# Patient Record
Sex: Female | Born: 1945 | Race: White | Hispanic: No | Smoking: Former smoker
Health system: Southern US, Community
[De-identification: ages and names within clinical notes are randomized; demographics above are authoritative.]

## PROBLEM LIST (undated history)

## (undated) DIAGNOSIS — I480 Paroxysmal atrial fibrillation: Secondary | ICD-10-CM

## (undated) DIAGNOSIS — I484 Atypical atrial flutter: Secondary | ICD-10-CM

## (undated) DIAGNOSIS — Z9889 Other specified postprocedural states: Secondary | ICD-10-CM

## (undated) DIAGNOSIS — I471 Supraventricular tachycardia, unspecified: Secondary | ICD-10-CM

## (undated) DIAGNOSIS — R928 Other abnormal and inconclusive findings on diagnostic imaging of breast: Secondary | ICD-10-CM

## (undated) DIAGNOSIS — F419 Anxiety disorder, unspecified: Secondary | ICD-10-CM

## (undated) DIAGNOSIS — I059 Rheumatic mitral valve disease, unspecified: Secondary | ICD-10-CM

## (undated) DIAGNOSIS — I639 Cerebral infarction, unspecified: Secondary | ICD-10-CM

## (undated) DIAGNOSIS — N6009 Solitary cyst of unspecified breast: Secondary | ICD-10-CM

## (undated) HISTORY — DX: Other specified postprocedural states: Z98.890

## (undated) HISTORY — DX: Paroxysmal atrial fibrillation: I48.0

## (undated) HISTORY — DX: Other abnormal and inconclusive findings on diagnostic imaging of breast: R92.8

## (undated) HISTORY — DX: Supraventricular tachycardia, unspecified: I47.10

## (undated) HISTORY — DX: Atypical atrial flutter: I48.4

## (undated) HISTORY — PX: BREAST CYST ASPIRATION: SHX578

## (undated) HISTORY — DX: Supraventricular tachycardia: I47.1

---

## 2002-07-27 HISTORY — PX: ABDOMINAL HYSTERECTOMY: SHX81

## 2004-08-27 ENCOUNTER — Ambulatory Visit: Payer: Self-pay | Admitting: Unknown Physician Specialty

## 2005-07-27 HISTORY — PX: EYE SURGERY: SHX253

## 2009-09-09 ENCOUNTER — Ambulatory Visit: Payer: Self-pay | Admitting: Ophthalmology

## 2011-07-01 ENCOUNTER — Observation Stay: Payer: Self-pay | Admitting: Internal Medicine

## 2011-08-25 ENCOUNTER — Ambulatory Visit: Payer: Self-pay | Admitting: Family Medicine

## 2012-05-28 ENCOUNTER — Emergency Department: Payer: Self-pay | Admitting: Emergency Medicine

## 2012-05-28 LAB — BASIC METABOLIC PANEL WITH GFR
Anion Gap: 8
BUN: 10 mg/dL
Calcium, Total: 9.3 mg/dL
Chloride: 103 mmol/L
Co2: 27 mmol/L
Creatinine: 0.75 mg/dL
EGFR (African American): >60
EGFR (Non-African Amer.): >60
Glucose: 131 mg/dL — ABNORMAL HIGH
Osmolality: 277
Potassium: 3.4 mmol/L — ABNORMAL LOW
Sodium: 138 mmol/L

## 2012-05-28 LAB — CK TOTAL AND CKMB (NOT AT ARMC)
CK, Total: 59 U/L
CK-MB: <0.5 ng/mL — ABNORMAL LOW

## 2012-05-28 LAB — CBC WITH DIFFERENTIAL/PLATELET
Eosinophil %: 0.1 %
HCT: 47.2 % — ABNORMAL HIGH (ref 35.0–47.0)
Lymphocyte #: 1.2 10*3/uL (ref 1.0–3.6)
MCH: 29.1 pg (ref 26.0–34.0)
MCHC: 34 g/dL (ref 32.0–36.0)
MCV: 86 fL (ref 80–100)
Monocyte #: 1.3 x10 3/mm — ABNORMAL HIGH (ref 0.2–0.9)
Monocyte %: 9.9 %
Neutrophil #: 10.4 10*3/uL — ABNORMAL HIGH (ref 1.4–6.5)
Platelet: 188 10*3/uL (ref 150–440)
RDW: 13.8 % (ref 11.5–14.5)
WBC: 12.9 10*3/uL — ABNORMAL HIGH (ref 3.6–11.0)

## 2012-05-28 LAB — TROPONIN I: Troponin-I: <0.02 ng/mL

## 2012-05-30 ENCOUNTER — Emergency Department: Payer: Self-pay | Admitting: Emergency Medicine

## 2012-05-30 LAB — CBC
HCT: 43.7 % (ref 35.0–47.0)
MCHC: 34.2 g/dL (ref 32.0–36.0)
MCV: 85 fL (ref 80–100)
RBC: 5.12 10*6/uL (ref 3.80–5.20)
WBC: 6.7 10*3/uL (ref 3.6–11.0)

## 2012-05-30 LAB — BASIC METABOLIC PANEL
Anion Gap: 8 (ref 7–16)
BUN: 11 mg/dL (ref 7–18)
Calcium, Total: 8.9 mg/dL (ref 8.5–10.1)
EGFR (African American): >60
EGFR (Non-African Amer.): >60
Glucose: 99 mg/dL (ref 65–99)
Osmolality: 284 (ref 275–301)
Sodium: 143 mmol/L (ref 136–145)

## 2012-05-30 LAB — CK TOTAL AND CKMB (NOT AT ARMC): CK, Total: 42 U/L (ref 21–215)

## 2012-07-27 DIAGNOSIS — R928 Other abnormal and inconclusive findings on diagnostic imaging of breast: Secondary | ICD-10-CM

## 2012-07-27 HISTORY — DX: Other abnormal and inconclusive findings on diagnostic imaging of breast: R92.8

## 2012-09-05 ENCOUNTER — Ambulatory Visit: Payer: Self-pay | Admitting: Internal Medicine

## 2012-09-06 ENCOUNTER — Ambulatory Visit: Payer: Self-pay | Admitting: Internal Medicine

## 2012-10-08 ENCOUNTER — Encounter: Payer: Self-pay | Admitting: General Surgery

## 2012-11-23 ENCOUNTER — Encounter: Payer: Self-pay | Admitting: *Deleted

## 2012-12-12 ENCOUNTER — Ambulatory Visit: Payer: BC Managed Care – PPO | Admitting: General Surgery

## 2013-01-03 ENCOUNTER — Ambulatory Visit (INDEPENDENT_AMBULATORY_CARE_PROVIDER_SITE_OTHER): Payer: BC Managed Care – PPO | Admitting: General Surgery

## 2013-01-03 ENCOUNTER — Ambulatory Visit: Payer: BC Managed Care – PPO | Admitting: General Surgery

## 2013-01-03 ENCOUNTER — Ambulatory Visit: Payer: Self-pay

## 2013-01-03 VITALS — BP 126/72 | HR 80 | Resp 12 | Ht 69.0 in | Wt 179.0 lb

## 2013-01-03 DIAGNOSIS — N63 Unspecified lump in unspecified breast: Secondary | ICD-10-CM

## 2013-01-03 DIAGNOSIS — N6009 Solitary cyst of unspecified breast: Secondary | ICD-10-CM

## 2013-01-03 DIAGNOSIS — N6001 Solitary cyst of right breast: Secondary | ICD-10-CM

## 2013-01-03 NOTE — Progress Notes (Signed)
Patient ID: Colleen Williamson, female   DOB: [birth year], 67 y.o.   MRN: [id number]  Chief Complaint  Patient presents with  . Other    breast check    HPI Colleen Williamson is a 67 y.o. female here today for her follow up breast check. Patient had an aspiration in the right breast three months ago. No new breast problem at this time. HPI  Past Medical History  Diagnosis Date  . Abnormal mammogram, unspecified 2014    Past Surgical History  Procedure Laterality Date  . Abdominal hysterectomy  2004  . Eye surgery  2007    No family history on file.  Social History History  Substance Use Topics  . Smoking status: Not on file  . Smokeless tobacco: Never Used  . Alcohol Use: No    Allergies  Allergen Reactions  . Sulfa Antibiotics Nausea Only    Current Outpatient Prescriptions  Medication Sig Dispense Refill  . citalopram (CELEXA) 10 MG tablet Take 10 mg by mouth daily.      . meclizine (ANTIVERT) 25 MG tablet Take 1 mg by mouth daily.      . metoprolol succinate (TOPROL-XL) 25 MG 24 hr tablet Take 25 mg by mouth daily.       No current facility-administered medications for this visit.    Review of Systems Review of Systems  Constitutional: Negative.   Respiratory: Negative.   Cardiovascular: Negative.     Blood pressure 126/72, pulse 80, resp. rate 12, height 5\' 9"  (1.753 m), weight 179 lb (81.194 kg).  Physical Exam Physical Exam  Constitutional: She appears well-developed and well-nourished.  Eyes: Conjunctivae are normal. No scleral icterus.  Neck: Neck supple.  Cardiovascular: Normal rate, regular rhythm and normal heart sounds.   Pulmonary/Chest: Right breast exhibits no inverted nipple, no mass, no nipple discharge, no skin change and no tenderness. Left breast exhibits no inverted nipple, no mass, no nipple discharge, no skin change and no tenderness.  Lymphadenopathy:    She has no cervical adenopathy.    She has no axillary adenopathy.    Data  Reviewed Ultrasound examination of the right breast in the 4:00 and 9:00 position were previous lesions were aspirated showed no evidence of recurrence.  Cytology completed the time of the September 19, 2012 exam showed no evidence of malignancy or atypia.   Assessment    Benign breast exam.     Plan    The patient should resume annual examinations and mammograms with her primary care physician in spring 2015.        Earline Mayotte 01/03/2013, 7:13 PM

## 2013-01-03 NOTE — Patient Instructions (Signed)
Patient to return as needed. 

## 2013-01-04 ENCOUNTER — Encounter: Payer: Self-pay | Admitting: General Surgery

## 2013-08-31 DIAGNOSIS — R928 Other abnormal and inconclusive findings on diagnostic imaging of breast: Secondary | ICD-10-CM

## 2013-09-20 ENCOUNTER — Ambulatory Visit: Payer: Self-pay | Admitting: Internal Medicine

## 2013-11-03 ENCOUNTER — Ambulatory Visit: Payer: Self-pay | Admitting: Internal Medicine

## 2013-11-23 ENCOUNTER — Ambulatory Visit: Payer: Self-pay | Admitting: Unknown Physician Specialty

## 2013-11-23 LAB — CBC WITH DIFFERENTIAL/PLATELET
BASOS ABS: 0.1 10*3/uL (ref 0.0–0.1)
Basophil %: 1.3 %
Eosinophil #: 0.1 10*3/uL (ref 0.0–0.7)
Eosinophil %: 2.1 %
HCT: 47.1 % — AB (ref 35.0–47.0)
HGB: 15.1 g/dL (ref 12.0–16.0)
LYMPHS ABS: 1.8 10*3/uL (ref 1.0–3.6)
LYMPHS PCT: 32.6 %
MCH: 27.7 pg (ref 26.0–34.0)
MCHC: 32 g/dL (ref 32.0–36.0)
MCV: 87 fL (ref 80–100)
MONO ABS: 0.4 x10 3/mm (ref 0.2–0.9)
Monocyte %: 7.4 %
NEUTROS ABS: 3.2 10*3/uL (ref 1.4–6.5)
NEUTROS PCT: 56.6 %
PLATELETS: 239 10*3/uL (ref 150–440)
RBC: 5.44 10*6/uL — ABNORMAL HIGH (ref 3.80–5.20)
RDW: 14.4 % (ref 11.5–14.5)
WBC: 5.6 10*3/uL (ref 3.6–11.0)

## 2013-11-23 LAB — BASIC METABOLIC PANEL
Anion Gap: 5 — ABNORMAL LOW (ref 7–16)
BUN: 11 mg/dL (ref 7–18)
CALCIUM: 9 mg/dL (ref 8.5–10.1)
Chloride: 105 mmol/L (ref 98–107)
Co2: 31 mmol/L (ref 21–32)
Creatinine: 0.57 mg/dL — ABNORMAL LOW (ref 0.60–1.30)
EGFR (Non-African Amer.): >60
GLUCOSE: 94 mg/dL (ref 65–99)
Osmolality: 280 (ref 275–301)
POTASSIUM: 3.8 mmol/L (ref 3.5–5.1)
Sodium: 141 mmol/L (ref 136–145)

## 2013-11-23 LAB — PROTIME-INR
INR: 1
PROTHROMBIN TIME: 13.2 s (ref 11.5–14.7)

## 2013-11-24 LAB — PATHOLOGY REPORT

## 2013-11-29 ENCOUNTER — Ambulatory Visit: Payer: Self-pay | Admitting: Unknown Physician Specialty

## 2013-12-11 ENCOUNTER — Ambulatory Visit: Payer: Self-pay | Admitting: Surgery

## 2013-12-11 LAB — CBC WITH DIFFERENTIAL/PLATELET
Basophil #: 0.1 10*3/uL (ref 0.0–0.1)
Basophil %: 1.4 %
Eosinophil #: 0.2 10*3/uL (ref 0.0–0.7)
Eosinophil %: 3.2 %
HCT: 44.1 % (ref 35.0–47.0)
HGB: 14.4 g/dL (ref 12.0–16.0)
LYMPHS PCT: 32.2 %
Lymphocyte #: 2 10*3/uL (ref 1.0–3.6)
MCH: 28 pg (ref 26.0–34.0)
MCHC: 32.7 g/dL (ref 32.0–36.0)
MCV: 86 fL (ref 80–100)
Monocyte #: 0.5 x10 3/mm (ref 0.2–0.9)
Monocyte %: 8.7 %
Neutrophil #: 3.4 10*3/uL (ref 1.4–6.5)
Neutrophil %: 54.5 %
Platelet: 228 10*3/uL (ref 150–440)
RBC: 5.13 10*6/uL (ref 3.80–5.20)
RDW: 14.1 % (ref 11.5–14.5)
WBC: 6.2 10*3/uL (ref 3.6–11.0)

## 2013-12-14 ENCOUNTER — Inpatient Hospital Stay: Payer: Self-pay | Admitting: Surgery

## 2013-12-15 LAB — PATHOLOGY REPORT

## 2013-12-16 LAB — PLATELET COUNT: Platelet: 189 10*3/uL (ref 150–440)

## 2014-04-12 DIAGNOSIS — I4892 Unspecified atrial flutter: Secondary | ICD-10-CM | POA: Diagnosis present

## 2014-04-20 DIAGNOSIS — M79609 Pain in unspecified limb: Secondary | ICD-10-CM | POA: Insufficient documentation

## 2014-04-25 ENCOUNTER — Ambulatory Visit: Payer: Self-pay | Admitting: Internal Medicine

## 2014-05-14 DIAGNOSIS — I480 Paroxysmal atrial fibrillation: Secondary | ICD-10-CM | POA: Insufficient documentation

## 2014-05-27 HISTORY — PX: MITRAL VALVE REPAIR: SHX2039

## 2014-05-28 ENCOUNTER — Encounter: Payer: Self-pay | Admitting: *Deleted

## 2014-06-13 DIAGNOSIS — I341 Nonrheumatic mitral (valve) prolapse: Secondary | ICD-10-CM | POA: Insufficient documentation

## 2014-07-03 ENCOUNTER — Inpatient Hospital Stay (HOSPITAL_COMMUNITY)
Admission: EM | Admit: 2014-07-03 | Discharge: 2014-07-09 | DRG: 023 | Disposition: A | Discharge status: Home Health | Payer: Medicare HMO | Attending: Neurology | Admitting: Neurology

## 2014-07-03 ENCOUNTER — Emergency Department (HOSPITAL_COMMUNITY): Payer: Medicare HMO

## 2014-07-03 ENCOUNTER — Emergency Department (HOSPITAL_COMMUNITY): Payer: Medicare HMO | Admitting: Anesthesiology

## 2014-07-03 ENCOUNTER — Inpatient Hospital Stay (HOSPITAL_COMMUNITY): Payer: Medicare HMO

## 2014-07-03 ENCOUNTER — Encounter (HOSPITAL_COMMUNITY)
Admission: EM | Disposition: A | Discharge status: Home Health | Payer: Self-pay | Source: Home / Self Care | Attending: Neurology

## 2014-07-03 ENCOUNTER — Encounter (HOSPITAL_COMMUNITY): Payer: Self-pay | Admitting: Emergency Medicine

## 2014-07-03 DIAGNOSIS — I482 Chronic atrial fibrillation: Secondary | ICD-10-CM

## 2014-07-03 DIAGNOSIS — Z7982 Long term (current) use of aspirin: Secondary | ICD-10-CM

## 2014-07-03 DIAGNOSIS — R4701 Aphasia: Secondary | ICD-10-CM | POA: Diagnosis present

## 2014-07-03 DIAGNOSIS — Z978 Presence of other specified devices: Secondary | ICD-10-CM

## 2014-07-03 DIAGNOSIS — I639 Cerebral infarction, unspecified: Secondary | ICD-10-CM | POA: Diagnosis present

## 2014-07-03 DIAGNOSIS — J9690 Respiratory failure, unspecified, unspecified whether with hypoxia or hypercapnia: Secondary | ICD-10-CM

## 2014-07-03 DIAGNOSIS — I059 Rheumatic mitral valve disease, unspecified: Secondary | ICD-10-CM

## 2014-07-03 DIAGNOSIS — J9600 Acute respiratory failure, unspecified whether with hypoxia or hypercapnia: Secondary | ICD-10-CM | POA: Diagnosis not present

## 2014-07-03 DIAGNOSIS — W19XXXA Unspecified fall, initial encounter: Secondary | ICD-10-CM | POA: Diagnosis present

## 2014-07-03 DIAGNOSIS — Z87891 Personal history of nicotine dependence: Secondary | ICD-10-CM

## 2014-07-03 DIAGNOSIS — I480 Paroxysmal atrial fibrillation: Secondary | ICD-10-CM | POA: Diagnosis present

## 2014-07-03 DIAGNOSIS — E162 Hypoglycemia, unspecified: Secondary | ICD-10-CM | POA: Diagnosis not present

## 2014-07-03 DIAGNOSIS — G8191 Hemiplegia, unspecified affecting right dominant side: Secondary | ICD-10-CM | POA: Diagnosis present

## 2014-07-03 DIAGNOSIS — E875 Hyperkalemia: Secondary | ICD-10-CM | POA: Diagnosis present

## 2014-07-03 DIAGNOSIS — I4892 Unspecified atrial flutter: Secondary | ICD-10-CM | POA: Diagnosis present

## 2014-07-03 DIAGNOSIS — F329 Major depressive disorder, single episode, unspecified: Secondary | ICD-10-CM | POA: Diagnosis present

## 2014-07-03 DIAGNOSIS — J9601 Acute respiratory failure with hypoxia: Secondary | ICD-10-CM

## 2014-07-03 DIAGNOSIS — H53461 Homonymous bilateral field defects, right side: Secondary | ICD-10-CM | POA: Diagnosis present

## 2014-07-03 DIAGNOSIS — I4891 Unspecified atrial fibrillation: Secondary | ICD-10-CM

## 2014-07-03 DIAGNOSIS — I63412 Cerebral infarction due to embolism of left middle cerebral artery: Principal | ICD-10-CM | POA: Diagnosis present

## 2014-07-03 DIAGNOSIS — Z0000 Encounter for general adult medical examination without abnormal findings: Secondary | ICD-10-CM

## 2014-07-03 DIAGNOSIS — I10 Essential (primary) hypertension: Secondary | ICD-10-CM | POA: Diagnosis present

## 2014-07-03 DIAGNOSIS — I63032 Cerebral infarction due to thrombosis of left carotid artery: Secondary | ICD-10-CM

## 2014-07-03 DIAGNOSIS — Z952 Presence of prosthetic heart valve: Secondary | ICD-10-CM

## 2014-07-03 DIAGNOSIS — I63312 Cerebral infarction due to thrombosis of left middle cerebral artery: Secondary | ICD-10-CM

## 2014-07-03 HISTORY — DX: Solitary cyst of unspecified breast: N60.09

## 2014-07-03 HISTORY — DX: Cerebral infarction, unspecified: I63.9

## 2014-07-03 HISTORY — PX: RADIOLOGY WITH ANESTHESIA: SHX6223

## 2014-07-03 LAB — COMPREHENSIVE METABOLIC PANEL
ALT: 14 U/L (ref 0–35)
ANION GAP: 14 (ref 5–15)
AST: 20 U/L (ref 0–37)
Albumin: 3.7 g/dL (ref 3.5–5.2)
Alkaline Phosphatase: 120 U/L — ABNORMAL HIGH (ref 39–117)
BUN: 15 mg/dL (ref 6–23)
CALCIUM: 9.3 mg/dL (ref 8.4–10.5)
CO2: 22 mEq/L (ref 19–32)
Chloride: 105 mEq/L (ref 96–112)
Creatinine, Ser: 0.66 mg/dL (ref 0.50–1.10)
GFR calc Af Amer: >90 mL/min (ref >90)
GFR calc non Af Amer: >90 mL/min (ref >90)
Glucose, Bld: 103 mg/dL — ABNORMAL HIGH (ref 70–99)
Potassium: 3.8 mEq/L (ref 3.7–5.3)
Sodium: 141 mEq/L (ref 137–147)
Total Bilirubin: 1.1 mg/dL (ref 0.3–1.2)
Total Protein: 6.6 g/dL (ref 6.0–8.3)

## 2014-07-03 LAB — CBC
HEMATOCRIT: 37.1 % (ref 36.0–46.0)
HEMOGLOBIN: 11.9 g/dL — AB (ref 12.0–15.0)
MCH: 26.7 pg (ref 26.0–34.0)
MCHC: 32.1 g/dL (ref 30.0–36.0)
MCV: 83.4 fL (ref 78.0–100.0)
Platelets: 201 10*3/uL (ref 150–400)
RBC: 4.45 MIL/uL (ref 3.87–5.11)
RDW: 14 % (ref 11.5–15.5)
WBC: 5.9 10*3/uL (ref 4.0–10.5)

## 2014-07-03 LAB — I-STAT TROPONIN, ED: TROPONIN I, POC: 0 ng/mL (ref 0.00–0.08)

## 2014-07-03 LAB — RAPID URINE DRUG SCREEN, HOSP PERFORMED
Amphetamines: NOT DETECTED
Barbiturates: NOT DETECTED
Benzodiazepines: NOT DETECTED
COCAINE: NOT DETECTED
Opiates: NOT DETECTED
TETRAHYDROCANNABINOL: NOT DETECTED

## 2014-07-03 LAB — POCT I-STAT 7, (LYTES, BLD GAS, ICA,H+H)
Acid-base deficit: 5 mmol/L — ABNORMAL HIGH (ref 0.0–2.0)
Bicarbonate: 19.4 mEq/L — ABNORMAL LOW (ref 20.0–24.0)
Calcium, Ion: 1.12 mmol/L — ABNORMAL LOW (ref 1.13–1.30)
HEMATOCRIT: 29 % — AB (ref 36.0–46.0)
HEMOGLOBIN: 9.9 g/dL — AB (ref 12.0–15.0)
O2 Saturation: 98 %
PH ART: 7.378 (ref 7.350–7.450)
PO2 ART: 110 mmHg — AB (ref 80.0–100.0)
Potassium: 2.9 mEq/L — CL (ref 3.7–5.3)
SODIUM: 146 meq/L (ref 137–147)
TCO2: 20 mmol/L (ref 0–100)
pCO2 arterial: 33 mmHg — ABNORMAL LOW (ref 35.0–45.0)

## 2014-07-03 LAB — I-STAT CHEM 8, ED
BUN: 14 mg/dL (ref 6–23)
Calcium, Ion: 1.18 mmol/L (ref 1.13–1.30)
Chloride: 108 mEq/L (ref 96–112)
Creatinine, Ser: 0.7 mg/dL (ref 0.50–1.10)
Glucose, Bld: 104 mg/dL — ABNORMAL HIGH (ref 70–99)
HCT: 39 % (ref 36.0–46.0)
HEMOGLOBIN: 13.3 g/dL (ref 12.0–15.0)
Potassium: 3.7 mEq/L (ref 3.7–5.3)
Sodium: 142 mEq/L (ref 137–147)
TCO2: 21 mmol/L (ref 0–100)

## 2014-07-03 LAB — URINALYSIS, ROUTINE W REFLEX MICROSCOPIC
BILIRUBIN URINE: NEGATIVE
Glucose, UA: NEGATIVE mg/dL
HGB URINE DIPSTICK: NEGATIVE
Ketones, ur: NEGATIVE mg/dL
Leukocytes, UA: NEGATIVE
Nitrite: NEGATIVE
PH: 8 (ref 5.0–8.0)
Protein, ur: 100 mg/dL — AB
Specific Gravity, Urine: 1.039 — ABNORMAL HIGH (ref 1.005–1.030)
UROBILINOGEN UA: 0.2 mg/dL (ref 0.0–1.0)

## 2014-07-03 LAB — BASIC METABOLIC PANEL
Anion gap: 13 (ref 5–15)
BUN: 12 mg/dL (ref 6–23)
CALCIUM: 8.9 mg/dL (ref 8.4–10.5)
CO2: 22 mEq/L (ref 19–32)
CREATININE: 0.58 mg/dL (ref 0.50–1.10)
Chloride: 108 mEq/L (ref 96–112)
GFR calc Af Amer: >90 mL/min (ref >90)
GLUCOSE: 98 mg/dL (ref 70–99)
POTASSIUM: 3.5 meq/L — AB (ref 3.7–5.3)
Sodium: 143 mEq/L (ref 137–147)

## 2014-07-03 LAB — PHOSPHORUS: PHOSPHORUS: 3.2 mg/dL (ref 2.3–4.6)

## 2014-07-03 LAB — DIFFERENTIAL
BASOS ABS: 0.1 10*3/uL (ref 0.0–0.1)
BASOS PCT: 1 % (ref 0–1)
EOS PCT: 7 % — AB (ref 0–5)
Eosinophils Absolute: 0.4 10*3/uL (ref 0.0–0.7)
Lymphocytes Relative: 25 % (ref 12–46)
Lymphs Abs: 1.5 10*3/uL (ref 0.7–4.0)
MONO ABS: 0.4 10*3/uL (ref 0.1–1.0)
MONOS PCT: 7 % (ref 3–12)
Neutro Abs: 3.5 10*3/uL (ref 1.7–7.7)
Neutrophils Relative %: 60 % (ref 43–77)

## 2014-07-03 LAB — PROTIME-INR
INR: 1.07 (ref 0.00–1.49)
Prothrombin Time: 14 seconds (ref 11.6–15.2)

## 2014-07-03 LAB — GLUCOSE, CAPILLARY
Glucose-Capillary: 100 mg/dL — ABNORMAL HIGH (ref 70–99)
Glucose-Capillary: 95 mg/dL (ref 70–99)
Glucose-Capillary: 83 mg/dL (ref 70–99)

## 2014-07-03 LAB — URINE MICROSCOPIC-ADD ON

## 2014-07-03 LAB — CBG MONITORING, ED: GLUCOSE-CAPILLARY: 108 mg/dL — AB (ref 70–99)

## 2014-07-03 LAB — ETHANOL

## 2014-07-03 LAB — APTT: aPTT: 26 seconds (ref 24–37)

## 2014-07-03 LAB — TROPONIN I
Troponin I: <0.3 ng/mL (ref <0.30)
Troponin I: <0.3 ng/mL (ref <0.30)

## 2014-07-03 LAB — MAGNESIUM: Magnesium: 2 mg/dL (ref 1.5–2.5)

## 2014-07-03 LAB — MRSA PCR SCREENING: MRSA by PCR: NEGATIVE

## 2014-07-03 LAB — TRIGLYCERIDES: TRIGLYCERIDES: 148 mg/dL (ref <150)

## 2014-07-03 SURGERY — RADIOLOGY WITH ANESTHESIA
Anesthesia: General

## 2014-07-03 MED ORDER — SODIUM CHLORIDE 0.9 % IV SOLN
10.0000 mg | INTRAVENOUS | Status: DC | PRN
  Administered 2014-07-03: 25 ug/min via INTRAVENOUS

## 2014-07-03 MED ORDER — PROPOFOL 10 MG/ML IV EMUL
0.0000 ug/kg/min | INTRAVENOUS | Status: DC
  Administered 2014-07-03 (×2): 50 ug/kg/min via INTRAVENOUS
  Administered 2014-07-03: 40 ug/kg/min via INTRAVENOUS
  Administered 2014-07-03: 1000 mg via INTRAVENOUS
  Administered 2014-07-04: 10 ug/kg/min via INTRAVENOUS
  Administered 2014-07-04 (×3): 50 ug/kg/min via INTRAVENOUS
  Administered 2014-07-05: 10 ug/kg/min via INTRAVENOUS
  Filled 2014-07-03 (×8): qty 100

## 2014-07-03 MED ORDER — HEPARIN SODIUM (PORCINE) 1000 UNIT/ML IJ SOLN
INTRAMUSCULAR | Status: DC | PRN
  Administered 2014-07-03: 2000 [IU] via INTRAVENOUS

## 2014-07-03 MED ORDER — POTASSIUM CHLORIDE 20 MEQ/15ML (10%) PO SOLN
40.0000 meq | ORAL | Status: AC
  Administered 2014-07-03 (×2): 40 meq via ORAL
  Filled 2014-07-03 (×4): qty 30

## 2014-07-03 MED ORDER — ONDANSETRON HCL 4 MG/2ML IJ SOLN: 4.0000 mg | Freq: Four times a day (QID) | INTRAMUSCULAR | Status: DC | PRN

## 2014-07-03 MED ORDER — EPTIFIBATIDE 2 MG/ML IV SOLN
INTRAVENOUS | Status: AC
  Filled 2014-07-03: qty 10

## 2014-07-03 MED ORDER — IOHEXOL 350 MG/ML SOLN
50.0000 mL | Freq: Once | INTRAVENOUS | Status: AC | PRN
  Administered 2014-07-03: 50 mL via INTRAVENOUS

## 2014-07-03 MED ORDER — SUCCINYLCHOLINE CHLORIDE 20 MG/ML IJ SOLN
INTRAMUSCULAR | Status: DC | PRN
  Administered 2014-07-03: 100 mg via INTRAVENOUS

## 2014-07-03 MED ORDER — POTASSIUM CHLORIDE 20 MEQ PO PACK
40.0000 meq | PACK | ORAL | Status: DC
Start: 2014-07-03 — End: 2014-07-03

## 2014-07-03 MED ORDER — ACETAMINOPHEN 650 MG RE SUPP
650.0000 mg | Freq: Four times a day (QID) | RECTAL | Status: DC | PRN
  Administered 2014-07-04: 650 mg via RECTAL
  Filled 2014-07-03: qty 1

## 2014-07-03 MED ORDER — NITROGLYCERIN 1 MG/10 ML FOR IR/CATH LAB
INTRA_ARTERIAL | Status: AC
  Filled 2014-07-03: qty 10

## 2014-07-03 MED ORDER — LACTATED RINGERS IV SOLN
INTRAVENOUS | Status: DC | PRN
  Administered 2014-07-03: 09:00:00 via INTRAVENOUS

## 2014-07-03 MED ORDER — FENTANYL CITRATE 0.05 MG/ML IJ SOLN
INTRAMUSCULAR | Status: AC
  Filled 2014-07-03: qty 2

## 2014-07-03 MED ORDER — EPTIFIBATIDE 2 MG/ML IV SOLN
INTRAVENOUS | Status: AC
  Administered 2014-07-03 (×3): 2 mg
  Filled 2014-07-03: qty 10

## 2014-07-03 MED ORDER — SODIUM CHLORIDE 0.9 % IV SOLN
INTRAVENOUS | Status: DC
  Administered 2014-07-03 (×2): via INTRAVENOUS

## 2014-07-03 MED ORDER — PROPOFOL 10 MG/ML IV BOLUS
INTRAVENOUS | Status: DC | PRN
  Administered 2014-07-03: 50 mg via INTRAVENOUS
  Administered 2014-07-03: 25 mg via INTRAVENOUS
  Administered 2014-07-03: 125 mg via INTRAVENOUS

## 2014-07-03 MED ORDER — IOHEXOL 300 MG/ML  SOLN
150.0000 mL | Freq: Once | INTRAMUSCULAR | Status: AC | PRN
  Administered 2014-07-03: 150 mL via INTRA_ARTERIAL

## 2014-07-03 MED ORDER — ACETAMINOPHEN 500 MG PO TABS: 1000.0000 mg | ORAL_TABLET | Freq: Four times a day (QID) | ORAL | Status: DC | PRN

## 2014-07-03 MED ORDER — ROCURONIUM BROMIDE 100 MG/10ML IV SOLN
INTRAVENOUS | Status: DC | PRN
  Administered 2014-07-03: 40 mg via INTRAVENOUS

## 2014-07-03 MED ORDER — MIDAZOLAM HCL 2 MG/2ML IJ SOLN
INTRAMUSCULAR | Status: AC
  Filled 2014-07-03: qty 2

## 2014-07-03 MED ORDER — FENTANYL CITRATE 0.05 MG/ML IJ SOLN
INTRAMUSCULAR | Status: DC
Start: 2014-07-03 — End: 2014-07-03
  Filled 2014-07-03: qty 2

## 2014-07-03 MED ORDER — SODIUM CHLORIDE 0.9 % IV SOLN
INTRAVENOUS | Status: DC | PRN
  Administered 2014-07-03: 09:00:00 via INTRAVENOUS

## 2014-07-03 MED ORDER — NICARDIPINE HCL IN NACL 20-0.86 MG/200ML-% IV SOLN
5.0000 mg/h | INTRAVENOUS | Status: DC
Start: 2014-07-03 — End: 2014-07-06
  Administered 2014-07-03: 5 mg/h via INTRAVENOUS
  Filled 2014-07-03: qty 200

## 2014-07-03 MED ORDER — CETYLPYRIDINIUM CHLORIDE 0.05 % MT LIQD
7.0000 mL | Freq: Four times a day (QID) | OROMUCOSAL | Status: DC
  Administered 2014-07-04 – 2014-07-06 (×9): 7 mL via OROMUCOSAL

## 2014-07-03 MED ORDER — STROKE: EARLY STAGES OF RECOVERY BOOK
Freq: Once | Status: AC
  Administered 2014-07-03: 12:00:00
  Filled 2014-07-03: qty 1

## 2014-07-03 MED ORDER — LIDOCAINE HCL (CARDIAC) 20 MG/ML IV SOLN
INTRAVENOUS | Status: DC | PRN
  Administered 2014-07-03: 50 mg via INTRAVENOUS

## 2014-07-03 MED ORDER — CHLORHEXIDINE GLUCONATE 0.12 % MT SOLN
15.0000 mL | Freq: Two times a day (BID) | OROMUCOSAL | Status: DC
  Administered 2014-07-03 – 2014-07-06 (×5): 15 mL via OROMUCOSAL
  Filled 2014-07-03 (×4): qty 15

## 2014-07-03 MED ORDER — FENTANYL CITRATE 0.05 MG/ML IJ SOLN
50.0000 ug | INTRAMUSCULAR | Status: DC | PRN
  Administered 2014-07-04: 50 ug via INTRAVENOUS
  Filled 2014-07-03: qty 2

## 2014-07-03 MED ORDER — ARTIFICIAL TEARS OP OINT
TOPICAL_OINTMENT | OPHTHALMIC | Status: DC | PRN
  Administered 2014-07-03: 1 via OPHTHALMIC

## 2014-07-03 MED ORDER — FENTANYL CITRATE 0.05 MG/ML IJ SOLN
INTRAMUSCULAR | Status: DC | PRN
  Administered 2014-07-03: 100 ug via INTRAVENOUS
  Administered 2014-07-03: 50 ug via INTRAVENOUS
  Administered 2014-07-03: 100 ug via INTRAVENOUS

## 2014-07-03 MED ORDER — PROPOFOL 10 MG/ML IV EMUL
INTRAVENOUS | Status: AC
  Administered 2014-07-03: 1000 mg via INTRAVENOUS
  Filled 2014-07-03: qty 100

## 2014-07-03 NOTE — ED Notes (Signed)
Dr. Nicole Kindred (Neuro) at bedside speaking with family

## 2014-07-03 NOTE — Consult Note (Signed)
Name: Colleen Williamson MRN: [id number] DOB: 10-22-1945    ADMISSION DATE:  07/03/2014 CONSULTATION DATE:  07/03/14  REFERRING MD :  Dr Nicole Kindred   CHIEF COMPLAINT:  Respiratory failure  BRIEF PATIENT DESCRIPTION:  68 yo woman, hx A Fib, MVR on 06/11/14 for MVP.  Admitted with L MCA CVA am 12/8. To IR for angiogram and revascularization. To ICU on MV.   SIGNIFICANT EVENTS  11/16 MVR [facility]) 12/8 L MCA CVA 12/8 s/p cerebral angio + revascularization    STUDIES:  CT head 12/08:  large area L MCA territory ischemia    HISTORY OF PRESENT ILLNESS:   All history obtained from records as pt is intubated and no family is availablr. 68 yo woman, hx A Fib, MVR on 06/11/14 for MVP.  Admitted with L MCA CVA am 12/8. To IR for angiogram and revascularization. To ICU on MV.  PAST MEDICAL HISTORY :   has a past medical history of Abnormal mammogram, unspecified (2014); Breast cyst; and Stroke (07/03/2014).  has past surgical history that includes Abdominal hysterectomy (2004); Eye surgery (2007); and Mitral valve repair (05/2014). Prior to Admission medications   Medication Sig Start Date End Date Taking? Authorizing Provider  citalopram (CELEXA) 10 MG tablet Take 10 mg by mouth daily.    Historical Provider, MD  meclizine (ANTIVERT) 25 MG tablet Take 1 mg by mouth daily. 12/02/12   Historical Provider, MD  metoprolol succinate (TOPROL-XL) 25 MG 24 hr tablet Take 25 mg by mouth daily.    Historical Provider, MD   Allergies  Allergen Reactions  . Sulfa Antibiotics Nausea Only    FAMILY HISTORY:  family history is not on file. SOCIAL HISTORY:  reports that she has quit smoking. She has never used smokeless tobacco. She reports that she does not drink alcohol or use illicit drugs.  REVIEW OF SYSTEMS:   Unable to obtain  SUBJECTIVE:  Unable to obtain  VITAL SIGNS: Temp:  [96.3 F (35.7 C)-99.3 F (37.4 C)] 99.3 F (37.4 C) (12/08 1500) Pulse Rate:  [62-77] 73 (12/08 1500) Resp:  [12-19]  19 (12/08 1500) BP: (128)/(78) 128/78 mmHg (12/08 0845) SpO2:  [100 %] 100 % (12/08 1200) Arterial Line BP: (114-131)/(67-74) 131/74 mmHg (12/08 1500) Weight:  [85 kg (187 lb 6.3 oz)] 85 kg (187 lb 6.3 oz) (12/08 0834)  PHYSICAL EXAMINATION: General:  RASS -3, intubated Neuro:  R hemiplegia, withdraws RUE and RLE, Babinski normal on R, absent on L HEENT: NCAT, PERRL Cardiovascular: Freq extrasystoles, + syst M Lungs: Clear Abdomen:  Soft, NT, +BS Ext: no edema, warm   Recent Labs Lab 07/03/14 0800 07/03/14 0808  NA 141 142  K 3.8 3.7  CL 105 108  CO2 22  --   BUN 15 14  CREATININE 0.66 0.70  GLUCOSE 103* 104*    Recent Labs Lab 07/03/14 0800 07/03/14 0808  HGB 11.9* 13.3  HCT 37.1 39.0  WBC 5.9  --   PLT 201  --    CXR: small R>L effusions  ASSESSMENT / PLAN: 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 Recent MVR 11/16 Paroxysmal A fib - apparently not on anticoagulation (INR normal on admission) Acute respiratory failure due to neurological event, altered MS HTN, controlled on nicardipine gtt H/O Depression  Plans:  Full vent support - settings reviewed and/or adjusted Cont vent bundle Daily SBT if/when meets criteria Cont nicardipine - need to clarify BP goals with Neurology Monitor BMET intermittently Monitor I/Os Correct electrolytes as indicated Cont  Propofol gtt Daily WUA Further eval and mgmt of CVA per Neurology    Merton Border, MD ; [facility] service Mobile [phone].  After 5:30 PM or weekends, call [phone]  Pulmonary and [place] Pager: [phone] 07/03/2014, 4:25 PM

## 2014-07-03 NOTE — Sedation Documentation (Signed)
1st stick in IR [id number] [phone]

## 2014-07-03 NOTE — Progress Notes (Signed)
Revascularization time 1006

## 2014-07-03 NOTE — Progress Notes (Signed)
Stroke education booklet received and given to daughter at bedside. Education provided to family at bedside and questions and concerns addressed.

## 2014-07-03 NOTE — ED Notes (Signed)
Pt taken to CT; Suezanne Jacquet, RN and stroke team at bedside

## 2014-07-03 NOTE — ED Notes (Signed)
Arrives via [facility] from home, family heard pt fall in bathroom, was unresponsive, per EMS was awake but unable to speak and was unable to move right side, CBG 109, VSS, 18g L AC, pt meet at bridge by RR RN, ED RN and taken to CT, labs drawn, EDP eval at bridge

## 2014-07-03 NOTE — Procedures (Signed)
S/P Rt vert arteriogram,and bilateral common carotid arteriograms followed by complete revascularization of occluded Lt MCA M1 segment Using x1 pass with the TREVOPROVUE 53mm x 30 mm retrieval device and 3mg  of of IA Integrelin

## 2014-07-03 NOTE — ED Notes (Signed)
IR RNs here to take pt to IR for procedure

## 2014-07-03 NOTE — Progress Notes (Signed)
CRITICAL VALUE ALERT  Critical value received:  Potassium 2.9  Date of notification:  07/03/2014  Time of notification:  1955  Critical value read back:Yes.    Nurse who received alert:  Corinda Gubler  MD notified (1st page): Corliss Skains RN   Time of first page:  1956

## 2014-07-03 NOTE — Anesthesia Preprocedure Evaluation (Addendum)
Anesthesia Evaluation  Patient identified by MRN, date of birth, ID band Patient confused    Reviewed: Allergy & Precautions, H&P Preop documentation limited or incomplete due to emergent nature of procedure.  Airway Mallampati: III   Neck ROM: Full    Dental  (+) Teeth Intact   Pulmonary  breath sounds clear to auscultation        Cardiovascular Rhythm:Regular     Neuro/Psych    GI/Hepatic   Endo/Other    Renal/GU      Musculoskeletal   Abdominal (+)  Abdomen: soft.    Peds  Hematology   Anesthesia Other Findings   Reproductive/Obstetrics                            Anesthesia Physical Anesthesia Plan  ASA: III and emergent  Anesthesia Plan: General   Post-op Pain Management:    Induction: Intravenous  Airway Management Planned: Oral ETT  Additional Equipment: Arterial line  Intra-op Plan:   Post-operative Plan:   Informed Consent: I have reviewed the patients History and Physical, chart, labs and discussed the procedure including the risks, benefits and alternatives for the proposed anesthesia with the patient or authorized representative who has indicated his/her understanding and acceptance.     Plan Discussed with:   Anesthesia Plan Comments:         Anesthesia Quick Evaluation

## 2014-07-03 NOTE — ED Notes (Signed)
Pt has returned from being out of the department 

## 2014-07-03 NOTE — H&P (Signed)
Admission H&P    Chief Complaint: New onset A. fib and right-sided weakness.  [name] is an 68 y.o. female history of mitral valve disease and mitral valve replacement on 06/16/2014, as well as intermittent atrial fibrillation not on anticoagulation, presenting with new onset aphasia and right hemiplegia. There is no previous history of stroke nor TIA. Patient has been on aspirin daily. Exact time of onset is unclear. Patient was last seen well at 10 PM last night. Her family heard her fall this morning in the bathroom at 7 AM today. She was unable to speak and unable to move her right side. CT scan of her head showed decreased gray-white differentiation along the left insular cortex. CT perfusion study showed a large area of left MCA territory ischemia reduced mean transit time and reduce cerebral flow. Blood volume however was mostly preserved. Patient was taken to interventional radiology for further evaluation. Cerebral angiogram showed left MCA M1 occlusion. NIH stroke score was 25.  LSN: 10 PM on 07/02/2014 tPA Given: No: Open heart surgery with mitral valve replacement on 06/16/2014; beyond time window for TPA, based on time last seen well by family. mRankin:  Past Medical History  Diagnosis Date  . Abnormal mammogram, unspecified 2014    Past Surgical History  Procedure Laterality Date  . Abdominal hysterectomy  2004  . Eye surgery  2007   Family history: Unavailable.  Social History:  does not have a smoking history on file. She has never used smokeless tobacco. She reports that she does not drink alcohol or use illicit drugs.  Allergies:  Allergies  Allergen Reactions  . Sulfa Antibiotics Nausea Only    Medications: Patient's medications prior to admission were reviewed by me.  ROS: History obtained from patient's daughters  General ROS: negative for - chills, fatigue, fever, night sweats, weight gain or weight loss Psychological ROS: negative for -  behavioral disorder, hallucinations, memory difficulties, mood swings or suicidal ideation Ophthalmic ROS: negative for - blurry vision, double vision, eye pain or loss of vision ENT ROS: negative for - epistaxis, nasal discharge, oral lesions, sore throat, tinnitus or vertigo Allergy and Immunology ROS: negative for - hives or itchy/watery eyes Hematological and Lymphatic ROS: negative for - bleeding problems, bruising or swollen lymph nodes Endocrine ROS: negative for - galactorrhea, hair pattern changes, polydipsia/polyuria or temperature intolerance Respiratory ROS: negative for - cough, hemoptysis, shortness of breath or wheezing Cardiovascular ROS: No known postop complications following mitral valve replacement Gastrointestinal ROS: negative for - abdominal pain, diarrhea, hematemesis, nausea/vomiting or stool incontinence Genito-Urinary ROS: negative for - dysuria, hematuria, incontinence or urinary frequency/urgency Musculoskeletal ROS: negative for - joint swelling or muscular weakness Neurological ROS: as noted in HPI Dermatological ROS: negative for rash and skin lesion changes  Physical Examination: Blood pressure 128/78, pulse 74, resp. rate 18, weight 85 kg (187 lb 6.3 oz), SpO2 100 %.  HEENT-  Normocephalic, no lesions, without obvious abnormality.  Normal external eye and conjunctiva.  Normal TM's bilaterally.  Normal auditory canals and external ears. Normal external nose, mucus membranes and septum.  Normal pharynx. Neck supple with no masses, nodes, nodules or enlargement. Cardiovascular - regular rate and rhythm, S1, S2 normal, no murmur, click, rub or gallop Lungs - chest clear, no wheezing, rales, normal symmetric air entry Abdomen - soft, non-tender; bowel sounds normal; no masses,  no organomegaly Extremities - no joint deformities, effusion, or inflammation, no edema and no skin discoloration  Neurologic Examination: Mental Status:  Lethargic, with no speech  output, as well as inability to follow commands. She appear to be globally aphasic. Cranial Nerves: Dense right homonymous hemianopsia. III/IV/VI-Pupils were equal and reacted. Conjugate gaze deviation to the left side with no movement of eyes beyond midline toward the right.    V/VII-neglect of right side; moderate right lower facial weakness. VIII-normal. X-no speech output including no phonation. Motor: Dense right hemiplegia with moderate to severe increase in muscle tone of extremities; normal strength and tone of left extremities. Sensory: Complete neglect of right side. Deep Tendon Reflexes: Asymmetric DTRs with 2+ and brisk responses of left extremities and 3+ responses of right extremities. Plantars: Flexor bilaterally Cerebellar: Unable to adequately assess due to severe aphasia. Carotid auscultation: Normal  Results for orders placed or performed during the hospital encounter of 07/03/14 (from the past 48 hour(s))  Ethanol     Status: None   Collection Time: 07/03/14  8:00 AM  Result Value Ref Range   Alcohol, Ethyl (B) <11 0 - 11 mg/dL    Comment:        LOWEST DETECTABLE LIMIT FOR SERUM ALCOHOL IS 11 mg/dL FOR MEDICAL PURPOSES ONLY   Protime-INR     Status: None   Collection Time: 07/03/14  8:00 AM  Result Value Ref Range   Prothrombin Time 14.0 11.6 - 15.2 seconds   INR 1.07 0.00 - 1.49  APTT     Status: None   Collection Time: 07/03/14  8:00 AM  Result Value Ref Range   aPTT 26 24 - 37 seconds  CBC     Status: Abnormal   Collection Time: 07/03/14  8:00 AM  Result Value Ref Range   WBC 5.9 4.0 - 10.5 K/uL   RBC 4.45 3.87 - 5.11 MIL/uL   Hemoglobin 11.9 (L) 12.0 - 15.0 g/dL   HCT 37.1 36.0 - 46.0 %   MCV 83.4 78.0 - 100.0 fL   MCH 26.7 26.0 - 34.0 pg   MCHC 32.1 30.0 - 36.0 g/dL   RDW 14.0 11.5 - 15.5 %   Platelets 201 150 - 400 K/uL  Differential     Status: Abnormal   Collection Time: 07/03/14  8:00 AM  Result Value Ref Range   Neutrophils Relative % 60  43 - 77 %   Neutro Abs 3.5 1.7 - 7.7 K/uL   Lymphocytes Relative 25 12 - 46 %   Lymphs Abs 1.5 0.7 - 4.0 K/uL   Monocytes Relative 7 3 - 12 %   Monocytes Absolute 0.4 0.1 - 1.0 K/uL   Eosinophils Relative 7 (H) 0 - 5 %   Eosinophils Absolute 0.4 0.0 - 0.7 K/uL   Basophils Relative 1 0 - 1 %   Basophils Absolute 0.1 0.0 - 0.1 K/uL  Comprehensive metabolic panel     Status: Abnormal   Collection Time: 07/03/14  8:00 AM  Result Value Ref Range   Sodium 141 137 - 147 mEq/L   Potassium 3.8 3.7 - 5.3 mEq/L   Chloride 105 96 - 112 mEq/L   CO2 22 19 - 32 mEq/L   Glucose, Bld 103 (H) 70 - 99 mg/dL   BUN 15 6 - 23 mg/dL   Creatinine, Ser 0.66 0.50 - 1.10 mg/dL   Calcium 9.3 8.4 - 10.5 mg/dL   Total Protein 6.6 6.0 - 8.3 g/dL   Albumin 3.7 3.5 - 5.2 g/dL   AST 20 0 - 37 U/L   ALT 14 0 - 35 U/L  Alkaline Phosphatase 120 (H) 39 - 117 U/L   Total Bilirubin 1.1 0.3 - 1.2 mg/dL   GFR calc non Af Amer >90 >90 mL/min   GFR calc Af Amer >90 >90 mL/min    Comment: (NOTE) The eGFR has been calculated using the CKD EPI equation. This calculation has not been validated in all clinical situations. eGFR's persistently <90 mL/min signify possible Chronic Kidney Disease.    Anion gap 14 5 - 15  I-Stat Troponin, ED (not at [facility])     Status: None   Collection Time: 07/03/14  8:05 AM  Result Value Ref Range   Troponin i, poc 0.00 0.00 - 0.08 ng/mL   Comment 3            Comment: Due to the release kinetics of cTnI, a negative result within the first hours of the onset of symptoms does not rule out myocardial infarction with certainty. If myocardial infarction is still suspected, repeat the test at appropriate intervals.   I-Stat Chem 8, ED     Status: Abnormal   Collection Time: 07/03/14  8:08 AM  Result Value Ref Range   Sodium 142 137 - 147 mEq/L   Potassium 3.7 3.7 - 5.3 mEq/L   Chloride 108 96 - 112 mEq/L   BUN 14 6 - 23 mg/dL   Creatinine, Ser 0.70 0.50 - 1.10 mg/dL   Glucose, Bld 104  (H) 70 - 99 mg/dL   Calcium, Ion 1.18 1.13 - 1.30 mmol/L   TCO2 21 0 - 100 mmol/L   Hemoglobin 13.3 12.0 - 15.0 g/dL   HCT 39.0 36.0 - 46.0 %  Urine Drug Screen     Status: None   Collection Time: 07/03/14  9:03 AM  Result Value Ref Range   Opiates NONE DETECTED NONE DETECTED   Cocaine NONE DETECTED NONE DETECTED   Benzodiazepines NONE DETECTED NONE DETECTED   Amphetamines NONE DETECTED NONE DETECTED   Tetrahydrocannabinol NONE DETECTED NONE DETECTED   Barbiturates NONE DETECTED NONE DETECTED    Comment:        DRUG SCREEN FOR MEDICAL PURPOSES ONLY.  IF CONFIRMATION IS NEEDED FOR ANY PURPOSE, NOTIFY LAB WITHIN 5 DAYS.        LOWEST DETECTABLE LIMITS FOR URINE DRUG SCREEN Drug Class       Cutoff (ng/mL) Amphetamine      1000 Barbiturate      200 Benzodiazepine   831 Tricyclics       517 Opiates          300 Cocaine          300 THC              50   Urinalysis, Routine w reflex microscopic     Status: Abnormal   Collection Time: 07/03/14  9:03 AM  Result Value Ref Range   Color, Urine YELLOW YELLOW   APPearance CLOUDY (A) CLEAR   Specific Gravity, Urine 1.039 (H) 1.005 - 1.030   pH 8.0 5.0 - 8.0   Glucose, UA NEGATIVE NEGATIVE mg/dL   Hgb urine dipstick NEGATIVE NEGATIVE   Bilirubin Urine NEGATIVE NEGATIVE   Ketones, ur NEGATIVE NEGATIVE mg/dL   Protein, ur 100 (A) NEGATIVE mg/dL   Urobilinogen, UA 0.2 0.0 - 1.0 mg/dL   Nitrite NEGATIVE NEGATIVE   Leukocytes, UA NEGATIVE NEGATIVE  Urine microscopic-add on     Status: None   Collection Time: 07/03/14  9:03 AM  Result Value Ref Range   WBC, UA 0-2 <  3 WBC/hpf   RBC / HPF 0-2 <3 RBC/hpf   Urine-Other LESS THAN 10 mL OF URINE SUBMITTED     Comment: AMORPHOUS URATES/PHOSPHATES   Ct Head Wo Contrast  07/03/2014   CLINICAL DATA:  Fall today. Acute onset of right-sided hemi paresis.  EXAM: CT HEAD WITHOUT CONTRAST  TECHNIQUE: Contiguous axial images were obtained from the base of the skull through the vertex without  intravenous contrast.  COMPARISON:  None.  FINDINGS: There is some decreased gray-white differentiation along the left insular cortex compatible with an acute nonhemorrhagic infarct. The basal ganglia appear to be intact. Cortical gray-white differentiation is otherwise intact.  No acute hemorrhage or mass lesion is present. The ventricles are of normal size. No significant extra-axial fluid collection is present.  A small fluid level is present in the left sphenoid sinus. The remaining paranasal sinuses are clear. The mastoid air cells are clear. The calvarium is intact. No significant extracranial soft tissue injury is evident.  IMPRESSION: 1. Acute nonhemorrhagic infarct involving the left MCA territory is evidenced by decreased gray-white differentiation along the left insular cortex. 2. No acute hemorrhage.   Electronically Signed   By: Lawrence Santiago M.D.   On: 07/03/2014 09:20   Ct Cerebral Perfusion W/cm  07/03/2014   CLINICAL DATA:  Acute onset of right hemi paresis.  EXAM: CT CEREBRAL PERFUSION WITH CONTRAST  TECHNIQUE: Dynamic enhancement imaging of the brain was performed followed by perforation of parametric perfusion maps.  CONTRAST:  57m OMNIPAQUE IOHEXOL 350 MG/ML SOLN  COMPARISON:  CT head of the same day without contrast  FINDINGS: Source images suggest asymmetric flow within the left MCA branch vessels. This suggests a distal left M1 occlusion. The proximal M1 is visualized.  Mean transit time is elongated throughout at left MCA territory. The left ACA and PCA territories are preserved.  The cerebral blood volume is grossly preserved throughout the left MCA territory. The cerebral blood flow maps min neck the mean transit time with significant decreased cerebral blood flow throughout most of the left MCA territory. The basal ganglia are mostly preserved.  The penumbra map demonstrates a large left MCA territory penumbra with scattered areas of suggested decreased cerebral blood volume along  the medial aspect of the region suggesting completed infarct. This may be artifactual.  IMPRESSION: 1. Large area of left MCA territory ischemia with and decreased mean transit time and decreased cerebral blood flow. Cerebral blood volume is mostly preserved, suggesting this may be reversible.   Electronically Signed   By: CLawrence SantiagoM.D.   On: 07/03/2014 09:41    Assessment: 68y.o. female with recent mitral valve replacement and estrogen of intermittent atrial fibrillation presenting with acute left middle cerebral artery ischemic stroke, most likely embolic in etiology, with acute occlusion of left MCA M1 vessel..  Stroke Risk Factors - atrial fibrillation  Plan: 1. Post interventional radiology admission to intensive care unit 2. MRI of the brain without contrast 3. PT consult, OT consult, Speech consult 4. Echocardiogram 5. HgbA1c, fasting lipid panel 6. Prophylactic therapy-anticoagulation versus continued antiplatelet therapy, to be determined. 7. Risk factor modification  This patient was critically ill and at significant risk of neurological worsening and possibly death, requiring emergency complex management decision making and treatment intervention, including consultation with interventional radiology, anesthesia and family counseling with patient spouse. Total critical care time was 90 minutes.   C.R. SNicole Kindred MD [facility] [phone] 07/03/2014, 10:05 AM

## 2014-07-03 NOTE — Anesthesia Postprocedure Evaluation (Signed)
  Anesthesia Post-op Note  Patient: Colleen Williamson  Procedure(s) Performed: Procedure(s): RADIOLOGY WITH ANESTHESIA (N/A)  Patient Location: PACU  Anesthesia Type:General  Level of Consciousness: awake and alert   Airway and Oxygen Therapy: Patient Spontanous Breathing and Patient connected to nasal cannula oxygen  Post-op Pain: mild  Post-op Assessment: Post-op Vital signs reviewed and Patient's Cardiovascular Status Stable  Post-op Vital Signs: Reviewed and stable  Last Vitals:  Filed Vitals:   07/03/14 1200  BP:   Pulse: 62  Temp: 35.7 C  Resp: 12    Complications: No apparent anesthesia complications

## 2014-07-03 NOTE — ED Notes (Signed)
Pt's family to room, updated by EDP and neuro MD, RN at bedside

## 2014-07-03 NOTE — Progress Notes (Signed)
Echocardiogram 2D Echocardiogram has been performed.  Colleen Williamson 07/03/2014, 4:27 PM

## 2014-07-03 NOTE — ED Provider Notes (Signed)
CSN: [id number]     Arrival date & time 07/03/14  0759 History   First MD Initiated Contact with Patient 07/03/14 0802     Chief Complaint  Patient presents with  . 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 This was performed at [facility]. She got up this morning to go to the bathroom. While in the restroom, she developed the acute onset of weakness to her right arm and leg along with difficulty speaking. EMS was called and she was transported here as a code stroke. She adds little to history due to acuity of medical condition.  The history is provided by the patient.    Past Medical History  Diagnosis Date  . Abnormal mammogram, unspecified 2014   Past Surgical History  Procedure Laterality Date  . Abdominal hysterectomy  2004  . Eye surgery  2007   No family history on file. History  Substance Use Topics  . Smoking status: Not on file  . Smokeless tobacco: Never Used  . Alcohol Use: No   OB History    Gravida Para Term Preterm AB TAB SAB Ectopic Multiple Living   3 3        3       Obstetric Comments   1st Menstrual Cycle:  14 1st Pregnancy: 40     Review of Systems  Unable to perform ROS     Allergies  Sulfa antibiotics  Home Medications   Prior to Admission medications   Medication Sig Start Date End Date Taking? Authorizing Provider  citalopram (CELEXA) 10 MG tablet Take 10 mg by mouth daily.    Historical Provider, MD  meclizine (ANTIVERT) 25 MG tablet Take 1 mg by mouth daily. 12/02/12   Historical Provider, MD  metoprolol succinate (TOPROL-XL) 25 MG 24 hr tablet Take 25 mg by mouth daily.    Historical Provider, MD   Pulse 77  Resp 16 Physical Exam  Constitutional: She appears well-developed and well-nourished. No distress.  HENT:  Head: Normocephalic and atraumatic.  Eyes: EOM  are normal. Pupils are equal, round, and reactive to light.  Neck: Normal range of motion. Neck supple.  Cardiovascular: Normal rate and regular rhythm.  Exam reveals no gallop and no friction rub.   No murmur heard. Pulmonary/Chest: Effort normal and breath sounds normal. No respiratory distress. She has no wheezes.  Abdominal: Soft. Bowel sounds are normal. She exhibits no distension. There is no tenderness.  Musculoskeletal: Normal range of motion.  Neurological: She is alert.  There is diminished strength in the right upper and right lower extremity. There is also a right facial droop noted. She is also noted to have an expressive aphasia. Remainder physical examination is difficult to carry out secondary to acuity of medical condition.  Skin: Skin is warm and dry. She is not diaphoretic.  Nursing note and vitals reviewed.   ED Course  Procedures (including critical care time) Labs Review Labs Reviewed  CBC - Abnormal; Notable for the following:    Hemoglobin 11.9 (*)    All other components within normal limits  DIFFERENTIAL - Abnormal; Notable for the following:    Eosinophils Relative 7 (*)    All other components within normal limits  I-STAT CHEM 8, ED - Abnormal; Notable for the following:    Glucose, Bld 104 (*)    All other components within normal limits  PROTIME-INR  APTT  ETHANOL  COMPREHENSIVE METABOLIC PANEL  URINE RAPID DRUG SCREEN (HOSP PERFORMED)  URINALYSIS, ROUTINE W REFLEX MICROSCOPIC  I-STAT TROPOININ, ED  I-STAT TROPOININ, ED    Imaging Review No results found.   EKG Interpretation   Date/Time:  Tuesday July 03 2014 08:41:45 EST Ventricular Rate:  74 PR Interval:  197 QRS Duration: 90 QT Interval:  438 QTC Calculation: 486 R Axis:   90 Text Interpretation:  Sinus rhythm Ventricular premature complex Consider  left atrial enlargement Anteroseptal infarct, age indeterminate Confirmed  by DELOS  MD, Yvonna Brun ([id number]) on 07/03/2014 10:40:02 AM       MDM   Final diagnoses:  Stroke    Patient arrives here in the form of a code stroke. Her last seen normal was approximately 1 hour prior to arrival here. She was taken immediately to radiology for imaging studies. Her initial head CT was negative, however a perfusion scan was performed at the request of Dr. Nicole Kindred and revealed an acute stroke. As the patient is 2 weeks status post valve replacement, she is not a candidate for systemic TPA. Dr. Nicole Kindred discussed the possibility of intra-arterial TPA with the family who is open to this possibility. She will be taken to interventional radiology for this procedure.  CRITICAL CARE Performed by: Veryl Speak Total critical care time: 30 minutes Critical care time was exclusive of separately billable procedures and treating other patients. Critical care was necessary to treat or prevent imminent or life-threatening deterioration. Critical care was time spent personally by me on the following activities: development of treatment plan with patient and/or surrogate as well as nursing, discussions with consultants, evaluation of patient's response to treatment, examination of patient, obtaining history from patient or surrogate, ordering and performing treatments and interventions, ordering and review of laboratory studies, ordering and review of radiographic studies, pulse oximetry and re-evaluation of patient's condition.     Veryl Speak, MD 07/03/14 [phone]

## 2014-07-03 NOTE — Progress Notes (Signed)
Patient having frequent PVC's and trigeminy. The doc in the box notified. Requested patient have cardiology consult.

## 2014-07-03 NOTE — Progress Notes (Signed)
Chaplain responded to follow up from page from on call night Chaplain to continue support to patient. Family is at bedside. Pt. arrived via [facility] from home, family heard pt fall in bathroom, was unresponsive, per EMS was awake but unable to speak and was unable to move right side.  I escorted family to consultation room for further updates from doctor. Pt. went to IR for procedure and later to 2M03.  Provided hospitality ,empathetic listening,emotional and spiritual support. Will pass off to unit chaplain for continued support.   07/03/14 1200  Clinical Encounter Type  Visited With Patient;Family;Patient and family together;Health care provider  Visit Type Follow-up;Spiritual support;Pre-op;ED  Referral From Chaplain  Spiritual Encounters  Spiritual Needs Prayer;Emotional  Stress Factors  Family Stress Factors Exhausted;Health changes  Jaclynn Major, [place]

## 2014-07-03 NOTE — Progress Notes (Signed)
1st stick in IR 2  at 0910

## 2014-07-03 NOTE — ED Notes (Signed)
Pt undressed, in gown, on monitor, continuous pulse oximetry and blood pressure cuff; EKG performed and vitals being taken; family remains at bedside

## 2014-07-03 NOTE — Transfer of Care (Signed)
Immediate Anesthesia Transfer of Care Note  Patient: Colleen Williamson  Procedure(s) Performed: Procedure(s): RADIOLOGY WITH ANESTHESIA (N/A)  Patient Location: ICU  Anesthesia Type:General  Level of Consciousness: Patient remains intubated per anesthesia plan  Airway & Oxygen Therapy: Patient remains intubated per anesthesia plan and Patient placed on Ventilator (see vital sign flow sheet for setting)  Post-op Assessment: Post -op Vital signs reviewed and stable  Post vital signs: Reviewed and stable  Complications: No apparent anesthesia complications

## 2014-07-03 NOTE — ED Notes (Signed)
Pt transported by IR team, bedside report given at handoff, family updated and to IR with pt  ED and RR RNs at bedside with pt from arrival to transfer to IR, continuous monitoring maintained during ED course, no significant changes in pt's neuro status, VSS

## 2014-07-03 NOTE — Anesthesia Procedure Notes (Signed)
Procedure Name: Intubation Date/Time: 07/03/2014 9:21 AM Performed by: Neldon Newport Pre-anesthesia Checklist: Emergency Drugs available, Patient identified, Suction available, Patient being monitored and Timeout performed Patient Re-evaluated:Patient Re-evaluated prior to inductionOxygen Delivery Method: Circle system utilized Preoxygenation: Pre-oxygenation with 100% oxygen Intubation Type: IV induction, Rapid sequence and Cricoid Pressure applied Laryngoscope Size: Mac and 4 Grade View: Grade III Tube type: Oral Tube size: 7.0 mm Number of attempts: 2 Airway Equipment and Method: Video-laryngoscopy Meredith Staggers [id number]) Placement Confirmation: ETT inserted through vocal cords under direct vision,  positive ETCO2 and breath sounds checked- equal and bilateral Secured at: 21 cm Tube secured with: Tape Dental Injury: Teeth and Oropharynx as per pre-operative assessment  Difficulty Due To: Difficulty was unanticipated

## 2014-07-03 NOTE — Code Documentation (Signed)
68yo female arriving to [facility] at 71.  Patient was LKW at 2200 last night when she went to bed per patient's daughter.  Daughter heard a noise from the bathroom this morning around 0700 and found her mother on the floor.  EMS called and activated Code Stroke.  Patient taken to CT on arrival.  Stroke team at the bedside.  NIHSS 25, see documentation for details and code stroke times.  Patient with right hemiplegia, right facial droop and global aphasia. Patient is outside the window for treatment with tPA.  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  Patient had atrial fibrillation during hospitalization but was not anticoagulated per EDP.  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 CT perfusion study completed showing mismatch per Dr. Nicole Kindred.  IR notified and patient to IR for catheter angiogram. Patient's family updated on plan of care and are agreeable.  Bedside handoff with IR RN Amy.

## 2014-07-03 NOTE — ED Notes (Signed)
Pt is still in CT at this time; family has arrived to room and speaking with Stark Jock, MD

## 2014-07-03 NOTE — Sedation Documentation (Signed)
Revascularization time 1006

## 2014-07-03 NOTE — ED Notes (Signed)
Chaperoned Suezanne Jacquet, RN while he placed a temp foley in pt and collected urine for testing

## 2014-07-04 ENCOUNTER — Encounter (HOSPITAL_COMMUNITY): Payer: Self-pay | Admitting: Interventional Radiology

## 2014-07-04 ENCOUNTER — Inpatient Hospital Stay (HOSPITAL_COMMUNITY): Payer: Medicare HMO

## 2014-07-04 DIAGNOSIS — I480 Paroxysmal atrial fibrillation: Secondary | ICD-10-CM

## 2014-07-04 DIAGNOSIS — I482 Chronic atrial fibrillation: Secondary | ICD-10-CM

## 2014-07-04 DIAGNOSIS — I63312 Cerebral infarction due to thrombosis of left middle cerebral artery: Secondary | ICD-10-CM

## 2014-07-04 LAB — CBC WITH DIFFERENTIAL/PLATELET
Basophils Absolute: 0.1 10*3/uL (ref 0.0–0.1)
Basophils Relative: 1 % (ref 0–1)
EOS PCT: 4 % (ref 0–5)
Eosinophils Absolute: 0.3 10*3/uL (ref 0.0–0.7)
HCT: 34 % — ABNORMAL LOW (ref 36.0–46.0)
Hemoglobin: 10.6 g/dL — ABNORMAL LOW (ref 12.0–15.0)
LYMPHS ABS: 1.1 10*3/uL (ref 0.7–4.0)
Lymphocytes Relative: 17 % (ref 12–46)
MCH: 26.6 pg (ref 26.0–34.0)
MCHC: 31.2 g/dL (ref 30.0–36.0)
MCV: 85.4 fL (ref 78.0–100.0)
Monocytes Absolute: 0.4 10*3/uL (ref 0.1–1.0)
Monocytes Relative: 6 % (ref 3–12)
Neutro Abs: 4.7 10*3/uL (ref 1.7–7.7)
Neutrophils Relative %: 72 % (ref 43–77)
PLATELETS: 196 10*3/uL (ref 150–400)
RBC: 3.98 MIL/uL (ref 3.87–5.11)
RDW: 14.5 % (ref 11.5–15.5)
WBC: 6.6 10*3/uL (ref 4.0–10.5)

## 2014-07-04 LAB — HEMOGLOBIN A1C
Hgb A1c MFr Bld: 5.6 % (ref <5.7)
Mean Plasma Glucose: 114 mg/dL (ref <117)

## 2014-07-04 LAB — GLUCOSE, CAPILLARY
GLUCOSE-CAPILLARY: 80 mg/dL (ref 70–99)
GLUCOSE-CAPILLARY: 69 mg/dL — AB (ref 70–99)
GLUCOSE-CAPILLARY: 134 mg/dL — AB (ref 70–99)
Glucose-Capillary: 76 mg/dL (ref 70–99)
Glucose-Capillary: 66 mg/dL — ABNORMAL LOW (ref 70–99)
Glucose-Capillary: 92 mg/dL (ref 70–99)

## 2014-07-04 LAB — LIPID PANEL
CHOL/HDL RATIO: 2.9 ratio
CHOLESTEROL: 132 mg/dL (ref 0–200)
HDL: 46 mg/dL (ref >39)
LDL CALC: 55 mg/dL (ref 0–99)
Triglycerides: 153 mg/dL — ABNORMAL HIGH (ref <150)
VLDL: 31 mg/dL (ref 0–40)

## 2014-07-04 LAB — BASIC METABOLIC PANEL
ANION GAP: 11 (ref 5–15)
ANION GAP: 14 (ref 5–15)
BUN: 12 mg/dL (ref 6–23)
BUN: 9 mg/dL (ref 6–23)
CALCIUM: 8.9 mg/dL (ref 8.4–10.5)
CHLORIDE: 108 meq/L (ref 96–112)
CO2: 21 meq/L (ref 19–32)
CO2: 21 meq/L (ref 19–32)
Calcium: 8.6 mg/dL (ref 8.4–10.5)
Chloride: 113 mEq/L — ABNORMAL HIGH (ref 96–112)
Creatinine, Ser: 0.62 mg/dL (ref 0.50–1.10)
Creatinine, Ser: 0.5 mg/dL (ref 0.50–1.10)
GFR calc Af Amer: >90 mL/min (ref >90)
GFR calc Af Amer: >90 mL/min (ref >90)
GFR calc non Af Amer: >90 mL/min (ref >90)
GFR calc non Af Amer: >90 mL/min (ref >90)
GLUCOSE: 108 mg/dL — AB (ref 70–99)
Glucose, Bld: 87 mg/dL (ref 70–99)
Potassium: 5 mEq/L (ref 3.7–5.3)
Potassium: 4 mEq/L (ref 3.7–5.3)
SODIUM: 145 meq/L (ref 137–147)
SODIUM: 143 meq/L (ref 137–147)

## 2014-07-04 LAB — TROPONIN I: Troponin I: <0.3 ng/mL (ref <0.30)

## 2014-07-04 MED ORDER — DEXTROSE-NACL 5-0.9 % IV SOLN
INTRAVENOUS | Status: DC
  Administered 2014-07-04 – 2014-07-06 (×3): via INTRAVENOUS

## 2014-07-04 MED ORDER — WHITE PETROLATUM GEL
Status: AC
  Administered 2014-07-04: 0.2
  Filled 2014-07-04: qty 5

## 2014-07-04 MED ORDER — ASPIRIN 300 MG RE SUPP
300.0000 mg | Freq: Every day | RECTAL | Status: DC
  Administered 2014-07-05: 300 mg via RECTAL
  Filled 2014-07-04: qty 1

## 2014-07-04 MED ORDER — HYDRALAZINE HCL 20 MG/ML IJ SOLN: 10.0000 mg | Freq: Four times a day (QID) | INTRAMUSCULAR | Status: DC | PRN

## 2014-07-04 MED ORDER — PANTOPRAZOLE SODIUM 40 MG IV SOLR
40.0000 mg | INTRAVENOUS | Status: DC
Start: 2014-07-04 — End: 2014-07-06
  Administered 2014-07-04 – 2014-07-06 (×3): 40 mg via INTRAVENOUS
  Filled 2014-07-04 (×2): qty 40

## 2014-07-04 MED ORDER — DEXTROSE 50 % IV SOLN
INTRAVENOUS | Status: AC
  Administered 2014-07-04: 50 mL
  Filled 2014-07-04: qty 50

## 2014-07-04 NOTE — Progress Notes (Signed)
4 fr LFA sheath removed at 1129 am.  Manual pressure and v-pad applied for 15 minutes.  Hemostasis obtained at 1145.  Mild pressure dressing applied.  No complications, no hematoma.  Distal pulses intact and site reviewed with RN.

## 2014-07-04 NOTE — Progress Notes (Signed)
Referring Physician(s): Stroke  Subjective:  CVA L MCA clot retrieval 12/8- revascularization On vent No response to me---sedated   Allergies: Sulfa antibiotics  Medications: Prior to Admission medications   Medication Sig Start Date End Date Taking? Authorizing Provider  citalopram (CELEXA) 10 MG tablet Take 10 mg by mouth daily.    Historical Provider, MD  meclizine (ANTIVERT) 25 MG tablet Take 1 mg by mouth daily. 12/02/12   Historical Provider, MD  metoprolol succinate (TOPROL-XL) 25 MG 24 hr tablet Take 25 mg by mouth daily.    Historical Provider, MD    Review of Systems  Vital Signs: BP 116/67 mmHg  Pulse 72  Temp(Src) 98.6 F (37 C) (Core (Comment))  Resp 12  Ht 5\' 10"  (1.778 m)  Wt 84.1 kg (185 lb 6.5 oz)  BMI 26.60 kg/m2  SpO2 100%  Physical Exam  Constitutional: She appears well-developed.  Abdominal:  B groin sites are without hematoma No bleeding Clean and dry B feet: 2+ pulses On vent  no response to me  Nursing note and vitals reviewed.   Imaging: Ct Head Wo Contrast  07/03/2014   CLINICAL DATA:  68 year old female with acute onset of right-sided hemi paresis post angiogram and clot retrieval. Subsequent encounter.  EXAM: CT HEAD WITHOUT CONTRAST  TECHNIQUE: Contiguous axial images were obtained from the base of the skull through the vertex without intravenous contrast.  COMPARISON:  07/03/2014 angiogram and CT.  FINDINGS: Post retrieval of left M1 segment thrombus. At this level, subtle hyperdensity (series 201, image 13) may represent enhancing parenchyma rather than peri vascular hemorrhage however, attention to this on follow up.  Left middle cerebral artery distribution infarct. This is more apparent than on the prior CT. Hyperdensity of the left lenticular nucleus and caudate may reflect enhancement from recent contrast injection (although hemorrhagic transformation of acute infarct cannot be entirely excluded). This can be evaluated on follow  up.  No hydrocephalus.  Small vessel disease type changes.  IMPRESSION: Post retrieval of left M1 segment thrombus. At this level, subtle hyperdensity (series 201, image 13) may represent enhancing parenchyma rather than peri vascular hemorrhage however, attention to this on follow up.  Left middle cerebral artery distribution infarct. This is more apparent than on the prior CT. Hyperdensity of the left lenticular nucleus and caudate may reflect enhancement from recent contrast injection (although hemorrhagic transformation of acute infarct cannot be entirely excluded). This can be evaluated on follow up.   Electronically Signed   By: Chauncey Cruel M.D.   On: 07/03/2014 11:29   Ct Head Wo Contrast  07/03/2014   CLINICAL DATA:  Fall today. Acute onset of right-sided hemi paresis.  EXAM: CT HEAD WITHOUT CONTRAST  TECHNIQUE: Contiguous axial images were obtained from the base of the skull through the vertex without intravenous contrast.  COMPARISON:  None.  FINDINGS: There is some decreased gray-white differentiation along the left insular cortex compatible with an acute nonhemorrhagic infarct. The basal ganglia appear to be intact. Cortical gray-white differentiation is otherwise intact.  No acute hemorrhage or mass lesion is present. The ventricles are of normal size. No significant extra-axial fluid collection is present.  A small fluid level is present in the left sphenoid sinus. The remaining paranasal sinuses are clear. The mastoid air cells are clear. The calvarium is intact. No significant extracranial soft tissue injury is evident.  IMPRESSION: 1. Acute nonhemorrhagic infarct involving the left MCA territory is evidenced by decreased gray-white differentiation along the left insular cortex. 2.  No acute hemorrhage.   Electronically Signed   By: Lawrence Santiago M.D.   On: 07/03/2014 09:20   Ct Cerebral Perfusion W/cm  07/03/2014   CLINICAL DATA:  Acute onset of right hemi paresis.  EXAM: CT CEREBRAL  PERFUSION WITH CONTRAST  TECHNIQUE: Dynamic enhancement imaging of the brain was performed followed by perforation of parametric perfusion maps.  CONTRAST:  55mL OMNIPAQUE IOHEXOL 350 MG/ML SOLN  COMPARISON:  CT head of the same day without contrast  FINDINGS: Source images suggest asymmetric flow within the left MCA branch vessels. This suggests a distal left M1 occlusion. The proximal M1 is visualized.  Mean transit time is elongated throughout at left MCA territory. The left ACA and PCA territories are preserved.  The cerebral blood volume is grossly preserved throughout the left MCA territory. The cerebral blood flow maps min neck the mean transit time with significant decreased cerebral blood flow throughout most of the left MCA territory. The basal ganglia are mostly preserved.  The penumbra map demonstrates a large left MCA territory penumbra with scattered areas of suggested decreased cerebral blood volume along the medial aspect of the region suggesting completed infarct. This may be artifactual.  IMPRESSION: 1. Large area of left MCA territory ischemia with and decreased mean transit time and decreased cerebral blood flow. Cerebral blood volume is mostly preserved, suggesting this may be reversible.   Electronically Signed   By: Lawrence Santiago M.D.   On: 07/03/2014 09:41   Dg Chest Port 1 View  07/04/2014   CLINICAL DATA:  Respiratory failure.  EXAM: PORTABLE CHEST - 1 VIEW  COMPARISON:  07/03/2014.  FINDINGS: Endotracheal tube and NG tube in stable position. Stable cardiomegaly. Prior CABG. Prior cardiac valve surgery. Pulmonary vascularity is normal. Bibasilar atelectasis that is at bibasilar infiltrates are present, right side greater than left. Spot small pleural effusions are present bilaterally, right side greater than left. No pneumothorax.  IMPRESSION: 1. Lines and tubes in stable position. 2. Bibasilar pulmonary infiltrates and bilateral pleural effusions, right greater than left. 3. Stable  cardiomegaly with normal pulmonary vascularity. Prior CABG. Prior cardiac valve replacement.   Electronically Signed   By: Marcello Moores  Register   On: 07/04/2014 07:12   Dg Chest Port 1 View  07/03/2014   CLINICAL DATA:  Stroke, respiratory failure  EXAM: PORTABLE CHEST - 1 VIEW  COMPARISON:  05/28/2012  FINDINGS: Endotracheal tube tip approximately 7 cm above carina. Nasogastric tube extends at least as far as the decompressed stomach, tip not seen. Interval median sternotomy and valve surgery. Heart size upper limits normal for technique. Atheromatous aortic arch. Probable layering pleural effusions right greater than left with adjacent atelectasis/ consolidation in the lung bases. Relatively low lung volumes.  IMPRESSION: 1. Bilateral pleural effusions and bibasilar atelectasis/consolidation. 2.  Support hardware in expected position.   Electronically Signed   By: Arne Cleveland M.D.   On: 07/03/2014 17:20    Labs:  CBC:  Recent Labs  07/03/14 0800 07/03/14 0808 07/03/14 1820 07/04/14 0442  WBC 5.9  --   --  6.6  HGB 11.9* 13.3 9.9* 10.6*  HCT 37.1 39.0 29.0* 34.0*  PLT 201  --   --  196    COAGS:  Recent Labs  07/03/14 0800  INR 1.07  APTT 26    BMP:  Recent Labs  07/03/14 0800 07/03/14 0808 07/03/14 1715 07/03/14 1820 07/04/14 0442  NA 141 142 143 146 145  K 3.8 3.7 3.5* 2.9* 5.0  CL 105  108 108  --  113*  CO2 22  --  22  --  21  GLUCOSE 103* 104* 98  --  87  BUN 15 14 12   --  12  CALCIUM 9.3  --  8.9  --  8.6  CREATININE 0.66 0.70 0.58  --  0.62  GFRNONAA >90  --  >90  --  >90  GFRAA >90  --  >90  --  >90    LIVER FUNCTION TESTS:  Recent Labs  07/03/14 0800  BILITOT 1.1  AST 20  ALT 14  ALKPHOS 120*  PROT 6.6  ALBUMIN 3.7    Assessment and Plan:  CVA; L MCA clot retrieval in IR 12/8 On vent Will follow few days    I spent a total of 15 minutes face to face in clinical consultation/evaluation, greater than 50% of which was  counseling/coordinating care for CVA/ L MCA revascularization  Signed: TURPIN,PAMELA A 07/04/2014, 12:22 PM

## 2014-07-04 NOTE — Progress Notes (Signed)
Per Neuro RN, all NIH components pt is unable to perform were scored as a zero due to intubation/sedation.

## 2014-07-04 NOTE — Progress Notes (Signed)
[facility] Progress Note Patient Name: Colleen Williamson DOB: 1946/06/08 MRN: [id number]   Date of Service  07/04/2014  HPI/Events of Note  Hypoglycemic episodes.  eICU Interventions  Will add dextrose to IV fluid.     Intervention Category Major Interventions: Other:  Kou Gucciardo 07/04/2014, 7:07 PM

## 2014-07-04 NOTE — Progress Notes (Addendum)
STROKE TEAM PROGRESS NOTE   HISTORY Colleen Williamson is an 68 year old woman with history of mitral valve disease and mitral valve repair on 06/12/2014 at [facility], as well as paroxysmal atrial fibrillation not on anticoagulation, who presented on 12/8 with new onset aphasia and right hemiplegia. There is no previous history of stroke nor TIA. She has been on aspirin daily. Exact time of onset is unclear, last seen well at 10 PM on 12/7. Her family heard her fall in the bathroom at 7 AM on the morning of her presentation. She was unable to speak and unable to move her right side. CT scan of her head showed decreased gray-white differentiation along the left insular cortex. CT perfusion study showed a large area of left MCA territory ischemia reduced mean transit time and reduce cerebral flow. Blood volume however was mostly preserved. She was taken to interventional radiology for further evaluation. Cerebral angiogram showed left MCA M1 occlusion. NIH stroke score was 25. She underwent revascularization at 10:06 AM on 12/8 of the occluded artery with intra-arterial Integrelin.   Patient was not administered tPA secondary to open heart surgery with mitral valve replacement on 06/16/14, in addition she was outside the window for tPA. Marland Kitchen She was admitted to the neuro ICU for further evaluation and treatment.   SUBJECTIVE (INTERVAL HISTORY) Her three daughters were at the bedside.  Overall her condition is unchanged. She remains intubated with right side hemiparesis.    OBJECTIVE Temp:  [98.6 F (37 C)-99.8 F (37.7 C)] 99 F (37.2 C) (12/09 1300) Pulse Rate:  [68-83] 71 (12/09 1300) Cardiac Rhythm:  [-] Normal sinus rhythm (12/09 1300) Resp:  [12-19] 14 (12/09 1300) BP: (110-125)/(55-68) 110/55 mmHg (12/09 1300) SpO2:  [99 %-100 %] 100 % (12/09 1150) Arterial Line BP: (111-161)/(62-96) 161/87 mmHg (12/09 1100) FiO2 (%):  [30 %-40 %] 30 % (12/09 1200) Weight:  [185 lb 6.5 oz (84.1 kg)] 185 lb 6.5 oz (84.1  kg) (12/09 0500)   Recent Labs Lab 07/03/14 0800 07/03/14 1224 07/03/14 1528 07/03/14 1919 07/04/14 0826  GLUCAP 108* 100* 95 83 76    Recent Labs Lab 07/03/14 0800 07/03/14 0808 07/03/14 1715 07/03/14 1820 07/04/14 0442  NA 141 142 143 146 145  K 3.8 3.7 3.5* 2.9* 5.0  CL 105 108 108  --  113*  CO2 22  --  22  --  21  GLUCOSE 103* 104* 98  --  87  BUN 15 14 12   --  12  CREATININE 0.66 0.70 0.58  --  0.62  CALCIUM 9.3  --  8.9  --  8.6  MG  --   --  2.0  --   --   PHOS  --   --  3.2  --   --     Recent Labs Lab 07/03/14 0800  AST 20  ALT 14  ALKPHOS 120*  BILITOT 1.1  PROT 6.6  ALBUMIN 3.7    Recent Labs Lab 07/03/14 0800 07/03/14 0808 07/03/14 1820 07/04/14 0442  WBC 5.9  --   --  6.6  NEUTROABS 3.5  --   --  4.7  HGB 11.9* 13.3 9.9* 10.6*  HCT 37.1 39.0 29.0* 34.0*  MCV 83.4  --   --  85.4  PLT 201  --   --  196    Recent Labs Lab 07/03/14 1706 07/03/14 2222 07/04/14 0442  TROPONINI <0.30 <0.30 <0.30    Recent Labs  07/03/14 0800  LABPROT 14.0  INR 1.07    Recent Labs  07/03/14 0903  COLORURINE YELLOW  LABSPEC 1.039*  PHURINE 8.0  GLUCOSEU NEGATIVE  HGBUR NEGATIVE  BILIRUBINUR NEGATIVE  KETONESUR NEGATIVE  PROTEINUR 100*  UROBILINOGEN 0.2  NITRITE NEGATIVE  LEUKOCYTESUR NEGATIVE       Component Value Date/Time   CHOL 132 07/04/2014 0442   TRIG 153* 07/04/2014 0442   HDL 46 07/04/2014 0442   CHOLHDL 2.9 07/04/2014 0442   VLDL 31 07/04/2014 0442   LDLCALC 55 07/04/2014 0442   Lab Results  Component Value Date   HGBA1C 5.6 07/04/2014     Recent Labs Lab 07/03/14 0800  ETH <11    Ct Head Wo Contrast  07/03/2014   CLINICAL DATA:  68 year old female with acute onset of right-sided hemi paresis post angiogram and clot retrieval. Subsequent encounter.  EXAM: CT HEAD WITHOUT CONTRAST  TECHNIQUE: Contiguous axial images were obtained from the base of the skull through the vertex without intravenous contrast.   COMPARISON:  07/03/2014 angiogram and CT.  FINDINGS: Post retrieval of left M1 segment thrombus. At this level, subtle hyperdensity (series 201, image 13) may represent enhancing parenchyma rather than peri vascular hemorrhage however, attention to this on follow up.  Left middle cerebral artery distribution infarct. This is more apparent than on the prior CT. Hyperdensity of the left lenticular nucleus and caudate may reflect enhancement from recent contrast injection (although hemorrhagic transformation of acute infarct cannot be entirely excluded). This can be evaluated on follow up.  No hydrocephalus.  Small vessel disease type changes.  IMPRESSION: Post retrieval of left M1 segment thrombus. At this level, subtle hyperdensity (series 201, image 13) may represent enhancing parenchyma rather than peri vascular hemorrhage however, attention to this on follow up.  Left middle cerebral artery distribution infarct. This is more apparent than on the prior CT. Hyperdensity of the left lenticular nucleus and caudate may reflect enhancement from recent contrast injection (although hemorrhagic transformation of acute infarct cannot be entirely excluded). This can be evaluated on follow up.   Electronically Signed   By: Chauncey Cruel M.D.   On: 07/03/2014 11:29   Ct Head Wo Contrast  07/03/2014   CLINICAL DATA:  Fall today. Acute onset of right-sided hemi paresis.  EXAM: CT HEAD WITHOUT CONTRAST  TECHNIQUE: Contiguous axial images were obtained from the base of the skull through the vertex without intravenous contrast.  COMPARISON:  None.  FINDINGS: There is some decreased gray-white differentiation along the left insular cortex compatible with an acute nonhemorrhagic infarct. The basal ganglia appear to be intact. Cortical gray-white differentiation is otherwise intact.  No acute hemorrhage or mass lesion is present. The ventricles are of normal size. No significant extra-axial fluid collection is present.  A small  fluid level is present in the left sphenoid sinus. The remaining paranasal sinuses are clear. The mastoid air cells are clear. The calvarium is intact. No significant extracranial soft tissue injury is evident.  IMPRESSION: 1. Acute nonhemorrhagic infarct involving the left MCA territory is evidenced by decreased gray-white differentiation along the left insular cortex. 2. No acute hemorrhage.   Electronically Signed   By: Lawrence Santiago M.D.   On: 07/03/2014 09:20   Ct Cerebral Perfusion W/cm  07/03/2014   CLINICAL DATA:  Acute onset of right hemi paresis.  EXAM: CT CEREBRAL PERFUSION WITH CONTRAST  TECHNIQUE: Dynamic enhancement imaging of the brain was performed followed by perforation of parametric perfusion maps.  CONTRAST:  40mL OMNIPAQUE IOHEXOL 350 MG/ML SOLN  COMPARISON:  CT head of the same day without contrast  FINDINGS: Source images suggest asymmetric flow within the left MCA branch vessels. This suggests a distal left M1 occlusion. The proximal M1 is visualized.  Mean transit time is elongated throughout at left MCA territory. The left ACA and PCA territories are preserved.  The cerebral blood volume is grossly preserved throughout the left MCA territory. The cerebral blood flow maps min neck the mean transit time with significant decreased cerebral blood flow throughout most of the left MCA territory. The basal ganglia are mostly preserved.  The penumbra map demonstrates a large left MCA territory penumbra with scattered areas of suggested decreased cerebral blood volume along the medial aspect of the region suggesting completed infarct. This may be artifactual.  IMPRESSION: 1. Large area of left MCA territory ischemia with and decreased mean transit time and decreased cerebral blood flow. Cerebral blood volume is mostly preserved, suggesting this may be reversible.   Electronically Signed   By: Lawrence Santiago M.D.   On: 07/03/2014 09:41   Dg Chest Port 1 View  07/04/2014   CLINICAL DATA:   Respiratory failure.  EXAM: PORTABLE CHEST - 1 VIEW  COMPARISON:  07/03/2014.  FINDINGS: Endotracheal tube and NG tube in stable position. Stable cardiomegaly. Prior CABG. Prior cardiac valve surgery. Pulmonary vascularity is normal. Bibasilar atelectasis that is at bibasilar infiltrates are present, right side greater than left. Spot small pleural effusions are present bilaterally, right side greater than left. No pneumothorax.  IMPRESSION: 1. Lines and tubes in stable position. 2. Bibasilar pulmonary infiltrates and bilateral pleural effusions, right greater than left. 3. Stable cardiomegaly with normal pulmonary vascularity. Prior CABG. Prior cardiac valve replacement.   Electronically Signed   By: Marcello Moores  Register   On: 07/04/2014 07:12   Dg Chest Port 1 View  07/03/2014   CLINICAL DATA:  Stroke, respiratory failure  EXAM: PORTABLE CHEST - 1 VIEW  COMPARISON:  05/28/2012  FINDINGS: Endotracheal tube tip approximately 7 cm above carina. Nasogastric tube extends at least as far as the decompressed stomach, tip not seen. Interval median sternotomy and valve surgery. Heart size upper limits normal for technique. Atheromatous aortic arch. Probable layering pleural effusions right greater than left with adjacent atelectasis/ consolidation in the lung bases. Relatively low lung volumes.  IMPRESSION: 1. Bilateral pleural effusions and bibasilar atelectasis/consolidation. 2.  Support hardware in expected position.   Electronically Signed   By: Arne Cleveland M.D.   On: 07/03/2014 17:20     PHYSICAL EXAM General: Sedated, intubated.frail Caucasian lady not in distress. . Afebrile. Head is nontraumatic. Neck is supple without bruit.  . Cardiac exam no murmur or gallop. Lungs are clear to auscultation. Distal pulses are well felt. Neurologic Examination: Mental Status:  Drowsy but can be aroused Follows some commands, moves her eyes up down slightly. Pupils equal and reactive. Fundi were not visualized. Draws  away from painful stimuli. Cranial Nerves: III/IV/VI-Pupils were equal and reacted. Slight conjugate gaze deviation to the left side but eyes move beyond midline to right.   V/VII-neglect of right side; moderate right lower facial weakness. X, XII- gag reflex present Motor: Right  Dense hemiplegia with decrease in muscle tone; normal tone and strength of left extremities. Sensory: Draws away from painful stimuli Deep Tendon Reflexes: Asymmetric DTRs with 2+ and brisk responses of left extremities and 3+ responses of right extremities. Plantars: Flexor bilaterally Cerebellar: Unable to assess due to being intubated/sedated  ASSESSMENT/PLAN Ms. CAOIMHE DAMRON is a  69 y.o. woman with history of A. 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  She did not receive IV t-PA due to recent surgery and being outside the tPA window but underwent successful revascularization with intra-arterial intergrelin and mechanical embolectomy with Trevo provue device.   Stroke: Non-dominant left infarct embolic/occulsion secondary to MCA M1 occlusion.  Resultant  Right hemiplegia  MRI  Pending  MRA  Pending  Carotid Doppler  Ordered  2D Echo: no source of emboli  CT head wo contrast: Post retrieval of left M1 segment thrombus. Left middle cerebral artery distribution infarct  CT cerebral perfusion: prior to revascularization, large area of Left MCA territory ischemia with decreased mean transit time and decreased cerebral blood blood. Cerebral blood flow is mostly preserved, suggesting this may be reversible.   LDL 55  HgbA1c 5.6%  SCDs for VTE prophylaxis  Diet NPO time specified   aspirin 81 mg orally every day prior to admission, wait 24 hours after revascularization for anticoagulation--she is a good candidate for the novel anticoagulation agents  Ongoing aggressive stroke risk factor management   Disposition:   Pending  Hypertension  Home meds:   Metoprolol 25mg  XL  Stable  Hyperlipidemia, no hx of  Home meds:  None  LDL 55, goal < 70  Diabetes -No hx of diabetes  HgbA1c 5.6% goal < 7.0  Other Stroke Risk Factors  Advanced age  Not a Cigarette smoker  No ETOH use  No Obesity, Body mass index is 26.6 kg/(m^2).   No Hx stroke/TIA  No Family hx stroke   No hx of Coronary artery disease  No mirgraines  No Obstructive sleep apnea, on CPAP at home  Other Active Problems  Recent open heart surgery for MV prolapse repair, not on anticoagulation   Paroxysmal A. Fib not on anticoagulation   Hospital day # 1 I have personally examined this patient, reviewed notes, independently viewed imaging studies, participated in medical decision making and plan of care. I have made any additions or clarifications directly to the above note. Agree with note above. I had a long discussion with the patient's 3 daughters regarding her clinical presentation, neurological exam, personally reviewed images with them, and discuss plan for further evaluation, treatment and answered questions. Plan to repeat brain imaging this afternoon and if no significant mass effect or hemorrhage make consider extubation. This patient is critically ill and at significant risk of neurological worsening, death and care requires constant monitoring of vital signs, hemodynamics,respiratory and cardiac monitoring,review of multiple databases, neurological assessment, discussion with family, other specialists and medical decision making of high complexity.I have made any additions or clarifications directly to the above note.  I spent 50 minutes of neurocritical care time  in the care of  this patient.  Antony Contras, MD Medical Director [place] Pager: [phone] 07/04/2014 7:05 PM    To contact Stroke Continuity provider, please refer to [email or web address] After hours, contact General Neurology

## 2014-07-04 NOTE — Progress Notes (Signed)
Placed pt. Back on full support per NP paul. Plan is to let pt. Rest for the night.

## 2014-07-04 NOTE — Progress Notes (Signed)
*  PRELIMINARY RESULTS* Vascular Ultrasound Carotid Duplex has been completed.  Preliminary findings: Bilateral:  1-39% ICA stenosis.  Vertebral artery flow is antegrade.      Landry Mellow, RDMS, RVT  07/04/2014, 3:42 PM

## 2014-07-04 NOTE — Progress Notes (Signed)
SLP Cancellation Note  Patient Details Name: THEO REITHER MRN: [id number] DOB: 1946/06/06   Cancelled treatment:       Reason Eval/Treat Not Completed: Medical issues which prohibited therapy (intubated. please re-consult when extubated.)  Gabriel Rainwater Evans, CCC-SLP [phone]  Gabriel Rainwater Meryl 07/04/2014, 9:57 AM

## 2014-07-04 NOTE — Progress Notes (Signed)
PT Cancellation Note  Patient Details Name: Colleen Williamson MRN: [id number] DOB: July 11, 1946   Cancelled Treatment:    Reason Eval/Treat Not Completed: Patient at procedure or test/unavailable  Attempt for PT evaluation this afternoon. Patient currently off unit for test/procedure. Will follow up tomorrow.   [name], Colleen Williamson 07/04/2014, 2:49 PM

## 2014-07-04 NOTE — Progress Notes (Signed)
Placed pt. On SBT, per NP paul. Pt. Tolerating well at this time.

## 2014-07-04 NOTE — Progress Notes (Signed)
   Name: Colleen Williamson MRN: [id number] DOB: [birth year]    ADMISSION DATE:  07/03/2014 CONSULTATION DATE:  07/03/14  REFERRING MD :  Dr Nicole Kindred   CHIEF COMPLAINT:  Respiratory failure  BRIEF PATIENT DESCRIPTION:  68 yo woman, hx A Fib, MVR on 06/11/14 for MVP.  Admitted with L MCA CVA am 12/8. To IR for angiogram and revascularization. To ICU on MV.   SIGNIFICANT EVENTS  11/16 MVR [facility]) 12/8 L MCA CVA 12/8 s/p cerebral angio + revascularization    STUDIES:  CT head 12/08:  large area L MCA territory ischemia  MRI 12/9 >>    SUBJECTIVE:  Stable overnight, no WUA performed yet  VITAL SIGNS: Temp:  [96.3 F (35.7 C)-99.8 F (37.7 C)] 98.6 F (37 C) (12/09 0700) Pulse Rate:  [62-83] 71 (12/09 0700) Resp:  [12-19] 14 (12/09 0700) BP: (116-125)/(61-68) 116/67 mmHg (12/09 0330) SpO2:  [99 %-100 %] 99 % (12/09 0400) Arterial Line BP: (111-149)/(62-79) 146/78 mmHg (12/09 0700) FiO2 (%):  [30 %-40 %] 30 % (12/09 0800) Weight:  [84.1 kg (185 lb 6.5 oz)] 84.1 kg (185 lb 6.5 oz) (12/09 0500)  PHYSICAL EXAMINATION: General:  RASS -3, intubated Neuro:  R hemiplegia, withdraws RUE and RLE, Babinski normal on R, absent on L HEENT: NCAT, PERRL Cardiovascular: Freq extrasystoles, + syst M Lungs: Clear Abdomen:  Soft, NT, +BS Ext: no edema, warm   Recent Labs Lab 07/03/14 0800 07/03/14 0808 07/03/14 1715 07/03/14 1820 07/04/14 0442  NA 141 142 143 146 145  K 3.8 3.7 3.5* 2.9* 5.0  CL 105 108 108  --  113*  CO2 22  --  22  --  21  BUN 15 14 12   --  12  CREATININE 0.66 0.70 0.58  --  0.62  GLUCOSE 103* 104* 98  --  87    Recent Labs Lab 07/03/14 0800 07/03/14 0808 07/03/14 1820 07/04/14 0442  HGB 11.9* 13.3 9.9* 10.6*  HCT 37.1 39.0 29.0* 34.0*  WBC 5.9  --   --  6.6  PLT 201  --   --  196   CXR: small R>L effusions  ASSESSMENT / PLAN: 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 Recent MV repair 11/16 Paroxysmal A fib - apparently not on anticoagulation (INR  normal on admission) Acute respiratory failure due to neurological event, altered MS HTN, controlled on nicardipine gtt initially, now off Hyperkalemia H/O Depression  Plans:  Full vent support - settings reviewed and/or adjusted Cont vent bundle Plan WUA and SBT 12/9 depending on MRI brain results BP goal < 170 SBP, hydralazine added. Nicardipine off Monitor BMET intermittently Monitor I/Os Cont Propofol gtt Further eval and mgmt of CVA per Neurology   Independent CC time 45 minutes.   Baltazar Apo, MD, PhD 07/04/2014, 11:25 AM [facility] [phone] or if no answer [phone]

## 2014-07-05 DIAGNOSIS — I6300 Cerebral infarction due to thrombosis of unspecified precerebral artery: Secondary | ICD-10-CM

## 2014-07-05 DIAGNOSIS — I481 Persistent atrial fibrillation: Secondary | ICD-10-CM

## 2014-07-05 LAB — BASIC METABOLIC PANEL
Anion gap: 11 (ref 5–15)
BUN: 9 mg/dL (ref 6–23)
CALCIUM: 8.6 mg/dL (ref 8.4–10.5)
CO2: 22 mEq/L (ref 19–32)
Chloride: 111 mEq/L (ref 96–112)
Creatinine, Ser: 0.45 mg/dL — ABNORMAL LOW (ref 0.50–1.10)
GFR calc non Af Amer: >90 mL/min (ref >90)
GLUCOSE: 99 mg/dL (ref 70–99)
Potassium: 3.5 mEq/L — ABNORMAL LOW (ref 3.7–5.3)
SODIUM: 144 meq/L (ref 137–147)

## 2014-07-05 LAB — GLUCOSE, CAPILLARY
GLUCOSE-CAPILLARY: 102 mg/dL — AB (ref 70–99)
GLUCOSE-CAPILLARY: 96 mg/dL (ref 70–99)
GLUCOSE-CAPILLARY: 97 mg/dL (ref 70–99)
Glucose-Capillary: 114 mg/dL — ABNORMAL HIGH (ref 70–99)
Glucose-Capillary: 87 mg/dL (ref 70–99)
Glucose-Capillary: 96 mg/dL (ref 70–99)
Glucose-Capillary: 97 mg/dL (ref 70–99)

## 2014-07-05 LAB — MAGNESIUM: Magnesium: 1.8 mg/dL (ref 1.5–2.5)

## 2014-07-05 LAB — PROTIME-INR
INR: 1.19 (ref 0.00–1.49)
Prothrombin Time: 15.2 seconds (ref 11.6–15.2)

## 2014-07-05 MED ORDER — POTASSIUM CHLORIDE 20 MEQ/15ML (10%) PO SOLN: 20.0000 meq | ORAL | Status: DC

## 2014-07-05 MED ORDER — POTASSIUM CHLORIDE 20 MEQ/15ML (10%) PO SOLN
20.0000 meq | ORAL | Status: AC
  Administered 2014-07-05 (×2): 20 meq
  Filled 2014-07-05 (×2): qty 15

## 2014-07-05 NOTE — Progress Notes (Signed)
PT Cancellation Note  Patient Details Name: Colleen Williamson MRN: [id number] DOB: [birth year]   Cancelled Treatment:    Reason Eval/Treat Not Completed: Patient not medically ready  Patient placed on bed rest this AM. Orders to "Log roll allowed only after 4 hours post hemostasis. Log roll with affected leg straight."  Please update activity orders when patient is medically ready for PT evaluation. Will continue to follow and attempt evaluation tomorrow as pt is able.   Ellouise Newer 07/05/2014, 12:09 PM Elayne Snare, [place]

## 2014-07-05 NOTE — Progress Notes (Signed)
STROKE TEAM PROGRESS NOTE   HISTORY Colleen Williamson is an 68 year old woman with history of mitral valve disease and mitral valve repair on 06/12/2014 at [facility], as well as paroxysmal atrial fibrillation not on anticoagulation, who presented on 12/8 with new onset aphasia and right hemiplegia. There is no previous history of stroke nor TIA. She has been on aspirin daily. Exact time of onset is unclear, last seen well at 10 PM on 12/7. Her family heard her fall in the bathroom at 7 AM on the morning of her presentation. She was unable to speak and unable to move her right side. CT scan of her head showed decreased gray-white differentiation along the left insular cortex. CT perfusion study showed a large area of left MCA territory ischemia reduced mean transit time and reduce cerebral flow. Blood volume however was mostly preserved. She was taken to interventional radiology for further evaluation. Cerebral angiogram showed left MCA M1 occlusion. NIH stroke score was 25. She underwent revascularization at 10:06 AM on 12/8 of the occluded artery with intra-arterial Integrelin.   Patient was not administered tPA secondary to open heart surgery with mitral valve replacement on 06/16/14, in addition she was outside the window for tPA. Marland Kitchen She was admitted to the neuro ICU for further evaluation and treatment.   SUBJECTIVE (INTERVAL HISTORY) Her daughters were at the bedside.  Overall her condition is much improved. She has done well postextubation and shown neurological improvement. She is able to move her right leg and right arm with mild coordination impairment.   OBJECTIVE Temp:  [99 F (37.2 C)-100.6 F (38.1 C)] 99.6 F (37.6 C) (12/10 1500) Pulse Rate:  [26-98] 75 (12/10 1500) Cardiac Rhythm:  [-] Normal sinus rhythm (12/10 1200) Resp:  [13-22] 13 (12/10 1500) BP: (100-140)/(51-99) 132/72 mmHg (12/10 1500) SpO2:  [98 %-100 %] 99 % (12/10 1500) FiO2 (%):  [30 %] 30 % (12/10 0900) Weight:  [187 lb 13.3  oz (85.2 kg)] 187 lb 13.3 oz (85.2 kg) (12/10 0400)   Recent Labs Lab 07/04/14 2351 07/05/14 0400 07/05/14 0748 07/05/14 1243 07/05/14 1554  GLUCAP 97 96 96 97 87    Recent Labs Lab 07/03/14 0800 07/03/14 0808 07/03/14 1715 07/03/14 1820 07/04/14 0442 07/04/14 2043 07/05/14 0212  NA 141 142 143 146 145 143 144  K 3.8 3.7 3.5* 2.9* 5.0 4.0 3.5*  CL 105 108 108  --  113* 108 111  CO2 22  --  22  --  21 21 22   GLUCOSE 103* 104* 98  --  87 108* 99  BUN 15 14 12   --  12 9 9   CREATININE 0.66 0.70 0.58  --  0.62 0.50 0.45*  CALCIUM 9.3  --  8.9  --  8.6 8.9 8.6  MG  --   --  2.0  --   --   --   --   PHOS  --   --  3.2  --   --   --   --     Recent Labs Lab 07/03/14 0800  AST 20  ALT 14  ALKPHOS 120*  BILITOT 1.1  PROT 6.6  ALBUMIN 3.7    Recent Labs Lab 07/03/14 0800 07/03/14 0808 07/03/14 1820 07/04/14 0442  WBC 5.9  --   --  6.6  NEUTROABS 3.5  --   --  4.7  HGB 11.9* 13.3 9.9* 10.6*  HCT 37.1 39.0 29.0* 34.0*  MCV 83.4  --   --  85.4  PLT 201  --   --  196    Recent Labs Lab 07/03/14 1706 07/03/14 2222 07/04/14 0442  TROPONINI <0.30 <0.30 <0.30    Recent Labs  07/03/14 0800 07/05/14 0212  LABPROT 14.0 15.2  INR 1.07 1.19    Recent Labs  07/03/14 0903  COLORURINE YELLOW  LABSPEC 1.039*  PHURINE 8.0  GLUCOSEU NEGATIVE  HGBUR NEGATIVE  BILIRUBINUR NEGATIVE  KETONESUR NEGATIVE  PROTEINUR 100*  UROBILINOGEN 0.2  NITRITE NEGATIVE  LEUKOCYTESUR NEGATIVE       Component Value Date/Time   CHOL 132 07/04/2014 0442   TRIG 153* 07/04/2014 0442   HDL 46 07/04/2014 0442   CHOLHDL 2.9 07/04/2014 0442   VLDL 31 07/04/2014 0442   LDLCALC 55 07/04/2014 0442   Lab Results  Component Value Date   HGBA1C 5.6 07/04/2014     Recent Labs Lab 07/03/14 0800  ETH <11    Mr Brain Wo Contrast  07/04/2014   CLINICAL DATA:  Left MCA stroke.  Left M1 clot retrieval 07/03/2014  EXAM: MRI HEAD WITHOUT CONTRAST  MRA HEAD WITHOUT CONTRAST   TECHNIQUE: Multiplanar, multiecho pulse sequences of the brain and surrounding structures were obtained without intravenous contrast. Angiographic images of the head were obtained using MRA technique without contrast.  COMPARISON:  CT 07/03/2014  FINDINGS: MRI HEAD FINDINGS  Acute infarct left MCA territory. Infarct involves the body of the caudate but not the head of the caudate. Most of the putamen is involved with a small amount of hemorrhage in the putamen. There is a mild amount of infarct in the insular cortex and a small amount of infarct in the left lateral temporal lobe. No other areas of acute infarct or hemorrhage identified.  Ventricle size is normal.  No shift of the midline structures.  No chronic ischemic changes are identified. Right hemispheres normal. Brainstem and cerebellum are normal.  Negative for mass lesion.  MRA HEAD FINDINGS  Two acquisitions were obtained. The second acquisition is of better quality.  Both vertebral arteries are patent to the basilar. Basilar widely patent. Superior cerebellar and posterior cerebral arteries are patent.  Right internal carotid artery widely patent. Right anterior and middle cerebral arteries are patent. Mild atherosclerotic disease in the right middle cerebral artery M1 segment and branches.  Left internal carotid artery widely patent. Left anterior cerebral artery widely patent. Left M1 segment is patent without significant stenosis. Left middle cerebral artery branches show mild irregularity without significant stenosis or branch occlusion.  Negative for cerebral aneurysm.  IMPRESSION: Acute left MCA infarct involving the body the caudate comminuted putamen, insula, and lateral temporal lobe as above. Small amount of hemorrhage in the left putamen.  MRA reveals successful re- cannulization of left M1 segment which is widely patent. No branch occlusion identified on the left. Mild intracranial atherosclerotic disease is present.   Electronically Signed    By: Franchot Gallo M.D.   On: 07/04/2014 16:01   Dg Chest Port 1 View  07/04/2014   CLINICAL DATA:  Respiratory failure.  EXAM: PORTABLE CHEST - 1 VIEW  COMPARISON:  07/03/2014.  FINDINGS: Endotracheal tube and NG tube in stable position. Stable cardiomegaly. Prior CABG. Prior cardiac valve surgery. Pulmonary vascularity is normal. Bibasilar atelectasis that is at bibasilar infiltrates are present, right side greater than left. Spot small pleural effusions are present bilaterally, right side greater than left. No pneumothorax.  IMPRESSION: 1. Lines and tubes in stable position. 2. Bibasilar pulmonary infiltrates and bilateral pleural effusions,  right greater than left. 3. Stable cardiomegaly with normal pulmonary vascularity. Prior CABG. Prior cardiac valve replacement.   Electronically Signed   By: Marcello Moores  Register   On: 07/04/2014 07:12   Dg Chest Port 1 View  07/03/2014   CLINICAL DATA:  Stroke, respiratory failure  EXAM: PORTABLE CHEST - 1 VIEW  COMPARISON:  05/28/2012  FINDINGS: Endotracheal tube tip approximately 7 cm above carina. Nasogastric tube extends at least as far as the decompressed stomach, tip not seen. Interval median sternotomy and valve surgery. Heart size upper limits normal for technique. Atheromatous aortic arch. Probable layering pleural effusions right greater than left with adjacent atelectasis/ consolidation in the lung bases. Relatively low lung volumes.  IMPRESSION: 1. Bilateral pleural effusions and bibasilar atelectasis/consolidation. 2.  Support hardware in expected position.   Electronically Signed   By: Arne Cleveland M.D.   On: 07/03/2014 17:20   [name] Nam Head/brain Wo Cm  07/04/2014   CLINICAL DATA:  Left MCA stroke.  Left M1 clot retrieval 07/03/2014  EXAM: MRI HEAD WITHOUT CONTRAST  MRA HEAD WITHOUT CONTRAST  TECHNIQUE: Multiplanar, multiecho pulse sequences of the brain and surrounding structures were obtained without intravenous contrast. Angiographic images of the  head were obtained using MRA technique without contrast.  COMPARISON:  CT 07/03/2014  FINDINGS: MRI HEAD FINDINGS  Acute infarct left MCA territory. Infarct involves the body of the caudate but not the head of the caudate. Most of the putamen is involved with a small amount of hemorrhage in the putamen. There is a mild amount of infarct in the insular cortex and a small amount of infarct in the left lateral temporal lobe. No other areas of acute infarct or hemorrhage identified.  Ventricle size is normal.  No shift of the midline structures.  No chronic ischemic changes are identified. Right hemispheres normal. Brainstem and cerebellum are normal.  Negative for mass lesion.  MRA HEAD FINDINGS  Two acquisitions were obtained. The second acquisition is of better quality.  Both vertebral arteries are patent to the basilar. Basilar widely patent. Superior cerebellar and posterior cerebral arteries are patent.  Right internal carotid artery widely patent. Right anterior and middle cerebral arteries are patent. Mild atherosclerotic disease in the right middle cerebral artery M1 segment and branches.  Left internal carotid artery widely patent. Left anterior cerebral artery widely patent. Left M1 segment is patent without significant stenosis. Left middle cerebral artery branches show mild irregularity without significant stenosis or branch occlusion.  Negative for cerebral aneurysm.  IMPRESSION: Acute left MCA infarct involving the body the caudate comminuted putamen, insula, and lateral temporal lobe as above. Small amount of hemorrhage in the left putamen.  MRA reveals successful re- cannulization of left M1 segment which is widely patent. No branch occlusion identified on the left. Mild intracranial atherosclerotic disease is present.   Electronically Signed   By: Franchot Gallo M.D.   On: 07/04/2014 16:01     PHYSICAL EXAM General: Sitting up in bed, in NAD. Hoarseness. Afebrile. Head is nontraumatic. Neck is  supple without bruit. Lungs are clear to auscultation bilaterally.Cardiac exam no murmur or gallop. Lungs are clear to auscultation. Distal pulses are well felt. Neurologic Examination: Mental Status:  Alert, following commands. Mild dysarthria.  Cranial Nerves: III/IV/VI-Pupils were equal and reacted. Slight conjugate gaze deviation to the left side but eyes move beyond midline to right. Intact EOM.  V/VII-mild right lower facial weakness. X, XII- gag reflex present Motor: Mild weakness of right arm, more pronounced in  right hand with normal muscle tone; normal strength and tone of right LE; normal tone and strength of left extremities. Diminished fine finger movements in the right hand. Orbits left or right upper extremity. Mild right grip weakness. Sensory: Normal sensation on light touch in UE and LE bilaterally Plantars: Flexor bilaterally Cerebellar: Normal finger finger-to-nose with left hand, slow with the right.   ASSESSMENT/PLAN Colleen Williamson is a 68 y.o. woman with history of A. 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  She did not receive IV t-PA due to recent surgery and being outside the tPA window but underwent successful revascularization with intra-arterial intergrelin and mechanical embolectomy with Trevo provue device.   Stroke: Non-dominant left infarct embolic/occulsion secondary to MCA M1 occlusion.  Resultant  Right hemiplegia  MRI  Acute left MCA infarct involving the body of the caudate, putamen, insula, and lateral temporal lobe. Small amount of hemorrhage of the left putamen.   MRA  Successful recanalization of the left M1 segment which is widely patent. No branch occlusion on the left. Mild intracranial atherosclerotic disease.   Carotid Doppler  Ordered  2D Echo: no source of emboli  CT head wo contrast: Post retrieval of left M1 segment thrombus. Left middle cerebral  artery distribution infarct  CT cerebral perfusion: prior to revascularization, large area of Left MCA territory ischemia with decreased mean transit time and decreased cerebral blood blood. Cerebral blood flow is mostly preserved, suggesting this may be reversible.   LDL 55  HgbA1c 5.6%  SCDs for VTE prophylaxis  Diet NPO time specified, awaiting SLP eval  aspirin 81 mg orally every day prior to admission, she is now 24 hours after revascularization for anticoagulation--she is a good candidate for the novel anticoagulation agents. Currently receiving ASA but will likely start Eliquis once/if she passes her swallow evaluation.   Ongoing aggressive stroke risk factor management   Disposition:  Transferred out of the ICU to Neuro floor today.   Hypertension  Home meds:   Metoprolol 25mg  XL  Stable  Hyperlipidemia, no hx of  Home meds:  None  LDL 55, goal < 70  Diabetes -No hx of diabetes  HgbA1c 5.6% goal < 7.0  Other Stroke Risk Factors  Advanced age  A. fib  Other Active Problems  Recent open heart surgery for MV prolapse repair, not on anticoagulation   Paroxysmal A. Fib not on anticoagulation   Hospital day # 2 I have personally examined this patient, reviewed notes, independently viewed imaging studies, participated in medical decision making and plan of care. I have made any additions or clarifications directly to the above note. Agree with note above. Transfer to neurology floor bed. Discussed with patient and daughters and answered questions  Antony Contras, MD Medical Director [place] Pager: [phone] 07/05/2014 9:24 PM   To contact Stroke Continuity provider, please refer to [email or web address] After hours, contact General Neurology

## 2014-07-05 NOTE — Plan of Care (Signed)
Problem: Consults Goal: RH STROKE PATIENT EDUCATION See Patient Education module for education specifics  Outcome: Progressing

## 2014-07-05 NOTE — Evaluation (Signed)
Physical Therapy Evaluation Patient Details Name: Colleen Williamson MRN: [id number] DOB: 1946/07/16 Today's Date: 07/05/2014   History of Present Illness  Colleen Williamson is an 68 y.o. female history of mitral valve disease and mitral valve replacement on 06/16/2014, as well as intermittent atrial fibrillation not on anticoagulation, presenting with new onset aphasia and right hemiplegia. MRI: Acute infarct left MCA territory. Infarct involves the body of the caudate but not the head of the caudate. Most of the putamen is involved with a small amount of hemorrhage in the putamen. There is a mild amount of infarct in the insular cortex and a small amount of infarct in the left lateral temporal lobe.  Clinical Impression  Pt admitted with above complications. Pt currently with functional limitations due to the deficits listed below (see PT Problem List). Tolerated therapy session well, able to ambulate up to 8 feet with +2 hand held assistance. Pt will benefit from skilled PT to increase their independence and safety with mobility to allow discharge to the venue listed below.       Follow Up Recommendations CIR    Equipment Recommendations   (TBD)    Recommendations for Other Services Rehab consult     Precautions / Restrictions Precautions Precautions: Fall Restrictions Weight Bearing Restrictions: Yes Other Position/Activity Restrictions: sternal precautions      Mobility  Bed Mobility Overal bed mobility: Needs Assistance Bed Mobility: Supine to Sit     Supine to sit: Min guard;HOB elevated     General bed mobility comments: increased time and cues getting up on right hand side of bed. VC for technique  Transfers Overall transfer level: Needs assistance Equipment used: 2 person hand held assist Transfers: Sit to/from Stand Sit to Stand: Min assist         General transfer comment: Min assist for balance to stand. Cues for technique and to maintain sternal  precautions.  Ambulation/Gait Ambulation/Gait assistance: Min assist;+2 physical assistance Ambulation Distance (Feet): 8 Feet Assistive device: 2 person hand held assist Gait Pattern/deviations: Step-to pattern;Decreased step length - right;Decreased stance time - right;Decreased stride length;Decreased dorsiflexion - right;Narrow base of support   Gait velocity interpretation: Below normal speed for age/gender General Gait Details: Min assist +2 for balance and moderate cues for sequencing. Pt with narrow BOS and dragging RLE during swing limb advancement, unable to clear foot unless cued; then she is able to physically lift foot from ground. Fairly stable with bil UE support but was not pushing heavily through arms.  Stairs            Wheelchair Mobility    Modified Rankin (Stroke Patients Only) Modified Rankin (Stroke Patients Only) Pre-Morbid Rankin Score:  (Unsure of pre-morbid status, notes indicate possible level 5) Modified Rankin: Moderately severe disability     Balance Overall balance assessment: Needs assistance Sitting-balance support: Feet supported;Single extremity supported Sitting balance-Leahy Scale: Fair     Standing balance support: Bilateral upper extremity supported Standing balance-Leahy Scale: Fair                               Pertinent Vitals/Pain Pain Assessment: No/denies pain  HR 74 SpO2 99% room air BP 123/64    Home Living Family/patient expects to be discharged to:: Unsure Living Arrangements: Children Available Help at Discharge: Family;Available PRN/intermittently Type of Home: House Home Access: Stairs to enter Entrance Stairs-Rails: None;Right (see above) Entrance Stairs-Number of Steps: 2 no rail - landing -  4 with rails Home Layout: Two level;Able to live on main level with bedroom/bathroom Home Equipment: None      Prior Function Level of Independence: Independent               Hand Dominance    Dominant Hand: Right    Extremity/Trunk Assessment   Upper Extremity Assessment: Defer to OT evaluation RUE Deficits / Details: grossly 2/5         Lower Extremity Assessment: RLE deficits/detail RLE Deficits / Details: Equal compared to homologous muscles on LLE except for knee extension 3+/5 with MMT. No spasticity, tone, or clonus noted on exam. Reports normal light touch sensation.       Communication   Communication: No difficulties  Cognition Arousal/Alertness: Awake/alert Behavior During Therapy: WFL for tasks assessed/performed Overall Cognitive Status: Impaired/Different from baseline Area of Impairment: Problem solving;Safety/judgement         Safety/Judgement: Decreased awareness of deficits (dragging RLE a bit (1/2 step to each full step of LLE))   Problem Solving: Requires verbal cues General Comments: was having difficulty with left/right sensory discrimination (at times she would say left for right and vis versa, but then would immediately correct herself)    General Comments      Exercises General Exercises - Lower Extremity Ankle Circles/Pumps: AROM;Both;10 reps;Seated Quad Sets: Strengthening;Both;10 reps;Seated Long Arc Quad: Strengthening;Both;10 reps;Seated Hip Flexion/Marching: Strengthening;Both;10 reps;Seated      Assessment/Plan    PT Assessment Patient needs continued PT services  PT Diagnosis Difficulty walking;Abnormality of gait;Hemiplegia dominant side   PT Problem List Decreased strength;Decreased range of motion;Decreased activity tolerance;Decreased balance;Decreased mobility;Decreased coordination;Decreased cognition;Decreased knowledge of use of DME;Decreased knowledge of precautions;Cardiopulmonary status limiting activity  PT Treatment Interventions DME instruction;Gait training;Functional mobility training;Therapeutic activities;Balance training;Therapeutic exercise;Neuromuscular re-education;Cognitive remediation;Patient/family  education;Modalities   PT Goals (Current goals can be found in the Care Plan section) Acute Rehab PT Goals Patient Stated Goal: to rehab then home PT Goal Formulation: With patient Time For Goal Achievement: 07/19/14 Potential to Achieve Goals: Fair    Frequency Min 4X/week   Barriers to discharge Inaccessible home environment no 24 hour care at home    Co-evaluation PT/OT/SLP Co-Evaluation/Treatment: Yes Reason for Co-Treatment: Complexity of the patient's impairments (multi-system involvement);Necessary to address cognition/behavior during functional activity;For patient/therapist safety PT goals addressed during session: Mobility/safety with mobility;Balance;Strengthening/ROM OT goals addressed during session: ADL's and self-care       End of Session   Activity Tolerance: Patient tolerated treatment well Patient left: in chair;with call bell/phone within reach Nurse Communication: Mobility status         Time: 9480-1655 PT Time Calculation (min) (ACUTE ONLY): 37 min   Charges:   PT Evaluation $Initial PT Evaluation Tier I: 1 Procedure PT Treatments $Therapeutic Activity: 8-22 mins   PT G CodesEllouise Newer 07/05/2014, 5:31 PM  Camille Bal [place], [place]

## 2014-07-05 NOTE — Progress Notes (Signed)
07/05/2014-Resp care note- Pt suctioned and extubated at 1000 as per MD order.  RN at bedside for procedure.  Hr83, rr16, bp 132/71, sats100% on 4lpm cannula.  Pt tolerated procedure well with pt able to vocalize post extubation.  s Reyn Faivre rrt, rcp

## 2014-07-05 NOTE — Plan of Care (Signed)
Problem: Consults Goal: Patient's airway will be maintained Outcome: Completed/Met Date Met:  07/05/14 Extubated to [place]. Maintaining airway.

## 2014-07-05 NOTE — Progress Notes (Signed)
Referring Physician(s): Sethi  Subjective:  CVA L MCA revascularization Clot retrieval 07/03/14 Extubated Up in bed Talking to me appropriate  Allergies: Sulfa antibiotics  Medications: Prior to Admission medications   Medication Sig Start Date End Date Taking? Authorizing Provider  aspirin EC 81 MG tablet Take 81 mg by mouth daily.   Yes Historical Provider, MD  citalopram (CELEXA) 10 MG tablet Take 10 mg by mouth daily.   Yes Historical Provider, MD  diltiazem (CARDIZEM) 90 MG tablet Take 90 mg by mouth 4 (four) times daily.   Yes Historical Provider, MD  furosemide (LASIX) 20 MG tablet Take 20 mg by mouth daily.   Yes Historical Provider, MD  meclizine (ANTIVERT) 25 MG tablet Take 1 mg by mouth daily. 12/02/12  Yes Historical Provider, MD  metoprolol succinate (TOPROL-XL) 25 MG 24 hr tablet Take 25 mg by mouth daily.   Yes Historical Provider, MD  oxyCODONE-acetaminophen (PERCOCET/ROXICET) 5-325 MG per tablet Take 0.5-1 tablets by mouth every 4 (four) hours as needed for severe pain.   Yes Historical Provider, MD    Review of Systems  Vital Signs: BP 128/72 mmHg  Pulse 75  Temp(Src) 99.5 F (37.5 C) (Core (Comment))  Resp 14  Ht 5\' 10"  (1.778 m)  Wt 85.2 kg (187 lb 13.3 oz)  BMI 26.95 kg/m2  SpO2 99%  Physical Exam  Constitutional: She is oriented to person, place, and time. She appears well-developed.  Pleasant Up in bed  Musculoskeletal: Normal range of motion.  Pushes/pulls feet = Moves with good strength all 4s Follows all commands Face symmetrical  Neurological: She is alert and oriented to person, place, and time.    Imaging: Ct Head Wo Contrast  07/03/2014   CLINICAL DATA:  68 year old female with acute onset of right-sided hemi paresis post angiogram and clot retrieval. Subsequent encounter.  EXAM: CT HEAD WITHOUT CONTRAST  TECHNIQUE: Contiguous axial images were obtained from the base of the skull through the vertex without intravenous contrast.   COMPARISON:  07/03/2014 angiogram and CT.  FINDINGS: Post retrieval of left M1 segment thrombus. At this level, subtle hyperdensity (series 201, image 13) may represent enhancing parenchyma rather than peri vascular hemorrhage however, attention to this on follow up.  Left middle cerebral artery distribution infarct. This is more apparent than on the prior CT. Hyperdensity of the left lenticular nucleus and caudate may reflect enhancement from recent contrast injection (although hemorrhagic transformation of acute infarct cannot be entirely excluded). This can be evaluated on follow up.  No hydrocephalus.  Small vessel disease type changes.  IMPRESSION: Post retrieval of left M1 segment thrombus. At this level, subtle hyperdensity (series 201, image 13) may represent enhancing parenchyma rather than peri vascular hemorrhage however, attention to this on follow up.  Left middle cerebral artery distribution infarct. This is more apparent than on the prior CT. Hyperdensity of the left lenticular nucleus and caudate may reflect enhancement from recent contrast injection (although hemorrhagic transformation of acute infarct cannot be entirely excluded). This can be evaluated on follow up.   Electronically Signed   By: Chauncey Cruel M.D.   On: 07/03/2014 11:29   Ct Head Wo Contrast  07/03/2014   CLINICAL DATA:  Fall today. Acute onset of right-sided hemi paresis.  EXAM: CT HEAD WITHOUT CONTRAST  TECHNIQUE: Contiguous axial images were obtained from the base of the skull through the vertex without intravenous contrast.  COMPARISON:  None.  FINDINGS: There is some decreased gray-white differentiation along the left insular  cortex compatible with an acute nonhemorrhagic infarct. The basal ganglia appear to be intact. Cortical gray-white differentiation is otherwise intact.  No acute hemorrhage or mass lesion is present. The ventricles are of normal size. No significant extra-axial fluid collection is present.  A small  fluid level is present in the left sphenoid sinus. The remaining paranasal sinuses are clear. The mastoid air cells are clear. The calvarium is intact. No significant extracranial soft tissue injury is evident.  IMPRESSION: 1. Acute nonhemorrhagic infarct involving the left MCA territory is evidenced by decreased gray-white differentiation along the left insular cortex. 2. No acute hemorrhage.   Electronically Signed   By: Lawrence Santiago M.D.   On: 07/03/2014 09:20   Mr Brain Wo Contrast  07/04/2014   CLINICAL DATA:  Left MCA stroke.  Left M1 clot retrieval 07/03/2014  EXAM: MRI HEAD WITHOUT CONTRAST  MRA HEAD WITHOUT CONTRAST  TECHNIQUE: Multiplanar, multiecho pulse sequences of the brain and surrounding structures were obtained without intravenous contrast. Angiographic images of the head were obtained using MRA technique without contrast.  COMPARISON:  CT 07/03/2014  FINDINGS: MRI HEAD FINDINGS  Acute infarct left MCA territory. Infarct involves the body of the caudate but not the head of the caudate. Most of the putamen is involved with a small amount of hemorrhage in the putamen. There is a mild amount of infarct in the insular cortex and a small amount of infarct in the left lateral temporal lobe. No other areas of acute infarct or hemorrhage identified.  Ventricle size is normal.  No shift of the midline structures.  No chronic ischemic changes are identified. Right hemispheres normal. Brainstem and cerebellum are normal.  Negative for mass lesion.  MRA HEAD FINDINGS  Two acquisitions were obtained. The second acquisition is of better quality.  Both vertebral arteries are patent to the basilar. Basilar widely patent. Superior cerebellar and posterior cerebral arteries are patent.  Right internal carotid artery widely patent. Right anterior and middle cerebral arteries are patent. Mild atherosclerotic disease in the right middle cerebral artery M1 segment and branches.  Left internal carotid artery widely  patent. Left anterior cerebral artery widely patent. Left M1 segment is patent without significant stenosis. Left middle cerebral artery branches show mild irregularity without significant stenosis or branch occlusion.  Negative for cerebral aneurysm.  IMPRESSION: Acute left MCA infarct involving the body the caudate comminuted putamen, insula, and lateral temporal lobe as above. Small amount of hemorrhage in the left putamen.  MRA reveals successful re- cannulization of left M1 segment which is widely patent. No branch occlusion identified on the left. Mild intracranial atherosclerotic disease is present.   Electronically Signed   By: Franchot Gallo M.D.   On: 07/04/2014 16:01   Ir Thrombect Prim Mech Init (inclu) Mod Sed  07/04/2014   CLINICAL DATA:  Acute onset of right-sided weakness. Aphasia. Left gaze preference.  EXAM: IR ANGIO INTRA EXTRACRAN SEL COM CAROTID INNOMINATE UNI RIGHT MOD SED. BILATERAL COMMON CAROTID ARTERIOGRAMS AND RIGHT VERTEBRAL ARTERY ANGIOGRAM, FOLLOWED BY COMPLETE REVASCULARIZATION OF OCCLUDED LEFT MIDDLE CEREBRAL ARTERY M1 SEGMENT USING MECHANICAL THROMBECTOMY, AND 3 MG OF SUPERSELECTIVE INTRACRANIAL INTRA-ARTERIAL INTEGRELIN.  ANESTHESIA/SEDATION: General anesthesia.  MEDICATIONS: As per anesthesia.  CONTRAST:  100 mL OMNIPAQUE IOHEXOL 300 MG/ML  SOLN  PROCEDURE: Following a full explanation of the procedure along with the potential associated complications, an informed witnessed consent was obtained from the patient's daughter.  The patient was put under general anesthesia by the Department of Anesthesiology at [facility]  [place] right groin was prepped and draped in the usual sterile fashion. Thereafter using modified Seldinger technique, transfemoral access into the right common femoral artery was obtained without difficulty. Over a 0.035 inch guidewire, a 5 French Pinnacle sheath was inserted. Through this, and also over a 0.035 inch guidewire, a 5 French JB1 catheter was  advanced to the aortic arch region and selectively positioned in the right common carotid artery, the right vertebral artery and the left common carotid artery. There were no acute complications. The patient tolerated the procedure well.  COMPLICATIONS: None immediate.  FINDINGS: The right common carotid arteriogram demonstrates the right external carotid artery and its major branches to be normal. The right internal carotid artery at the bulb to the cranial skull base opacifies normally.  The petrous, the cavernous and the supraclinoid segments are widely patent.  The right vertebral artery origin is normal.  The vessel opacifies normally to the cranial skull base. Normal opacification is seen of the right posterior inferior cerebellar artery. The visualized basilar artery and the anterior-inferior cerebellar arteries appear normal.  The left common carotid arteriogram demonstrates the left external carotid artery and its major branches to be normal.  The left internal carotid artery at the bulb to the cranial skull base opacifies normally.  The petrous and the cavernous segments are widely patent.  There left anterior cerebral artery is seen to opacify normally into the capillary and venous phases.  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 A large area of hypo to a perfusion is seen involving the left middle cerebral artery distribution.  ENDOVASCULAR COMPLETE REVASCULARIZATION OF OCCLUDED LEFT MIDDLE CEREBRAL ARTERY USING MECHANICAL THROMBECTOMY, AND SUPER SELECTIVE INTRACRANIAL INTRA-ARTERIAL 3 MG OF INTEGRELIN:  The angiographic findings were reviewed with the referring neurologist.  Given the findings of the CT perfusion mips depicting a large area of penumbra involving the left MCA distribution, it was decided to proceed with endovascular revascularization. The option was reviewed with the family. The reason was to improve subsequent neurological recovery.  The risks of intracranial  hemorrhage of 10%, worsening neurological deficit, ventilator dependency, and inability to revascularization were all reviewed in detail.  Informed consent was obtained.  The diagnostic JB1 catheter in the left common carotid artery was exchanged over a 0.035 inch 300 cm Rosen exchange guidewire for an 8 French 55 cm Brite tip neurovascular sheath using biplane roadmap technique and constant fluoroscopic guidance. Good aspiration was obtained from the side port of the neurovascular sheath. A gentle contrast injection demonstrated no evidence of spasms, dissections or of intraluminal filling defects.  This was then connected to continuous heparinized saline infusion.  Over the [employer] guidewire, a 90 cm 8 [place] Flowgate balloon guide catheter, which had been prepped with 50% contrast, and 50% heparinized saline infusion, was advanced to the left common carotid artery and positioned just proximal to the left common carotid artery bifurcation. The guidewire was removed. Good aspiration was obtained from the hub of the 8 French balloon guide catheter a gentle contrast injection demonstrated no evidence of spasms, dissections or of intraluminal filling defects.  Using biplane roadmap technique and constant fluoroscopic guidance, the 8 [place] Flowgate guide catheter was then advanced to the distal vertical segment of the left internal carotid artery over a 0.035 inch Roadrunner guidewire. The guidewire was removed. Good aspiration was obtained from hub of the 8 French balloon guide catheter. Again a gentle contrast injection demonstrated no evidence of spasms, dissections or  of intraluminal filling defects.  Again no change was seen in the intracranial circulation.  At this time a combination of an 18 L Merci micro catheter with a DAC 125 catheter was advanced in combination over a 0.014 inch Softip Synchro micro guidewire to the distal end of the guide catheter. With the micro guidewire leading with a J-tip  configuration, the combination was navigated to the supraclinoid left ICA. The J-tip was to prevent spasms or inducing dissections.  The left MCA was entered with the micro guidewire without difficulty and advanced into the distal M3 region of the inferior division of the left middle cerebral artery followed by the micro catheter combination. The guidewire was removed. Aspiration of blood was obtained from the hub of the catheter. A gentle constant injection demonstrated antegrade flow but slow with small filling defects. This prompted the use of 3 mg of superselective intracranial intra-arterial Integrelin.  A 4 mm x 30 mm TREVOPROVUE mechanical thrombectomy device was then purged with heparinized saline infusion in its housing.  Using biplane roadmap technique and constant fluoroscopic guidance, in a coaxial manner and with constant heparinized saline infusion, this was then advanced to the distal end of the 18 L Merci micro catheter. The O rings on the delivery micro guidewire and the delivery micro catheter were then released. With slight forward gentle traction with the right hand on the delivery micro guidewire, with the left hand the delivery micro catheter was then retrieved unsheathing the standard retrieval device. The tip of the micro catheter was just proximal to the proximal portion of the retrieval device. This was left deployed for approximately 3 and a half minutes. A gentle contrast injection through the 8 [place] guide catheter demonstrated antegrade flow through the stented segment with filling defects within the stent.  At this time the balloon at the distal end of the 8 [place] Flowgate guide catheter was then inflated for proximal flow arrest. The micro catheter was advanced to capture the proximal segment of the retrieval device. Thereafter while supplying gentle aspiration with a 60 mL syringe on the side port of the Tuohy-Borst at the hub of the 8 [place] Flowgate guide catheter, the  combination of the retrieval device, the 18 L micro catheter, the [facility] 125 catheter were then retrieved and removed gently while aspiration was continued. The retrieval device was noted to have multiple clots within its struts. The fluffing technique of the retrieval device by gently advancing the micro catheter was used to dislodge the formed clot. Aspirate also contained specks of clot.  The guide catheter which was occluded was then retrieved and removed, after deflation of the proximal flow arrest. A control arteriogram performed through the [place] catheter in the left common carotid artery demonstrated complete angiographic revascularization of the left middle cerebral artery distribution. No filling defects were seen. There were no areas of extravasation of contrast noted. The patient's blood pressure and neurological status remained stable throughout the procedure.  The 8 French neurovascular sheath was then retrieved into the abdominal aorta and exchanged over a J-tip guidewire for a 9 French Pinnacle sheath. This was then successfully removed with the application of an external closure device without difficulty. A pressure dressing was applied to the right groin.  The patient was then transferred to the CT scanner for postprocedural CT scan of the brain.  IMPRESSION: Status post complete angiographic TICI 3, revascularization of occluded left M1 segment proximally using a single pass with a TREVOPROVUE 4 mm x 30  mm stent retrieval device and 3 mg of superselective intracranial intra-arterial Integrelin.   Electronically Signed   By: Luanne Bras M.D.   On: 07/04/2014 10:19   Ct Cerebral Perfusion W/cm  07/03/2014   CLINICAL DATA:  Acute onset of right hemi paresis.  EXAM: CT CEREBRAL PERFUSION WITH CONTRAST  TECHNIQUE: Dynamic enhancement imaging of the brain was performed followed by perforation of parametric perfusion maps.  CONTRAST:  58mL OMNIPAQUE IOHEXOL 350 MG/ML SOLN  COMPARISON:  CT  head of the same day without contrast  FINDINGS: Source images suggest asymmetric flow within the left MCA branch vessels. This suggests a distal left M1 occlusion. The proximal M1 is visualized.  Mean transit time is elongated throughout at left MCA territory. The left ACA and PCA territories are preserved.  The cerebral blood volume is grossly preserved throughout the left MCA territory. The cerebral blood flow maps min neck the mean transit time with significant decreased cerebral blood flow throughout most of the left MCA territory. The basal ganglia are mostly preserved.  The penumbra map demonstrates a large left MCA territory penumbra with scattered areas of suggested decreased cerebral blood volume along the medial aspect of the region suggesting completed infarct. This may be artifactual.  IMPRESSION: 1. Large area of left MCA territory ischemia with and decreased mean transit time and decreased cerebral blood flow. Cerebral blood volume is mostly preserved, suggesting this may be reversible.   Electronically Signed   By: Lawrence Santiago M.D.   On: 07/03/2014 09:41   Dg Chest Port 1 View  07/04/2014   CLINICAL DATA:  Respiratory failure.  EXAM: PORTABLE CHEST - 1 VIEW  COMPARISON:  07/03/2014.  FINDINGS: Endotracheal tube and NG tube in stable position. Stable cardiomegaly. Prior CABG. Prior cardiac valve surgery. Pulmonary vascularity is normal. Bibasilar atelectasis that is at bibasilar infiltrates are present, right side greater than left. Spot small pleural effusions are present bilaterally, right side greater than left. No pneumothorax.  IMPRESSION: 1. Lines and tubes in stable position. 2. Bibasilar pulmonary infiltrates and bilateral pleural effusions, right greater than left. 3. Stable cardiomegaly with normal pulmonary vascularity. Prior CABG. Prior cardiac valve replacement.   Electronically Signed   By: Marcello Moores  Register   On: 07/04/2014 07:12   Dg Chest Port 1 View  07/03/2014   CLINICAL  DATA:  Stroke, respiratory failure  EXAM: PORTABLE CHEST - 1 VIEW  COMPARISON:  05/28/2012  FINDINGS: Endotracheal tube tip approximately 7 cm above carina. Nasogastric tube extends at least as far as the decompressed stomach, tip not seen. Interval median sternotomy and valve surgery. Heart size upper limits normal for technique. Atheromatous aortic arch. Probable layering pleural effusions right greater than left with adjacent atelectasis/ consolidation in the lung bases. Relatively low lung volumes.  IMPRESSION: 1. Bilateral pleural effusions and bibasilar atelectasis/consolidation. 2.  Support hardware in expected position.   Electronically Signed   By: Arne Cleveland M.D.   On: 07/03/2014 17:20   [name] Nam Head/brain Wo Cm  07/04/2014   CLINICAL DATA:  Left MCA stroke.  Left M1 clot retrieval 07/03/2014  EXAM: MRI HEAD WITHOUT CONTRAST  MRA HEAD WITHOUT CONTRAST  TECHNIQUE: Multiplanar, multiecho pulse sequences of the brain and surrounding structures were obtained without intravenous contrast. Angiographic images of the head were obtained using MRA technique without contrast.  COMPARISON:  CT 07/03/2014  FINDINGS: MRI HEAD FINDINGS  Acute infarct left MCA territory. Infarct involves the body of the caudate but not the head of the  caudate. Most of the putamen is involved with a small amount of hemorrhage in the putamen. There is a mild amount of infarct in the insular cortex and a small amount of infarct in the left lateral temporal lobe. No other areas of acute infarct or hemorrhage identified.  Ventricle size is normal.  No shift of the midline structures.  No chronic ischemic changes are identified. Right hemispheres normal. Brainstem and cerebellum are normal.  Negative for mass lesion.  MRA HEAD FINDINGS  Two acquisitions were obtained. The second acquisition is of better quality.  Both vertebral arteries are patent to the basilar. Basilar widely patent. Superior cerebellar and posterior cerebral arteries  are patent.  Right internal carotid artery widely patent. Right anterior and middle cerebral arteries are patent. Mild atherosclerotic disease in the right middle cerebral artery M1 segment and branches.  Left internal carotid artery widely patent. Left anterior cerebral artery widely patent. Left M1 segment is patent without significant stenosis. Left middle cerebral artery branches show mild irregularity without significant stenosis or branch occlusion.  Negative for cerebral aneurysm.  IMPRESSION: Acute left MCA infarct involving the body the caudate comminuted putamen, insula, and lateral temporal lobe as above. Small amount of hemorrhage in the left putamen.  MRA reveals successful re- cannulization of left M1 segment which is widely patent. No branch occlusion identified on the left. Mild intracranial atherosclerotic disease is present.   Electronically Signed   By: Franchot Gallo M.D.   On: 07/04/2014 16:01   Ir Angio Intra Extracran Sel Com Carotid Innominate Uni R Mod Sed  07/04/2014   CLINICAL DATA:  Acute onset of right-sided weakness. Aphasia. Left gaze preference.  EXAM: IR ANGIO INTRA EXTRACRAN SEL COM CAROTID INNOMINATE UNI RIGHT MOD SED. BILATERAL COMMON CAROTID ARTERIOGRAMS AND RIGHT VERTEBRAL ARTERY ANGIOGRAM, FOLLOWED BY COMPLETE REVASCULARIZATION OF OCCLUDED LEFT MIDDLE CEREBRAL ARTERY M1 SEGMENT USING MECHANICAL THROMBECTOMY, AND 3 MG OF SUPERSELECTIVE INTRACRANIAL INTRA-ARTERIAL INTEGRELIN.  ANESTHESIA/SEDATION: General anesthesia.  MEDICATIONS: As per anesthesia.  CONTRAST:  100 mL OMNIPAQUE IOHEXOL 300 MG/ML  SOLN  PROCEDURE: Following a full explanation of the procedure along with the potential associated complications, an informed witnessed consent was obtained from the patient's daughter.  The patient was put under general anesthesia by the Department of Anesthesiology at [facility].  The right groin was prepped and draped in the usual sterile fashion. Thereafter using modified  Seldinger technique, transfemoral access into the right common femoral artery was obtained without difficulty. Over a 0.035 inch guidewire, a 5 French Pinnacle sheath was inserted. Through this, and also over a 0.035 inch guidewire, a 5 French JB1 catheter was advanced to the aortic arch region and selectively positioned in the right common carotid artery, the right vertebral artery and the left common carotid artery. There were no acute complications. The patient tolerated the procedure well.  COMPLICATIONS: None immediate.  FINDINGS: The right common carotid arteriogram demonstrates the right external carotid artery and its major branches to be normal. The right internal carotid artery at the bulb to the cranial skull base opacifies normally.  The petrous, the cavernous and the supraclinoid segments are widely patent.  The right vertebral artery origin is normal.  The vessel opacifies normally to the cranial skull base. Normal opacification is seen of the right posterior inferior cerebellar artery. The visualized basilar artery and the anterior-inferior cerebellar arteries appear normal.  The left common carotid arteriogram demonstrates the left external carotid artery and its major branches to be normal.  The  left internal carotid artery at the bulb to the cranial skull base opacifies normally.  The petrous and the cavernous segments are widely patent.  There left anterior cerebral artery is seen to opacify normally into the capillary and venous phases.  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 A large area of hypo to a perfusion is seen involving the left middle cerebral artery distribution.  ENDOVASCULAR COMPLETE REVASCULARIZATION OF OCCLUDED LEFT MIDDLE CEREBRAL ARTERY USING MECHANICAL THROMBECTOMY, AND SUPER SELECTIVE INTRACRANIAL INTRA-ARTERIAL 3 MG OF INTEGRELIN:  The angiographic findings were reviewed with the referring neurologist.  Given the findings of the CT perfusion mips  depicting a large area of penumbra involving the left MCA distribution, it was decided to proceed with endovascular revascularization. The option was reviewed with the family. The reason was to improve subsequent neurological recovery.  The risks of intracranial hemorrhage of 10%, worsening neurological deficit, ventilator dependency, and inability to revascularization were all reviewed in detail.  Informed consent was obtained.  The diagnostic JB1 catheter in the left common carotid artery was exchanged over a 0.035 inch 300 cm Rosen exchange guidewire for an 8 French 55 cm Brite tip neurovascular sheath using biplane roadmap technique and constant fluoroscopic guidance. Good aspiration was obtained from the side port of the neurovascular sheath. A gentle contrast injection demonstrated no evidence of spasms, dissections or of intraluminal filling defects.  This was then connected to continuous heparinized saline infusion.  Over the [employer] guidewire, a 90 cm 8 [place] Flowgate balloon guide catheter, which had been prepped with 50% contrast, and 50% heparinized saline infusion, was advanced to the left common carotid artery and positioned just proximal to the left common carotid artery bifurcation. The guidewire was removed. Good aspiration was obtained from the hub of the 8 French balloon guide catheter a gentle contrast injection demonstrated no evidence of spasms, dissections or of intraluminal filling defects.  Using biplane roadmap technique and constant fluoroscopic guidance, the 8 [place] Flowgate guide catheter was then advanced to the distal vertical segment of the left internal carotid artery over a 0.035 inch Roadrunner guidewire. The guidewire was removed. Good aspiration was obtained from hub of the 8 French balloon guide catheter. Again a gentle contrast injection demonstrated no evidence of spasms, dissections or of intraluminal filling defects.  Again no change was seen in the intracranial  circulation.  At this time a combination of an 18 L Merci micro catheter with a DAC 125 catheter was advanced in combination over a 0.014 inch Softip Synchro micro guidewire to the distal end of the guide catheter. With the micro guidewire leading with a J-tip configuration, the combination was navigated to the supraclinoid left ICA. The J-tip was to prevent spasms or inducing dissections.  The left MCA was entered with the micro guidewire without difficulty and advanced into the distal M3 region of the inferior division of the left middle cerebral artery followed by the micro catheter combination. The guidewire was removed. Aspiration of blood was obtained from the hub of the catheter. A gentle constant injection demonstrated antegrade flow but slow with small filling defects. This prompted the use of 3 mg of superselective intracranial intra-arterial Integrelin.  A 4 mm x 30 mm TREVOPROVUE mechanical thrombectomy device was then purged with heparinized saline infusion in its housing.  Using biplane roadmap technique and constant fluoroscopic guidance, in a coaxial manner and with constant heparinized saline infusion, this was then advanced to the distal end of the 18 L Merci  micro catheter. The O rings on the delivery micro guidewire and the delivery micro catheter were then released. With slight forward gentle traction with the right hand on the delivery micro guidewire, with the left hand the delivery micro catheter was then retrieved unsheathing the standard retrieval device. The tip of the micro catheter was just proximal to the proximal portion of the retrieval device. This was left deployed for approximately 3 and a half minutes. A gentle contrast injection through the 8 [place] guide catheter demonstrated antegrade flow through the stented segment with filling defects within the stent.  At this time the balloon at the distal end of the 8 [place] Flowgate guide catheter was then inflated for proximal flow  arrest. The micro catheter was advanced to capture the proximal segment of the retrieval device. Thereafter while supplying gentle aspiration with a 60 mL syringe on the side port of the Tuohy-Borst at the hub of the 8 [place] Flowgate guide catheter, the combination of the retrieval device, the 18 L micro catheter, the [facility] 125 catheter were then retrieved and removed gently while aspiration was continued. The retrieval device was noted to have multiple clots within its struts. The fluffing technique of the retrieval device by gently advancing the micro catheter was used to dislodge the formed clot. Aspirate also contained specks of clot.  The guide catheter which was occluded was then retrieved and removed, after deflation of the proximal flow arrest. A control arteriogram performed through the [place] catheter in the left common carotid artery demonstrated complete angiographic revascularization of the left middle cerebral artery distribution. No filling defects were seen. There were no areas of extravasation of contrast noted. The patient's blood pressure and neurological status remained stable throughout the procedure.  The 8 French neurovascular sheath was then retrieved into the abdominal aorta and exchanged over a J-tip guidewire for a 9 French Pinnacle sheath. This was then successfully removed with the application of an external closure device without difficulty. A pressure dressing was applied to the right groin.  The patient was then transferred to the CT scanner for postprocedural CT scan of the brain.  IMPRESSION: Status post complete angiographic TICI 3, revascularization of occluded left M1 segment proximally using a single pass with a TREVOPROVUE 4 mm x 30 mm stent retrieval device and 3 mg of superselective intracranial intra-arterial Integrelin.   Electronically Signed   By: Luanne Bras M.D.   On: 07/04/2014 10:19   Ir Angio Intra Extracran Sel Internal Carotid Uni L Mod  Sed  07/04/2014   CLINICAL DATA:  Acute onset of right-sided weakness. Aphasia. Left gaze preference.  EXAM: IR ANGIO INTRA EXTRACRAN SEL COM CAROTID INNOMINATE UNI RIGHT MOD SED. BILATERAL COMMON CAROTID ARTERIOGRAMS AND RIGHT VERTEBRAL ARTERY ANGIOGRAM, FOLLOWED BY COMPLETE REVASCULARIZATION OF OCCLUDED LEFT MIDDLE CEREBRAL ARTERY M1 SEGMENT USING MECHANICAL THROMBECTOMY, AND 3 MG OF SUPERSELECTIVE INTRACRANIAL INTRA-ARTERIAL INTEGRELIN.  ANESTHESIA/SEDATION: General anesthesia.  MEDICATIONS: As per anesthesia.  CONTRAST:  100 mL OMNIPAQUE IOHEXOL 300 MG/ML  SOLN  PROCEDURE: Following a full explanation of the procedure along with the potential associated complications, an informed witnessed consent was obtained from the patient's daughter.  The patient was put under general anesthesia by the Department of Anesthesiology at [facility].  The right groin was prepped and draped in the usual sterile fashion. Thereafter using modified Seldinger technique, transfemoral access into the right common femoral artery was obtained without difficulty. Over a 0.035 inch guidewire, a 5 French Pinnacle sheath was inserted.  Through this, and also over a 0.035 inch guidewire, a 5 French JB1 catheter was advanced to the aortic arch region and selectively positioned in the right common carotid artery, the right vertebral artery and the left common carotid artery. There were no acute complications. The patient tolerated the procedure well.  COMPLICATIONS: None immediate.  FINDINGS: The right common carotid arteriogram demonstrates the right external carotid artery and its major branches to be normal. The right internal carotid artery at the bulb to the cranial skull base opacifies normally.  The petrous, the cavernous and the supraclinoid segments are widely patent.  The right vertebral artery origin is normal.  The vessel opacifies normally to the cranial skull base. Normal opacification is seen of the right posterior  inferior cerebellar artery. The visualized basilar artery and the anterior-inferior cerebellar arteries appear normal.  The left common carotid arteriogram demonstrates the left external carotid artery and its major branches to be normal.  The left internal carotid artery at the bulb to the cranial skull base opacifies normally.  The petrous and the cavernous segments are widely patent.  There left anterior cerebral artery is seen to opacify normally into the capillary and venous phases.  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 A large area of hypo to a perfusion is seen involving the left middle cerebral artery distribution.  ENDOVASCULAR COMPLETE REVASCULARIZATION OF OCCLUDED LEFT MIDDLE CEREBRAL ARTERY USING MECHANICAL THROMBECTOMY, AND SUPER SELECTIVE INTRACRANIAL INTRA-ARTERIAL 3 MG OF INTEGRELIN:  The angiographic findings were reviewed with the referring neurologist.  Given the findings of the CT perfusion mips depicting a large area of penumbra involving the left MCA distribution, it was decided to proceed with endovascular revascularization. The option was reviewed with the family. The reason was to improve subsequent neurological recovery.  The risks of intracranial hemorrhage of 10%, worsening neurological deficit, ventilator dependency, and inability to revascularization were all reviewed in detail.  Informed consent was obtained.  The diagnostic JB1 catheter in the left common carotid artery was exchanged over a 0.035 inch 300 cm Rosen exchange guidewire for an 8 French 55 cm Brite tip neurovascular sheath using biplane roadmap technique and constant fluoroscopic guidance. Good aspiration was obtained from the side port of the neurovascular sheath. A gentle contrast injection demonstrated no evidence of spasms, dissections or of intraluminal filling defects.  This was then connected to continuous heparinized saline infusion.  Over the [employer] guidewire, a 90 cm 8 [place]  Flowgate balloon guide catheter, which had been prepped with 50% contrast, and 50% heparinized saline infusion, was advanced to the left common carotid artery and positioned just proximal to the left common carotid artery bifurcation. The guidewire was removed. Good aspiration was obtained from the hub of the 8 French balloon guide catheter a gentle contrast injection demonstrated no evidence of spasms, dissections or of intraluminal filling defects.  Using biplane roadmap technique and constant fluoroscopic guidance, the 8 [place] Flowgate guide catheter was then advanced to the distal vertical segment of the left internal carotid artery over a 0.035 inch Roadrunner guidewire. The guidewire was removed. Good aspiration was obtained from hub of the 8 French balloon guide catheter. Again a gentle contrast injection demonstrated no evidence of spasms, dissections or of intraluminal filling defects.  Again no change was seen in the intracranial circulation.  At this time a combination of an 18 L Merci micro catheter with a DAC 125 catheter was advanced in combination over a 0.014 inch Softip Synchro micro guidewire  to the distal end of the guide catheter. With the micro guidewire leading with a J-tip configuration, the combination was navigated to the supraclinoid left ICA. The J-tip was to prevent spasms or inducing dissections.  The left MCA was entered with the micro guidewire without difficulty and advanced into the distal M3 region of the inferior division of the left middle cerebral artery followed by the micro catheter combination. The guidewire was removed. Aspiration of blood was obtained from the hub of the catheter. A gentle constant injection demonstrated antegrade flow but slow with small filling defects. This prompted the use of 3 mg of superselective intracranial intra-arterial Integrelin.  A 4 mm x 30 mm TREVOPROVUE mechanical thrombectomy device was then purged with heparinized saline infusion in its  housing.  Using biplane roadmap technique and constant fluoroscopic guidance, in a coaxial manner and with constant heparinized saline infusion, this was then advanced to the distal end of the 18 L Merci micro catheter. The O rings on the delivery micro guidewire and the delivery micro catheter were then released. With slight forward gentle traction with the right hand on the delivery micro guidewire, with the left hand the delivery micro catheter was then retrieved unsheathing the standard retrieval device. The tip of the micro catheter was just proximal to the proximal portion of the retrieval device. This was left deployed for approximately 3 and a half minutes. A gentle contrast injection through the 8 [place] guide catheter demonstrated antegrade flow through the stented segment with filling defects within the stent.  At this time the balloon at the distal end of the 8 [place] Flowgate guide catheter was then inflated for proximal flow arrest. The micro catheter was advanced to capture the proximal segment of the retrieval device. Thereafter while supplying gentle aspiration with a 60 mL syringe on the side port of the Tuohy-Borst at the hub of the 8 [place] Flowgate guide catheter, the combination of the retrieval device, the 18 L micro catheter, the [facility] 125 catheter were then retrieved and removed gently while aspiration was continued. The retrieval device was noted to have multiple clots within its struts. The fluffing technique of the retrieval device by gently advancing the micro catheter was used to dislodge the formed clot. Aspirate also contained specks of clot.  The guide catheter which was occluded was then retrieved and removed, after deflation of the proximal flow arrest. A control arteriogram performed through the [place] catheter in the left common carotid artery demonstrated complete angiographic revascularization of the left middle cerebral artery distribution. No filling defects were seen.  There were no areas of extravasation of contrast noted. The patient's blood pressure and neurological status remained stable throughout the procedure.  The 8 French neurovascular sheath was then retrieved into the abdominal aorta and exchanged over a J-tip guidewire for a 9 French Pinnacle sheath. This was then successfully removed with the application of an external closure device without difficulty. A pressure dressing was applied to the right groin.  The patient was then transferred to the CT scanner for postprocedural CT scan of the brain.  IMPRESSION: Status post complete angiographic TICI 3, revascularization of occluded left M1 segment proximally using a single pass with a TREVOPROVUE 4 mm x 30 mm stent retrieval device and 3 mg of superselective intracranial intra-arterial Integrelin.   Electronically Signed   By: Luanne Bras M.D.   On: 07/04/2014 10:19   Ir Neuro Each Add'l After Basic Uni Left (ms)  07/04/2014   CLINICAL  DATA:  Acute onset of right-sided weakness. Aphasia. Left gaze preference.  EXAM: IR ANGIO INTRA EXTRACRAN SEL COM CAROTID INNOMINATE UNI RIGHT MOD SED. BILATERAL COMMON CAROTID ARTERIOGRAMS AND RIGHT VERTEBRAL ARTERY ANGIOGRAM, FOLLOWED BY COMPLETE REVASCULARIZATION OF OCCLUDED LEFT MIDDLE CEREBRAL ARTERY M1 SEGMENT USING MECHANICAL THROMBECTOMY, AND 3 MG OF SUPERSELECTIVE INTRACRANIAL INTRA-ARTERIAL INTEGRELIN.  ANESTHESIA/SEDATION: General anesthesia.  MEDICATIONS: As per anesthesia.  CONTRAST:  100 mL OMNIPAQUE IOHEXOL 300 MG/ML  SOLN  PROCEDURE: Following a full explanation of the procedure along with the potential associated complications, an informed witnessed consent was obtained from the patient's daughter.  The patient was put under general anesthesia by the Department of Anesthesiology at [facility].  The right groin was prepped and draped in the usual sterile fashion. Thereafter using modified Seldinger technique, transfemoral access into the right common  femoral artery was obtained without difficulty. Over a 0.035 inch guidewire, a 5 French Pinnacle sheath was inserted. Through this, and also over a 0.035 inch guidewire, a 5 French JB1 catheter was advanced to the aortic arch region and selectively positioned in the right common carotid artery, the right vertebral artery and the left common carotid artery. There were no acute complications. The patient tolerated the procedure well.  COMPLICATIONS: None immediate.  FINDINGS: The right common carotid arteriogram demonstrates the right external carotid artery and its major branches to be normal. The right internal carotid artery at the bulb to the cranial skull base opacifies normally.  The petrous, the cavernous and the supraclinoid segments are widely patent.  The right vertebral artery origin is normal.  The vessel opacifies normally to the cranial skull base. Normal opacification is seen of the right posterior inferior cerebellar artery. The visualized basilar artery and the anterior-inferior cerebellar arteries appear normal.  The left common carotid arteriogram demonstrates the left external carotid artery and its major branches to be normal.  The left internal carotid artery at the bulb to the cranial skull base opacifies normally.  The petrous and the cavernous segments are widely patent.  There left anterior cerebral artery is seen to opacify normally into the capillary and venous phases.  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 A large area of hypo to a perfusion is seen involving the left middle cerebral artery distribution.  ENDOVASCULAR COMPLETE REVASCULARIZATION OF OCCLUDED LEFT MIDDLE CEREBRAL ARTERY USING MECHANICAL THROMBECTOMY, AND SUPER SELECTIVE INTRACRANIAL INTRA-ARTERIAL 3 MG OF INTEGRELIN:  The angiographic findings were reviewed with the referring neurologist.  Given the findings of the CT perfusion mips depicting a large area of penumbra involving the left MCA  distribution, it was decided to proceed with endovascular revascularization. The option was reviewed with the family. The reason was to improve subsequent neurological recovery.  The risks of intracranial hemorrhage of 10%, worsening neurological deficit, ventilator dependency, and inability to revascularization were all reviewed in detail.  Informed consent was obtained.  The diagnostic JB1 catheter in the left common carotid artery was exchanged over a 0.035 inch 300 cm Rosen exchange guidewire for an 8 French 55 cm Brite tip neurovascular sheath using biplane roadmap technique and constant fluoroscopic guidance. Good aspiration was obtained from the side port of the neurovascular sheath. A gentle contrast injection demonstrated no evidence of spasms, dissections or of intraluminal filling defects.  This was then connected to continuous heparinized saline infusion.  Over the [facility] exchange guidewire, a 90 cm 8 [place] Flowgate balloon guide catheter, which had been prepped with 50% contrast, and 50% heparinized saline  infusion, was advanced to the left common carotid artery and positioned just proximal to the left common carotid artery bifurcation. The guidewire was removed. Good aspiration was obtained from the hub of the 8 French balloon guide catheter a gentle contrast injection demonstrated no evidence of spasms, dissections or of intraluminal filling defects.  Using biplane roadmap technique and constant fluoroscopic guidance, the 8 [place] Flowgate guide catheter was then advanced to the distal vertical segment of the left internal carotid artery over a 0.035 inch Roadrunner guidewire. The guidewire was removed. Good aspiration was obtained from hub of the 8 French balloon guide catheter. Again a gentle contrast injection demonstrated no evidence of spasms, dissections or of intraluminal filling defects.  Again no change was seen in the intracranial circulation.  At this time a combination of an 18 L Merci  micro catheter with a DAC 125 catheter was advanced in combination over a 0.014 inch Softip Synchro micro guidewire to the distal end of the guide catheter. With the micro guidewire leading with a J-tip configuration, the combination was navigated to the supraclinoid left ICA. The J-tip was to prevent spasms or inducing dissections.  The left MCA was entered with the micro guidewire without difficulty and advanced into the distal M3 region of the inferior division of the left middle cerebral artery followed by the micro catheter combination. The guidewire was removed. Aspiration of blood was obtained from the hub of the catheter. A gentle constant injection demonstrated antegrade flow but slow with small filling defects. This prompted the use of 3 mg of superselective intracranial intra-arterial Integrelin.  A 4 mm x 30 mm TREVOPROVUE mechanical thrombectomy device was then purged with heparinized saline infusion in its housing.  Using biplane roadmap technique and constant fluoroscopic guidance, in a coaxial manner and with constant heparinized saline infusion, this was then advanced to the distal end of the 18 L Merci micro catheter. The O rings on the delivery micro guidewire and the delivery micro catheter were then released. With slight forward gentle traction with the right hand on the delivery micro guidewire, with the left hand the delivery micro catheter was then retrieved unsheathing the standard retrieval device. The tip of the micro catheter was just proximal to the proximal portion of the retrieval device. This was left deployed for approximately 3 and a half minutes. A gentle contrast injection through the 8 [place] guide catheter demonstrated antegrade flow through the stented segment with filling defects within the stent.  At this time the balloon at the distal end of the 8 [place] Flowgate guide catheter was then inflated for proximal flow arrest. The micro catheter was advanced to capture the  proximal segment of the retrieval device. Thereafter while supplying gentle aspiration with a 60 mL syringe on the side port of the Tuohy-Borst at the hub of the 8 [place] Flowgate guide catheter, the combination of the retrieval device, the 18 L micro catheter, the [facility] 125 catheter were then retrieved and removed gently while aspiration was continued. The retrieval device was noted to have multiple clots within its struts. The fluffing technique of the retrieval device by gently advancing the micro catheter was used to dislodge the formed clot. Aspirate also contained specks of clot.  The guide catheter which was occluded was then retrieved and removed, after deflation of the proximal flow arrest. A control arteriogram performed through the [place] catheter in the left common carotid artery demonstrated complete angiographic revascularization of the left middle cerebral artery distribution. No  filling defects were seen. There were no areas of extravasation of contrast noted. The patient's blood pressure and neurological status remained stable throughout the procedure.  The 8 French neurovascular sheath was then retrieved into the abdominal aorta and exchanged over a J-tip guidewire for a 9 French Pinnacle sheath. This was then successfully removed with the application of an external closure device without difficulty. A pressure dressing was applied to the right groin.  The patient was then transferred to the CT scanner for postprocedural CT scan of the brain.  IMPRESSION: Status post complete angiographic TICI 3, revascularization of occluded left M1 segment proximally using a single pass with a TREVOPROVUE 4 mm x 30 mm stent retrieval device and 3 mg of superselective intracranial intra-arterial Integrelin.   Electronically Signed   By: Luanne Bras M.D.   On: 07/04/2014 10:19    Labs:  CBC:  Recent Labs  07/03/14 0800 07/03/14 0808 07/03/14 1820 07/04/14 0442  WBC 5.9  --   --  6.6  HGB  11.9* 13.3 9.9* 10.6*  HCT 37.1 39.0 29.0* 34.0*  PLT 201  --   --  196    COAGS:  Recent Labs  07/03/14 0800 07/05/14 0212  INR 1.07 1.19  APTT 26  --     BMP:  Recent Labs  07/03/14 1715 07/03/14 1820 07/04/14 0442 07/04/14 2043 07/05/14 0212  NA 143 146 145 143 144  K 3.5* 2.9* 5.0 4.0 3.5*  CL 108  --  113* 108 111  CO2 22  --  21 21 22   GLUCOSE 98  --  87 108* 99  BUN 12  --  12 9 9   CALCIUM 8.9  --  8.6 8.9 8.6  CREATININE 0.58  --  0.62 0.50 0.45*  GFRNONAA >90  --  >90 >90 >90  GFRAA >90  --  >90 >90 >90    LIVER FUNCTION TESTS:  Recent Labs  07/03/14 0800  BILITOT 1.1  AST 20  ALT 14  ALKPHOS 120*  PROT 6.6  ALBUMIN 3.7    Assessment and Plan:  CVA Clot retrieval 12/8 revasc L MCA doing well    I spent a total of 15 minutes face to face in clinical consultation/evaluation, greater than 50% of which was counseling/coordinating care for L MCA revascularization  Signed: TURPIN,PAMELA A 07/05/2014, 2:52 PM

## 2014-07-05 NOTE — Progress Notes (Addendum)
Pt transferred to 4N01.  Pt alert and oriented.  Pt on room air.   One person assist up to chair.  Some right sided weakness.  Daughter Elmyra Ricks called and informed of transfer.  All pictures, glasses, and belongings transfered with pt.  Gave report to [employer] and all questions answered.

## 2014-07-05 NOTE — Progress Notes (Signed)
   Name: Colleen Williamson MRN: [id number] DOB: January 07, 1946    ADMISSION DATE:  07/03/2014 CONSULTATION DATE:  07/03/14  REFERRING MD :  Dr Nicole Kindred   CHIEF COMPLAINT:  Respiratory failure  BRIEF PATIENT DESCRIPTION:  68 yo woman, hx A Fib, MVR on 06/11/14 for MVP.  Admitted with L MCA CVA am 12/8. To IR for angiogram and revascularization. To ICU on MV.   SIGNIFICANT EVENTS  11/16 MVR [facility]) 12/8 L MCA CVA 12/8 s/p cerebral angio + revascularization    STUDIES:  CT head 12/08:  large area L MCA territory ischemia  MRI 12/9 >> acute L MCA infarct (caudate, putamen, insula, lateral L temporal lobe), MRA > vessel now widely patent   SUBJECTIVE:  Awake and following commands, tolerating PSV  VITAL SIGNS: Temp:  [98.8 F (37.1 C)-100.6 F (38.1 C)] 99.5 F (37.5 C) (12/10 0700) Pulse Rate:  [26-98] 63 (12/10 0700) Resp:  [12-22] 14 (12/10 0700) BP: (100-140)/(51-99) 107/60 mmHg (12/10 0700) SpO2:  [98 %-100 %] 100 % (12/10 0305) Arterial Line BP: (161)/(87) 161/87 mmHg (12/09 1100) FiO2 (%):  [30 %] 30 % (12/10 0400) Weight:  [85.2 kg (187 lb 13.3 oz)] 85.2 kg (187 lb 13.3 oz) (12/10 0400)  PHYSICAL EXAMINATION: General:  RASS 0, intubated Neuro:  R hemiplegia, withdraws RUE and RLE, moves L normally HEENT: NCAT, PERRL Cardiovascular: Freq extrasystoles, + syst M Lungs: Clear Abdomen:  Soft, NT, +BS Ext: no edema, warm   Recent Labs Lab 07/04/14 0442 07/04/14 2043 07/05/14 0212  NA 145 143 144  K 5.0 4.0 3.5*  CL 113* 108 111  CO2 21 21 22   BUN 12 9 9   CREATININE 0.62 0.50 0.45*  GLUCOSE 87 108* 99    Recent Labs Lab 07/03/14 0800 07/03/14 0808 07/03/14 1820 07/04/14 0442  HGB 11.9* 13.3 9.9* 10.6*  HCT 37.1 39.0 29.0* 34.0*  WBC 5.9  --   --  6.6  PLT 201  --   --  196   CXR: small R>L effusions  ASSESSMENT / PLAN: 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 Nicardipine off Monitor BMET intermittently Monitor I/Os D/c Propofol gtt Further eval and mgmt of CVA per Neurology   Independent CC time 40 minutes.   Plan discussed with patient and her daughters at bedside  Baltazar Apo, MD, PhD 07/05/2014, 10:05 AM [facility] [phone] or if no answer [phone]

## 2014-07-05 NOTE — Evaluation (Signed)
Occupational Therapy Evaluation Patient Details Name: Colleen Williamson MRN: [id number] DOB: 08-09-1945 Today's Date: 07/05/2014    History of Present Illness Colleen Williamson is an 68 y.o. female history of mitral valve disease and mitral valve replacement on 06/16/2014, as well as intermittent atrial fibrillation not on anticoagulation, presenting with new onset aphasia and right hemiplegia. MRI: Acute infarct left MCA territory. Infarct involves the body of the caudate but not the head of the caudate. Most of the putamen is involved with a small amount of hemorrhage in the putamen. There is a mild amount of infarct in the insular cortex and a small amount of infarct in the left lateral temporal lobe.   Clinical Impression   This 68 yo female admitted with above presents to acute OT with decreased use of right side, decreased balance, decreased mobility, pre-existing Bil perpherial field loss, decreased awareness of how her deficits are affecting her safety all affecting her ability to be at an independent level as she was pta for BADLs and IADLs. She will benefit from continued OT on acute care with follow up on CIR to get to a point that she is Mod I to return home with intermittent A/S from her daughters.    Follow Up Recommendations  CIR    Equipment Recommendations   (TBD next venue)       Precautions / Restrictions Precautions Precautions: Fall Restrictions Weight Bearing Restrictions: No      Mobility Bed Mobility Overal bed mobility: Needs Assistance Bed Mobility: Supine to Sit     Supine to sit: Min guard;HOB elevated     General bed mobility comments: increased time and cues getting up on right hand side of bed  Transfers Overall transfer level: Needs assistance Equipment used: 2 person hand held assist Transfers: Sit to/from Stand Sit to Stand: Min assist         General transfer comment: See PT's note for ambulation    Balance Overall balance assessment: Needs  assistance Sitting-balance support: Feet supported;Single extremity supported Sitting balance-Leahy Scale: Fair     Standing balance support: Bilateral upper extremity supported Standing balance-Leahy Scale: Fair                              ADL Overall ADL's : Needs assistance/impaired Eating/Feeding: NPO   Grooming: Moderate assistance;Sitting (with attempt to use RUE)   Upper Body Bathing: Moderate assistance;Sitting (with attempt to use RUE)   Lower Body Bathing: Moderate assistance (with min A sit<>stand; with attempt to use RUE)   Upper Body Dressing : Moderate assistance;Sitting (with attempt to use RUE)   Lower Body Dressing: Maximal assistance (with min A sit<>stand; with attempt to use RUE)   Toilet Transfer: Minimal assistance;+2 for physical assistance (Bil HHA (5 steps forward, then backing up 5 steps to recliner)   Toileting- Clothing Manipulation and Hygiene: Moderate assistance (with min A sit<>stand; with attempt to use RUE)               Vision Eye Alignment: Within Functional Limits Alignment/Gaze Preference: Within Defined Limits Ocular Range of Motion: Within Functional Limits Tracking/Visual Pursuits: Able to track stimulus in all quads without difficulty   Convergence: Within functional limits                Pertinent Vitals/Pain Pain Assessment: No/denies pain     Hand Dominance Right   Extremity/Trunk Assessment Upper Extremity Assessment Upper Extremity Assessment: RUE deficits/detail RUE  Deficits / Details: grossly 2/5 RUE Coordination: decreased fine motor;decreased gross motor           Communication Communication Communication: No difficulties   Cognition Arousal/Alertness: Awake/alert Behavior During Therapy: WFL for tasks assessed/performed Overall Cognitive Status: Impaired/Different from baseline Area of Impairment: Problem solving;Safety/judgement         Safety/Judgement: Decreased awareness of  deficits (dragging RLE a bit (1/2 step to each full step of LLE))   Problem Solving: Requires verbal cues General Comments: was having difficulty with left/right sensory discrimination (at times she would say left for right and vis versa, but then would immediately correct herself)              Home Living Family/patient expects to be discharged to:: Private residence Living Arrangements: Children Available Help at Discharge: Family;Available PRN/intermittently Type of Home: House Home Access: Stairs to enter [employer] of Steps: 2 no rail - landing - 4 with rails Entrance Stairs-Rails: None;Right (see above) Home Layout: Two level;Able to live on main level with bedroom/bathroom     Bathroom Shower/Tub: Tub/shower unit;Curtain Shower/tub characteristics: Architectural technologist: Standard     Home Equipment: None          Prior Functioning/Environment Level of Independence: Independent             OT Diagnosis: Generalized weakness;Disturbance of vision;Hemiplegia dominant side   OT Problem List: Decreased strength;Decreased range of motion;Impaired balance (sitting and/or standing);Decreased cognition;Impaired UE functional use;Decreased coordination;Impaired vision/perception;Impaired tone   OT Treatment/Interventions: Self-care/ADL training;Neuromuscular education;DME and/or AE instruction;Therapeutic activities;Balance training;Patient/family education;Therapeutic exercise    OT Goals(Current goals can be found in the care plan section) Acute Rehab OT Goals Patient Stated Goal: to rehab then home OT Goal Formulation: With patient Time For Goal Achievement: 07/12/14 Potential to Achieve Goals: Good  OT Frequency: Min 3X/week           Co-evaluation PT/OT/SLP Co-Evaluation/Treatment: Yes Reason for Co-Treatment: Complexity of the patient's impairments (multi-system involvement);For patient/therapist safety   OT goals addressed during session:  ADL's and self-care      End of Session Nurse Communication: Mobility status  Activity Tolerance: Patient tolerated treatment well Patient left: in chair;with call bell/phone within reach   Time: 7616-0737 OT Time Calculation (min): 37 min Charges:  OT General Charges $OT Visit: 1 Procedure OT Evaluation $Initial OT Evaluation Tier I: 1 Procedure OT Treatments $Self Care/Home Management : 8-22 mins  Almon Register [phone] 07/05/2014, 4:57 PM

## 2014-07-06 DIAGNOSIS — I63412 Cerebral infarction due to embolism of left middle cerebral artery: Principal | ICD-10-CM

## 2014-07-06 DIAGNOSIS — I63039 Cerebral infarction due to thrombosis of unspecified carotid artery: Secondary | ICD-10-CM

## 2014-07-06 LAB — GLUCOSE, CAPILLARY
GLUCOSE-CAPILLARY: 109 mg/dL — AB (ref 70–99)
GLUCOSE-CAPILLARY: 102 mg/dL — AB (ref 70–99)
GLUCOSE-CAPILLARY: 103 mg/dL — AB (ref 70–99)
GLUCOSE-CAPILLARY: 104 mg/dL — AB (ref 70–99)
Glucose-Capillary: 100 mg/dL — ABNORMAL HIGH (ref 70–99)

## 2014-07-06 LAB — BASIC METABOLIC PANEL
Anion gap: 13 (ref 5–15)
BUN: 6 mg/dL (ref 6–23)
CHLORIDE: 106 meq/L (ref 96–112)
CO2: 22 mEq/L (ref 19–32)
CREATININE: 0.44 mg/dL — AB (ref 0.50–1.10)
Calcium: 8.7 mg/dL (ref 8.4–10.5)
GFR calc non Af Amer: >90 mL/min (ref >90)
Glucose, Bld: 104 mg/dL — ABNORMAL HIGH (ref 70–99)
Potassium: 3.5 mEq/L — ABNORMAL LOW (ref 3.7–5.3)
Sodium: 141 mEq/L (ref 137–147)

## 2014-07-06 LAB — MAGNESIUM: Magnesium: 1.7 mg/dL (ref 1.5–2.5)

## 2014-07-06 MED ORDER — PANTOPRAZOLE SODIUM 40 MG PO TBEC
40.0000 mg | DELAYED_RELEASE_TABLET | Freq: Every day | ORAL | Status: DC
  Administered 2014-07-07 – 2014-07-09 (×3): 40 mg via ORAL
  Filled 2014-07-06 (×3): qty 1

## 2014-07-06 MED ORDER — ASPIRIN EC 81 MG PO TBEC
81.0000 mg | DELAYED_RELEASE_TABLET | Freq: Once | ORAL | Status: AC
  Administered 2014-07-06: 81 mg via ORAL
  Filled 2014-07-06: qty 1

## 2014-07-06 MED ORDER — APIXABAN 5 MG PO TABS
5.0000 mg | ORAL_TABLET | Freq: Two times a day (BID) | ORAL | Status: DC
  Administered 2014-07-07 – 2014-07-09 (×5): 5 mg via ORAL
  Filled 2014-07-06 (×5): qty 1

## 2014-07-06 NOTE — Progress Notes (Signed)
Rehab admissions - Evaluated for possible admission.  I met with patient.  She hopes to discharge directly home over the next couple days.  She has a sister who can stay during the day and then her daughter is available evenings.  Patient tells me that her insurance changed on 12/07 and she may have [employer] now.  I will follow up on Monday for progress or to see if patient has been discharged.  Call me for questions.  #[phone]

## 2014-07-06 NOTE — Progress Notes (Signed)
Benefit check in progress for Roosvelt Harps RN,BSN,MHA [phone]

## 2014-07-06 NOTE — Evaluation (Signed)
Speech Language Pathology Evaluation Patient Details Name: Colleen Williamson MRN: [id number] DOB: [birth year] Today's Date: 07/06/2014 Time: 0915-1000 SLP Time Calculation (min) (ACUTE ONLY): 45 min  Problem List:  Patient Active Problem List   Diagnosis Date Noted  . CVA (cerebral infarction) 07/03/2014  . Atrial fibrillation 07/03/2014  . Stroke   . Breast cyst 01/03/2013   Past Medical History:  Past Medical History  Diagnosis Date  . Abnormal mammogram, unspecified 2014  . Breast cyst   . Stroke 07/03/2014   Past Surgical History:  Past Surgical History  Procedure Laterality Date  . Abdominal hysterectomy  2004  . Eye surgery  2007  . Mitral valve repair  05/2014  . Radiology with anesthesia N/A 07/03/2014    Procedure: RADIOLOGY WITH ANESTHESIA;  Surgeon: Rob Hickman, MD;  Location: [facility];  Service: Radiology;  Laterality: N/A;   HPI:  CHIYO FAY is an 68 year old woman with history of mitral valve disease and mitral valve repair on 06/12/2014 at [facility], as well as paroxysmal atrial fibrillation not on anticoagulation, who presented on 12/8 with new onset aphasia and right hemiplegia. Last seen well at 10 PM on 12/7. Her family heard her fall in the bathroom at 7 AM on the morning of her presentation. She was unable to speak and unable to move her right side. CT scan of her head showed decreased gray-white differentiation along the left insular cortex. CT perfusion study showed a large area of left MCA territory ischemia. She was taken to interventional radiology for further evaluation. She underwent revascularization on 12/8. Remained intubated until 12/10.    Assessment / Plan / Recommendation Clinical Impression  Pt demonstrates adequate ability to find words, formulate langauge at conversation level, read paragraphs, follow multistep commands and solve basic functional and verbal problems. Basic memory and safety awareness appear adequate. Pt does however have a somewhat  flat affect and decreased participation in directing care and social interaction, notable to therapist and daughter who reports she is typically quite expressive and opinionated. Question higher level pragmatic/cognitive deficits vs. psychological/emotional change. Encouraged dtr and pt to seek opportunities to participate in functional tasks, direct care via requests and to limit assist by caregivers. SLP will f/u for further higher level assessment.      SLP Assessment  Patient needs continued Speech Lanaguage Pathology Services    Follow Up Recommendations  Outpatient SLP    Frequency and Duration min 2x/week  1 week   Pertinent Vitals/Pain Pain Assessment: No/denies pain   SLP Goals  Progression toward goals: Progressing toward goals Potential to Achieve Goals (ACUTE ONLY): Good  SLP Evaluation Prior Functioning  Cognitive/Linguistic Baseline: Within functional limits Type of Home: House Available Help at Discharge: Family;Available PRN/intermittently   Cognition  Overall Cognitive Status: Impaired/Different from baseline Arousal/Alertness: Awake/alert Orientation Level: Oriented X4 Attention: Alternating Alternating Attention: Appears intact Memory: Appears intact Awareness: Appears intact Problem Solving: Appears intact Executive Function: Initiating Initiating: Impaired Initiating Impairment: Verbal basic;Functional basic Behaviors: Other (comment) (flat affect, not making requests)    Comprehension  Auditory Comprehension Overall Auditory Comprehension: Appears within functional limits for tasks assessed Visual Recognition/Discrimination Discrimination: Within Function Limits Reading Comprehension Reading Status: Within funtional limits    Expression Expression Primary Mode of Expression: Verbal Verbal Expression Overall Verbal Expression: Appears within functional limits for tasks assessed Pragmatics: Impairment Impairments: Other (comment) (decreased  participation in social interaction)   Oral / Motor Oral Motor/Sensory Function Overall Oral Motor/Sensory Function: Appears within functional limits  for tasks assessed Motor Speech Overall Motor Speech: Appears within functional limits for tasks assessed   GO    [facility], MA CCC-SLP [phone]  Lynann Beaver 07/06/2014, 10:19 AM

## 2014-07-06 NOTE — Progress Notes (Signed)
Physical Therapy Treatment Patient Details Name: ALAIJAH GIBLER MRN: [id number] DOB: July 16, 1946 Today's Date: 07/06/2014    History of Present Illness Colleen Williamson is an 68 y.o. female history of mitral valve disease and mitral valve replacement on 06/16/2014, as well as intermittent atrial fibrillation not on anticoagulation, presenting with new onset aphasia and right hemiplegia. MRI: Acute infarct left MCA territory. Infarct involves the body of the caudate but not the head of the caudate. Most of the putamen is involved with a small amount of hemorrhage in the putamen. There is a mild amount of infarct in the insular cortex and a small amount of infarct in the left lateral temporal lobe.    PT Comments    Patient progressing well with mobility. Improved ambulation distance with less assist today and demonstrated more normalized gait pattern with verbal cues to initiate heel strike on right. Balance is slowly improving. Pt will have supervision at home. Discharge recommendation updated due to improvement and progression in therapy to home with 24/7 supervision and HHPT. Will need to negotiate steps prior to discharge. Stair goal added. Will continue to follow acutely.   Follow Up Recommendations  Home health PT;Supervision/Assistance - 24 hour     Equipment Recommendations  Other (comment) (TBD.)    Recommendations for Other Services       Precautions / Restrictions Precautions Precautions: Fall Restrictions Weight Bearing Restrictions: No Other Position/Activity Restrictions: sternal precautions    Mobility  Bed Mobility Overal bed mobility: Needs Assistance Bed Mobility: Supine to Sit     Supine to sit: Supervision;HOB elevated     General bed mobility comments: Supervision for safety. No physical assist required. Use of rails. Cues to adhere to sternal precautions.  Transfers Overall transfer level: Needs assistance Equipment used: None Transfers: Sit to/from  Stand Sit to Stand: Min assist         General transfer comment: Min A to stand with cues to place BUEs on knees and for anterior translation and weightshift to get to standing. Cues to maintain sternal precautions. SPT bed to chair Min guard assist for balance. Stood from chair x5.  Ambulation/Gait Ambulation/Gait assistance: Min assist Ambulation Distance (Feet): 50 Feet (x2 bouts.) Assistive device: None (Rail) Gait Pattern/deviations: Step-through pattern;Decreased dorsiflexion - right;Narrow base of support   Gait velocity interpretation: Below normal speed for age/gender General Gait Details: Min A for balance at times when not using rail for support. Use of rail in hallway intermittently. Constant cues for heel strike during initial contact. Shuffling of right foot and difficulty clearing only noted when fatigued.   Stairs            Wheelchair Mobility    Modified Rankin (Stroke Patients Only) Modified Rankin (Stroke Patients Only) Pre-Morbid Rankin Score:  (Unsure of premorbid status) Modified Rankin: Moderately severe disability     Balance Overall balance assessment: Needs assistance Sitting-balance support: Feet supported;No upper extremity supported Sitting balance-Leahy Scale: Fair Sitting balance - Comments: Able to reach outside BoS and doff/donn socks without difficulty.   Standing balance support: During functional activity Standing balance-Leahy Scale: Fair Standing balance comment: Able to tolerate static standing for short periods but requires at least 1 UE support for dynamic standing.                    Cognition Arousal/Alertness: Awake/alert Behavior During Therapy: WFL for tasks assessed/performed Overall Cognitive Status: Impaired/Different from baseline Area of Impairment: Safety/judgement;Problem solving  Safety/Judgement: Decreased awareness of deficits   Problem Solving: Requires verbal cues General Comments:  Requires repetition of cues.    Exercises Other Exercises Other Exercises: Heel walks sitting in rolling chair to emphasize heel strike and hamstring activation ~50' with cues.    General Comments        Pertinent Vitals/Pain Pain Assessment: No/denies pain    Home Living     Available Help at Discharge: Family;Available PRN/intermittently Type of Home: House              Prior Function            PT Goals (current goals can now be found in the care plan section) Progress towards PT goals: Progressing toward goals    Frequency  Min 4X/week    PT Plan Discharge plan needs to be updated    Co-evaluation             End of Session Equipment Utilized During Treatment: Gait belt Activity Tolerance: Patient tolerated treatment well Patient left: in chair;with call bell/phone within reach;with nursing/sitter in room     Time: 1112-1140 PT Time Calculation (min) (ACUTE ONLY): 28 min  Charges:  $Gait Training: 23-37 mins                    G CodesCandy Sledge A July 28, 2014, 1:14 PM Candy Sledge, [place], DPT [phone]

## 2014-07-06 NOTE — Progress Notes (Signed)
Rehab Admissions Coordinator Note:  Patient was screened by Cleatrice Burke for appropriateness for an Inpatient Acute Rehab Consult per PT recommendation.  At this time, we are recommending Inpatient Rehab consult.  Cleatrice Burke 07/06/2014, 7:57 AM  I can be reached at [phone].

## 2014-07-06 NOTE — Progress Notes (Signed)
ANTICOAGULATION CONSULT NOTE - Initial Consult  Pharmacy Consult:  Eliquis Indication:  Non-valvular Afib  Allergies  Allergen Reactions  . Sulfa Antibiotics Nausea Only    Patient Measurements: Height: 5\' 10"  (177.8 cm) Weight: 187 lb 13.3 oz (85.2 kg) IBW/kg (Calculated) : 68.5  Vital Signs: Temp: 97.9 F (36.6 C) (12/11 1019) Temp Source: Axillary (12/11 1019) BP: 105/52 mmHg (12/11 1019) Pulse Rate: 74 (12/11 1019)  Labs:  Recent Labs  07/03/14 1706  07/03/14 1820 07/03/14 2222 07/04/14 0442 07/04/14 2043 07/05/14 0212 07/06/14 0500  HGB  --   --  9.9*  --  10.6*  --   --   --   HCT  --   --  29.0*  --  34.0*  --   --   --   PLT  --   --   --   --  196  --   --   --   LABPROT  --   --   --   --   --   --  15.2  --   INR  --   --   --   --   --   --  1.19  --   CREATININE  --   < >  --   --  0.62 0.50 0.45* 0.44*  TROPONINI <0.30  --   --  <0.30 <0.30  --   --   --   < > = values in this interval not displayed.  Estimated Creatinine Clearance: 82.1 mL/min (by C-G formula based on Cr of 0.44).   Medical History: Past Medical History  Diagnosis Date  . Abnormal mammogram, unspecified 2014  . Breast cyst   . Stroke 07/03/2014      Assessment: 81 YOF presented as a code stroke and found to have acute left MCA infarct as well as new Afib .  Patient has a history of MV repair not on AC PTA.  MRI showed small amount of hemorrhage of the left putamen.  Pharmacy consulted to initiate Eliquis for non-valvular Afib starting tomorrow 07/07/14.  Based on patient's specific parameters, will initiate full dose of Eliquis.   Goal of Therapy:  Full anticoagulation Monitor platelets by anticoagulation protocol: Yes     Plan:  - On 07/07/14, start Eliquis 5mg  PO BID - Consider resuming home meds and supplement K+ - Pharmacy will sign off and follow peripherally.  Thank you for the consult!    Rajendra Spiller D. Mina Marble, PharmD, BCPS Pager:  [phone] 07/06/2014, 11:36  AM

## 2014-07-06 NOTE — Discharge Instructions (Signed)

## 2014-07-06 NOTE — Progress Notes (Signed)
Unable to do benefit check for Eliquis due to patient is changing her insurance from [facility] to [facility] and has not received her new insurance card in the mail. Coupon for Eliquis given to daughter; Patient wants Outpatient physical therapy at discharge and request [facility]; orders and clinical information faxed; [facility] will contact the patient with a start up date and time for therapy; Aneta Mins [phone]

## 2014-07-06 NOTE — Progress Notes (Signed)
STROKE TEAM PROGRESS NOTE   HISTORY Colleen Williamson is an 68 year old woman with history of mitral valve disease and mitral valve repair on 06/12/2014 at [facility], as well as paroxysmal atrial fibrillation not on anticoagulation, who presented on 12/8 with new onset aphasia and right hemiplegia. There is no previous history of stroke nor TIA. She has been on aspirin daily. Exact time of onset is unclear, last seen well at 10 PM on 12/7. Her family heard her fall in the bathroom at 7 AM on the morning of her presentation. She was unable to speak and unable to move her right side. CT scan of her head showed decreased gray-white differentiation along the left insular cortex. CT perfusion study showed a large area of left MCA territory ischemia reduced mean transit time and reduce cerebral flow. Blood volume however was mostly preserved. She was taken to interventional radiology for further evaluation. Cerebral angiogram showed left MCA M1 occlusion. NIH stroke score was 25. She underwent revascularization at 10:06 AM on 12/8 of the occluded artery with intra-arterial Integrelin.   Patient was not administered tPA secondary to open heart surgery with mitral valve replacement on 06/16/14, in addition she was outside the window for tPA. Marland Kitchen She was admitted to the neuro ICU for further evaluation and treatment.   SUBJECTIVE (INTERVAL HISTORY) Her daughters and other family were at the bedside.  Overall her condition is much improved. Her right hand weakness continues to improve.   OBJECTIVE Temp:  [97.9 F (36.6 C)-98.5 F (36.9 C)] 98.5 F (36.9 C) (12/11 1742) Pulse Rate:  [74-117] 79 (12/11 1742) Cardiac Rhythm:  [-] Normal sinus rhythm (12/10 2005) Resp:  [16-18] 16 (12/11 1742) BP: (102-127)/(52-67) 112/66 mmHg (12/11 1742) SpO2:  [95 %-98 %] 95 % (12/11 1742)   Recent Labs Lab 07/06/14 0001 07/06/14 0405 07/06/14 0839 07/06/14 1114 07/06/14 1702  GLUCAP 114* 109* 102* 103* 104*    Recent  Labs Lab 07/03/14 1715 07/03/14 1820 07/04/14 0442 07/04/14 2043 07/05/14 0212 07/05/14 1958 07/06/14 0500  NA 143 146 145 143 144  --  141  K 3.5* 2.9* 5.0 4.0 3.5*  --  3.5*  CL 108  --  113* 108 111  --  106  CO2 22  --  21 21 22   --  22  GLUCOSE 98  --  87 108* 99  --  104*  BUN 12  --  12 9 9   --  6  CREATININE 0.58  --  0.62 0.50 0.45*  --  0.44*  CALCIUM 8.9  --  8.6 8.9 8.6  --  8.7  MG 2.0  --   --   --   --  1.8 1.7  PHOS 3.2  --   --   --   --   --   --     Recent Labs Lab 07/03/14 0800  AST 20  ALT 14  ALKPHOS 120*  BILITOT 1.1  PROT 6.6  ALBUMIN 3.7    Recent Labs Lab 07/03/14 0800 07/03/14 0808 07/03/14 1820 07/04/14 0442  WBC 5.9  --   --  6.6  NEUTROABS 3.5  --   --  4.7  HGB 11.9* 13.3 9.9* 10.6*  HCT 37.1 39.0 29.0* 34.0*  MCV 83.4  --   --  85.4  PLT 201  --   --  196    Recent Labs Lab 07/03/14 1706 07/03/14 2222 07/04/14 0442  TROPONINI <0.30 <0.30 <0.30  Recent Labs  07/05/14 0212  LABPROT 15.2  INR 1.19        Component Value Date/Time   CHOL 132 07/04/2014 0442   TRIG 153* 07/04/2014 0442   HDL 46 07/04/2014 0442   CHOLHDL 2.9 07/04/2014 0442   VLDL 31 07/04/2014 0442   LDLCALC 55 07/04/2014 0442   Lab Results  Component Value Date   HGBA1C 5.6 07/04/2014     Recent Labs Lab 07/03/14 0800  ETH <11    PHYSICAL EXAM General: Sitting up in bed, in NAD. Hoarseness. Afebrile. Head is nontraumatic. Neck is supple without bruit. Lungs are clear to auscultation bilaterally.Cardiac exam no murmur or gallop. Lungs are clear to auscultation. Distal pulses are well felt. Neurologic Examination: Mental Status:  Alert, following commands. Mild dysarthria.  Cranial Nerves: III/IV/VI-Pupils were equal and reacted to light. Slight conjugate gaze deviation to the left side but eyes move beyond midline to right. Intact EOM.  V/VII-mild right lower facial weakness. X, XII- gag reflex present Motor: Mild weakness of  right arm, more pronounced in right hand with normal muscle tone; normal strength and tone of right LE; normal tone and strength of left extremities. Diminished fine motor skills in the right hand. Mild right grip weakness. Sensory: Normal sensation on light touch in UE and LE bilaterally Plantars: Flexor bilaterally Cerebellar: Normal finger finger-to-nose with left hand, slow with the right.   ASSESSMENT/PLAN Colleen Williamson is a 68 y.o. woman with history of A. 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  She did not receive IV t-PA due to recent surgery and being outside the tPA window but underwent successful revascularization with intra-arterial intergrelin and mechanical embolectomy with Trevo provue device.   Stroke: Non-dominant left infarct embolic/occulsion secondary to MCA M1 occlusion.  Resultant  Right hemiplegia  MRI  Acute left MCA infarct involving the body of the caudate, putamen, insula, and lateral temporal lobe. Small amount of hemorrhage of the left putamen.   MRA  Successful recanalization of the left M1 segment which is widely patent. No branch occlusion on the left. Mild intracranial atherosclerotic disease.   Carotid Doppler  Ordered  2D Echo: no source of emboli  CT head wo contrast: Post retrieval of left M1 segment thrombus. Left middle cerebral artery distribution infarct  CT cerebral perfusion: prior to revascularization, large area of Left MCA territory ischemia with decreased mean transit time and decreased cerebral blood blood. Cerebral blood flow is mostly preserved, suggesting this may be reversible.   LDL 55  HgbA1c 5.6%  SCDs for VTE prophylaxis  Diet Heart healthy, thin liquids  aspirin 81 mg orally every day prior to admission, she is now 24 hours after revascularization for anticoagulation--she is a good candidate for the novel anticoagulation agents. Currently  receiving ASA will start Eliquis on 12/12.   Ongoing aggressive stroke risk factor management   Disposition:  Pending. Much improved motor skills, may not meet criteria for inpatient Rehab. May go home tomorrow with [facility] PT and outpatient SLP .   Hypertension  Home meds:   Metoprolol 25mg  XL  Stable  Hyperlipidemia, no hx of  Home meds:  None  LDL 55, goal < 70  Diabetes -No hx of diabetes  HgbA1c 5.6% goal < 7.0  Other Stroke Risk Factors  Advanced age  A. fib  Other Active Problems  Recent open heart surgery for MV prolapse repair, not on  anticoagulation   Paroxysmal A. Fib not on anticoagulation   Hospital day # 3 I have personally examined this patient, reviewed notes, independently viewed imaging studies, participated in medical decision making and plan of care. I have made any additions or clarifications directly to the above note. Agree with note above. Await rehab transfer after insurance approval or Dc home with HHPTOT if too good for rehab. D/W daughter and patient and answered questions. Antony Contras, MD Medical Director [facility] Pager: [phone] 07/06/2014 10:06 PM  To contact Stroke Continuity provider, please refer to [email or web address] After hours, contact General Neurology

## 2014-07-06 NOTE — Consult Note (Signed)
Physical Medicine and Rehabilitation Consult Reason for Consult: CVA Referring Physician: Dr. Leonie Man   [name] is a 68 y.o. right handed female with history of mitral valve disease with mitral valve replacement 06/16/2014 maintained on aspirin therapy 81 mg daily. Patient lives with family and was independent prior to admission. Presented 07/03/2014 with right sided weakness and aphasia. EKG new onset atrial fibrillation. Troponin remained negative. MRI of the brain showed acute left MCA infarct. Patient did not receive TPA. Cerebral angiogram showed left MCA M1 occlusion. Underwent revascularization 07/03/2014 per interventional radiology. Echocardiogram with ejection fraction of 41% grade 2 diastolic dysfunction. Neurology services consulted currently maintained on aspirin therapy. Patient is currently nothing by mouth with nasogastric tube in place with follow-up for speech therapy plan would be to begin Eliquis when she passes her swallow evaluation. Physical and occupational therapy evaluation completed 07/05/2014 with recommendations of physical medicine rehabilitation consult.   Review of Systems  Cardiovascular: Positive for palpitations.  Gastrointestinal: Positive for constipation.  Musculoskeletal: Positive for myalgias.  Neurological: Positive for dizziness.  Psychiatric/Behavioral: Positive for depression.  All other systems reviewed and are negative.  Past Medical History  Diagnosis Date  . Abnormal mammogram, unspecified 2014  . Breast cyst   . Stroke 07/03/2014   Past Surgical History  Procedure Laterality Date  . Abdominal hysterectomy  2004  . Eye surgery  2007  . Mitral valve repair  05/2014  . Radiology with anesthesia N/A 07/03/2014    Procedure: RADIOLOGY WITH ANESTHESIA;  Surgeon: Rob Hickman, MD;  Location: [place];  Service: Radiology;  Laterality: N/A;   History reviewed. No pertinent family history. Social History:  reports that she  has quit smoking. She has never used smokeless tobacco. She reports that she does not drink alcohol or use illicit drugs. Allergies:  Allergies  Allergen Reactions  . Sulfa Antibiotics Nausea Only   Medications Prior to Admission  Medication Sig Dispense Refill  . aspirin EC 81 MG tablet Take 81 mg by mouth daily.    . citalopram (CELEXA) 10 MG tablet Take 10 mg by mouth daily.    Marland Kitchen diltiazem (CARDIZEM) 90 MG tablet Take 90 mg by mouth 4 (four) times daily.    . furosemide (LASIX) 20 MG tablet Take 20 mg by mouth daily.    . meclizine (ANTIVERT) 25 MG tablet Take 1 mg by mouth daily.    . metoprolol succinate (TOPROL-XL) 25 MG 24 hr tablet Take 25 mg by mouth daily.    Marland Kitchen oxyCODONE-acetaminophen (PERCOCET/ROXICET) 5-325 MG per tablet Take 0.5-1 tablets by mouth every 4 (four) hours as needed for severe pain.      Home: Home Living Family/patient expects to be discharged to:: Private residence Living Arrangements: Children Available Help at Discharge: Family, Available PRN/intermittently Type of Home: House Home Access: Stairs to enter [employer] of Steps: 2 no rail - landing - 4 with rails Entrance Stairs-Rails: None, Right (see above) Home Layout: Two level, Able to live on main level with bedroom/bathroom Home Equipment: None  Functional History: Prior Function Level of Independence: Independent Functional Status:  Mobility: Bed Mobility Overal bed mobility: Needs Assistance Bed Mobility: Supine to Sit Supine to sit: Min guard, HOB elevated General bed mobility comments: increased time and cues getting up on right hand side of bed. VC for technique Transfers Overall transfer level: Needs assistance Equipment used: 2 person hand held assist Transfers: Sit to/from Stand Sit to Stand: Min assist General transfer  comment: Min assist for balance to stand. Cues for technique and to maintain sternal precautions. Ambulation/Gait Ambulation/Gait assistance: Min  assist, +2 physical assistance Ambulation Distance (Feet): 8 Feet Assistive device: 2 person hand held assist Gait Pattern/deviations: Step-to pattern, Decreased step length - right, Decreased stance time - right, Decreased stride length, Decreased dorsiflexion - right, Narrow base of support Gait velocity interpretation: Below normal speed for age/gender General Gait Details: Min assist +2 for balance and moderate cues for sequencing. Pt with narrow BOS and dragging RLE during swing limb advancement, unable to clear foot unless cued; then she is able to physically lift foot from ground. Fairly stable with bil UE support but was not pushing heavily through arms.    ADL: ADL Overall ADL's : Needs assistance/impaired Eating/Feeding: NPO Grooming: Moderate assistance, Sitting (with attempt to use RUE) Upper Body Bathing: Moderate assistance, Sitting (with attempt to use RUE) Lower Body Bathing: Moderate assistance (with min A sit<>stand; with attempt to use RUE) Upper Body Dressing : Moderate assistance, Sitting (with attempt to use RUE) Lower Body Dressing: Maximal assistance (with min A sit<>stand; with attempt to use RUE) Toilet Transfer: Minimal assistance, +2 for physical assistance (Bil HHA (5 steps forward, then backing up 5 steps to recliner) Toileting- Clothing Manipulation and Hygiene: Moderate assistance (with min A sit<>stand; with attempt to use RUE)  Cognition: Cognition Overall Cognitive Status: Impaired/Different from baseline Orientation Level: Oriented X4 Cognition Arousal/Alertness: Awake/alert Behavior During Therapy: WFL for tasks assessed/performed Overall Cognitive Status: Impaired/Different from baseline Area of Impairment: Problem solving, Safety/judgement Safety/Judgement: Decreased awareness of deficits (dragging RLE a bit (1/2 step to each full step of LLE)) Problem Solving: Requires verbal cues General Comments: was having difficulty with left/right sensory  discrimination (at times she would say left for right and vis versa, but then would immediately correct herself)  Blood pressure 114/67, pulse 76, temperature 98.1 F (36.7 C), temperature source Oral, resp. rate 16, height 5\' 10"  (1.778 m), weight 85.2 kg (187 lb 13.3 oz), SpO2 98 %. Physical Exam  Constitutional: She appears well-developed.  HENT:  Head: Normocephalic.  Eyes: EOM are normal.  Neck: Normal range of motion. Neck supple. No thyromegaly present.  Cardiovascular:  Cardiac rate controlled  Respiratory: Effort normal and breath sounds normal. No respiratory distress.  GI: Soft. Bowel sounds are normal. She exhibits no distension.  Neurological: She is alert.  Patient makes good eye contact with examiner. Mood is a bit flat but appropriate. She is able to provide her name, age date of birth and appropriate month of the year. She follows simple commands. Has word finding deficits but able to converse. RUE: grossly 4+ to 5/5. RLE: 4/5 HF 4+ ke and ankles. No sensory deficits. Reasonable insight and awareness  Skin: Skin is warm and dry.  Psychiatric: She has a normal mood and affect. Her behavior is normal.    Results for orders placed or performed during the hospital encounter of 07/03/14 (from the past 24 hour(s))  Glucose, capillary     Status: None   Collection Time: 07/05/14 12:43 PM  Result Value Ref Range   Glucose-Capillary 97 70 - 99 mg/dL  Glucose, capillary     Status: None   Collection Time: 07/05/14  3:54 PM  Result Value Ref Range   Glucose-Capillary 87 70 - 99 mg/dL  Magnesium today at 2000     Status: None   Collection Time: 07/05/14  7:58 PM  Result Value Ref Range   Magnesium 1.8 1.5 - 2.5  mg/dL  Glucose, capillary     Status: Abnormal   Collection Time: 07/05/14  8:03 PM  Result Value Ref Range   Glucose-Capillary 102 (H) 70 - 99 mg/dL   Comment 1 Documented in Chart    Comment 2 Notify RN   Glucose, capillary     Status: Abnormal   Collection Time:  07/06/14 12:01 AM  Result Value Ref Range   Glucose-Capillary 114 (H) 70 - 99 mg/dL   Comment 1 Documented in Chart    Comment 2 Notify RN   Glucose, capillary     Status: Abnormal   Collection Time: 07/06/14  4:05 AM  Result Value Ref Range   Glucose-Capillary 109 (H) 70 - 99 mg/dL   Comment 1 Documented in Chart    Comment 2 Notify RN   BMET in AM     Status: Abnormal   Collection Time: 07/06/14  5:00 AM  Result Value Ref Range   Sodium 141 137 - 147 mEq/L   Potassium 3.5 (L) 3.7 - 5.3 mEq/L   Chloride 106 96 - 112 mEq/L   CO2 22 19 - 32 mEq/L   Glucose, Bld 104 (H) 70 - 99 mg/dL   BUN 6 6 - 23 mg/dL   Creatinine, Ser 0.44 (L) 0.50 - 1.10 mg/dL   Calcium 8.7 8.4 - 10.5 mg/dL   GFR calc non Af Amer >90 >90 mL/min   GFR calc Af Amer >90 >90 mL/min   Anion gap 13 5 - 15  Magnesium in AM     Status: None   Collection Time: 07/06/14  5:00 AM  Result Value Ref Range   Magnesium 1.7 1.5 - 2.5 mg/dL   Mr Brain Wo Contrast  07/04/2014   CLINICAL DATA:  Left MCA stroke.  Left M1 clot retrieval 07/03/2014  EXAM: MRI HEAD WITHOUT CONTRAST  MRA HEAD WITHOUT CONTRAST  TECHNIQUE: Multiplanar, multiecho pulse sequences of the brain and surrounding structures were obtained without intravenous contrast. Angiographic images of the head were obtained using MRA technique without contrast.  COMPARISON:  CT 07/03/2014  FINDINGS: MRI HEAD FINDINGS  Acute infarct left MCA territory. Infarct involves the body of the caudate but not the head of the caudate. Most of the putamen is involved with a small amount of hemorrhage in the putamen. There is a mild amount of infarct in the insular cortex and a small amount of infarct in the left lateral temporal lobe. No other areas of acute infarct or hemorrhage identified.  Ventricle size is normal.  No shift of the midline structures.  No chronic ischemic changes are identified. Right hemispheres normal. Brainstem and cerebellum are normal.  Negative for mass lesion.   MRA HEAD FINDINGS  Two acquisitions were obtained. The second acquisition is of better quality.  Both vertebral arteries are patent to the basilar. Basilar widely patent. Superior cerebellar and posterior cerebral arteries are patent.  Right internal carotid artery widely patent. Right anterior and middle cerebral arteries are patent. Mild atherosclerotic disease in the right middle cerebral artery M1 segment and branches.  Left internal carotid artery widely patent. Left anterior cerebral artery widely patent. Left M1 segment is patent without significant stenosis. Left middle cerebral artery branches show mild irregularity without significant stenosis or branch occlusion.  Negative for cerebral aneurysm.  IMPRESSION: Acute left MCA infarct involving the body the caudate comminuted putamen, insula, and lateral temporal lobe as above. Small amount of hemorrhage in the left putamen.  MRA reveals successful re- cannulization of  left M1 segment which is widely patent. No branch occlusion identified on the left. Mild intracranial atherosclerotic disease is present.   Electronically Signed   By: Franchot Gallo M.D.   On: 07/04/2014 16:01   [name] Nam Head/brain Wo Cm  07/04/2014   CLINICAL DATA:  Left MCA stroke.  Left M1 clot retrieval 07/03/2014  EXAM: MRI HEAD WITHOUT CONTRAST  MRA HEAD WITHOUT CONTRAST  TECHNIQUE: Multiplanar, multiecho pulse sequences of the brain and surrounding structures were obtained without intravenous contrast. Angiographic images of the head were obtained using MRA technique without contrast.  COMPARISON:  CT 07/03/2014  FINDINGS: MRI HEAD FINDINGS  Acute infarct left MCA territory. Infarct involves the body of the caudate but not the head of the caudate. Most of the putamen is involved with a small amount of hemorrhage in the putamen. There is a mild amount of infarct in the insular cortex and a small amount of infarct in the left lateral temporal lobe. No other areas of acute infarct or  hemorrhage identified.  Ventricle size is normal.  No shift of the midline structures.  No chronic ischemic changes are identified. Right hemispheres normal. Brainstem and cerebellum are normal.  Negative for mass lesion.  MRA HEAD FINDINGS  Two acquisitions were obtained. The second acquisition is of better quality.  Both vertebral arteries are patent to the basilar. Basilar widely patent. Superior cerebellar and posterior cerebral arteries are patent.  Right internal carotid artery widely patent. Right anterior and middle cerebral arteries are patent. Mild atherosclerotic disease in the right middle cerebral artery M1 segment and branches.  Left internal carotid artery widely patent. Left anterior cerebral artery widely patent. Left M1 segment is patent without significant stenosis. Left middle cerebral artery branches show mild irregularity without significant stenosis or branch occlusion.  Negative for cerebral aneurysm.  IMPRESSION: Acute left MCA infarct involving the body the caudate comminuted putamen, insula, and lateral temporal lobe as above. Small amount of hemorrhage in the left putamen.  MRA reveals successful re- cannulization of left M1 segment which is widely patent. No branch occlusion identified on the left. Mild intracranial atherosclerotic disease is present.   Electronically Signed   By: Franchot Gallo M.D.   On: 07/04/2014 16:01    Assessment/Plan: Diagnosis: left MCA infarct 1. Does the need for close, 24 hr/day medical supervision in concert with the patient's rehab needs make it unreasonable for this patient to be served in a less intensive setting? No and Potentially 2. Co-Morbidities requiring supervision/potential complications: afib 3. Due to bladder management, bowel management, safety, skin/wound care, disease management, medication administration, pain management and patient education, does the patient require 24 hr/day rehab nursing? No and Potentially 4. Does the patient  require coordinated care of a physician, rehab nurse, PT (1-2 hrs/day, 5 days/week), OT (1-2 hrs/day, 5 days/week) and SLP (1-2 hrs/day, 5 days/week) to address physical and functional deficits in the context of the above medical diagnosis(es)? Yes Addressing deficits in the following areas: balance, endurance, locomotion, strength, transferring, bowel/bladder control, bathing, dressing, feeding, grooming, toileting, language, swallowing and psychosocial support 5. Can the patient actively participate in an intensive therapy program of at least 3 hrs of therapy per day at least 5 days per week? Yes 6. The potential for patient to make measurable gains while on inpatient rehab is fair 7. Anticipated functional outcomes upon discharge from inpatient rehab are modified independent  with PT, modified independent with OT, modified independent and supervision with SLP. 8. Estimated rehab length  of stay to reach the above functional goals is: less than 7 days if needed 9. Does the patient have adequate social supports and living environment to accommodate these discharge functional goals? Yes 10. Anticipated D/C setting: Home 11. Anticipated post D/C treatments: HH therapy and Outpatient therapy 12. Overall Rehab/Functional Prognosis: excellent  RECOMMENDATIONS: This patient's condition is appropriate for continued rehabilitative care in the following setting: CIR and HH Therapy Patient has agreed to participate in recommended program. Potentially Note that insurance prior authorization may be required for reimbursement for recommended care.  Comment: Motorically, this patient looks very good. Swallowing appears to be the major issue. Will follow for progress, but may not be able to justify need for inpatient rehab.  Meredith Staggers, MD, [facility] 07/06/2014     07/06/2014

## 2014-07-06 NOTE — Progress Notes (Signed)
Speech Language Pathology Treatment: Dysphagia  Patient Details Name: BENTLY MORATH MRN: [id number] DOB: 27-Jun-1946 Today's Date: 07/06/2014 Time: 6803-2122 SLP Time Calculation (min) (ACUTE ONLY): 8 min  Assessment / Plan / Recommendation Clinical Impression  Pt seen again today after NG out, Still tolerating thin liquids without evidence of aspiration. SLP provided trials of regular solids which pt again tolerated without difficulty. Recommend pt upgrade to regular solids and thin liquids. No SLP f/u needed for swallowing. Will continue to follow for cognition.    HPI HPI: RAILYN HOUSE is an 68 year old woman with history of mitral valve disease and mitral valve repair on 06/12/2014 at [facility], as well as paroxysmal atrial fibrillation not on anticoagulation, who presented on 12/8 with new onset aphasia and right hemiplegia. Last seen well at 10 PM on 12/7. Her family heard her fall in the bathroom at 7 AM on the morning of her presentation. She was unable to speak and unable to move her right side. CT scan of her head showed decreased gray-white differentiation along the left insular cortex. CT perfusion study showed a large area of left MCA territory ischemia. She was taken to interventional radiology for further evaluation. She underwent revascularization on 12/8. Remained intubated until 12/10.    Pertinent Vitals Pain Assessment: No/denies pain  SLP Plan  All goals met (for swallowing)    Recommendations Diet recommendations: Regular;Thin liquid Liquids provided via: Cup;Straw Medication Administration: Whole meds with liquid Supervision: Staff to assist with self feeding Postural Changes and/or Swallow Maneuvers: Seated upright 90 degrees;Out of bed for meals              Oral Care Recommendations: Oral care BID Follow up Recommendations: Outpatient SLP Plan: All goals met (for swallowing)    GO    Herbie Baltimore, MA CCC-SLP [phone]  Lynann Beaver 07/06/2014, 1:54  PM

## 2014-07-06 NOTE — Evaluation (Signed)
Clinical/Bedside Swallow Evaluation Patient Details  Name: SHAYLEA UCCI MRN: [id number] Date of Birth: 29-Oct-1945  Today's Date: 07/06/2014 Time: 0915-1000 SLP Time Calculation (min) (ACUTE ONLY): 45 min  Past Medical History:  Past Medical History  Diagnosis Date  . Abnormal mammogram, unspecified 2014  . Breast cyst   . Stroke 07/03/2014   Past Surgical History:  Past Surgical History  Procedure Laterality Date  . Abdominal hysterectomy  2004  . Eye surgery  2007  . Mitral valve repair  05/2014  . Radiology with anesthesia N/A 07/03/2014    Procedure: RADIOLOGY WITH ANESTHESIA;  Surgeon: Rob Hickman, MD;  Location: [place];  Service: Radiology;  Laterality: N/A;   HPI:  LUCEIL HERRIN is an 68 year old woman with history of mitral valve disease and mitral valve repair on 06/12/2014 at [facility], as well as paroxysmal atrial fibrillation not on anticoagulation, who presented on 12/8 with new onset aphasia and right hemiplegia. Last seen well at 10 PM on 12/7. Her family heard her fall in the bathroom at 7 AM on the morning of her presentation. She was unable to speak and unable to move her right side. CT scan of her head showed decreased gray-white differentiation along the left insular cortex. CT perfusion study showed a large area of left MCA territory ischemia. She was taken to interventional radiology for further evaluation. She underwent revascularization on 12/8. Remained intubated until 12/10.    Assessment / Plan / Recommendation Clinical Impression  Pt demonstrates adequate swallow function with excellent timing, strength and no evidence of aspiration. Pt does have large bore NG still in place thus solids were not tested. Anticipate that pts swallow is WNL, but will f/u once more this pm after tube is out to observe pt with solids and ensure tolerance. See cognitive linguistic evaluation as well.     Aspiration Risk  Mild    Diet Recommendation Dysphagia 2 (Fine chop);Thin  liquid   Liquid Administration via: Cup;Straw Medication Administration: Whole meds with liquid (after NG out) Supervision: Staff to assist with self feeding (Pt can feed self may need min assist) Postural Changes and/or Swallow Maneuvers: Seated upright 90 degrees;Out of bed for meals    Other  Recommendations Oral Care Recommendations: Oral care BID   Follow Up Recommendations       Frequency and Duration min 1 x/week  1 week   Pertinent Vitals/Pain NA    SLP Swallow Goals     Swallow Study Prior Functional Status       General HPI: AMARIYA LISKEY is an 68 year old woman with history of mitral valve disease and mitral valve repair on 06/12/2014 at [facility], as well as paroxysmal atrial fibrillation not on anticoagulation, who presented on 12/8 with new onset aphasia and right hemiplegia. Last seen well at 10 PM on 12/7. Her family heard her fall in the bathroom at 7 AM on the morning of her presentation. She was unable to speak and unable to move her right side. CT scan of her head showed decreased gray-white differentiation along the left insular cortex. CT perfusion study showed a large area of left MCA territory ischemia. She was taken to interventional radiology for further evaluation. She underwent revascularization on 12/8. Remained intubated until 12/10.  Type of Study: Bedside swallow evaluation Diet Prior to this Study: NPO (large NG) Respiratory Status: Room air History of Recent Intubation: Yes Length of Intubations (days): 3 days Date extubated: 07/05/14 Behavior/Cognition: Alert;Cooperative;Pleasant mood Oral Cavity -  Dentition: Adequate natural dentition Self-Feeding Abilities: Able to feed self Patient Positioning: Upright in bed Baseline Vocal Quality: Clear Volitional Cough: Strong Volitional Swallow: Able to elicit    Oral/Motor/Sensory Function Overall Oral Motor/Sensory Function: Appears within functional limits for tasks assessed   Ice Chips     Thin Liquid  Thin Liquid: Within functional limits    Nectar Thick Nectar Thick Liquid: Not tested   Honey Thick Honey Thick Liquid: Not tested   Puree Puree: Within functional limits   Solid   GO    Solid: Not tested Other Comments: large NG in place, await removal to test solids      Herbie Baltimore, MA CCC-SLP [phone]  Lynann Beaver 07/06/2014,10:08 AM

## 2014-07-07 DIAGNOSIS — I63032 Cerebral infarction due to thrombosis of left carotid artery: Secondary | ICD-10-CM

## 2014-07-07 NOTE — Progress Notes (Signed)
Physical Therapy Treatment Patient Details Name: Colleen Williamson MRN: [id number] DOB: [birth year] Today's Date: 07/07/2014    History of Present Illness Colleen Williamson is an 68 y.o. female history of mitral valve disease and mitral valve replacement on 06/16/2014, as well as intermittent atrial fibrillation not on anticoagulation, presenting with new onset aphasia and right hemiplegia. MRI: Acute infarct left MCA territory. Infarct involves the body of the caudate but not the head of the caudate. Most of the putamen is involved with a small amount of hemorrhage in the putamen. There is a mild amount of infarct in the insular cortex and a small amount of infarct in the left lateral temporal lobe.    PT Comments    Patient making some improvements with mobility and gait.  However continues to require min - mod assist for gait and stairs.  Patient lives alone.  Will need to have 24 hour assist at discharge for safety, and recommend HHPT for f/u therapy.  If unable to have 24 hour assist, would recommend inpatient setting of CIR to achieve modified I level.  Will return tomorrow am for continued therapy and assessment.   Follow Up Recommendations  Home health PT;Supervision/Assistance - 24 hour     Equipment Recommendations  Rolling walker with 5" wheels    Recommendations for Other Services       Precautions / Restrictions Precautions Precautions: Fall Restrictions Weight Bearing Restrictions: No Other Position/Activity Restrictions: sternal precautions    Mobility  Bed Mobility Overal bed mobility: Needs Assistance Bed Mobility: Supine to Sit;Sit to Supine     Supine to sit: Supervision;HOB elevated Sit to supine: Supervision   General bed mobility comments: Supervision for safety. No physical assist required. Use of rails. Cues to adhere to sternal precautions.  Transfers Overall transfer level: Needs assistance Equipment used: None Transfers: Sit to/from Stand Sit to Stand:  Min assist         General transfer comment: Verbal cues for sternal precautions.  Assist for balance/safety.  Ambulation/Gait Ambulation/Gait assistance: Min assist Ambulation Distance (Feet): 150 Feet Assistive device: None;Rolling walker (2 wheeled) Gait Pattern/deviations: Step-through pattern;Decreased stance time - right;Decreased step length - left;Decreased weight shift to right;Staggering right Gait velocity: Decreased Gait velocity interpretation: Below normal speed for age/gender General Gait Details: Patient ambulated 87' with no assistive device, requiring min assist for balance and safety.  Patient drifting to right side.  Noted decreased control of RLE in stance and in swing phases of gait.  Patient then ambulated 100' with RW with min assist at times for balance.  Patient with difficulty maneuvering around obstacles, and when turning - feet get outside of RW, or RW tips to side.  Assist to prevent loss of balance/falls.   Stairs Stairs: Yes Stairs assistance: Mod assist Stair Management: No rails;One rail Right;Step to pattern;Forwards Number of Stairs: 2 General stair comments: Instructed patient to negotiate steps using step-to pattern.  Instructed daughter and son-in-law on correct guarding technique.  Patient required mod assist, especially coming down the steps to prevent loss of balance.  Wheelchair Mobility    Modified Rankin (Stroke Patients Only) Modified Rankin (Stroke Patients Only) Pre-Morbid Rankin Score: No symptoms Modified Rankin: Moderately severe disability     Balance Overall balance assessment: Needs assistance         Standing balance support: During functional activity Standing balance-Leahy Scale: Fair Standing balance comment: Able to tolerate static standing for short periods but requires at least 1 UE support for dynamic standing. Single  Leg Stance - Right Leg: 1 Single Leg Stance - Left Leg: 6     Rhomberg - Eyes Opened: 30  (with sway and weight shifted to LLE) Rhomberg - Eyes Closed: 18 High level balance activites: Head turns High Level Balance Comments: Staggers with attempts to turn head during gait without AD.    Cognition Arousal/Alertness: Awake/alert Behavior During Therapy: WFL for tasks assessed/performed Overall Cognitive Status: Impaired/Different from baseline Area of Impairment: Safety/judgement;Problem solving         Safety/Judgement: Decreased awareness of deficits;Decreased awareness of safety   Problem Solving: Slow processing;Requires verbal cues General Comments: Requires repetition of cues.    Exercises      General Comments        Pertinent Vitals/Pain Pain Assessment: No/denies pain    Home Living                      Prior Function            PT Goals (current goals can now be found in the care plan section) Progress towards PT goals: Progressing toward goals    Frequency  Min 4X/week    PT Plan Current plan remains appropriate    Co-evaluation             End of Session Equipment Utilized During Treatment: Gait belt Activity Tolerance: Patient tolerated treatment well Patient left: in bed;with call bell/phone within reach;with family/visitor present     Time: 4967-5916 PT Time Calculation (min) (ACUTE ONLY): 28 min  Charges:  $Gait Training: 23-37 mins                    G Codes:      Despina Pole 07/11/2014, 12:34 PM Carita Pian. Sanjuana Kava, [place] Pager [phone]

## 2014-07-07 NOTE — Progress Notes (Addendum)
STROKE TEAM PROGRESS NOTE   HISTORY DELAILA Williamson is an 68 year old woman with history of mitral valve disease and mitral valve repair on 06/12/2014 at [facility], as well as paroxysmal atrial fibrillation not on anticoagulation, who presented on 12/8 with new onset aphasia and right hemiplegia. There is no previous history of stroke nor TIA. She has been on aspirin daily. Exact time of onset is unclear, last seen well at 10 PM on 12/7. Her family heard her fall in the bathroom at 7 AM on the morning of her presentation. She was unable to speak and unable to move her right side. CT scan of her head showed decreased gray-white differentiation along the left insular cortex. CT perfusion study showed a large area of left MCA territory ischemia reduced mean transit time and reduce cerebral flow. Blood volume however was mostly preserved. She was taken to interventional radiology for further evaluation. Cerebral angiogram showed left MCA M1 occlusion. NIH stroke score was 25. She underwent revascularization at 10:06 AM on 12/8 of the occluded artery with intra-arterial Integrelin.   Patient was not administered tPA secondary to open heart surgery with mitral valve replacement on 06/16/14, in addition she was outside the window for tPA. Marland Kitchen She was admitted to the neuro ICU for further evaluation and treatment.   SUBJECTIVE (INTERVAL HISTORY) Several family members present. The patient is doing well in therapy. The plan was for possible discharge home today; however, in talking with the family the patient lives alone. There will be family members to check on her intermittently. This will delay the discharge plans. Await further recommendations from the therapists.  OBJECTIVE Temp:  [97.6 F (36.4 C)-99.7 F (37.6 C)] 97.6 F (36.4 C) (12/12 0945) Pulse Rate:  [76-117] 76 (12/12 0945) Cardiac Rhythm:  [-] Normal sinus rhythm (12/11 2000) Resp:  [16-20] 20 (12/12 0945) BP: (102-124)/(53-74) 119/70 mmHg (12/12  0945) SpO2:  [95 %-98 %] 95 % (12/12 0945)   Recent Labs Lab 07/06/14 0405 07/06/14 0839 07/06/14 1114 07/06/14 1702 07/06/14 2200  GLUCAP 109* 102* 103* 104* 100*    Recent Labs Lab 07/03/14 1715 07/03/14 1820 07/04/14 0442 07/04/14 2043 07/05/14 0212 07/05/14 1958 07/06/14 0500  NA 143 146 145 143 144  --  141  K 3.5* 2.9* 5.0 4.0 3.5*  --  3.5*  CL 108  --  113* 108 111  --  106  CO2 22  --  21 21 22   --  22  GLUCOSE 98  --  87 108* 99  --  104*  BUN 12  --  12 9 9   --  6  CREATININE 0.58  --  0.62 0.50 0.45*  --  0.44*  CALCIUM 8.9  --  8.6 8.9 8.6  --  8.7  MG 2.0  --   --   --   --  1.8 1.7  PHOS 3.2  --   --   --   --   --   --     Recent Labs Lab 07/03/14 0800  AST 20  ALT 14  ALKPHOS 120*  BILITOT 1.1  PROT 6.6  ALBUMIN 3.7    Recent Labs Lab 07/03/14 0800 07/03/14 0808 07/03/14 1820 07/04/14 0442  WBC 5.9  --   --  6.6  NEUTROABS 3.5  --   --  4.7  HGB 11.9* 13.3 9.9* 10.6*  HCT 37.1 39.0 29.0* 34.0*  MCV 83.4  --   --  85.4  PLT 201  --   --  [place] Lab 07/03/14 1706 07/03/14 2222 07/04/14 0442  TROPONINI <0.30 <0.30 <0.30    Recent Labs  07/05/14 0212  LABPROT 15.2  INR 1.19        Component Value Date/Time   CHOL 132 07/04/2014 0442   TRIG 153* 07/04/2014 0442   HDL 46 07/04/2014 0442   CHOLHDL 2.9 07/04/2014 0442   VLDL 31 07/04/2014 0442   LDLCALC 55 07/04/2014 0442   Lab Results  Component Value Date   HGBA1C 5.6 07/04/2014     Recent Labs Lab 07/03/14 0800  ETH <11    PHYSICAL EXAM General: Sitting up in bed, in NAD. Hoarseness. Afebrile. Head is nontraumatic. Neck is supple without bruit. Lungs are clear to auscultation bilaterally.Cardiac exam no murmur or gallop. Lungs are clear to auscultation. Distal pulses are well felt. Neurologic Examination: Mental Status:  Alert, following commands. Mild dysarthria.  Cranial Nerves: III/IV/VI-Pupils were equal and reacted to light. Slight  conjugate gaze deviation to the left side but eyes move beyond midline to right. Intact EOM.  V/VII-mild right lower facial weakness. X, XII- gag reflex present Motor: Mild weakness of right arm, more pronounced in right hand with normal muscle tone; normal strength and tone of right LE; normal tone and strength of left extremities. Diminished fine motor skills in the right hand. Mild right grip weakness. Sensory: Normal sensation on light touch in UE and LE bilaterally Plantars: Flexor bilaterally Cerebellar: Normal finger finger-to-nose with left hand, slow with the right.   ASSESSMENT/PLAN [name] is a 68 y.o. woman with history of A. 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  She did not receive IV t-PA due to recent surgery and being outside the tPA window but underwent successful revascularization with intra-arterial intergrelin and mechanical embolectomy with Trevo provue device.   Stroke: Non-dominant left infarct embolic/occulsion secondary to MCA M1 occlusion.  Resultant  Right hemiplegia  MRI  Acute left MCA infarct involving the body of the caudate, putamen, insula, and lateral temporal lobe. Small amount of hemorrhage of the left putamen.   MRA  Successful recanalization of the left M1 segment which is widely patent. No branch occlusion on the left. Mild intracranial atherosclerotic disease.  Carotid Doppler - Consistent with 1- 39 percent stenosis. The vertebral arteries appear patent with antegrade flow. 2D Echo: no source of emboli  CT head wo contrast: Post retrieval of left M1 segment thrombus. Left middle cerebral artery distribution infarct  CT cerebral perfusion: prior to revascularization, large area of Left MCA territory ischemia with decreased mean transit time and decreased cerebral blood blood. Cerebral blood flow is mostly preserved, suggesting this may be reversible.   LDL  55  HgbA1c 5.6%  SCDs for VTE prophylaxis  Diet Heart healthy, thin liquids  aspirin 81 mg orally every day prior to admission, Eliquis 5 mg BID started today (07/07/2014) per pharmacy dosing.  Ongoing aggressive stroke risk factor management   Disposition:  Outpatient therapies planned.  Hypertension  Home meds:   Metoprolol 25mg  XL  Stable  Hyperlipidemia, no hx of  Home meds:  None  LDL 55, goal < 70  Diabetes -No hx of diabetes  HgbA1c 5.6% goal < 7.0  Other Stroke Risk Factors  Advanced age  A. fib  Other Active Problems  Recent open heart surgery for MV prolapse repair, not on anticoagulation   Paroxysmal A. Fib  not on anticoagulation   Patient will be alone during the day with family members checking in from time to time. Awaiting further recommendations from the therapists.     Mikey Bussing PA-C [facility] Pager [phone] 07/07/2014, 11:37 AM   Hospital day # 4 I have personally examined this patient, reviewed notes, independently viewed imaging studies, participated in medical decision making and plan of care. I have made any additions or clarifications directly to the above note. Agree with note above. Likely DC home with HHPTOT if too good for rehab in am. D/W daughter and patient and answered questions. Antony Contras, MD    To contact Stroke Continuity provider, please refer to [email or web address] After hours, contact General Neurology

## 2014-07-08 NOTE — Progress Notes (Signed)
STROKE TEAM PROGRESS NOTE   HISTORY Colleen Williamson is an 68 year old woman with history of mitral valve disease and mitral valve repair on 06/12/2014 at [facility], as well as paroxysmal atrial fibrillation not on anticoagulation, who presented on 12/8 with new onset aphasia and right hemiplegia. There is no previous history of stroke nor TIA. She has been on aspirin daily. Exact time of onset is unclear, last seen well at 10 PM on 12/7. Her family heard her fall in the bathroom at 7 AM on the morning of her presentation. She was unable to speak and unable to move her right side. CT scan of her head showed decreased gray-white differentiation along the left insular cortex. CT perfusion study showed a large area of left MCA territory ischemia reduced mean transit time and reduce cerebral flow. Blood volume however was mostly preserved. She was taken to interventional radiology for further evaluation. Cerebral angiogram showed left MCA M1 occlusion. NIH stroke score was 25. She underwent revascularization at 10:06 AM on 12/8 of the occluded artery with intra-arterial Integrelin.   Patient was not administered tPA secondary to open heart surgery with mitral valve replacement on 06/16/14, in addition she was outside the window for tPA. Marland Kitchen She was admitted to the neuro ICU for further evaluation and treatment.   SUBJECTIVE (INTERVAL HISTORY) Several family members present. The patient lives alone and the therapists are recommending 24-hour per day supervision. Family members will be able to check on the patient from time to time although constant supervision is not available. The patient may be a candidate for continued inpatient rehabilitation at [facility] or possibly at a nursing facility. The family and the patient agree with this plan. Dr. Naaman Plummer saw the patient on 07/06/2014 and stated that they will continue to follow the patient.  OBJECTIVE Temp:  [97.5 F (36.4 C)-99.5 F (37.5 C)] 98.9 F (37.2  C) (12/13 0700) Pulse Rate:  [60-94] 82 (12/13 0700) Cardiac Rhythm:  [-] Normal sinus rhythm (12/12 1900) Resp:  [18-20] 18 (12/13 0700) BP: (107-127)/(57-76) 112/73 mmHg (12/13 0700) SpO2:  [94 %-100 %] 97 % (12/13 0700)   Recent Labs Lab 07/06/14 0405 07/06/14 0839 07/06/14 1114 07/06/14 1702 07/06/14 2200  GLUCAP 109* 102* 103* 104* 100*    Recent Labs Lab 07/03/14 1715 07/03/14 1820 07/04/14 0442 07/04/14 2043 07/05/14 0212 07/05/14 1958 07/06/14 0500  NA 143 146 145 143 144  --  141  K 3.5* 2.9* 5.0 4.0 3.5*  --  3.5*  CL 108  --  113* 108 111  --  106  CO2 22  --  21 21 22   --  22  GLUCOSE 98  --  87 108* 99  --  104*  BUN 12  --  12 9 9   --  6  CREATININE 0.58  --  0.62 0.50 0.45*  --  0.44*  CALCIUM 8.9  --  8.6 8.9 8.6  --  8.7  MG 2.0  --   --   --   --  1.8 1.7  PHOS 3.2  --   --   --   --   --   --     Recent Labs Lab 07/03/14 0800  AST 20  ALT 14  ALKPHOS 120*  BILITOT 1.1  PROT 6.6  ALBUMIN 3.7    Recent Labs Lab 07/03/14 0800 07/03/14 0808 07/03/14 1820 07/04/14 0442  WBC 5.9  --   --  6.6  NEUTROABS 3.5  --   --  4.7  HGB 11.9* 13.3 9.9* 10.6*  HCT 37.1 39.0 29.0* 34.0*  MCV 83.4  --   --  85.4  PLT 201  --   --  196    Recent Labs Lab 07/03/14 1706 07/03/14 2222 07/04/14 0442  TROPONINI <0.30 <0.30 <0.30   No results for input(s): LABPROT, INR in the last 72 hours.      Component Value Date/Time   CHOL 132 07/04/2014 0442   TRIG 153* 07/04/2014 0442   HDL 46 07/04/2014 0442   CHOLHDL 2.9 07/04/2014 0442   VLDL 31 07/04/2014 0442   LDLCALC 55 07/04/2014 0442   Lab Results  Component Value Date   HGBA1C 5.6 07/04/2014     Recent Labs Lab 07/03/14 0800  ETH <11   07/03/2014 ENDOVASCULAR COMPLETE REVASCULARIZATION OF OCCLUDED LEFT MIDDLE CEREBRAL ARTERY USING MECHANICAL THROMBECTOMY, AND SUPER SELECTIVE INTRACRANIAL INTRA-ARTERIAL 3 MG OF INTEGRELIN: - Dr Estanislado Pandy  MRI/MRA head without  contrast 07/04/2014 Acute left MCA infarct involving the body the caudate comminuted putamen, insula, and lateral temporal lobe as above. Small amount of hemorrhage in the left putamen.  MRA reveals successful re- cannulization of left M1 segment which is widely patent. No branch occlusion identified on the left. Mild intracranial atherosclerotic disease is present.    PHYSICAL EXAM General: Sitting up in bed, in NAD. Hoarseness. Afebrile. Head is nontraumatic. Neck is supple without bruit. Lungs are clear to auscultation bilaterally.Cardiac exam no murmur or gallop. Lungs are clear to auscultation. Distal pulses are well felt. Neurologic Examination: Mental Status:  Alert, following commands. Mild dysarthria.  Cranial Nerves: III/IV/VI-Pupils were equal and reacted to light. Slight conjugate gaze deviation to the left side but eyes move beyond midline to right. Intact EOM.  V/VII-mild right lower facial weakness. X, XII- gag reflex present Motor: Mild weakness of right arm, more pronounced in right hand with normal muscle tone; normal strength and tone of right LE; normal tone and strength of left extremities. Diminished fine motor skills in the right hand. Mild right grip weakness. Sensory: Normal sensation on light touch in UE and LE bilaterally Plantars: Flexor bilaterally Cerebellar: Normal finger finger-to-nose with left hand, slow with the right.   ASSESSMENT/PLAN Colleen Williamson is a 68 y.o. woman with history of A. 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  She did not receive IV t-PA due to recent surgery and being outside the tPA window but underwent successful revascularization with intra-arterial intergrelin and mechanical embolectomy with Trevo provue device.   Stroke: Non-dominant left infarct embolic/occulsion secondary to MCA M1 occlusion.  Resultant  Right hemiplegia  MRI  Acute left  MCA infarct involving the body of the caudate, putamen, insula, and lateral temporal lobe. Small amount of hemorrhage of the left putamen.   MRA  Successful recanalization of the left M1 segment which is widely patent. No branch occlusion on the left. Mild intracranial atherosclerotic disease.  Carotid Doppler - Consistent with 1- 39 percent stenosis. The vertebral arteries appear patent with antegrade flow. 2D Echo: no source of emboli  CT head wo contrast: Post retrieval of left M1 segment thrombus. Left middle cerebral artery distribution infarct  CT cerebral perfusion: prior to revascularization, large area of Left MCA territory ischemia with decreased mean transit time and decreased cerebral blood blood. Cerebral blood flow is mostly preserved, suggesting this may be reversible.   LDL 55  HgbA1c 5.6%  SCDs  for VTE prophylaxis  Diet Heart healthy, thin liquids  aspirin 81 mg orally every day prior to admission, Eliquis 5 mg BID started today (07/07/2014) per pharmacy dosing.  Ongoing aggressive stroke risk factor management   Disposition: We will need reevaluation for possible CIR admission as it is not felt that the patient is safe to be at home alone at this time.  Hypertension  Home meds:   Metoprolol 25mg  XL - currently on hold. Diltiazem 90 mg 4 times a day - currently on hold.  Stable  Permissive hypertension <220/120 for 24-48 hours and then gradually normalize within 5-7 days  Consider restarting metoprolol soon although blood pressure and heart rate appear to be controlled.  Frequent PVCs on telemetry and short runs of nonsustained V. tach  Hyperlipidemia, no hx of  Home meds:  None  LDL 55, goal < 70  Diabetes -No hx of diabetes  HgbA1c 5.6% goal < 7.0  Other Stroke Risk Factors  Advanced age  A. fib  Other Active Problems  Recent open heart surgery for MV prolapse repair, not on anticoagulation   Paroxysmal A. Fib not on anticoagulation  Home  medications- Celexa 10 mg daily and Lasix 20 mg daily both currently on hold.   Frequent PVCs    Patient will be alone during the day with family members checking in from time to time. Awaiting further recommendations from CIR.     Mikey Bussing PA-C [facility] Pager [phone] 07/08/2014, 9:29 AM   Hospital day # 5 I have personally examined this patient, reviewed notes, independently viewed imaging studies, participated in medical decision making and plan of care. I have made any additions or clarifications directly to the above note. Agree with note above. Likely DC home with HHPTOT if too good for rehab in am. D/W daughter and patient and answered questions. Antony Contras, MD    To contact Stroke Continuity provider, please refer to [email or web address] After hours, contact General Neurology

## 2014-07-08 NOTE — Progress Notes (Signed)
Physical Therapy Treatment Patient Details Name: Colleen Williamson MRN: [id number] DOB: 1946/03/13 Today's Date: 07/08/2014    History of Present Illness Colleen Williamson is an 68 y.o. female history of mitral valve disease and mitral valve replacement on 06/16/2014, as well as intermittent atrial fibrillation not on anticoagulation, presenting with new onset aphasia and right hemiplegia. MRI: Acute infarct left MCA territory. Infarct involves the body of the caudate but not the head of the caudate. Most of the putamen is involved with a small amount of hemorrhage in the putamen. There is a mild amount of infarct in the insular cortex and a small amount of infarct in the left lateral temporal lobe.    PT Comments    Patient continues to have strength and balance deficits impacting mobility, gait, and safety.  Completed DGI balance assessment with patient scoring 13/24 - high fall risk (fall risk at 19/24).  Patient currently requiring up to mod assist for gait/stairs.  Patient lives alone and must function at Mod I level to return home safely.  Recommend Inpatient Rehab consult to return patient to highest functional level to facilitate return home.   Follow Up Recommendations  CIR;Supervision/Assistance - 24 hour     Equipment Recommendations  Rolling walker with 5" wheels    Recommendations for Other Services Rehab consult     Precautions / Restrictions Precautions Precautions: Fall Restrictions Weight Bearing Restrictions: No    Mobility  Bed Mobility Overal bed mobility: Needs Assistance Bed Mobility: Supine to Sit;Sit to Supine     Supine to sit: Min assist Sit to supine: Min assist   General bed mobility comments: Verbal cues for technique with HOB lowered.  Patient required assist to raise trunk to move to sitting, and to bring LE's onto bed to return to supine.  Transfers Overall transfer level: Needs assistance Equipment used: None Transfers: Sit to/from Stand Sit to  Stand: Min assist         General transfer comment: Verbal cues for sternal precautions.  Assist for balance/safety.  Ambulation/Gait Ambulation/Gait assistance: Min assist;Mod assist Ambulation Distance (Feet): 180 Feet Assistive device: None Gait Pattern/deviations: Step-through pattern;Decreased step length - right;Decreased step length - left;Decreased stride length;Shuffle;Drifts right/left Gait velocity: Decreased Gait velocity interpretation: Below normal speed for age/gender General Gait Details: Patient working on balance during gait without AD.  Patient with very slow, guarded gait.  Drifts to right side.  Performed DGI balance assessment with score of 13/24 (fall risk).  Patient unable to step over obstacle, stepping around it.  When going around 2 obstacles, patient ran into second obstacle on right side.  Patient unable to walk at a normal gait speed, with only minimal ability to change speeds.  Patient requires assist on stairs, using step-to pattern and must use rail to descend stairs.   Stairs Stairs: Yes Stairs assistance: Mod assist Stair Management: No rails;One rail Right;Step to pattern;Forwards Number of Stairs: 3 General stair comments: Completed as part of balance assessment.  Patient using step-to pattern, and mod assist to prevent fall.  Wheelchair Mobility    Modified Rankin (Stroke Patients Only) Modified Rankin (Stroke Patients Only) Pre-Morbid Rankin Score: No symptoms Modified Rankin: Moderately severe disability     Balance                                    Cognition Arousal/Alertness: Awake/alert Behavior During Therapy: WFL for tasks assessed/performed Overall Cognitive  Status: Impaired/Different from baseline Area of Impairment: Safety/judgement;Problem solving         Safety/Judgement: Decreased awareness of deficits;Decreased awareness of safety   Problem Solving: Slow processing;Requires verbal cues       Exercises      General Comments        Pertinent Vitals/Pain Pain Assessment: No/denies pain    Home Living                      Prior Function            PT Goals (current goals can now be found in the care plan section) Progress towards PT goals: Progressing toward goals    Frequency  Min 4X/week    PT Plan Discharge plan needs to be updated    Co-evaluation             End of Session Equipment Utilized During Treatment: Gait belt Activity Tolerance: Patient tolerated treatment well Patient left: in bed;with call bell/phone within reach;with bed alarm set;with family/visitor present     Time: 4818-5631 PT Time Calculation (min) (ACUTE ONLY): 27 min  Charges:  $Gait Training: 23-37 mins                    G Codes:      Despina Pole 07/19/14, 10:22 AM Carita Pian. Sanjuana Kava, [place] Pager [phone]

## 2014-07-08 NOTE — Progress Notes (Signed)
Occupational Therapy Treatment Patient Details Name: Colleen Williamson MRN: [id number] DOB: 02-10-1946 Today's Date: 07/08/2014    History of present illness Colleen Williamson is an 68 y.o. female history of mitral valve disease and mitral valve replacement on 06/16/2014, as well as intermittent atrial fibrillation not on anticoagulation, presenting with new onset aphasia and right hemiplegia. MRI: Acute infarct left MCA territory. Infarct involves the body of the caudate but not the head of the caudate. Most of the putamen is involved with a small amount of hemorrhage in the putamen. There is a mild amount of infarct in the insular cortex and a small amount of infarct in the left lateral temporal lobe.   OT comments  Pt seen today to address attention and strengthening of RUE, safety with ADLs and IADLs, and functional mobility. Pt's daughter reports that pt will not have 24/7 assistance and OT session focused on simulating pt safety if home alone. Pt requires min -Mod (A) for balance during functional mobility due to R foot drop, decreased R side attention, and impaired safety/judgment. Pt requires repeated cueing for safety. Provided pt with theraputty and coordination activities for RUE with written HEP. Pt would be an excellent CIR candidate to return to Mod I level to enable her to return home safely.     Follow Up Recommendations  CIR;Supervision/Assistance - 24 hour    Equipment Recommendations  Other (comment) (TBD)    Recommendations for Other Services      Precautions / Restrictions Precautions Precautions: Fall Restrictions Weight Bearing Restrictions: No Other Position/Activity Restrictions: sternal precautions       Mobility Bed Mobility Overal bed mobility: Needs Assistance Bed Mobility: Supine to Sit;Sit to Supine     Supine to sit: Min assist Sit to supine: Min guard   General bed mobility comments: HOB flat. No bed rail. Pt required hand held assist to elevate trunk off  bed. VC's for sequencing  Transfers Overall transfer level: Needs assistance Equipment used: None Transfers: Sit to/from Stand Sit to Stand: Min assist         General transfer comment: Verbal cues for sternal precautions.  Assist for balance/safety.        ADL Overall ADL's : Needs assistance/impaired                     Lower Body Dressing: Min guard;Sit to/from stand Lower Body Dressing Details (indicate cue type and reason): Pt able to sit EOB and don underwear. Mild safety concern as pt had difficulty crossing LLE over R knee and instead leaned quickly forward to thread foot through leg hole.  Toilet Transfer: Minimal assistance;Ambulation Toilet Transfer Details (indicate cue type and reason): pt requires min (A) for ambulation. Pt stumbled on threshold into bathroom but was able to open door with her [place] and Hygiene: Min guard;Sit to/from stand Toileting - Clothing Manipulation Details (indicate cue type and reason): close guard for safety due to balance deficit     Functional mobility during ADLs: Minimal assistance;Moderate assistance General ADL Comments: Pt ambulated into hallway to address safety and balance to simulate home environment. Pt lives at home and does not have 24/7 Supervision for safety. Pt drags R foot which impairs her safety with functional mobility at home. Despite multiple verbal cues to take big steps and lift RLE, pt consistently returned to dragging Right foot. Pt also looking down when walking and bumping into walls/objects on the Right (when not cued to walk around).  Cognition  Arousal/Alertness: Awake/Alert Behavior During Therapy: WFL for tasks assessed/performed Overall Cognitive Status: Impaired/Different from baseline Area of Impairment: Safety/judgement;Problem solving          Safety/Judgement: Decreased awareness of deficits;Decreased awareness of safety   Problem  Solving: Slow processing;Requires verbal cues General Comments: Pt requires repetition of cues.       Exercises Other Exercises Other Exercises: provided pt with theraputty (tan) and hand exercises/activities to increase strength and coordination. Encouraged pt to perform 10 minutes several times daily.  Other Exercises: Pt performed PNF pattern reaching with RUE to facilitate coordination and increased use of RUE in addition to trunk facilitation through dynamic reaching.            Pertinent Vitals/ Pain       Pain Assessment: No/denies pain         Frequency Min 3X/week     Progress Toward Goals  OT Goals(current goals can now be found in the care plan section)  Progress towards OT goals: Progressing toward goals  ADL Goals Pt Will Perform Grooming: with modified independence;standing Pt Will Perform Lower Body Bathing: with modified independence;sit to/from stand (without VC's) Pt Will Perform Lower Body Dressing: with modified independence;sit to/from stand Pt Will Transfer to Toilet: with modified independence;ambulating Pt Will Perform Toileting - Clothing Manipulation and hygiene: with modified independence;sit to/from stand Pt/caregiver will Perform Home Exercise Program: Increased strength;Right Upper extremity;With theraputty;With written HEP provided;With Supervision  Plan Discharge plan remains appropriate       End of Session Equipment Utilized During Treatment: Gait belt;Other (comment) (theraputty)   Activity Tolerance Patient tolerated treatment well   Patient Left in bed;with call bell/phone within reach;with family/visitor present   Nurse Communication Other (comment) (pt with small spot on Right buttock. Notified RN)        Time: 5427-0623 OT Time Calculation (min): 42 min  Charges: OT General Charges $OT Visit: 1 Procedure OT Treatments $Self Care/Home Management : 8-22 mins $Therapeutic Activity: 8-22 mins $Therapeutic Exercise: 8-22  mins  Juluis Rainier 07/08/2014, 3:09 PM   Cyndie Chime, OTR/L Occupational Therapist [phone] (pager)

## 2014-07-09 ENCOUNTER — Telehealth: Payer: Self-pay | Admitting: Neurology

## 2014-07-09 MED ORDER — APIXABAN 5 MG PO TABS: 5.0000 mg | ORAL_TABLET | Freq: Two times a day (BID) | ORAL | Status: DC

## 2014-07-09 NOTE — Progress Notes (Signed)
NOTE FROM 07/06/2014  Unable to do benefit check for Eliquis due to patient is changing her insurance from [facility] to [facility] and has not received her new insurance card in the mail. Coupon for Eliquis given to daughter; Patient wants Outpatient physical therapy at discharge and request [facility]; orders and clinical information faxed; [facility] will contact the patient with a start up date and time for therapy; Aneta Mins [phone]

## 2014-07-09 NOTE — Progress Notes (Signed)
Physical Therapy Treatment Patient Details Name: Colleen Williamson MRN: [id number] DOB: April 16, 1946 Today's Date: 07/09/2014    History of Present Illness Colleen Williamson is an 68 y.o. female history of mitral valve disease and mitral valve replacement on 06/16/2014, as well as intermittent atrial fibrillation not on anticoagulation, presenting with new onset aphasia and right hemiplegia. MRI: Acute infarct left MCA territory. Infarct involves the body of the caudate but not the head of the caudate. Most of the putamen is involved with a small amount of hemorrhage in the putamen. There is a mild amount of infarct in the insular cortex and a small amount of infarct in the left lateral temporal lobe.    PT Comments    Patient is making much better progress today with therapy and is now (at request of daughter and patient both) requesting to follow up with OPPT instead of CIR. Patient now has family set up for 24/7 assist and has transportation to and from rehab. Patient was educated on signs/symptoms of CVA and was able to "teach back". Will continue with acute PT and progressing towards goals  Follow Up Recommendations  Outpatient PT;Supervision/Assistance - 24 hour     Equipment Recommendations  Rolling walker with 5" wheels    Recommendations for Other Services       Precautions / Restrictions Precautions Precautions: Fall;Sternal Restrictions Weight Bearing Restrictions: No Other Position/Activity Restrictions: sternal precautions    Mobility  Bed Mobility Overal bed mobility: Modified Independent Bed Mobility: Supine to Sit     Supine to sit: Modified independent (Device/Increase time)     General bed mobility comments: HOB flat,no use of rails.    Transfers Overall transfer level: Needs assistance Equipment used: None Transfers: Sit to/from [facility] Sit to Stand: Min guard Stand pivot transfers: Supervision;Min guard       General transfer comment: Safe  use of UE and precautions  Ambulation/Gait Ambulation/Gait assistance: Min guard Ambulation Distance (Feet): 500 Feet Assistive device: None Gait Pattern/deviations: Step-through pattern;Decreased stride length;Staggering right Gait velocity: Decreased   General Gait Details: Patient continues to be slow and guarded with gait with some right drift but no major LOB that she was not able to self correct. Incorporated head turns, obstacles and directional changes with gait with no LOB   Stairs   Stairs assistance: Min assist Stair Management: Step to pattern;Alternating pattern Number of Stairs: 10 General stair comments: Min A for HHA (to act as rail).   Wheelchair Mobility    Modified Rankin (Stroke Patients Only) Modified Rankin (Stroke Patients Only) Pre-Morbid Rankin Score: No symptoms Modified Rankin: Moderately severe disability     Balance     Sitting balance-Leahy Scale: Good       Standing balance-Leahy Scale: Fair                      Cognition Arousal/Alertness: Awake/alert Behavior During Therapy: WFL for tasks assessed/performed Overall Cognitive Status: Impaired/Different from baseline               Problem Solving: Slow processing;Requires verbal cues      Exercises Other Exercises Other Exercises: provided pt with theraputty (tan) and hand exercises/activities to increase strength and coordination. Encouraged pt to perform 10 minutes several times daily.  Other Exercises: Pt performed PNF pattern reaching with RUE to facilitate coordination and increased use of RUE in addition to trunk facilitation through dynamic reaching.     General Comments  Pertinent Vitals/Pain Pain Assessment: No/denies pain    Home Living                      Prior Function            PT Goals (current goals can now be found in the care plan section) Progress towards PT goals: Progressing toward goals    Frequency  Min 4X/week     PT Plan Discharge plan needs to be updated    Co-evaluation             End of Session Equipment Utilized During Treatment: Gait belt Activity Tolerance: Patient tolerated treatment well Patient left: in chair;with call bell/phone within reach;with chair alarm set     Time: [phone] PT Time Calculation (min) (ACUTE ONLY): 28 min  Charges:  $Gait Training: 8-22 mins $Therapeutic Activity: 8-22 mins                    G Codes:      Colleen Williamson 07/09/2014, 10:47 AM

## 2014-07-09 NOTE — Discharge Summary (Signed)
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   Paroxsymal Atrial Fibrillation  Hypertension   Past Medical History  Diagnosis Date  . Abnormal mammogram, unspecified 2014  . Breast cyst   . Stroke 07/03/2014   Past Surgical History  Procedure Laterality Date  . Abdominal hysterectomy  2004  . Eye surgery  2007  . Mitral valve repair  05/2014  . Radiology with anesthesia N/A 07/03/2014    Procedure: RADIOLOGY WITH ANESTHESIA;  Surgeon: Rob Hickman, MD;  Location: [facility];  Service: Radiology;  Laterality: N/A;      Medication List    STOP taking these medications        aspirin EC 81 MG tablet      TAKE these medications        apixaban 5 MG Tabs tablet  Commonly known as:  ELIQUIS  Take 1 tablet (5 mg total) by mouth 2 (two) times daily.     citalopram 10 MG tablet  Commonly known as:  CELEXA  Take 10 mg by mouth daily.     diltiazem 90 MG tablet  Commonly known as:  CARDIZEM  Take 90 mg by mouth 4 (four) times daily.     furosemide 20 MG tablet  Commonly known as:  LASIX  Take 20 mg by mouth daily.     meclizine 25 MG tablet  Commonly known as:  ANTIVERT  Take 1 mg by mouth daily.     metoprolol succinate 25 MG 24 hr tablet  Commonly known as:  TOPROL-XL  Take 25 mg by mouth daily.     oxyCODONE-acetaminophen 5-325 MG per tablet  Commonly known as:  PERCOCET/ROXICET  Take 0.5-1 tablets by mouth every 4 (four)  hours as needed for severe pain.        LABORATORY STUDIES CBC    Component Value Date/Time   WBC 6.6 07/04/2014 0442   RBC 3.98 07/04/2014 0442   HGB 10.6* 07/04/2014 0442   HCT 34.0* 07/04/2014 0442   PLT 196 07/04/2014 0442   MCV 85.4 07/04/2014 0442   MCH 26.6 07/04/2014 0442   MCHC 31.2 07/04/2014 0442   RDW 14.5 07/04/2014 0442   LYMPHSABS 1.1 07/04/2014 0442   MONOABS 0.4 07/04/2014 0442   EOSABS 0.3 07/04/2014 0442   BASOSABS 0.1 07/04/2014 0442   CMP    Component Value Date/Time   NA 141 07/06/2014 0500   K 3.5* 07/06/2014 0500   CL 106 07/06/2014 0500   CO2 22 07/06/2014 0500   GLUCOSE 104* 07/06/2014 0500   BUN 6 07/06/2014 0500   CREATININE 0.44* 07/06/2014 0500   CALCIUM 8.7 07/06/2014 0500   PROT 6.6 07/03/2014 0800   ALBUMIN 3.7 07/03/2014 0800   AST 20 07/03/2014 0800   ALT 14 07/03/2014 0800   ALKPHOS 120* 07/03/2014 0800   BILITOT 1.1 07/03/2014 0800   GFRNONAA >90 07/06/2014 0500   GFRAA >90 07/06/2014 0500   COAGS Lab Results  Component Value Date  INR 1.19 07/05/2014   INR 1.07 07/03/2014   Lipid Panel    Component Value Date/Time   CHOL 132 07/04/2014 0442   TRIG 153* 07/04/2014 0442   HDL 46 07/04/2014 0442   CHOLHDL 2.9 07/04/2014 0442   VLDL 31 07/04/2014 0442   LDLCALC 55 07/04/2014 0442   HgbA1C  Lab Results  Component Value Date   HGBA1C 5.6 07/04/2014   Cardiac Panel (last 3 results) No results for input(s): CKTOTAL, CKMB, TROPONINI, RELINDX in the last 72 hours. Urinalysis    Component Value Date/Time   COLORURINE YELLOW 07/03/2014 0903   APPEARANCEUR CLOUDY* 07/03/2014 0903   LABSPEC 1.039* 07/03/2014 0903   PHURINE 8.0 07/03/2014 0903   GLUCOSEU NEGATIVE 07/03/2014 0903   HGBUR NEGATIVE 07/03/2014 0903   BILIRUBINUR NEGATIVE 07/03/2014 0903   KETONESUR NEGATIVE 07/03/2014 0903   PROTEINUR 100* 07/03/2014 0903   UROBILINOGEN 0.2 07/03/2014 0903   NITRITE NEGATIVE 07/03/2014 0903   LEUKOCYTESUR NEGATIVE  07/03/2014 0903   Urine Drug Screen     Component Value Date/Time   LABOPIA NONE DETECTED 07/03/2014 0903   COCAINSCRNUR NONE DETECTED 07/03/2014 0903   LABBENZ NONE DETECTED 07/03/2014 0903   AMPHETMU NONE DETECTED 07/03/2014 0903   THCU NONE DETECTED 07/03/2014 0903   LABBARB NONE DETECTED 07/03/2014 0903    Alcohol Level    Component Value Date/Time   ETH <11 07/03/2014 0800     SIGNIFICANT DIAGNOSTIC STUDIES  Ct Head Wo Contrast 07/03/2014    Post retrieval of left M1 segment thrombus. At this level, subtle hyperdensity (series 201, image 13) may represent enhancing parenchyma rather than peri vascular hemorrhage however, attention to this on follow up.  Left middle cerebral artery distribution infarct. This is more apparent than on the prior CT. Hyperdensity of the left lenticular nucleus and caudate may reflect enhancement from recent contrast injection (although hemorrhagic transformation of acute infarct cannot be entirely excluded). This can be evaluated on follow up.    07/03/2014   1. Acute nonhemorrhagic infarct involving the left MCA territory is evidenced by decreased gray-white differentiation along the left insular cortex. 2. No acute hemorrhage.     Ct Cerebral Perfusion W/cm 07/03/2014    1. Large area of left MCA territory ischemia with and decreased mean transit time and decreased cerebral blood flow. Cerebral blood volume is mostly preserved, suggesting this may be reversible.   Electronically Signed   By: Lawrence Santiago M.D.   On: 07/03/2014 09:41   Cerebral Angiogram 07/04/2014   Status post complete angiographic TICI 3, revascularization of occluded left M1 segment proximally using a single pass with a TREVOPROVUE 4 mm x 30 mm stent retrieval device and 3 mg of superselective intracranial intra-arterial Integrelin.     Mr Brain Wo Contrast 07/04/2014    Acute left MCA infarct involving the body the caudate comminuted putamen, insula, and lateral temporal lobe as  above. Small amount of hemorrhage in the left putamen.    [name] Nam Head/brain Wo Cm 07/04/2014     MRA reveals successful re- cannulization of left M1 segment which is widely patent. No branch occlusion identified on the left. Mild intracranial atherosclerotic disease is present.     Dg Chest Port 1 View 07/04/2014    1. Lines and tubes in stable position. 2. Bibasilar pulmonary infiltrates and bilateral pleural effusions, right greater than left. 3. Stable cardiomegaly with normal pulmonary vascularity. Prior CABG. Prior cardiac valve replacement.    07/03/2014   1. Bilateral pleural effusions  and bibasilar atelectasis/consolidation. 2.  Support hardware in expected position.        HISTORY OF PRESENT ILLNESS Colleen Williamson is an 68 year old woman with history of mitral valve disease and mitral valve repair on 06/12/2014 at [facility], as well as paroxysmal atrial fibrillation not on anticoagulation, who presented on 12/8 with new onset aphasia and right hemiplegia. There is no previous history of stroke nor TIA. She has been on aspirin daily. Exact time of onset is unclear, last seen well at 10 PM on 12/7. Her family heard her fall in the bathroom at 7 AM on the morning of her presentation. She was unable to speak and unable to move her right side. CT scan of her head showed decreased gray-white differentiation along the left insular cortex. CT perfusion study showed a large area of left MCA territory ischemia reduced mean transit time and reduce cerebral flow. Blood volume however was mostly preserved. Patient was not administered tPA secondary to open heart surgery with mitral valve replacement on 06/16/14, in addition she was outside the window for tPA. She was taken to interventional radiology for further evaluation. Cerebral angiogram showed left MCA M1 occlusion. NIH stroke score was 25. She underwent revascularization at 10:06 AM on 12/8 of the occluded artery with intra-arterial Integrelin. She was admitted  to the neuro ICU for further evaluation and treatment.    HOSPITAL COURSE Colleen Williamson is a 68 y.o. woman with history of A. 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 She did not receive IV t-PA due to recent surgery and being outside the tPA window but underwent successful revascularization with intra-arterial intergrelin and mechanical embolectomy with Trevo provue device.   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   Resultant Right hemiplegia  CT head wo contrast: Post retrieval of left M1 segment thrombus. Left middle cerebral artery distribution infarct  CT cerebral perfusion: prior to revascularization, large area of Left MCA territory ischemia with decreased mean transit time and decreased cerebral blood blood. Cerebral blood flow is mostly preserved, suggesting this may be reversible.   MRI Acute left MCA infarct involving the body of the caudate, putamen, insula, and lateral temporal lobe. Small amount of hemorrhage of the left putamen.   MRA Successful recanalization of the left M1 segment which is widely patent. No branch occlusion on the left. Mild intracranial atherosclerotic disease.  Carotid Doppler - Consistent with 1- 39 percent stenosis. The vertebral arteries appear patent with antegrade flow.  2D Echo: no source of emboli  LDL 55, at goal < 70  HgbA1c 5.6%. No hx diabetes  Diet Heart healthy, thin liquids  aspirin 81 mg orally every day prior to admission, Eliquis 5 mg BID started today (07/07/2014) per pharmacy dosing.  Ongoing aggressive stroke risk factor management  Disposition: d/c home with [facility] and OT. Has supportive family, but not 24h care. She is agreeable to plan.  Paroxsymal Atrial Fibrillation  Home meds:  Not on anticoatulation  New eliquis 5  mg bid  Hypertension  Home meds: Metoprolol 25mg  XL - currently on hold. Diltiazem 90 mg 4 times a day - currently on hold.  Restarted metoprolol   Frequent PVCs on telemetry and short runs of nonsustained V. tach  Other Stroke Risk Factors  Advanced age  Other Active Problems  Recent open heart surgery  for MV prolapse repair, not on anticoagulation   Home medications- Celexa 10 mg daily and Lasix 20 mg daily. Resumed at discharge   DISCHARGE EXAM Blood pressure 114/69, pulse 81, temperature 99.6 F (37.6 C), temperature source Oral, resp. rate 20, height 5\' 10"  (1.778 m), weight 85.2 kg (187 lb 13.3 oz), SpO2 96 %. General: Sitting up in bed, in NAD. Hoarseness. Afebrile. Head is nontraumatic. Neck is supple without bruit. Lungs are clear to auscultation bilaterally.Cardiac exam no murmur or gallop. Lungs are clear to auscultation. Distal pulses are well felt. Neurologic Examination: Mental Status:  Alert, following commands. Mild dysarthria.  Cranial Nerves: III/IV/VI-Pupils were equal and reacted to light. Slight conjugate gaze deviation to the left side but eyes move beyond midline to right. Intact EOM.  V/VII-mild right lower facial weakness. X, XII- gag reflex present Motor: Mild weakness of right arm, more pronounced in right hand with normal muscle tone; normal strength and tone of right LE; normal tone and strength of left extremities. Diminished fine motor skills in the right hand. Mild right grip weakness. Sensory: Normal sensation on light touch in UE and LE bilaterally Plantars: Flexor bilaterally Cerebellar: Normal finger finger-to-nose with left hand, slow with the right.   DISCHARGE PLAN  Disposition:  Home w/ HH PT and OT   eliquis (apixaban) for secondary stroke prevention.  Follow-up SPARKS,JEFFREY D, MD in 2 weeks.  Follow-up with Dr. Antony Contras, Stroke Clinic in 1 month.  35 minutes were spent preparing discharge.  [place] for Pager information 07/09/2014 5:28 PM  I have personally examined this patient, reviewed notes, independently viewed imaging studies, participated in medical decision making and plan of care. I have made any additions or clarifications directly to the above note. Agree with note above.   Antony Contras, MD Medical Director [facility] Pager: [phone] 07/09/2014 7:59 PM

## 2014-07-09 NOTE — Progress Notes (Deleted)
Physical Therapy Treatment Patient Details Name: Colleen Williamson MRN: [id number] DOB: 1945/08/17 Today's Date: 07/09/2014    History of Present Illness Colleen Williamson is an 68 y.o. female history of mitral valve disease and mitral valve replacement on 06/16/2014, as well as intermittent atrial fibrillation not on anticoagulation, presenting with new onset aphasia and right hemiplegia. MRI: Acute infarct left MCA territory. Infarct involves the body of the caudate but not the head of the caudate. Most of the putamen is involved with a small amount of hemorrhage in the putamen. There is a mild amount of infarct in the insular cortex and a small amount of infarct in the left lateral temporal lobe.    PT Comments    Patient is making much better progress today with therapy and is now (at request of daughter and patient both) requesting to follow up with OPPT instead of CIR. Patient now has family set up for 24/7 assist and has transportation to and from rehab. Patient was educated on signs/symptoms of CVA and was able to "teach back". Will continue with acute PT and progressing towards goals.   Follow Up Recommendations  Home health PT;Supervision/Assistance - 24 hour     Equipment Recommendations  Rolling walker with 5" wheels    Recommendations for Other Services       Precautions / Restrictions Precautions Precautions: Fall;Sternal Restrictions Weight Bearing Restrictions: No Other Position/Activity Restrictions: sternal precautions    Mobility  Bed Mobility Overal bed mobility: Modified Independent Bed Mobility: Supine to Sit     Supine to sit: Modified independent (Device/Increase time)     General bed mobility comments: HOB flat,no use of rails.    Transfers Overall transfer level: Needs assistance Equipment used: None Transfers: Sit to/from [facility] Sit to Stand: Min guard Stand pivot transfers: Supervision;Min guard       General transfer comment:  Safe use of UE and precautions  Ambulation/Gait Ambulation/Gait assistance: Min guard Ambulation Distance (Feet): 500 Feet Assistive device: None Gait Pattern/deviations: Step-through pattern;Decreased stride length;Staggering right Gait velocity: Decreased   General Gait Details: Patient continues to be slow and guarded with gait with some right drift but no major LOB that she was not able to self correct. Incorporated head turns, obstacles and directional changes with gait with no LOB   Stairs   Stairs assistance: Min assist Stair Management: Step to pattern;Alternating pattern Number of Stairs: 10 General stair comments: Min A for HHA (to act as rail).   Wheelchair Mobility    Modified Rankin (Stroke Patients Only) Modified Rankin (Stroke Patients Only) Pre-Morbid Rankin Score: No symptoms Modified Rankin: Moderately severe disability     Balance     Sitting balance-Leahy Scale: Good       Standing balance-Leahy Scale: Fair                      Cognition Arousal/Alertness: Awake/alert Behavior During Therapy: WFL for tasks assessed/performed Overall Cognitive Status: Impaired/Different from baseline               Problem Solving: Slow processing;Requires verbal cues      Exercises Other Exercises Other Exercises: provided pt with theraputty (tan) and hand exercises/activities to increase strength and coordination. Encouraged pt to perform 10 minutes several times daily.  Other Exercises: Pt performed PNF pattern reaching with RUE to facilitate coordination and increased use of RUE in addition to trunk facilitation through dynamic reaching.     General Comments  Pertinent Vitals/Pain Pain Assessment: No/denies pain    Home Living                      Prior Function            PT Goals (current goals can now be found in the care plan section) Progress towards PT goals: Progressing toward goals    Frequency  Min  4X/week    PT Plan Discharge plan needs to be updated    Co-evaluation             End of Session Equipment Utilized During Treatment: Gait belt Activity Tolerance: Patient tolerated treatment well Patient left: in chair;with call bell/phone within reach;with chair alarm set     Time: [phone] PT Time Calculation (min) (ACUTE ONLY): 28 min  Charges:  $Gait Training: 8-22 mins $Therapeutic Activity: 8-22 mins                    G Codes:      Jacqualyn Posey 07/09/2014, 10:15 AM 07/09/2014 Jacqualyn Posey PTA [phone] pager [phone] office

## 2014-07-09 NOTE — Progress Notes (Signed)
Pt is being discharged home. discharge instructions were given to patient and family

## 2014-07-09 NOTE — Progress Notes (Signed)
Occupational Therapy Treatment Patient Details Name: Colleen Williamson MRN: [id number] DOB: 06/20/1946 Today's Date: 07/09/2014    History of present illness Colleen Williamson is an 68 y.o. female history of mitral valve disease and mitral valve replacement on 06/16/2014, as well as intermittent atrial fibrillation not on anticoagulation, presenting with new onset aphasia and right hemiplegia. MRI: Acute infarct left MCA territory. Infarct involves the body of the caudate but not the head of the caudate. Most of the putamen is involved with a small amount of hemorrhage in the putamen. There is a mild amount of infarct in the insular cortex and a small amount of infarct in the left lateral temporal lobe.   OT comments  Pt. Progressing well with acute OT goals.  Reports improved sensation in hand.  Improved balance and decreased foot drop noted during ambulation in room today.  Pt. Reports family has made arrangements that if pt. Able to d/c home.  Pts. Sister (who is in good health) is available to be with pt. During the day and also a nephew that is home on holiday break from college.  Note that previous CM note states pt. And family were leaning towards home with out patient therapies.   Have discussed this along with pts. Improvements with PT and she will indicate after tx. Session her recommendation.  From OT standpoint if pt. Has the 24/7 with out patient therapies feel she could return home without in pt. Rehab stay.    Follow Up Recommendations  CIR;Supervision/Assistance - 24 hour;Outpatient OT    Equipment Recommendations       Recommendations for Other Services      Precautions / Restrictions Precautions Precautions: Fall Restrictions Weight Bearing Restrictions: No Other Position/Activity Restrictions: sternal precautions       Mobility Bed Mobility Overal bed mobility: Modified Independent Bed Mobility: Supine to Sit     Supine to sit: Modified independent (Device/Increase time)      General bed mobility comments: HOB flat,no use of rails.    Transfers Overall transfer level: Needs assistance Equipment used: None Transfers: Sit to/from [facility] Sit to Stand: Min guard;Supervision Stand pivot transfers: Supervision;Min guard       General transfer comment: Verbal cues for sternal precautions, but once stated pt. able to incorporate intod transfers for remaining portion of session    Balance     Sitting balance-Leahy Scale: Good       Standing balance-Leahy Scale: Fair                     ADL Overall ADL's : Needs assistance/impaired                     Lower Body Dressing: Supervision/safety;Sitting/lateral leans Lower Body Dressing Details (indicate cue type and reason): able to cross L/R leg over knee for LB dressing Toilet Transfer: Min guard Toilet Transfer Details (indicate cue type and reason): simulated during ambulation in room from eob to [place] and Hygiene: Min guard;Sit to/from stand Toileting - Clothing Manipulation Details (indicate cue type and reason): simulated during transfer in room     Functional mobility during ADLs: Min guard General ADL Comments: pt. required min guard/S today.  states she is gaining more sensation in her R hand.  no LOB noted during ambulation in room.        Vision  Perception     Praxis      Cognition                             Extremity/Trunk Assessment               Exercises Other Exercises Other Exercises: provided pt with theraputty (tan) and hand exercises/activities to increase strength and coordination. Encouraged pt to perform 10 minutes several times daily.  Other Exercises: Pt performed PNF pattern reaching with RUE to facilitate coordination and increased use of RUE in addition to trunk facilitation through dynamic reaching.    Shoulder Instructions       General  Comments      Pertinent Vitals/ Pain          Home Living                                          Prior Functioning/Environment              Frequency Min 3X/week     Progress Toward Goals  OT Goals(current goals can now be found in the care plan section)  Progress towards OT goals: Progressing toward goals     Plan Discharge plan remains appropriate;Discharge plan needs to be updated    Co-evaluation                 End of Session Equipment Utilized During Treatment: Gait belt   Activity Tolerance Patient tolerated treatment well   Patient Left in chair;with call bell/phone within reach   Nurse Communication Other (comment) (dispo: cir vs home with out pt. per cm documentation)        Time: [id number] OT Time Calculation (min): 24 min  Charges: OT General Charges $OT Visit: 1 Procedure OT Treatments $Self Care/Home Management : 8-22 mins $Therapeutic Exercise: 8-22 mins  Janice Coffin 07/09/2014, 8:34 AM

## 2014-07-12 DIAGNOSIS — I639 Cerebral infarction, unspecified: Secondary | ICD-10-CM | POA: Insufficient documentation

## 2014-07-13 ENCOUNTER — Other Ambulatory Visit (HOSPITAL_COMMUNITY): Payer: Self-pay | Admitting: Interventional Radiology

## 2014-07-13 ENCOUNTER — Telehealth (HOSPITAL_COMMUNITY): Payer: Self-pay | Admitting: Interventional Radiology

## 2014-07-13 DIAGNOSIS — I639 Cerebral infarction, unspecified: Secondary | ICD-10-CM

## 2014-07-13 NOTE — Telephone Encounter (Signed)
Called pt, left VM for her to call to schedule 4 wk f/u clinical visit p/s DC hospital stay for acute stroke. JM

## 2014-08-03 ENCOUNTER — Telehealth (HOSPITAL_COMMUNITY): Payer: Self-pay | Admitting: Interventional Radiology

## 2014-08-03 NOTE — Telephone Encounter (Signed)
Called pt, left VM for her to call to schedule f/u consult following her recent acute stroke visit. JM

## 2014-09-02 NOTE — Progress Notes (Signed)
Patient ID: [name], female   DOB: 1946/02/16, 69 y.o.   MRN: [id number]    Cardiology Office Note   Date:  09/03/2014   ID:  Colleen Williamson, DOB 05/04/1946, MRN [id number]  PCP:  Colleen Crouch, MD  Cardiologist:   Colleen Rouge, MD   No chief complaint on file.     History of Present Illness: Colleen Williamson is a 69 y.o. female who presents for evaluation of afib.  History of mitral valve disease and mitral valve repair on 06/12/2014 at [facility], as well as paroxysmal atrial fibrillation not on anticoagulation Unfortunately presented to [facility] on  07/03/14  with new onset aphasia and right hemiplegia. Had interventional  Procedure and started on Eliquis Echo 07/03/14 reviewed no SOE  TEE not done  Reviewed over 50 pages of records from [facility]  Her insurance changed and they are no longer covered  Op note indicates no MAZE or LAA exclusion.  Had posterior leaflet repair with Colvin-Galloway annuloplasty band.  EF normal  No CAD on cath prior to surgery.   Has had problems with LA diltiazem but doesn't like taking current med 4x/day.  Still in rapid atrial flutter  Today rate 110. Significant fatigue and dyspnea.    Discussed options feel getting her on antiarrhythmic important as she has gone in / out of flutter multiple times  Also change to LA verapamil for rate control with beta blocker She understands the need for blood work and proarrhythmia with flecainide  No CAD at cath.  She has not missed any doses of eliquis  If she fails medical Rx can refer for flutter ablation.   Study Conclusions  - Left ventricle: The cavity size was normal. Systolic function was normal. The estimated ejection fraction was in the range of 55% to 60%. Wall motion was normal; there were no regional wall motion abnormalities. Features are consistent with a pseudonormal left ventricular filling pattern, with concomitant abnormal relaxation and increased filling pressure (grade 2 diastolic dysfunction). -  Mitral valve: Annuloplasty ring from previous mitral valve repair is intact. Mildly thickened leaflets with mild doming of the anterior leaflet in diastole noted. Fixed inferior leaflet. The findings are consistent with mild stenosis. Mean gradient (D): 5 mm Hg. Valve area by pressure half-time: 1.93 cm^2. - Left atrium: The atrium was mildly dilated. - Right ventricle: The cavity size was mildly dilated. Wall thickness was normal. - Right atrium: The atrium was mildly dilated.  Impressions:  - No cardiac source of emboli was indentified.    Past Medical History  Diagnosis Date  . Abnormal mammogram, unspecified 2014  . Breast cyst   . Stroke 07/03/2014    Past Surgical History  Procedure Laterality Date  . Abdominal hysterectomy  2004  . Eye surgery  2007  . Mitral valve repair  05/2014  . Radiology with anesthesia N/A 07/03/2014    Procedure: RADIOLOGY WITH ANESTHESIA;  Surgeon: Rob Hickman, MD;  Location: [place];  Service: Radiology;  Laterality: N/A;     Current Outpatient Prescriptions  Medication Sig Dispense Refill  . apixaban (ELIQUIS) 5 MG TABS tablet Take 1 tablet (5 mg total) by mouth 2 (two) times daily. 60 tablet 2  . diltiazem (CARDIZEM) 90 MG tablet Take 90 mg by mouth 4 (four) times daily.    . meclizine (ANTIVERT) 25 MG tablet Take 1 mg by mouth daily.    . metoprolol tartrate (LOPRESSOR) 25 MG tablet 25 mg 2 (two) times daily. TAKE 1  TABLET BY MOUTH TWICE A DAY PRN PALPITATIONS     No current facility-administered medications for this visit.    Allergies:   Sulfa antibiotics    Social History:  The patient  reports that she has quit smoking. She has never used smokeless tobacco. She reports that she does not drink alcohol or use illicit drugs.   Family History:  The patient's family history is not on file.    ROS:  Please see the history of present illness.   Otherwise, review of systems are positive for none.   All other systems  are reviewed and negative.    PHYSICAL EXAM: VS:  BP 110/64 mmHg  Pulse 110  Ht 5\' 10"  (1.778 m)  Wt 79.379 kg (175 lb)  BMI 25.11 kg/m2 , BMI Body mass index is 25.11 kg/(m^2). GEN: Well nourished, well developed, in no acute distress HEENT: normal Neck: no JVD, carotid bruits, or masses Cardiac: RRR; no murmurs, rubs, or gallops,no edema  Respiratory:  clear to auscultation bilaterally, normal work of breathing GI: soft, nontender, nondistended, + BS MS: no deformity or atrophy Skin: warm and dry, no rash Neuro:  Strength and sensation are intact Psych: euthymic mood, full affect   EKG:   07/04/14  SR with PVC  Rate 71  LAE  ASMI   09/03/14  Atrial flutter rate 110  RSAD    Recent Labs: 07/03/2014: ALT 14 07/04/2014: Hemoglobin 10.6*; Platelets 196 07/06/2014: BUN 6; Creatinine 0.44*; Magnesium 1.7; Potassium 3.5*; Sodium 141    Lipid Panel    Component Value Date/Time   CHOL 132 07/04/2014 0442   TRIG 153* 07/04/2014 0442   HDL 46 07/04/2014 0442   CHOLHDL 2.9 07/04/2014 0442   VLDL 31 07/04/2014 0442   LDLCALC 55 07/04/2014 0442      Wt Readings from Last 3 Encounters:  09/03/14 79.379 kg (175 lb)  07/05/14 85.2 kg (187 lb 13.3 oz)  01/03/13 81.194 kg (179 lb)      Other studies Reviewed: Additional studies/ records that were reviewed today include:  [facility] records and op report .    ASSESSMENT AND PLAN:  1.  Flutter :  Change cardizem to verapamil  Continue beta blocker.  Discussed options at length  Start flecainide 50 bid No CAD at cath.  ETT 10 days  BMET And CBC today  [place] in 3 weeks if doesn't chemically convert.  On Rx Eliquis since December If not successful refer for ablation 2.  MVR:  No residual murmur echo with normal EF post op  SBE prophylaxis no MAZE or LAA exclusion done 3>  CVA  Mild residual right sided weakness likely need life long anticoagulation given MVD, flutter and CVA   Current medicines are reviewed at length with the patient today.   The patient does not have concerns regarding medicines.  The following changes have been made:  D/C cardizem  Add verapamil and flecainide  Labs/ tests ordered today include:  CBC BMET  ETT    No orders of the defined types were placed in this encounter.     Disposition:   [facility] 2/26  Orders written had to call endo and anesthesia to schedule     Signed, Colleen Rouge, MD  09/03/2014 11:09 AM    [facility] [address], [place], [place]  [address] Phone: [phone]; Fax: [phone]

## 2014-09-03 ENCOUNTER — Encounter: Payer: Self-pay | Admitting: *Deleted

## 2014-09-03 ENCOUNTER — Encounter: Payer: Self-pay | Admitting: Cardiovascular Disease

## 2014-09-03 ENCOUNTER — Ambulatory Visit (INDEPENDENT_AMBULATORY_CARE_PROVIDER_SITE_OTHER): Payer: PPO | Admitting: Cardiovascular Disease

## 2014-09-03 VITALS — BP 110/64 | HR 110 | Ht 70.0 in | Wt 175.0 lb

## 2014-09-03 DIAGNOSIS — I4892 Unspecified atrial flutter: Secondary | ICD-10-CM

## 2014-09-03 DIAGNOSIS — Z79899 Other long term (current) drug therapy: Secondary | ICD-10-CM

## 2014-09-03 LAB — CBC WITH DIFFERENTIAL/PLATELET
BASOS ABS: 0.1 10*3/uL (ref 0.0–0.1)
Basophils Relative: 1 % (ref 0.0–3.0)
Eosinophils Absolute: 0.2 10*3/uL (ref 0.0–0.7)
Eosinophils Relative: 3.2 % (ref 0.0–5.0)
HCT: 41.7 % (ref 36.0–46.0)
HEMOGLOBIN: 13.9 g/dL (ref 12.0–15.0)
LYMPHS ABS: 1.7 10*3/uL (ref 0.7–4.0)
Lymphocytes Relative: 29.8 % (ref 12.0–46.0)
MCHC: 33.4 g/dL (ref 30.0–36.0)
MCV: 77.9 fl — ABNORMAL LOW (ref 78.0–100.0)
MONO ABS: 0.5 10*3/uL (ref 0.1–1.0)
Monocytes Relative: 8.1 % (ref 3.0–12.0)
NEUTROS ABS: 3.3 10*3/uL (ref 1.4–7.7)
Neutrophils Relative %: 57.9 % (ref 43.0–77.0)
Platelets: 220 10*3/uL (ref 150.0–400.0)
RBC: 5.35 Mil/uL — ABNORMAL HIGH (ref 3.87–5.11)
RDW: 15 % (ref 11.5–15.5)
WBC: 5.7 10*3/uL (ref 4.0–10.5)

## 2014-09-03 LAB — BASIC METABOLIC PANEL
BUN: 22 mg/dL (ref 6–23)
CHLORIDE: 104 meq/L (ref 96–112)
CO2: 27 meq/L (ref 19–32)
Calcium: 9.9 mg/dL (ref 8.4–10.5)
Creatinine, Ser: 0.73 mg/dL (ref 0.40–1.20)
GFR: 84.63 mL/min (ref >60.00)
Glucose, Bld: 92 mg/dL (ref 70–99)
Potassium: 4.1 mEq/L (ref 3.5–5.1)
Sodium: 138 mEq/L (ref 135–145)

## 2014-09-03 MED ORDER — FLECAINIDE ACETATE 50 MG PO TABS: 50.0000 mg | ORAL_TABLET | Freq: Two times a day (BID) | ORAL | Status: DC

## 2014-09-03 MED ORDER — VERAPAMIL HCL ER 180 MG PO TBCR: 180.0000 mg | EXTENDED_RELEASE_TABLET | Freq: Every day | ORAL | Status: DC

## 2014-09-03 NOTE — Patient Instructions (Signed)
Your physician has recommended that you have a Cardioversion (DCCV). Electrical Cardioversion uses a jolt of electricity to your heart either through paddles or wired patches attached to your chest. This is a controlled, usually prescheduled, procedure. Defibrillation is done under light anesthesia in the hospital, and you usually go home the day of the procedure. This is done to get your heart back into a normal rhythm. You are not awake for the procedure. Please see the instruction sheet given to you today.  [place] has recommended you make the following change in your medication: . STOP  DILTIAZEM START  FLECAINIDE   50 MG  TWICE  DAILY  VERAPAMIL   180 MG  EVERY DAY  Your physician has requested that you have an exercise tolerance test. For further information please visit [email or web address]. Please also follow instruction sheet, as given.  7  - 10  DAYS Your physician recommends that you return for lab work in: [place]

## 2014-09-04 ENCOUNTER — Telehealth: Payer: Self-pay | Admitting: *Deleted

## 2014-09-04 NOTE — Telephone Encounter (Signed)
Follow up    Patient calling back to speak with the nurse from today

## 2014-09-04 NOTE — Telephone Encounter (Signed)
LM  TO CALL BACK  PT NEEDS TO ARRIVE AT  SHORT  STAY AT [facility]  ON 2-26  AT 6:30 AM .Adonis Housekeeper

## 2014-09-04 NOTE — Telephone Encounter (Signed)
PT AWARE   OF THE NEED TO BE  AT SHORT  STAY ON 09-21-14 AT  6:30 AM  FOR  8:00 AM PROCEDURE .Adonis Housekeeper

## 2014-09-04 NOTE — Telephone Encounter (Signed)
F/U        Pt returning call from earlier today.    Please call.

## 2014-09-06 ENCOUNTER — Telehealth: Payer: Self-pay | Admitting: Cardiovascular Disease

## 2014-09-06 NOTE — Telephone Encounter (Signed)
Pt calls today because " my heart felt like it was beating out of my chest this morning" States she was up walking around in the house this am when onset began.  Pt states she took a Diltiazem 90mg  & the rapid heart rate subsided ( pt does not know rate)  She last took Verapamil 180 09/05/14 at bedtime.  She does not want to take the Flecainide as she thinks it has precipitated the rapid heart rate. Last dose of Flecainide 50mg  09/05/14 at bedtime.  Pt states she is feeling okay   Pt does not want to take Flecainide She would like medication advice from Dr. Johnsie Cancel. Forwarded to Dr. Johnsie Cancel for review Horton Chin RN

## 2014-09-06 NOTE — Telephone Encounter (Signed)
New Prob   Pt c/o medication issue:  1. Name of Medication: Deratamil  2. How are you currently taking this medication (dosage and times per day)? 180 mg once daily  3. Are you having a reaction (difficulty breathing--STAT)? No  4. What is your medication issue? Pt states medication is not working and her heart is currently out of rhythm

## 2014-09-06 NOTE — Telephone Encounter (Signed)
cardizem and verapamil will slow HR if in afib but won't prevent it  Flecainide is better at preventing episodes and she should try it

## 2014-09-06 NOTE — Telephone Encounter (Signed)
Pt understands as before that the diltiazem & verapamil were for rate control. She will stop the Verapamil as she states this did not help her heart rate.  She will continue the following medications as directed:  Metoprolol succinate 25mg  daily  Eliquis 5mg  twice a day as directed Diltiazem 90mg  four times a day Flecainide 50 mg every 12 hours ( she was taking it at lunch & supper)  Medication list updated. Horton Chin RN

## 2014-09-10 ENCOUNTER — Encounter: Payer: PPO | Admitting: Physician Assistant

## 2014-09-10 ENCOUNTER — Telehealth: Payer: Self-pay | Admitting: Cardiovascular Disease

## 2014-09-10 NOTE — Telephone Encounter (Signed)
Informed patient that Dr. Johnsie Cancel recommends having both [facility] and ETT. Informed her that [facility] will call to schedule ASAP. Patient agrees with treatment plan.

## 2014-09-10 NOTE — Telephone Encounter (Signed)
Ok to do after [facility] if needed Schedule ETT as soon as possible

## 2014-09-10 NOTE — Telephone Encounter (Signed)
New Msg          Pt cancelled stress test today due to weather.  Does pt need to have stress test completed before procedure that is scheduled on 09/21/14?  Please call pt and advise.

## 2014-09-10 NOTE — Telephone Encounter (Signed)
Patient was unable to attend ETT today due to inclement weather. Patient is concerned because Dr. Johnsie Cancel required ETT BEFORE cardioversion. ETT unable to be re-scheduled before cardioversion at this time. Patient inquiring as to whether Dr. Johnsie Cancel still wants ETT if it is done after cardioversion or if everything needs to be rescheduled.  Informed patient that Dr. Johnsie Cancel is not in the office today but she will receive a follow-up call from his nurse upon return.

## 2014-09-17 ENCOUNTER — Telehealth: Payer: Self-pay | Admitting: Cardiovascular Disease

## 2014-09-17 NOTE — Telephone Encounter (Signed)
Calling wanting to verify what medication she can take on Friday prior to Cardioversion.  Advised to take Eliquis and all of the morning meds with a sip of water.  She should be at short stay at 6:00 AM and someone needs to come with you to drive home. She verbalizes understanding.

## 2014-09-17 NOTE — Telephone Encounter (Signed)
New Message     Patient needs to know what medications she is suppose to take before her shock treatment on Friday 09/21/14 Please give patient a call back.

## 2014-09-20 ENCOUNTER — Encounter: Payer: Self-pay | Admitting: Neurology

## 2014-09-20 ENCOUNTER — Ambulatory Visit (INDEPENDENT_AMBULATORY_CARE_PROVIDER_SITE_OTHER): Payer: PPO | Admitting: Neurology

## 2014-09-20 VITALS — BP 102/60 | HR 82 | Ht 68.0 in | Wt 174.2 lb

## 2014-09-20 DIAGNOSIS — I63412 Cerebral infarction due to embolism of left middle cerebral artery: Secondary | ICD-10-CM

## 2014-09-20 DIAGNOSIS — I480 Paroxysmal atrial fibrillation: Secondary | ICD-10-CM

## 2014-09-20 DIAGNOSIS — Z9889 Other specified postprocedural states: Secondary | ICD-10-CM | POA: Insufficient documentation

## 2014-09-20 NOTE — Patient Instructions (Signed)
-   continue eliquis for stroke prevention - follow up with cardiology for afib treatment - Follow up with your primary care physician for stroke risk factor modification. Recommend maintain blood pressure goal <130/80, diabetes with hemoglobin A1c goal below 6.5% and lipids with LDL cholesterol goal below 70 mg/dL.  - check BP at home.  - follow up in 3 months.

## 2014-09-20 NOTE — Progress Notes (Signed)
STROKE NEUROLOGY FOLLOW UP NOTE  NAME: Colleen Williamson DOB: 05-11-1946  REASON FOR VISIT: stroke follow up HISTORY FROM: pt and chart  Today we had the pleasure of seeing Colleen Williamson in follow-up at our Neurology Clinic. Pt was accompanied by a friend.   History Summary Colleen Williamson is an 69 year old woman with history of mitral valve repair on 06/12/2014 and paroxysmal atrial fibrillation on ASA, who presented on 07/03/14 for acute onset aphasia and right hemiplegia. There is no previous history of stroke nor TIA. Exact time of onset is unclear, last seen well at 10 PM on 07/02/14. CT scan of her head showed decreased gray-white differentiation along the left insular cortex. CT perfusion study showed a large area of left MCA territory ischemia reduced mean transit time and reduce cerebral flow. Blood volume however was mostly preserved. Patient was not administered tPA secondary to open heart surgery with mitral valve replacement on 06/16/14, in addition she was outside the window for tPA. She was taken to IR and crebral angiogram showed left MCA M1 occlusion. NIH stroke score was 25. She underwent revascularization of the occluded artery with intra-arterial Integrelin as well as mechanical thrombectomy. MRI showed left MCA M1 territory stroke. Her symptoms improved well and she was discharge to home with [facility] PT/OT and eliquis 5mg  bid.   Interval History During the interval time, the patient has been doing well. No recurrent stroke like symptoms. She continued to have symptomatic Afib with intermittent palpitation and sweating, lasting hours to days. She followed up with cardiology and put on flecainide and verapamil but she can not tolerate both. She is going to have cardioversion, and if unsuccessful, the ablation. Her BP today in clinic 102/60. She is on eliquis, metoprolol and cardizem.     REVIEW OF SYSTEMS: Full 14 system review of systems performed and notable only for those listed below and in  HPI above, all others are negative:  Constitutional:   Cardiovascular:  Ear/Nose/Throat:   Skin:  Eyes:   Respiratory:   Gastroitestinal:   Genitourinary:  Hematology/Lymphatic:   Endocrine:  Musculoskeletal:   Allergy/Immunology:   Neurological:   Psychiatric:  Sleep:   The following represents the patient's updated allergies and side effects list: Allergies  Allergen Reactions  . Sulfa Antibiotics Nausea Only    The neurologically relevant items on the patient's problem list were reviewed on today's visit.  Neurologic Examination  A problem focused neurological exam (12 or more points of the single system neurologic examination, vital signs counts as 1 point, cranial nerves count for 8 points) was performed.  Blood pressure 102/60, pulse 82, height 5\' 8"  (1.727 m), weight 174 lb 3.2 oz (79.017 kg).  General - Well nourished, well developed, in no apparent distress.  Ophthalmologic - Sharp disc margins OU.  Cardiovascular - irregularly irregular heart rate and rhythm.  Mental Status -  Level of arousal and orientation to time, place, and person were intact. Language including expression, naming, repetition, comprehension was assessed and found intact. Attention span and concentration were normal. Recent and remote memory were intact. Fund of Knowledge was assessed and was intact.  Cranial Nerves II - XII - II - Visual field intact OU. III, IV, VI - Extraocular movements intact. V - Facial sensation intact bilaterally. VII - Facial movement intact bilaterally. VIII - Hearing & vestibular intact bilaterally. X - Palate elevates symmetrically XI - Chin turning & shoulder shrug intact bilaterally. XII - Tongue protrusion intact.  Motor Strength - The patient's strength was normal in all extremities and pronator drift was absent.  Bulk was normal and fasciculations were absent.   Motor Tone - Muscle tone was assessed at the neck and appendages and was  normal.  Reflexes - The patient's reflexes were normal in all extremities and she had no pathological reflexes.  Sensory - Light touch, temperature/pinprick, vibration and proprioception, and Romberg testing were assessed and were normal.    Coordination - The patient had normal movements in the hands and feet with no ataxia or dysmetria.  Tremor was absent.  Gait and Station - The patient's transfers, posture, gait, station, and turns were observed as normal.  Data reviewed: I personally reviewed the images and agree with the radiology interpretations.  Ct Head Williamson Contrast 07/03/2014 Post retrieval of left M1 segment thrombus. At this level, subtle hyperdensity (series 201, image 13) may represent enhancing parenchyma rather than peri vascular hemorrhage however, attention to this on follow up. Left middle cerebral artery distribution infarct. This is more apparent than on the prior CT. Hyperdensity of the left lenticular nucleus and caudate may reflect enhancement from recent contrast injection (although hemorrhagic transformation of acute infarct cannot be entirely excluded). This can be evaluated on follow up.  07/03/2014 1. Acute nonhemorrhagic infarct involving the left MCA territory is evidenced by decreased gray-white differentiation along the left insular cortex. 2. No acute hemorrhage.   Ct Cerebral Perfusion W/cm 07/03/2014 1. Large area of left MCA territory ischemia with and decreased mean transit time and decreased cerebral blood flow. Cerebral blood volume is mostly preserved, suggesting this may be reversible. Electronically Signed By: Lawrence Santiago M.D. On: 07/03/2014 09:41   Cerebral Angiogram 07/04/2014 Status post complete angiographic TICI 3, revascularization of occluded left M1 segment proximally using a single pass with a TREVOPROVUE 4 mm x 30 mm stent retrieval device and 3 mg of superselective intracranial intra-arterial Integrelin.   Colleen Williamson  Contrast 07/04/2014 Acute left MCA infarct involving the body the caudate comminuted putamen, insula, and lateral temporal lobe as above. Small amount of hemorrhage in the left putamen.   Colleen Williamson Nam Head/brain Williamson Cm 07/04/2014 MRA reveals successful re- cannulization of left M1 segment which is widely patent. No branch occlusion identified on the left. Mild intracranial atherosclerotic disease is present.   Dg Chest Port 1 View 07/04/2014 1. Lines and tubes in stable position. 2. Bibasilar pulmonary infiltrates and bilateral pleural effusions, right greater than left. 3. Stable cardiomegaly with normal pulmonary vascularity. Prior CABG. Prior cardiac valve replacement.  07/03/2014 1. Bilateral pleural effusions and bibasilar atelectasis/consolidation. 2. Support hardware in expected position.  CUS - Bilateral: 1-39% ICA stenosis. Vertebral artery flow is antegrade.  2D echo - Left ventricle: The cavity size was normal. Systolic function was normal. The estimated ejection fraction was in the range of 55% to 60%. Wall motion was normal; there were no regional wall motion abnormalities. Features are consistent with a pseudonormal left ventricular filling pattern, with concomitant abnormal relaxation and increased filling pressure (grade 2 diastolic dysfunction). - Mitral valve: Annuloplasty ring from previous mitral valve repair is intact. Mildly thickened leaflets with mild doming of the anterior leaflet in diastole noted. Fixed inferior leaflet. The findings are consistent with mild stenosis. Mean gradient (D): 5 mm Hg. Valve area by pressure half-time: 1.93 cm^2. - Left atrium: The atrium was mildly dilated. - Right ventricle: The cavity size was mildly dilated. Wall thickness was normal. - Right atrium: The atrium was mildly  dilated.  Impressions: - No cardiac source of emboli was indentified.  Component     Latest Ref Rng 07/04/2014  Cholesterol      0 - 200 mg/dL 132  Triglycerides     <150 mg/dL 153 (H)  HDL     >39 mg/dL 46  Total CHOL/HDL Ratio      2.9  VLDL     0 - 40 mg/dL 31  LDL (calc)     0 - 99 mg/dL 55  Hemoglobin A1C     <5.7 % 5.6  Mean Plasma Glucose     <117 mg/dL 114    Assessment: As you may recall, she is a 69 y.o. Caucasian female with PMH of MVR in 05/2014 and pAfib on ASA was admitted in 06/2014 for acute onset aphasia and right hemiparesis. CTA and CTP showed large penumbra. She can not have tPA but underwent IR with IA integrelin and mechanical thrombectomy with TICI3 recannulization. MRI showed left MCA M1 infarct. Pt recovered well, currently on eliquis for stroke prevention. Still has symptomatic afib, followed with cardiology for cardioversion and ablation if necessary.  Plan:  - continue eliquis for stroke prevention - follow up with cardiology for afib management - Follow up with your primary care physician for stroke risk factor modification. Recommend maintain blood pressure goal <130/80, diabetes with hemoglobin A1c goal below 6.5% and lipids with LDL cholesterol goal below 70 mg/dL.  - check BP at home - RTC in 3 months.  No orders of the defined types were placed in this encounter.    Meds ordered this encounter  Medications  . amoxicillin (AMOXIL) 500 MG tablet    Sig: Take 500 mg by mouth as needed. DENTAL PROCEDURES ONLY    Patient Instructions  - continue eliquis for stroke prevention - follow up with cardiology for afib treatment - Follow up with your primary care physician for stroke risk factor modification. Recommend maintain blood pressure goal <130/80, diabetes with hemoglobin A1c goal below 6.5% and lipids with LDL cholesterol goal below 70 mg/dL.  - check BP at home.  - follow up in 3 months.   Rosalin Hawking, MD PhD [facility] [address], [place], [place] [address] [phone]

## 2014-09-21 ENCOUNTER — Ambulatory Visit (HOSPITAL_COMMUNITY): Payer: PPO | Admitting: Anesthesiology

## 2014-09-21 ENCOUNTER — Encounter (HOSPITAL_COMMUNITY): Payer: Self-pay | Admitting: *Deleted

## 2014-09-21 ENCOUNTER — Encounter (HOSPITAL_COMMUNITY)
Admission: RE | Disposition: A | Discharge status: Home / Self Care | Payer: Self-pay | Source: Ambulatory Visit | Attending: Cardiovascular Disease

## 2014-09-21 ENCOUNTER — Ambulatory Visit (HOSPITAL_COMMUNITY)
Admission: RE | Admit: 2014-09-21 | Discharge: 2014-09-21 | Disposition: A | Discharge status: Home / Self Care | Payer: PPO | Source: Ambulatory Visit | Attending: Cardiovascular Disease | Admitting: Cardiovascular Disease

## 2014-09-21 DIAGNOSIS — Z8673 Personal history of transient ischemic attack (TIA), and cerebral infarction without residual deficits: Secondary | ICD-10-CM | POA: Diagnosis not present

## 2014-09-21 DIAGNOSIS — I4891 Unspecified atrial fibrillation: Secondary | ICD-10-CM

## 2014-09-21 DIAGNOSIS — Z87891 Personal history of nicotine dependence: Secondary | ICD-10-CM | POA: Insufficient documentation

## 2014-09-21 DIAGNOSIS — I4892 Unspecified atrial flutter: Secondary | ICD-10-CM | POA: Insufficient documentation

## 2014-09-21 DIAGNOSIS — I10 Essential (primary) hypertension: Secondary | ICD-10-CM | POA: Insufficient documentation

## 2014-09-21 DIAGNOSIS — Z7901 Long term (current) use of anticoagulants: Secondary | ICD-10-CM | POA: Diagnosis not present

## 2014-09-21 DIAGNOSIS — Z882 Allergy status to sulfonamides status: Secondary | ICD-10-CM | POA: Diagnosis not present

## 2014-09-21 HISTORY — PX: CARDIOVERSION: SHX1299

## 2014-09-21 SURGERY — CARDIOVERSION
Anesthesia: General

## 2014-09-21 MED ORDER — SODIUM CHLORIDE 0.9 % IV SOLN
INTRAVENOUS | Status: DC | PRN
  Administered 2014-09-21: 08:00:00 via INTRAVENOUS

## 2014-09-21 MED ORDER — PROPOFOL 10 MG/ML IV BOLUS
INTRAVENOUS | Status: DC | PRN
  Administered 2014-09-21: 60 mg via INTRAVENOUS

## 2014-09-21 MED ORDER — SODIUM CHLORIDE 0.9 % IV SOLN
INTRAVENOUS | Status: DC
  Administered 2014-09-21: 500 mL via INTRAVENOUS

## 2014-09-21 NOTE — Anesthesia Preprocedure Evaluation (Addendum)
Anesthesia Evaluation  Patient identified by MRN, date of birth, ID band Patient awake    Reviewed: Allergy & Precautions, H&P , NPO status , Patient's Chart, lab work & pertinent test results, reviewed documented beta blocker date and time   Airway Mallampati: II  TM Distance: >3 FB Neck ROM: Full    Dental no notable dental hx. (+) Teeth Intact, Dental Advisory Given   Pulmonary neg pulmonary ROS, former smoker,  breath sounds clear to auscultation  Pulmonary exam normal       Cardiovascular hypertension, On Home Beta Blockers + dysrhythmias Atrial Fibrillation Rhythm:Regular Rate:Normal     Neuro/Psych CVA negative psych ROS   GI/Hepatic negative GI ROS, Neg liver ROS,   Endo/Other  negative endocrine ROS  Renal/GU negative Renal ROS  negative genitourinary   Musculoskeletal   Abdominal   Peds  Hematology negative hematology ROS (+)   Anesthesia Other Findings   Reproductive/Obstetrics negative OB ROS                            Anesthesia Physical Anesthesia Plan  ASA: III  Anesthesia Plan: General   Post-op Pain Management:    Induction: Intravenous  Airway Management Planned: Simple Face Mask  Additional Equipment:   Intra-op Plan:   Post-operative Plan:   Informed Consent: I have reviewed the patients History and Physical, chart, labs and discussed the procedure including the risks, benefits and alternatives for the proposed anesthesia with the patient or authorized representative who has indicated his/her understanding and acceptance.   Dental advisory given  Plan Discussed with: CRNA  Anesthesia Plan Comments:         Anesthesia Quick Evaluation

## 2014-09-21 NOTE — Discharge Instructions (Signed)
Stop cardizem Continue eliquis flecainide and lopressor F/U Dr Johnsie Cancel office will call

## 2014-09-21 NOTE — CV Procedure (Signed)
[facility]: Anesthesia Dr Ola Spurr  60 mg propofol and 40 mg lidocaine [place] X1 120 J synch biphasic   Converted to NSR rate 52 from atrial flutter rate 75 On Rx Eliquis No immediate neurologic sequelae  Jenkins Rouge

## 2014-09-21 NOTE — Anesthesia Postprocedure Evaluation (Signed)
  Anesthesia Post-op Note  Patient: Colleen Williamson  Procedure(s) Performed: Procedure(s): CARDIOVERSION (N/A)  Patient Location: Endoscopy Unit  Anesthesia Type:MAC  Level of Consciousness: awake, alert , oriented and patient cooperative  Airway and Oxygen Therapy: Patient Spontanous Breathing and Patient connected to nasal cannula oxygen  Post-op Pain: none  Post-op Assessment: Post-op Vital signs reviewed, Patient's Cardiovascular Status Stable, Respiratory Function Stable, Patent Airway and No signs of Nausea or vomiting  Post-op Vital Signs: Reviewed and stable  Last Vitals:  Filed Vitals:   09/21/14 0736  BP: 111/68  Temp:   Resp:     Complications: No apparent anesthesia complications

## 2014-09-21 NOTE — Transfer of Care (Signed)
Immediate Anesthesia Transfer of Care Note  Patient: Colleen Williamson  Procedure(s) Performed: Procedure(s): CARDIOVERSION (N/A)  Patient Location: Endoscopy Unit  Anesthesia Type:MAC  Level of Consciousness: awake, alert , oriented and patient cooperative  Airway & Oxygen Therapy: Patient Spontanous Breathing  Post-op Assessment: Report given to RN, Post -op Vital signs reviewed and stable and Patient moving all extremities  Post vital signs: Reviewed and stable  Last Vitals:  Filed Vitals:   09/21/14 0736  BP: 111/68  Temp:   Resp:     Complications: No apparent anesthesia complications

## 2014-09-21 NOTE — H&P (View-Only) (Signed)
Patient ID: Colleen Williamson, female   DOB: 02-01-1946, 69 y.o.   MRN: [id number]    Cardiology Office Note   Date:  09/03/2014   ID:  [name], DOB 06-21-1946, MRN [id number]  PCP:  Idelle Crouch, MD  Cardiologist:   Jenkins Rouge, MD   No chief complaint on file.     History of Present Illness: Colleen Williamson is a 69 y.o. female who presents for evaluation of afib.  History of mitral valve disease and mitral valve repair on 06/12/2014 at [facility], as well as paroxysmal atrial fibrillation not on anticoagulation Unfortunately presented to [facility] on  07/03/14  with new onset aphasia and right hemiplegia. Had interventional  Procedure and started on Eliquis Echo 07/03/14 reviewed no SOE  TEE not done  Reviewed over 50 pages of records from [facility]  Her insurance changed and they are no longer covered  Op note indicates no MAZE or LAA exclusion.  Had posterior leaflet repair with Colvin-Galloway annuloplasty band.  EF normal  No CAD on cath prior to surgery.   Has had problems with LA diltiazem but doesn't like taking current med 4x/day.  Still in rapid atrial flutter  Today rate 110. Significant fatigue and dyspnea.    Discussed options feel getting her on antiarrhythmic important as she has gone in / out of flutter multiple times  Also change to LA verapamil for rate control with beta blocker She understands the need for blood work and proarrhythmia with flecainide  No CAD at cath.  She has not missed any doses of eliquis  If she fails medical Rx can refer for flutter ablation.   Study Conclusions  - Left ventricle: The cavity size was normal. Systolic function was normal. The estimated ejection fraction was in the range of 55% to 60%. Wall motion was normal; there were no regional wall motion abnormalities. Features are consistent with a pseudonormal left ventricular filling pattern, with concomitant abnormal relaxation and increased filling pressure (grade 2 diastolic dysfunction). -  Mitral valve: Annuloplasty ring from previous mitral valve repair is intact. Mildly thickened leaflets with mild doming of the anterior leaflet in diastole noted. Fixed inferior leaflet. The findings are consistent with mild stenosis. Mean gradient (D): 5 mm Hg. Valve area by pressure half-time: 1.93 cm^2. - Left atrium: The atrium was mildly dilated. - Right ventricle: The cavity size was mildly dilated. Wall thickness was normal. - Right atrium: The atrium was mildly dilated.  Impressions:  - No cardiac source of emboli was indentified.    Past Medical History  Diagnosis Date  . Abnormal mammogram, unspecified 2014  . Breast cyst   . Stroke 07/03/2014    Past Surgical History  Procedure Laterality Date  . Abdominal hysterectomy  2004  . Eye surgery  2007  . Mitral valve repair  05/2014  . Radiology with anesthesia N/A 07/03/2014    Procedure: RADIOLOGY WITH ANESTHESIA;  Surgeon: Rob Hickman, MD;  Location: [place];  Service: Radiology;  Laterality: N/A;     Current Outpatient Prescriptions  Medication Sig Dispense Refill  . apixaban (ELIQUIS) 5 MG TABS tablet Take 1 tablet (5 mg total) by mouth 2 (two) times daily. 60 tablet 2  . diltiazem (CARDIZEM) 90 MG tablet Take 90 mg by mouth 4 (four) times daily.    . meclizine (ANTIVERT) 25 MG tablet Take 1 mg by mouth daily.    . metoprolol tartrate (LOPRESSOR) 25 MG tablet 25 mg 2 (two) times daily. TAKE 1  TABLET BY MOUTH TWICE A DAY PRN PALPITATIONS     No current facility-administered medications for this visit.    Allergies:   Sulfa antibiotics    Social History:  The patient  reports that she has quit smoking. She has never used smokeless tobacco. She reports that she does not drink alcohol or use illicit drugs.   Family History:  The patient's family history is not on file.    ROS:  Please see the history of present illness.   Otherwise, review of systems are positive for none.   All other systems  are reviewed and negative.    PHYSICAL EXAM: VS:  BP 110/64 mmHg  Pulse 110  Ht 5\' 10"  (1.778 m)  Wt 79.379 kg (175 lb)  BMI 25.11 kg/m2 , BMI Body mass index is 25.11 kg/(m^2). GEN: Well nourished, well developed, in no acute distress HEENT: normal Neck: no JVD, carotid bruits, or masses Cardiac: RRR; no murmurs, rubs, or gallops,no edema  Respiratory:  clear to auscultation bilaterally, normal work of breathing GI: soft, nontender, nondistended, + BS MS: no deformity or atrophy Skin: warm and dry, no rash Neuro:  Strength and sensation are intact Psych: euthymic mood, full affect   EKG:   07/04/14  SR with PVC  Rate 71  LAE  ASMI   09/03/14  Atrial flutter rate 110  RSAD    Recent Labs: 07/03/2014: ALT 14 07/04/2014: Hemoglobin 10.6*; Platelets 196 07/06/2014: BUN 6; Creatinine 0.44*; Magnesium 1.7; Potassium 3.5*; Sodium 141    Lipid Panel    Component Value Date/Time   CHOL 132 07/04/2014 0442   TRIG 153* 07/04/2014 0442   HDL 46 07/04/2014 0442   CHOLHDL 2.9 07/04/2014 0442   VLDL 31 07/04/2014 0442   LDLCALC 55 07/04/2014 0442      Wt Readings from Last 3 Encounters:  09/03/14 79.379 kg (175 lb)  07/05/14 85.2 kg (187 lb 13.3 oz)  01/03/13 81.194 kg (179 lb)      Other studies Reviewed: Additional studies/ records that were reviewed today include:  [facility] records and op report .    ASSESSMENT AND PLAN:  1.  Flutter :  Change cardizem to verapamil  Continue beta blocker.  Discussed options at length  Start flecainide 50 bid No CAD at cath.  ETT 10 days  BMET And CBC today  [place] in 3 weeks if doesn't chemically convert.  On Rx Eliquis since December If not successful refer for ablation 2.  MVR:  No residual murmur echo with normal EF post op  SBE prophylaxis no MAZE or LAA exclusion done 3>  CVA  Mild residual right sided weakness likely need life long anticoagulation given MVD, flutter and CVA   Current medicines are reviewed at length with the patient today.   The patient does not have concerns regarding medicines.  The following changes have been made:  D/C cardizem  Add verapamil and flecainide  Labs/ tests ordered today include:  CBC BMET  ETT    No orders of the defined types were placed in this encounter.     Disposition:   [facility] 2/26  Orders written had to call endo and anesthesia to schedule     Signed, Jenkins Rouge, MD  09/03/2014 11:09 AM    [facility] [address], [place], [place]  [address] Phone: [phone]; Fax: [phone]

## 2014-09-21 NOTE — Interval H&P Note (Signed)
History and Physical Interval Note:  09/21/2014 7:55 AM  Colleen Williamson  has presented today for surgery, with the diagnosis of a fib  The various methods of treatment have been discussed with the patient and family. After consideration of risks, benefits and other options for treatment, the patient has consented to  Procedure(s): CARDIOVERSION (N/A) as a surgical intervention .  The patient's history has been reviewed, patient examined, no change in status, stable for surgery.  I have reviewed the patient's chart and labs.  Questions were answered to the patient's satisfaction.     Jenkins Rouge

## 2014-09-24 ENCOUNTER — Encounter (HOSPITAL_COMMUNITY): Payer: Self-pay | Admitting: Cardiovascular Disease

## 2014-09-26 ENCOUNTER — Encounter: Payer: Self-pay | Admitting: Physician Assistant

## 2014-10-16 ENCOUNTER — Encounter: Payer: PPO | Admitting: Physician Assistant

## 2014-11-12 ENCOUNTER — Encounter: Payer: PPO | Admitting: Physician Assistant

## 2014-11-17 NOTE — Discharge Summary (Signed)
PATIENT NAME:  Colleen Williamson, Colleen Williamson MR#:  [id number] DATE OF BIRTH:  09/15/1945  DATE OF ADMISSION:  12/14/2013 DATE OF DISCHARGE:  12/17/2013  HISTORY OF PRESENT ILLNESS: This 69 year old female had a recent screening colonoscopy with findings of a submucosal mass in the cecum. CT scan demonstrated a mass at the same site. Details of past medical history and physical findings are recorded on the typed H and P. It is noted she has had a previous hysterectomy. She did have a bowel preparation at home and came in through the outpatient surgery center prepared for surgery. She was given a preop prophylactic antibiotic. She had a laparoscopic right colectomy with primary anastomosis.   Postoperatively, she was given IV fluids and analgesics as needed. She was begun initially on a liquid diet and gradually advanced. She did demonstrate bowel function prior to discharge. Her wounds progressed satisfactorily.   Pathology demonstrated endosalpingiosis. The 17 lymph nodes were benign.   FINAL DIAGNOSES: Right colonic mass consisting of endosalpingiosis.  PROCEDURE: Laparoscopic right colectomy.   Discharge instructions were given and plans made for followup in the office.  ____________________________ J. Rochel Brome, MD jws:aw D: 01/01/2014 13:14:36 ET T: 01/01/2014 13:29:36 ET JOB#: [id number]  cc: Loreli Dollar, MD, <Dictator> Loreli Dollar MD ELECTRONICALLY SIGNED 01/02/2014 17:26

## 2014-11-17 NOTE — Op Note (Signed)
PATIENT NAME:  Colleen Williamson, Colleen Williamson MR#:  [id number] DATE OF BIRTH:  [birth year]  DATE OF PROCEDURE:  12/14/2013  PREOPERATIVE DIAGNOSIS: Mass of the right colon.   POSTOPERATIVE DIAGNOSIS: Mass of the right colon.   PROCEDURE: Laparoscopic right colectomy.   SURGEON: Rochel Brome, M.D.   ANESTHESIA: General.   INDICATIONS: This 69 year old female recently had screening colonoscopy with findings of a submucosal mass in the cecum adjacent to the ileocecal valve. CT scan also demonstrated a 17 mm mass at this site. Surgery was recommended for further evaluation and treatment. The patient had had previous hysterectomy.   DESCRIPTION OF PROCEDURE: The patient was placed on the operating table in the supine position under general endotracheal anesthesia. The abdomen was prepared with ChloraPrep and draped in a sterile manner.   A short incision was made just below the umbilicus and carried down to the deep fascia, which was grasped with laryngeal hook and elevated. A Veress needle was inserted, aspirated, and irrigated with a saline solution. Next, the peritoneal cavity was inflated with carbon dioxide. The Veress needle was removed. The 10 mm cannula was inserted. The 10 mm, 0 degree laparoscope was inserted to view the peritoneal cavity. The liver appeared normal. The right colon was identified. Another incision was made in the epigastrium to insert an 11 mm cannula. Another incision was made in the right lower quadrant to introduce a 5 mm cannula.  The right colon was identified and was mobilized with incision of the lateral peritoneal reflection using the [facility] Harmonic scalpel for dissection. There were adhesions in the right lower quadrant between the small bowel and the pelvic and abdominal side wall. These adhesions were taken down with use of the Harmonic scalpel and blunt dissection. There appeared to be scarring in the region of the ileocecal valve. Appendix was not seen. The right colon was  further mobilized. Also, the right transverse colon was mobilized using Harmonic scalpel for dissection. The right colon was mobilized so it could be brought over towards the left side demonstrating satisfactory mobility. That completed the laparoscopic part of laparoscopic part of the operation as the laparoscopy instruments were removed.   An incision was made from the infraumbilical port site to the supraumbilical port site, carried down through subcutaneous tissues and incised the midline fascia. Next, the right colon was brought out on the abdominal wall. There was a firm mass near the ileocecal valve. There was scar tissue in this area, which was left intact. A proximal site of margin of resection was selected some 3 inches proximal to the ileocecal valve and made a small opening in the mesentery and the mesenteric dissection was begun with the Harmonic scalpel. Another site in the transverse colon just to the right of the middle colic vessels was also selected and a window made in the mesentery and the mesenteric dissection and also began there with the Harmonic scalpel. A V-shaped portion of mesentery was removed to include the ileocecal artery and vein, which were ligated with 0 chromic and divided with the Harmonic scalpel. The small bowel was brought adjacent to the transverse colon and an enterotomy and a colotomy were made as the intestine was retracted with Allis clamps. The anastomosis was begun along the antimesenteric border with insertion of the GIA 75 stapler, which was engaged and activated. The staple line was hemostatic. The anastomosis was completed with application of the QM-08 stapler, which was placed perpendicular to the first across the small bowel and across the  large bowel, engaged and activated and the specimen was excised sharply with a scalpel and passed off to a side table.  There were several small bleeding points along the staple line, which were cauterized. The mesenteric  defect was closed with a running 5-0 Vicryl. Hemostasis subsequently appeared to be intact. The apex of the anastomosis was reinforced with a 5-0 Vicryl figure-of-eight suture. The specimen was examined on the side table and further demonstrated scarring and there was a palpable mass and some enlargement of regional mesenteric lymph nodes. This was submitted in formalin for routine pathology. Gloves, gown, instruments and the drapes were changed. The wound was further inspected. Hemostasis appeared to be intact. The omentum was brought beneath the wound. The midline fascia was closed with interrupted 0 Maxon figure-of-eight sutures. Skin for both incisions closed with interrupted 5-0.   ____________________________ J. Rochel Brome, MD jws:aw D: 12/14/2013 11:40:05 ET T: 12/14/2013 11:54:45 ET JOB#: [id number]  cc: Loreli Dollar, MD, <Dictator> Loreli Dollar MD ELECTRONICALLY SIGNED 12/18/2013 9:14

## 2014-11-28 ENCOUNTER — Encounter
Admission: EM | Disposition: A | Discharge status: Home / Self Care | Payer: Self-pay | Source: Home / Self Care | Attending: Emergency Medicine

## 2014-11-28 ENCOUNTER — Ambulatory Visit: Admission: RE | Admit: 2014-11-28 | Payer: PPO | Source: Ambulatory Visit | Admitting: Cardiology

## 2014-11-28 ENCOUNTER — Observation Stay: Admit: 2014-11-28 | Payer: Self-pay | Admitting: Cardiology

## 2014-11-28 ENCOUNTER — Other Ambulatory Visit: Payer: Self-pay

## 2014-11-28 ENCOUNTER — Emergency Department
Admission: EM | Admit: 2014-11-28 | Discharge: 2014-11-28 | Disposition: A | Discharge status: Home / Self Care | Payer: PPO | Attending: Emergency Medicine | Admitting: Emergency Medicine

## 2014-11-28 ENCOUNTER — Emergency Department: Payer: PPO | Admitting: Anesthesiology

## 2014-11-28 ENCOUNTER — Encounter: Payer: Self-pay | Admitting: Emergency Medicine

## 2014-11-28 ENCOUNTER — Emergency Department: Payer: PPO

## 2014-11-28 DIAGNOSIS — Z87891 Personal history of nicotine dependence: Secondary | ICD-10-CM | POA: Diagnosis not present

## 2014-11-28 DIAGNOSIS — I4892 Unspecified atrial flutter: Secondary | ICD-10-CM

## 2014-11-28 DIAGNOSIS — R002 Palpitations: Secondary | ICD-10-CM

## 2014-11-28 DIAGNOSIS — R42 Dizziness and giddiness: Secondary | ICD-10-CM

## 2014-11-28 DIAGNOSIS — I214 Non-ST elevation (NSTEMI) myocardial infarction: Secondary | ICD-10-CM | POA: Insufficient documentation

## 2014-11-28 DIAGNOSIS — Z79899 Other long term (current) drug therapy: Secondary | ICD-10-CM | POA: Diagnosis not present

## 2014-11-28 HISTORY — DX: Rheumatic mitral valve disease, unspecified: I05.9

## 2014-11-28 HISTORY — PX: ELECTROPHYSIOLOGIC STUDY: SHX172A

## 2014-11-28 LAB — COMPREHENSIVE METABOLIC PANEL
ALT: 32 U/L (ref 14–54)
ANION GAP: 7 (ref 5–15)
AST: 69 U/L — ABNORMAL HIGH (ref 15–41)
Albumin: 4.2 g/dL (ref 3.5–5.0)
Alkaline Phosphatase: 110 U/L (ref 38–126)
BUN: 25 mg/dL — ABNORMAL HIGH (ref 6–20)
CHLORIDE: 108 mmol/L (ref 101–111)
CO2: 28 mmol/L (ref 22–32)
CREATININE: 0.87 mg/dL (ref 0.44–1.00)
Calcium: 9.4 mg/dL (ref 8.9–10.3)
GFR calc Af Amer: >60 mL/min (ref >60)
GFR calc non Af Amer: >60 mL/min (ref >60)
GLUCOSE: 79 mg/dL (ref 65–99)
Potassium: 4.4 mmol/L (ref 3.5–5.1)
Sodium: 143 mmol/L (ref 135–145)
Total Bilirubin: 2.1 mg/dL — ABNORMAL HIGH (ref 0.3–1.2)
Total Protein: 7.1 g/dL (ref 6.5–8.1)

## 2014-11-28 LAB — CBC WITH DIFFERENTIAL/PLATELET
BASOS PCT: 1 %
Basophils Absolute: 0.1 10*3/uL (ref 0–0.1)
Eosinophils Absolute: 0.1 10*3/uL (ref 0–0.7)
Eosinophils Relative: 1 %
HEMATOCRIT: 48.4 % — AB (ref 35.0–47.0)
Hemoglobin: 15.4 g/dL (ref 12.0–16.0)
Lymphocytes Relative: 10 %
Lymphs Abs: 1.2 10*3/uL (ref 1.0–3.6)
MCH: 26.2 pg (ref 26.0–34.0)
MCHC: 31.9 g/dL — ABNORMAL LOW (ref 32.0–36.0)
MCV: 82.1 fL (ref 80.0–100.0)
MONO ABS: 0.7 10*3/uL (ref 0.2–0.9)
Monocytes Relative: 5 %
NEUTROS ABS: 10.5 10*3/uL — AB (ref 1.4–6.5)
Neutrophils Relative %: 83 %
PLATELETS: 166 10*3/uL (ref 150–440)
RBC: 5.9 MIL/uL — ABNORMAL HIGH (ref 3.80–5.20)
RDW: 16.7 % — AB (ref 11.5–14.5)
WBC: 12.6 10*3/uL — ABNORMAL HIGH (ref 3.6–11.0)

## 2014-11-28 LAB — MAGNESIUM: Magnesium: 2.2 mg/dL (ref 1.7–2.4)

## 2014-11-28 LAB — TROPONIN I: Troponin I: 0.49 ng/mL — ABNORMAL HIGH (ref <0.031)

## 2014-11-28 SURGERY — CARDIOVERSION (CATH LAB)
Anesthesia: General

## 2014-11-28 MED ORDER — SODIUM CHLORIDE 0.9 % IV SOLN
250.0000 mL | INTRAVENOUS | Status: DC
  Administered 2014-11-28: 15:00:00 via INTRAVENOUS

## 2014-11-28 MED ORDER — APIXABAN 5 MG PO TABS
5.0000 mg | ORAL_TABLET | Freq: Two times a day (BID) | ORAL | Status: DC
  Filled 2014-11-28 (×3): qty 1

## 2014-11-28 MED ORDER — ASPIRIN 81 MG PO CHEW
CHEWABLE_TABLET | ORAL | Status: AC
  Administered 2014-11-28: 324 mg via ORAL
  Filled 2014-11-28: qty 4

## 2014-11-28 MED ORDER — FLECAINIDE ACETATE 100 MG PO TABS
100.0000 mg | ORAL_TABLET | Freq: Two times a day (BID) | ORAL | Status: DC
  Filled 2014-11-28 (×3): qty 1

## 2014-11-28 MED ORDER — SODIUM CHLORIDE 0.9 % IV SOLN
INTRAVENOUS | Status: DC
  Administered 2014-11-28: 15:00:00 via INTRAVENOUS

## 2014-11-28 MED ORDER — INSULIN ASPART 100 UNIT/ML ~~LOC~~ SOLN
SUBCUTANEOUS | Status: AC
  Filled 2014-11-28: qty 1

## 2014-11-28 MED ORDER — SODIUM CHLORIDE 0.9 % IJ SOLN: 3.0000 mL | INTRAMUSCULAR | Status: DC | PRN

## 2014-11-28 MED ORDER — METOPROLOL TARTRATE 1 MG/ML IV SOLN
5.0000 mg | Freq: Once | INTRAVENOUS | Status: AC
  Administered 2014-11-28: 5 mg via INTRAVENOUS

## 2014-11-28 MED ORDER — METOPROLOL TARTRATE 1 MG/ML IV SOLN
INTRAVENOUS | Status: AC
  Administered 2014-11-28: 5 mg via INTRAVENOUS
  Filled 2014-11-28: qty 5

## 2014-11-28 MED ORDER — ASPIRIN 81 MG PO CHEW
324.0000 mg | CHEWABLE_TABLET | Freq: Once | ORAL | Status: AC
  Administered 2014-11-28: 324 mg via ORAL

## 2014-11-28 MED ORDER — SODIUM CHLORIDE 0.9 % IJ SOLN: 3.0000 mL | Freq: Two times a day (BID) | INTRAMUSCULAR | Status: DC

## 2014-11-28 MED ORDER — SODIUM CHLORIDE 0.9 % IV BOLUS (SEPSIS)
500.0000 mL | Freq: Once | INTRAVENOUS | Status: AC
  Administered 2014-11-28: 500 mL via INTRAVENOUS

## 2014-11-28 MED ORDER — METOPROLOL TARTRATE 25 MG PO TABS: 12.5000 mg | ORAL_TABLET | Freq: Two times a day (BID) | ORAL | Status: DC

## 2014-11-28 NOTE — Procedures (Signed)
Electrical Cardioversion Procedure Note RAVAN SCHLEMMER [id number] [birth year]  Procedure: Electrical Cardioversion Indications:  Atrial Flutter  Procedure Details Consent: Risks of procedure as well as the alternatives and risks of each were explained to the (patient/caregiver).  Consent for procedure obtained. Time Out: Verified patient identification, verified procedure, site/side was marked, verified correct patient position, special equipment/implants available, medications/allergies/relevent history reviewed, required imaging and test results available.  Performed  Patient placed on cardiac monitor, pulse oximetry, supplemental oxygen as necessary.  Sedation given: propofol Pacer pads placed anterior and posterior chest.  Cardioverted 1 time(s).  Cardioverted at [place].  Evaluation Findings: Post procedure EKG shows: NSR Complications: None Patient did tolerate procedure well.   Tyleigh Mahn A. 11/28/2014, 3:34 PM

## 2014-11-28 NOTE — Anesthesia Preprocedure Evaluation (Addendum)
Anesthesia Evaluation    Airway Mallampati: II       Dental  (+) Teeth Intact   Pulmonary former smoker,  breath sounds clear to auscultation        Cardiovascular + dysrhythmias Atrial Fibrillation + Valvular Problems/Murmurs Rhythm:Irregular     Neuro/Psych CVA    GI/Hepatic   Endo/Other    Renal/GU      Musculoskeletal   Abdominal   Peds  Hematology   Anesthesia Other Findings   Reproductive/Obstetrics                            Anesthesia Physical Anesthesia Plan  ASA: II  Anesthesia Plan: General   Post-op Pain Management:    Induction: Intravenous  Airway Management Planned: Nasal Cannula  Additional Equipment:   Intra-op Plan:   Post-operative Plan:   Informed Consent: I have reviewed the patients History and Physical, chart, labs and discussed the procedure including the risks, benefits and alternatives for the proposed anesthesia with the patient or authorized representative who has indicated his/her understanding and acceptance.     Plan Discussed with:   Anesthesia Plan Comments:         Anesthesia Quick Evaluation

## 2014-11-28 NOTE — ED Notes (Signed)
Pt's family states she is ready to go home and feeling better.  Dr. Edd Fabian notified and spoke with family about elevated troponin and need to contact Dr. Nehemiah Massed and overnight admission.

## 2014-11-28 NOTE — ED Notes (Signed)
Contacted Dr Ubaldo Glassing regarding order for tambacor, eliquis and metoprolol. Patient states that she already took these medicines this AM. Per MD, do not give patient meds if patient has already taken them today. Patient in hall within eyesight of nurses station. Cardiac monitor in place. Respirations even and unlabored. No obvious distress. Family at bedside. Will continue to monitor.

## 2014-11-28 NOTE — Consult Note (Signed)
Cardiology Consultation Note  Patient ID: Colleen Williamson, MRN: [id number], DOB/AGE: 08/02/1945 69 y.o. Admit date: 11/28/2014   Date of Consult: 11/28/2014 Primary Physician: Idelle Crouch, MD Primary Cardiologist: Nehemiah Massed  Chief Complaint: symptomatic atrial flutter Reason for Consult: symptomatic atrial flutter  HPI: 69 y.o. female with h/o has history of intermittant atrial flutter. She is s/p mitral valve replacement and has intermitant atrial flutter since then. She has been on apixiban 5 mg bid and flecanide and beta blockers . She had had recurrant atrial flutter and has been on apixiban for more than 4 weeks. She was placed on metoprolol in addition to her flecanide and apixiban yesterday but feels weak and dizzy and presented ot the er at the request of Dr. Thana Farr. She is in aflutter wiiht rate of 100-120. She has a mild troponin elevation which appears to be seconary to demand ischemia. She is hemodynamically stable.   Past Medical History  Diagnosis Date  . Abnormal mammogram, unspecified 2014  . Breast cyst   . Stroke 07/03/2014  . Mitral valve disorder     mitral valve repair      Most Recent Cardiac Studies: ekg shows atrial flutter with 3:1 block   Surgical History:  Past Surgical History  Procedure Laterality Date  . Abdominal hysterectomy  2004  . Eye surgery  2007  . Mitral valve repair  05/2014  . Radiology with anesthesia N/A 07/03/2014    Procedure: RADIOLOGY WITH ANESTHESIA;  Surgeon: Rob Hickman, MD;  Location: [place];  Service: Radiology;  Laterality: N/A;  . Cardioversion N/A 09/21/2014    Procedure: CARDIOVERSION;  Surgeon: Josue Hector, MD;  Location: [facility] ENDOSCOPY;  Service: Cardiovascular;  Laterality: N/A;     Home Meds: Prior to Admission medications   Medication Sig Start Date End Date Taking? Authorizing Provider  apixaban (ELIQUIS) 5 MG TABS tablet Take 1 tablet (5 mg total) by mouth 2 (two) times daily. 07/09/14  Yes Donzetta Starch,  NP  flecainide (TAMBOCOR) 50 MG tablet Take 1 tablet by mouth 2 (two) times daily. 11/06/14  Yes Historical Provider, MD  metoprolol tartrate (LOPRESSOR) 25 MG tablet Take 25 mg by mouth 2 (two) times daily.   Yes Historical Provider, MD    Inpatient Medications:  . apixaban  5 mg Oral BID  . flecainide  100 mg Oral Q12H  . metoprolol tartrate  12.5 mg Oral BID  . sodium chloride  3 mL Intravenous Q12H   . sodium chloride    . sodium chloride      Allergies:  Allergies  Allergen Reactions  . Sulfa Antibiotics Nausea Only    History   Social History  . Marital Status: Widowed    Spouse Name: N/A  . Number of Children: 3  . Years of Education: 12+   Occupational History  . Not on file.   Social History Main Topics  . Smoking status: Former Research scientist (life sciences)  . Smokeless tobacco: Never Used     Comment: quit smoking in her 30's  . Alcohol Use: No  . Drug Use: No  . Sexual Activity: Not on file   Other Topics Concern  . Not on file   Social History Narrative   Patient is widowed with 3 children.   Patient is right handed.   Patient has hs education plus 1-2 yrs of college.   Patient drinks 1 cup daily.     Family History  Problem Relation Age of Onset  . Cancer  BOTH SIDES OF THE FAMILY  . Diabetes      MATERNAL SIDE OF THE FAMILY     Review of Systems:weakness and fatigue General: negative for chills, fever, night sweats or weight changes.  Cardiovascular: negative for chest pain, edema, orthopnea, paroxysmal nocturnal dyspnea, shortness of breath or dyspnea on exertion. Complains of palpitaitons Dermatological: negative for rash Respiratory: negative for cough or wheezing Urologic: negative for hematuria Abdominal: negative for nausea, vomiting, diarrhea, bright red blood per rectum, melena, or hematemesis Neurologic: negative for visual changes, syncope, or dizziness All other systems reviewed and are otherwise negative except as noted  above.  Labs:  Recent Labs  11/28/14 0950  TROPONINI 0.49*   Lab Results  Component Value Date   WBC 12.6* 11/28/2014   HGB 15.4 11/28/2014   HCT 48.4* 11/28/2014   MCV 82.1 11/28/2014   PLT 166 11/28/2014    Recent Labs Lab 11/28/14 0950  NA 143  K 4.4  CL 108  CO2 28  BUN 25*  CREATININE 0.87  CALCIUM 9.4  PROT 7.1  BILITOT 2.1*  ALKPHOS 110  ALT 32  AST 69*  GLUCOSE 79   Lab Results  Component Value Date   CHOL 132 07/04/2014   HDL 46 07/04/2014   LDLCALC 55 07/04/2014   TRIG 153* 07/04/2014   No results found for: DDIMER  Radiology/Studies:  Dg Chest Portable 1 View  11/28/2014   CLINICAL DATA:  Heart fluttering/ palpitations.  EXAM: PORTABLE CHEST - 1 VIEW  COMPARISON:  07/04/2014  FINDINGS: Sequelae of prior mitral valve repair are again identified. Cardiac silhouette is upper limits of normal in size. Thoracic aortic calcification is noted. The patient has taken a greater inspiration than on the prior study, and there is improved aeration the lung bases. Mild residual/ recurrent right basilar infiltrate is questioned. No definite left lung airspace opacity. No pleural effusion or pneumothorax is identified. No acute osseous abnormality is seen.  IMPRESSION: Improved aeration of the lung bases. Recurrent right basilar infiltrate is questioned.   Electronically Signed   By: Logan Bores   On: 11/28/2014 10:24    EKG: afluttter with variable vr  Weights: Filed Weights   11/28/14 1013  Weight: 76.204 kg (168 lb)     Physical Exam:weakness and fatigue  Blood pressure 121/81, pulse 97, temperature 97.4 F (36.3 C), resp. rate 15, height 5\' 8"  (1.727 m), weight 76.204 kg (168 lb), SpO2 100 %. Body mass index is 25.55 kg/(m^2). General: Well developed, well nourished, in no acute distress. Head: Normocephalic, atraumatic, sclera non-icteric, no xanthomas, nares are without discharge.  Neck: Negative for carotid bruits. JVD not elevated. Lungs: Clear  bilaterally to auscultation without wheezes, rales, or rhonchi. Breathing is unlabored. Heart: Irregular ryhthm with S1 S2. No murmurs, rubs, or gallops appreciated. Abdomen: Soft, non-tender, non-distended with normoactive bowel sounds. No hepatomegaly. No rebound/guarding. No obvious abdominal masses. Msk:  Strength and tone appear normal for age. Extremities: No clubbing or cyanosis. No edema.  Distal pedal pulses are 2+ and equal bilaterally. Neuro: Alert and oriented X 3. No facial asymmetry. No focal deficit. Moves all extremities spontaneously. Psych:  Responds to questions appropriately with a normal affect.    Assessment and Plan: Pt with history of atrial flutter now with recurent atrial flutter with variable vr and symptoms. Has been on apixiban, flecanide and metoprolol. Hemodynamically stable. After long discussion with patinet and multiple family members, they wish having cardioversion today. She last ate at 7:30 this am.  Discussed with anesthesia and they can proceed with moderate sedation this afternoon after 15:30. Risk and benefits of procdure discussed with patient and family. Plan to send home after cardioversion on current meds.   Signed, Javier Docker Cayle Cordoba MD [facility]   11/28/2014, 1:22 PM

## 2014-11-28 NOTE — ED Notes (Signed)
No airway or NG tube in at admission

## 2014-11-28 NOTE — Discharge Summary (Signed)
Physician Discharge Summary  Patient ID: Colleen Williamson MRN: [id number] DOB/AGE: 67-Dec-1947 67 y.o.  Admit date: 11/28/2014 Discharge date: 11/28/2014  Admission Diagnoses:  Discharge Diagnoses:  Active Problems:   * No active hospital problems. *   Discharged Condition: good  Hospital Course: Pt presented with symptomatic atrial flutter and was converted to nsr.  Consults: cardiology  Significant Diagnostic Studies: labs: troponin elevated secondary to demand ischemia  Treatments: IV hydration and procedures: electrical cardioversion  Discharge Exam: Blood pressure 125/92, pulse 102, temperature 97.4 F (36.3 C), resp. rate 16, height 5\' 9"  (1.753 m), weight 76.204 kg (168 lb), SpO2 99 %. General appearance: alert and cooperative Head: Normocephalic, without obvious abnormality, atraumatic Resp: clear to auscultation bilaterally Chest wall: no tenderness Cardio: regular rate and rhythm, S1, S2 normal, no murmur, click, rub or gallop Extremities: extremities normal, atraumatic, no cyanosis or edema Neurologic: Grossly normal  Disposition: 01-Home or Self Care     Medication List    ASK your doctor about these medications        apixaban 5 MG Tabs tablet  Commonly known as:  ELIQUIS  Take 1 tablet (5 mg total) by mouth 2 (two) times daily.     flecainide 50 MG tablet  Commonly known as:  TAMBOCOR  Take 1 tablet by mouth 2 (two) times daily.     metoprolol tartrate 25 MG tablet  Commonly known as:  LOPRESSOR  Take 25 mg by mouth 2 (two) times daily.           Follow-up Information    Follow up with Corey Skains, MD In 1 week.   Specialty:  Internal Medicine   Why:  As needed   Contact information:   [place] [address] [phone]       Signed: Teodoro Spray 11/28/2014, 3:49 PM

## 2014-11-28 NOTE — Discharge Instructions (Signed)
Electrical Cardioversion °Electrical cardioversion is the delivery of a jolt of electricity to change the rhythm of the heart. Sticky patches or metal paddles are placed on the chest to deliver the electricity from a device. This is done to restore a normal rhythm. A rhythm that is too fast or not regular keeps the heart from pumping well. °Electrical cardioversion is done in an emergency if:  °· There is low or no blood pressure as a result of the heart rhythm.   °· Normal rhythm must be restored as fast as possible to protect the brain and heart from further damage.   °· It may save a life. °Cardioversion may be done for heart rhythms that are not immediately life threatening, such as atrial fibrillation or flutter, in which:  °· The heart is beating too fast or is not regular.   °· Medicine to change the rhythm has not worked.   °· It is safe to wait in order to allow time for preparation. °· Symptoms of the abnormal rhythm are bothersome. °· The risk of stroke and other serious problems can be reduced. °LET YOUR HEALTH CARE PROVIDER KNOW ABOUT:  °· Any allergies you have. °· All medicines you are taking, including vitamins, herbs, eye drops, creams, and over-the-counter medicines. °· Previous problems you or members of your family have had with the use of anesthetics.   °· Any blood disorders you have.   °· Previous surgeries you have had.   °· Medical conditions you have. °RISKS AND COMPLICATIONS  °Generally, this is a safe procedure. However, problems can occur and include:  °· Breathing problems related to the anesthetic used. °· A blood clot that breaks free and travels to other parts of your body. This could cause a stroke or other problems. The risk of this is lowered by use of blood-thinning medicine (anticoagulant) prior to the procedure. °· Cardiac arrest (rare). °BEFORE THE PROCEDURE  °· You may have tests to detect blood clots in your heart and to evaluate heart function.  °· You may start taking  anticoagulants so your blood does not clot as easily.   °· Medicines may be given to help stabilize your heart rate and rhythm. °PROCEDURE °· You will be given medicine through an IV tube to reduce discomfort and make you sleepy (sedative).   °· An electrical shock will be delivered. °AFTER THE PROCEDURE °Your heart rhythm will be watched to make sure it does not change.  °Document Released: 07/03/2002 Document Revised: 11/27/2013 Document Reviewed: 01/25/2013 °ExitCare® Patient Information ©2015 [employer]. This information is not intended to replace advice given to you by your health care provider. Make sure you discuss any questions you have with your health care provider. ° °

## 2014-11-28 NOTE — ED Provider Notes (Signed)
[facility] Emergency Department Provider Note    ____________________________________________  Time seen: 09:30   I have reviewed the triage vital signs and the nursing notes.   HISTORY  Chief Complaint Atrial Flutter       HPI Colleen Williamson is a 69 y.o. female with history of atrial flutter, history of prior CVA, history of mitral valve replacement on elliquis who presents for evaluation of sensation of heart racing and heart fluttering. Patient reports that every morning for the past 4 days she has awoken in the morning with a sensation of heart fast heart racing. Gradual onset She was seen by her cardiologist yesterday, Dr. Nehemiah Massed, who started her on metoprolol in addition to her flecainide however that has not seemed to help her symptoms. She reports typically they resolve by midday. She reports the fluttering sensation has resolved at this time. She denies any associated chest pain, trouble breathing, sudden sweating. This morning she did feel lightheaded but that is the only associated symptom. No modifying factors. Current severity: fluttering is 0/gone.  Location: cardiovascular     Past Medical History  Diagnosis Date  . Abnormal mammogram, unspecified 2014  . Breast cyst   . Stroke 07/03/2014  . Mitral valve disorder     mitral valve repair    Patient Active Problem List   Diagnosis Date Noted  . 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 S/P MVR (mitral valve repair) 09/20/2014  . CVA (cerebral infarction) 07/03/2014  . Atrial fibrillation 07/03/2014  . Stroke   . Breast cyst 01/03/2013    Past Surgical History  Procedure Laterality Date  . Abdominal hysterectomy  2004  . Eye surgery  2007  . Mitral valve repair  05/2014  . Radiology with anesthesia N/A 07/03/2014    Procedure: RADIOLOGY WITH ANESTHESIA;  Surgeon: Rob Hickman, MD;  Location: [place];  Service: Radiology;   Laterality: N/A;  . Cardioversion N/A 09/21/2014    Procedure: CARDIOVERSION;  Surgeon: Josue Hector, MD;  Location: [facility] ENDOSCOPY;  Service: Cardiovascular;  Laterality: N/A;    Current Outpatient Rx  Name  Route  Sig  Dispense  Refill  . apixaban (ELIQUIS) 5 MG TABS tablet   Oral   Take 1 tablet (5 mg total) by mouth 2 (two) times daily.   60 tablet   2   . flecainide (TAMBOCOR) 50 MG tablet   Oral   Take 1 tablet by mouth 2 (two) times daily.      9   . metoprolol tartrate (LOPRESSOR) 25 MG tablet   Oral   Take 25 mg by mouth 2 (two) times daily.           Allergies Sulfa antibiotics  Family History  Problem Relation Age of Onset  . Cancer      BOTH SIDES OF THE FAMILY  . Diabetes      MATERNAL SIDE OF THE FAMILY    Social History History  Substance Use Topics  . Smoking status: Former Research scientist (life sciences)  . Smokeless tobacco: Never Used     Comment: quit smoking in her 30's  . Alcohol Use: No    Review of Systems  Constitutional: Negative for fever. Eyes: Negative for visual changes. ENT: Negative for sore throat. Cardiovascular: Negative for chest pain. Respiratory: Negative for shortness of breath. Gastrointestinal: Negative for abdominal pain, vomiting and diarrhea. Genitourinary: Negative for dysuria. Musculoskeletal: Negative for back pain. Skin: Negative for rash. Neurological: Negative  for headaches, focal weakness or numbness.   10-point ROS otherwise negative.  ____________________________________________   Danley Danker Vitals:   11/28/14 1013 11/28/14 1015 11/28/14 1030 11/28/14 1045  BP:  108/74 115/77 116/77  Pulse:  103 96 99  Temp:      Resp:  20 21 14   Height: 5\' 8"  (1.727 m)     Weight: 168 lb (76.204 kg)     SpO2:  96% 95% 98%   PHYSICAL EXAM:    Constitutional: Alert and oriented. Well appearing and in no distress. Eyes: Conjunctivae are normal. PERRL. Normal extraocular movements. ENT   Head: Normocephalic and atraumatic.    Nose: No congestion/rhinnorhea.   Mouth/Throat: Mucous membranes are moist.   Neck: No stridor. Hematological/Lymphatic/Immunilogical: No cervical lymphadenopathy. Cardiovascular: tachycardic with regular rhythm. Normal and symmetric distal pulses are present in all extremities. No murmurs, rubs, or gallops. Respiratory: Normal respiratory effort without tachypnea nor retractions. Breath sounds are clear and equal bilaterally. No wheezes/rales/rhonchi. Gastrointestinal: Soft and nontender. No distention. No abdominal bruits. There is no CVA tenderness. Genitourinary: deferred Musculoskeletal: Nontender with normal range of motion in all extremities. No joint effusions.  No lower extremity tenderness nor edema. Neurologic:  Normal speech and language. No gross focal neurologic deficits are appreciated. Speech is normal. No gait instability. Skin:  Skin is warm, dry and intact. No rash noted. Psychiatric: Mood and affect are normal. Speech and behavior are normal. Patient exhibits appropriate insight and judgment.  ____________________________________________    LABS (pertinent positives/negatives)  Notable for elevated troponin at 0.49  ____________________________________________   EKG  ED ECG REPORT   Date: 11/28/2014  EKG Time: 09:47  Rate: 105  Rhythm: atrial flutter with 2:1 AV conduction  Axis: normal  Intervals:none  ST&T Change: ST depression in 2, lead 3, bili aVF, V4, V5, V6   ____________________________________________    RADIOLOGY  CXR: IMPRESSION: Improved aeration of the lung bases. Recurrent right basilar infiltrate is questioned.   ____________________________________________   PROCEDURES  Procedure(s) performed: None  Critical Care performed: Yes, see critical care note(s)  ____________________________________________   INITIAL IMPRESSION / ASSESSMENT AND PLAN / ED COURSE  Pertinent labs & imaging results that were available during  my care of the patient were reviewed by me and considered in my medical decision making (see chart for details).   Colleen Williamson is a 69 y.o. female with history of atrial flutter, history of prior CVA, history of mitral valve replacement on elliquis who presents for evaluation of sensation of heart racing and heart fluttering. On exam she is generally well-appearing and in no acute distress. Heart mildly tachycardic with regular rhythm. Maintaining blood pressure. Plan metoprolol, screening labs, chest x-ray.  ----------------------------------------- 12:03 PM on 11/28/2014 -----------------------------------------  Heart rate improved to high 90s after metoprolol. She feels much better at this point. Troponin elevated at 0.49 question demand ischemia versus NSTEMI and in the setting of mild ST depression on EKG, discussed with Dr. Ubaldo Glassing as well as hospitalist for admission. She denies fevers, or cough so will not treat for pneumonia at this time though chest x-ray questions recurrent right basilar infiltrate. D/W Hospitalist for admission.  ____________________________________________   FINAL CLINICAL IMPRESSION(S) / ED DIAGNOSES  Final diagnoses:  Atrial flutter, unspecified  Lightheadedness  Heart palpitations  NSTEMI (non-ST elevation myocardial infarction)     Joanne Gavel, MD 11/28/14 1208

## 2014-11-28 NOTE — ED Notes (Signed)
Pt arrived to ED via EMS for continued heart flutter. Was seen by her PCP yesterday and started on Metoprolol. Today with activity heart flutter continued despite the metoprolol. She is currently on Eliquis and flecanide

## 2014-11-28 NOTE — ED Notes (Signed)
Troponin 0.49 result, notified Dr. Edd Fabian.

## 2014-11-29 NOTE — Anesthesia Postprocedure Evaluation (Signed)
  Anesthesia Post-op Note  Patient: Colleen Williamson  Procedure(s) Performed: Procedure(s): Cardioversion (N/A)  Anesthesia type:General  Patient location: PACU  Post pain: Pain level controlled  Post assessment: Post-op Vital signs reviewed, Patient's Cardiovascular Status Stable, Respiratory Function Stable, Patent Airway and No signs of Nausea or vomiting  Post vital signs: Reviewed and stable  Last Vitals:  Filed Vitals:   11/28/14 1545  BP: 115/79  Pulse: 67  Temp:   Resp: 21    Level of consciousness: awake, alert  and patient cooperative  Complications: No apparent anesthesia complications

## 2014-11-29 NOTE — Transfer of Care (Signed)
Immediate Anesthesia Transfer of Care Note  Patient: Colleen Williamson  Procedure(s) Performed: Procedure(s): Cardioversion (N/A)  Patient Location: PACU and Cath Lab  Anesthesia Type:MAC  Level of Consciousness: awake, alert  and oriented  Airway & Oxygen Therapy: Patient Spontanous Breathing  Post-op Assessment: Report given to RN  Post vital signs: Reviewed and stable  Last Vitals: There were no vitals filed for this visit.  Complications: No apparent anesthesia complications

## 2014-12-05 NOTE — H&P (Signed)
Cardiology Consultation Note  Patient ID: Colleen Williamson, MRN: [id number], DOB/AGE: November 19, 1945 69 y.o. Admit date: 5/4/2016Date of Consult: 11/28/2014 Primary Physician: Idelle Crouch, MD Primary Cardiologist: Nehemiah Massed  Chief Complaint: symptomatic atrial flutter Reason for Consult: symptomatic atrial flutter  HPI: 69 y.o. female with h/o has history of intermittant atrial flutter. She is s/p mitral valve replacement and has intermitant atrial flutter since then. She has been on apixiban 5 mg bid and flecanide and beta blockers . She had had recurrant atrial flutter and has been on apixiban for more than 4 weeks. She was placed on metoprolol in addition to her flecanide and apixiban yesterday but feels weak and dizzy and presented ot the er at the request of Dr. Thana Farr. She is in aflutter wiiht rate of 100-120. She has a mild troponin elevation which appears to be seconary to demand ischemia. She is hemodynamically stable.   Past Medical History  Diagnosis Date  . Abnormal mammogram, unspecified 2014  . Breast cyst   . Stroke 07/03/2014  . Mitral valve disorder     mitral valve repair     Most Recent Cardiac Studies: ekg shows atrial flutter with 3:1 block   Surgical History:  Past Surgical History  Procedure Laterality Date  . Abdominal hysterectomy  2004  . Eye surgery  2007  . Mitral valve repair  05/2014  . Radiology with anesthesia N/A 07/03/2014    Procedure: RADIOLOGY WITH ANESTHESIA; Surgeon: Rob Hickman, MD; Location: [facility]; Service: Radiology; Laterality: N/A;  . Cardioversion N/A 09/21/2014    Procedure: CARDIOVERSION; Surgeon: Josue Hector, MD; Location: [facility] ENDOSCOPY; Service: Cardiovascular; Laterality: N/A;     Home Meds: Prior to Admission medications   Medication Sig Start Date End Date Taking? Authorizing Provider  apixaban (ELIQUIS) 5 MG TABS tablet  Take 1 tablet (5 mg total) by mouth 2 (two) times daily. 07/09/14  Yes Donzetta Starch, NP  flecainide (TAMBOCOR) 50 MG tablet Take 1 tablet by mouth 2 (two) times daily. 11/06/14  Yes Historical Provider, MD  metoprolol tartrate (LOPRESSOR) 25 MG tablet Take 25 mg by mouth 2 (two) times daily.   Yes Historical Provider, MD    Inpatient Medications:  . apixaban 5 mg Oral BID  . flecainide 100 mg Oral Q12H  . metoprolol tartrate 12.5 mg Oral BID  . sodium chloride 3 mL Intravenous Q12H   . sodium chloride   . sodium chloride     Allergies:  Allergies  Allergen Reactions  . Sulfa Antibiotics Nausea Only    History   Social History  . Marital Status: Widowed    Spouse Name: N/A  . Number of Children: 3  . Years of Education: 12+   Occupational History  . Not on file.   Social History Main Topics  . Smoking status: Former Research scientist (life sciences)  . Smokeless tobacco: Never Used     Comment: quit smoking in her 30's  . Alcohol Use: No  . Drug Use: No  . Sexual Activity: Not on file   Other Topics Concern  . Not on file   Social History Narrative   Patient is widowed with 3 children.   Patient is right handed.   Patient has hs education plus 1-2 yrs of college.   Patient drinks 1 cup daily.     Family History  Problem Relation Age of Onset  . Cancer      BOTH SIDES OF THE FAMILY  . Diabetes      MATERNAL  SIDE OF THE FAMILY     Review of Systems:weakness and fatigue General: negative for chills, fever, night sweats or weight changes.  Cardiovascular: negative for chest pain, edema, orthopnea, paroxysmal nocturnal dyspnea, shortness of breath or dyspnea on exertion. Complains of palpitaitons Dermatological: negative for rash Respiratory: negative for cough or wheezing Urologic: negative for hematuria Abdominal: negative for nausea, vomiting,  diarrhea, bright red blood per rectum, melena, or hematemesis Neurologic: negative for visual changes, syncope, or dizziness All other systems reviewed and are otherwise negative except as noted above.  Labs:  Recent Labs (last 2 labs)      Recent Labs  11/28/14 0950  TROPONINI 0.49*     Lab Results  Component Value Date   WBC 12.6* 11/28/2014   HGB 15.4 11/28/2014   HCT 48.4* 11/28/2014   MCV 82.1 11/28/2014   PLT 166 11/28/2014    Recent Labs Lab 11/28/14 0950  NA 143  K 4.4  CL 108  CO2 28  BUN 25*  CREATININE 0.87  CALCIUM 9.4  PROT 7.1  BILITOT 2.1*  ALKPHOS 110  ALT 32  AST 69*  GLUCOSE 79    Recent Labs    Lab Results  Component Value Date   CHOL 132 07/04/2014   HDL 46 07/04/2014   LDLCALC 55 07/04/2014   TRIG 153* 07/04/2014      Recent Labs    No results found for: DDIMER    Radiology/Studies:   Imaging Results    Dg Chest Portable 1 View  11/28/2014 CLINICAL DATA: Heart fluttering/ palpitations. EXAM: PORTABLE CHEST - 1 VIEW COMPARISON: 07/04/2014 FINDINGS: Sequelae of prior mitral valve repair are again identified. Cardiac silhouette is upper limits of normal in size. Thoracic aortic calcification is noted. The patient has taken a greater inspiration than on the prior study, and there is improved aeration the lung bases. Mild residual/ recurrent right basilar infiltrate is questioned. No definite left lung airspace opacity. No pleural effusion or pneumothorax is identified. No acute osseous abnormality is seen. IMPRESSION: Improved aeration of the lung bases. Recurrent right basilar infiltrate is questioned. Electronically Signed By: Logan Bores On: 11/28/2014 10:24     EKG: afluttter with variable vr  Weights: Filed Weights   11/28/14 1013  Weight: 76.204 kg (168 lb)     Physical Exam:weakness and fatigue  Blood pressure 121/81, pulse 97,  temperature 97.4 F (36.3 C), resp. rate 15, height 5\' 8"  (1.727 m), weight 76.204 kg (168 lb), SpO2 100 %. Body mass index is 25.55 kg/(m^2). General: Well developed, well nourished, in no acute distress. Head: Normocephalic, atraumatic, sclera non-icteric, no xanthomas, nares are without discharge. Neck: Negative for carotid bruits. JVD not elevated. Lungs: Clear bilaterally to auscultation without wheezes, rales, or rhonchi. Breathing is unlabored. Heart: Irregular ryhthm with S1 S2. No murmurs, rubs, or gallops appreciated. Abdomen: Soft, non-tender, non-distended with normoactive bowel sounds. No hepatomegaly. No rebound/guarding. No obvious abdominal masses. Msk: Strength and tone appear normal for age. Extremities: No clubbing or cyanosis. No edema. Distal pedal pulses are 2+ and equal bilaterally. Neuro: Alert and oriented X 3. No facial asymmetry. No focal deficit. Moves all extremities spontaneously. Psych: Responds to questions appropriately with a normal affect.    Assessment and Plan: Pt with history of atrial flutter now with recurent atrial flutter with variable vr and symptoms. Has been on apixiban, flecanide and metoprolol. Hemodynamically stable. After long discussion with patinet and multiple family members, they wish having cardioversion today. She last ate at 7:30  this am. Discussed with anesthesia and they can proceed with moderate sedation this afternoon after 15:30. Risk and benefits of procdure discussed with patient and family. Plan to send home after cardioversion on current meds.   Signed, Javier Docker Fath MD [facility]  11/28/2014, 1:22 PM       Routing History     Date/Time From To Method   11/28/2014 1:30 PM Teodoro Spray, MD Idelle Crouch, MD In [facility]

## 2014-12-13 ENCOUNTER — Encounter: Payer: Self-pay | Admitting: *Deleted

## 2014-12-13 ENCOUNTER — Encounter: Payer: Self-pay | Admitting: Internal Medicine

## 2014-12-13 ENCOUNTER — Ambulatory Visit (INDEPENDENT_AMBULATORY_CARE_PROVIDER_SITE_OTHER): Payer: PPO | Admitting: Internal Medicine

## 2014-12-13 VITALS — BP 118/82 | HR 48 | Ht 69.0 in | Wt 165.2 lb

## 2014-12-13 DIAGNOSIS — I483 Typical atrial flutter: Secondary | ICD-10-CM

## 2014-12-13 DIAGNOSIS — R5383 Other fatigue: Secondary | ICD-10-CM | POA: Insufficient documentation

## 2014-12-13 DIAGNOSIS — I639 Cerebral infarction, unspecified: Secondary | ICD-10-CM

## 2014-12-13 DIAGNOSIS — I4892 Unspecified atrial flutter: Secondary | ICD-10-CM | POA: Diagnosis not present

## 2014-12-13 DIAGNOSIS — Z9889 Other specified postprocedural states: Secondary | ICD-10-CM

## 2014-12-13 DIAGNOSIS — I484 Atypical atrial flutter: Secondary | ICD-10-CM

## 2014-12-13 DIAGNOSIS — I480 Paroxysmal atrial fibrillation: Secondary | ICD-10-CM | POA: Diagnosis not present

## 2014-12-13 NOTE — Assessment & Plan Note (Signed)
She has had 2 episodes of typical atrial flutter requiring DCCV, despite medical therapy. I have discussed the treatment options with the patient and her sister and the risks/benefits/goals/expectations of cathter ablation have been reviewed and the patient would like to proceed with ablation. I would anticipate stopping her beta blocker after ablation but she would continue her flecainide at least for 2-3 months after the procedure.

## 2014-12-13 NOTE — Assessment & Plan Note (Signed)
She has minimal residual symptoms at present today. She will continue her current meds.

## 2014-12-13 NOTE — Assessment & Plan Note (Signed)
She has had chronic fatigue and sob since her valve surgery for unclear reasons. I have recommended she undergo 2D echo.

## 2014-12-13 NOTE — Assessment & Plan Note (Signed)
She had post op atrial fib and a stroke. She will remain on systemic anti-coagulation even after a successful ablation.

## 2014-12-13 NOTE — Patient Instructions (Addendum)
Medication Instructions:  Your physician recommends that you continue on your current medications as directed. Please refer to the Current Medication list given to you today.  Labwork: None ordered  Testing/Procedures: Your physician has requested that you have an echocardiogram. Echocardiography is a painless test that uses sound waves to create images of your heart. It provides your doctor with information about the size and shape of your heart and how well your heart's chambers and valves are working. This procedure takes approximately one hour. There are no restrictions for this procedure.  Your physician has recommended that you have an ablation. Catheter ablation is a medical procedure used to treat some cardiac arrhythmias (irregular heartbeats). During catheter ablation, a long, thin, flexible tube is put into a blood vessel in your groin (upper thigh), or neck. This tube is called an ablation catheter. It is then guided to your heart through the blood vessel. Radio frequency waves destroy small areas of heart tissue where abnormal heartbeats may cause an arrhythmia to start. Please see the instruction sheet given to you today.  Follow-Up: Follow up will scheduled after abalation  Thank you for choosing [facility]!!       Cardiac Ablation Cardiac ablation is a procedure to disable a small amount of heart tissue in very specific places. The heart has many electrical connections. Sometimes these connections are abnormal and can cause the heart to beat very fast or irregularly. By disabling some of the problem areas, heart rhythm can be improved or made normal. Ablation is done for people who:   Have Wolff-Parkinson-White syndrome.   Have other fast heart rhythms (tachycardia).   Have taken medicines for an abnormal heart rhythm (arrhythmia) that resulted in:   No success.   Side effects.   May have a high-risk heartbeat that could result in death.  LET [facility] CARE PROVIDER KNOW ABOUT:   Any allergies you have or any previous reactions you have had to X-ray dye, food (such as seafood), medicine, or tape.   All medicines you are taking, including vitamins, herbs, eye drops, creams, and over-the-counter medicines.   Previous problems you or members of your family have had with the use of anesthetics.   Any blood disorders you have.   Previous surgeries or procedures (such as a kidney transplant) you have had.   Medical conditions you have (such as kidney failure).  RISKS AND COMPLICATIONS Generally, cardiac ablation is a safe procedure. However, problems can occur and include:   Increased risk of cancer. Depending on how long it takes to do the ablation, the dose of radiation can be high.  Bruising and bleeding where a thin, flexible tube (catheter) was inserted during the procedure.   Bleeding into the chest, especially into the sac that surrounds the heart (serious).  Need for a permanent pacemaker if the normal electrical system is damaged.   The procedure may not be fully effective, and this may not be recognized for months. Repeat ablation procedures are sometimes required. BEFORE THE PROCEDURE   Follow any instructions from your health care provider regarding eating and drinking before the procedure.   Take your medicines as directed at regular times with water, unless instructed otherwise by your health care provider. If you are taking diabetes medicine, including insulin, ask how you are to take it and if there are any special instructions you should follow. It is common to adjust insulin dosing the day of the ablation.  PROCEDURE  An ablation is usually  performed in a catheterization laboratory with the guidance of fluoroscopy. Fluoroscopy is a type of X-ray that helps your health care provider see images of your heart during the procedure.   An ablation is a minimally invasive procedure. This means a small cut  (incision) is made in either your neck or groin. Your health care provider will decide where to make the incision based on your medical history and physical exam.  An IV tube will be started before the procedure begins. You will be given an anesthetic or medicine to help you relax (sedative).  The skin on your neck or groin will be numbed. A needle will be inserted into a large vein in your neck or groin and catheters will be threaded to your heart.  A special dye that shows up on fluoroscopy pictures may be injected through the catheter. The dye helps your health care provider see the area of the heart that needs treatment.  The catheter has electrodes on the tip. When the area of heart tissue that is causing the arrhythmia is found, the catheter tip will send an electrical current to the area and "scar" the tissue. Three types of energy can be used to ablate the heart tissue:   Heat (radiofrequency energy).   Laser energy.   Extreme cold (cryoablation).   When the area of the heart has been ablated, the catheter will be taken out. Pressure will be held on the insertion site. This will help the insertion site clot and keep it from bleeding. A bandage will be placed on the insertion site.  AFTER THE PROCEDURE   After the procedure, you will be taken to a recovery area where your vital signs (blood pressure, heart rate, and breathing) will be monitored. The insertion site will also be monitored for bleeding.   You will need to lie still for 4-6 hours. This is to ensure you do not bleed from the catheter insertion site.  Document Released: 11/29/2008 Document Revised: 11/27/2013 Document Reviewed: 12/05/2012 [facility] Patient Information 2015 [place], [place]. This information is not intended to replace advice given to you by your health care provider. Make sure you discuss any questions you have with your health care provider.

## 2014-12-13 NOTE — Progress Notes (Signed)
HPI Colleen Williamson is referred today for evaluation of atrial flutter. She is a pleasant 69 yo woman with mitral valve disease who underwent mitral valve repair several months ago. She had post op atrial fib by her report and sustained a stroke. She was found to be in atrial flutter 3 months ago and underwent DCCV. She is on systemic anti-coagulation with Eliquis. She represented with recurrent atrial flutter and underwent a second cardioversion 2 weeks ago and is referred to consider cathter ablation. She was on Flecainide at the time of her second episode of flutter. The patient has never had syncope but she notes that ever since her mitral valve repair, she has felt weak and washed out, even when she is not out of rhythm. She notes sob with exertion. She has had a slow pulse.  Allergies  Allergen Reactions  . Sulfa Antibiotics Nausea Only     Current Outpatient Prescriptions  Medication Sig Dispense Refill  . apixaban (ELIQUIS) 5 MG TABS tablet Take 1 tablet (5 mg total) by mouth 2 (two) times daily. 60 tablet 2  . flecainide (TAMBOCOR) 50 MG tablet Take 1 tablet by mouth 2 (two) times daily.  9  . metoprolol tartrate (LOPRESSOR) 25 MG tablet Take 25 mg by mouth 2 (two) times daily.     No current facility-administered medications for this visit.     Past Medical History  Diagnosis Date  . Abnormal mammogram, unspecified 2014  . Breast cyst   . Stroke 07/03/2014  . Mitral valve disorder     mitral valve repair    ROS:   All systems reviewed and negative except as noted in the HPI.   Past Surgical History  Procedure Laterality Date  . Abdominal hysterectomy  2004  . Eye surgery  2007  . Mitral valve repair  05/2014  . Radiology with anesthesia N/A 07/03/2014    Procedure: RADIOLOGY WITH ANESTHESIA;  Surgeon: Rob Hickman, MD;  Location: [facility];  Service: Radiology;  Laterality: N/A;  . Cardioversion N/A 09/21/2014    Procedure: CARDIOVERSION;  Surgeon: Josue Hector, MD;  Location[facility] ENDOSCOPY;  Service: Cardiovascular;  Laterality: N/A;     Family History  Problem Relation Age of Onset  . Cancer      BOTH SIDES OF THE FAMILY  . Diabetes      MATERNAL SIDE OF THE FAMILY  . Cancer Father      History   Social History  . Marital Status: Widowed    Spouse Name: N/A  . Number of Children: 3  . Years of Education: 12+   Occupational History  . Not on file.   Social History Main Topics  . Smoking status: Former Research scientist (life sciences)  . Smokeless tobacco: Never Used     Comment: quit smoking in her 30's  . Alcohol Use: No  . Drug Use: No  . Sexual Activity: Not on file   Other Topics Concern  . Not on file   Social History Narrative   Patient is widowed with 3 children.   Patient is right handed.   Patient has hs education plus 1-2 yrs of college.   Patient drinks 1 cup daily.     BP 118/82 mmHg  Pulse 48  Ht 5\' 9"  (1.753 m)  Wt 165 lb 3.2 oz (74.934 kg)  BMI 24.38 kg/m2  Physical Exam:  Well appearing 69 yo woman, NAD HEENT: Unremarkable Neck:  No JVD, no thyromegally Lymphatics:  No  adenopathy Back:  No CVA tenderness Lungs:  Clear with no wheezes HEART:  Regular rate rhythm, no murmurs, no rubs, no clicks Abd:  soft, positive bowel sounds, no organomegally, no rebound, no guarding Ext:  2 plus pulses, no edema, no cyanosis, no clubbing Skin:  No rashes no nodules Neuro:  CN II through XII intact, motor grossly intact  EKG - sinus bradycardia   Assess/Plan:

## 2014-12-17 ENCOUNTER — Ambulatory Visit (HOSPITAL_COMMUNITY): Payer: PPO | Attending: Cardiovascular Disease

## 2014-12-17 ENCOUNTER — Telehealth: Payer: Self-pay | Admitting: Internal Medicine

## 2014-12-17 ENCOUNTER — Other Ambulatory Visit: Payer: Self-pay

## 2014-12-17 DIAGNOSIS — I4892 Unspecified atrial flutter: Secondary | ICD-10-CM | POA: Insufficient documentation

## 2014-12-17 DIAGNOSIS — Z9889 Other specified postprocedural states: Secondary | ICD-10-CM

## 2014-12-17 NOTE — Telephone Encounter (Signed)
New Message  Pt is scheduled to have the Ablation on 6/10. Pt states that the rep that completed the ECHO mentioned that the ablation may not be needed at this time being that she is now in rhythm. Pt is now re-considering having the ablation Completed and finds herself confused about the entire process. Please call back to clarify.

## 2014-12-17 NOTE — Telephone Encounter (Signed)
Spoke with patient and let her know she was in NSR when here to see Dr Lovena Le.  She does still need the ablation and is going to proceed

## 2014-12-21 ENCOUNTER — Encounter (HOSPITAL_COMMUNITY): Payer: Self-pay | Admitting: Pharmacy Technician

## 2014-12-26 ENCOUNTER — Ambulatory Visit: Payer: PPO | Admitting: Neurology

## 2014-12-26 ENCOUNTER — Encounter: Payer: Self-pay | Admitting: Cardiology

## 2014-12-27 ENCOUNTER — Ambulatory Visit (HOSPITAL_COMMUNITY)
Admission: RE | Admit: 2014-12-27 | Discharge: 2014-12-28 | Disposition: A | Discharge status: Home / Self Care | Payer: PPO | Source: Ambulatory Visit | Attending: Internal Medicine | Admitting: Internal Medicine

## 2014-12-27 ENCOUNTER — Encounter (HOSPITAL_COMMUNITY)
Admission: RE | Disposition: A | Discharge status: Home / Self Care | Payer: PPO | Source: Ambulatory Visit | Attending: Internal Medicine

## 2014-12-27 DIAGNOSIS — Z87891 Personal history of nicotine dependence: Secondary | ICD-10-CM | POA: Insufficient documentation

## 2014-12-27 DIAGNOSIS — I484 Atypical atrial flutter: Secondary | ICD-10-CM

## 2014-12-27 DIAGNOSIS — Z882 Allergy status to sulfonamides status: Secondary | ICD-10-CM | POA: Insufficient documentation

## 2014-12-27 DIAGNOSIS — Z90710 Acquired absence of both cervix and uterus: Secondary | ICD-10-CM | POA: Diagnosis not present

## 2014-12-27 DIAGNOSIS — Z8673 Personal history of transient ischemic attack (TIA), and cerebral infarction without residual deficits: Secondary | ICD-10-CM | POA: Diagnosis not present

## 2014-12-27 DIAGNOSIS — I4892 Unspecified atrial flutter: Secondary | ICD-10-CM | POA: Diagnosis not present

## 2014-12-27 DIAGNOSIS — I483 Typical atrial flutter: Secondary | ICD-10-CM | POA: Diagnosis not present

## 2014-12-27 DIAGNOSIS — Z809 Family history of malignant neoplasm, unspecified: Secondary | ICD-10-CM | POA: Diagnosis not present

## 2014-12-27 DIAGNOSIS — I480 Paroxysmal atrial fibrillation: Secondary | ICD-10-CM | POA: Diagnosis present

## 2014-12-27 HISTORY — PX: ELECTROPHYSIOLOGIC STUDY: SHX172A

## 2014-12-27 LAB — BASIC METABOLIC PANEL
Anion gap: 8 (ref 5–15)
BUN: 13 mg/dL (ref 6–20)
CO2: 28 mmol/L (ref 22–32)
CREATININE: 0.72 mg/dL (ref 0.44–1.00)
Calcium: 9.5 mg/dL (ref 8.9–10.3)
Chloride: 105 mmol/L (ref 101–111)
Glucose, Bld: 89 mg/dL (ref 65–99)
Potassium: 4.4 mmol/L (ref 3.5–5.1)
SODIUM: 141 mmol/L (ref 135–145)

## 2014-12-27 LAB — CBC
HEMATOCRIT: 45.8 % (ref 36.0–46.0)
Hemoglobin: 14.9 g/dL (ref 12.0–15.0)
MCH: 27.2 pg (ref 26.0–34.0)
MCHC: 32.5 g/dL (ref 30.0–36.0)
MCV: 83.6 fL (ref 78.0–100.0)
Platelets: 205 10*3/uL (ref 150–400)
RBC: 5.48 MIL/uL — ABNORMAL HIGH (ref 3.87–5.11)
RDW: 15.3 % (ref 11.5–15.5)
WBC: 5 10*3/uL (ref 4.0–10.5)

## 2014-12-27 SURGERY — A-FLUTTER/A-TACH/SVT ABLATION

## 2014-12-27 MED ORDER — APIXABAN 5 MG PO TABS
5.0000 mg | ORAL_TABLET | Freq: Two times a day (BID) | ORAL | Status: DC
  Administered 2014-12-27 – 2014-12-28 (×2): 5 mg via ORAL
  Filled 2014-12-27 (×3): qty 1

## 2014-12-27 MED ORDER — BUPIVACAINE HCL (PF) 0.25 % IJ SOLN
INTRAMUSCULAR | Status: DC | PRN
  Administered 2014-12-27: 20 mL

## 2014-12-27 MED ORDER — BUPIVACAINE HCL (PF) 0.25 % IJ SOLN
INTRAMUSCULAR | Status: AC
  Filled 2014-12-27: qty 60

## 2014-12-27 MED ORDER — FENTANYL CITRATE (PF) 100 MCG/2ML IJ SOLN
INTRAMUSCULAR | Status: AC
  Filled 2014-12-27: qty 2

## 2014-12-27 MED ORDER — FENTANYL CITRATE (PF) 100 MCG/2ML IJ SOLN
INTRAMUSCULAR | Status: DC | PRN
  Administered 2014-12-27 (×2): 12.5 ug via INTRAVENOUS
  Administered 2014-12-27: 25 ug via INTRAVENOUS
  Administered 2014-12-27 (×3): 12.5 ug via INTRAVENOUS
  Administered 2014-12-27: 25 ug via INTRAVENOUS

## 2014-12-27 MED ORDER — ACETAMINOPHEN 325 MG PO TABS: 650.0000 mg | ORAL_TABLET | ORAL | Status: DC | PRN

## 2014-12-27 MED ORDER — MIDAZOLAM HCL 5 MG/5ML IJ SOLN
INTRAMUSCULAR | Status: AC
  Filled 2014-12-27: qty 5

## 2014-12-27 MED ORDER — SODIUM CHLORIDE 0.9 % IJ SOLN
3.0000 mL | Freq: Two times a day (BID) | INTRAMUSCULAR | Status: DC
  Administered 2014-12-27: 3 mL via INTRAVENOUS

## 2014-12-27 MED ORDER — ONDANSETRON HCL 4 MG/2ML IJ SOLN: 4.0000 mg | Freq: Four times a day (QID) | INTRAMUSCULAR | Status: DC | PRN

## 2014-12-27 MED ORDER — FLECAINIDE ACETATE 50 MG PO TABS
50.0000 mg | ORAL_TABLET | Freq: Two times a day (BID) | ORAL | Status: DC
  Administered 2014-12-27 – 2014-12-28 (×2): 50 mg via ORAL
  Filled 2014-12-27 (×3): qty 1

## 2014-12-27 MED ORDER — SODIUM CHLORIDE 0.9 % IV SOLN: 250.0000 mL | INTRAVENOUS | Status: DC | PRN

## 2014-12-27 MED ORDER — SODIUM CHLORIDE 0.9 % IJ SOLN: 3.0000 mL | INTRAMUSCULAR | Status: DC | PRN

## 2014-12-27 MED ORDER — MIDAZOLAM HCL 5 MG/5ML IJ SOLN
INTRAMUSCULAR | Status: DC | PRN
  Administered 2014-12-27: 1 mg via INTRAVENOUS
  Administered 2014-12-27: 2 mg via INTRAVENOUS
  Administered 2014-12-27 (×2): 1 mg via INTRAVENOUS
  Administered 2014-12-27: 2 mg via INTRAVENOUS
  Administered 2014-12-27 (×2): 1 mg via INTRAVENOUS

## 2014-12-27 MED ORDER — HEPARIN (PORCINE) IN NACL 2-0.9 UNIT/ML-% IJ SOLN
INTRAMUSCULAR | Status: AC
  Filled 2014-12-27: qty 500

## 2014-12-27 SURGICAL SUPPLY — 10 items
BAG SNAP BAND KOVER 36X36 (MISCELLANEOUS) ×3 IMPLANT
CATH CELSIUS FLTR 8MM (ABLATOR) ×3 IMPLANT
CATH JOSEPHSON QUAD-ALLRED 6FR (CATHETERS) ×3 IMPLANT
CATH POLARIS X 2.5/5/2.5 DECAP (CATHETERS) ×3 IMPLANT
PACK EP LATEX FREE (CUSTOM PROCEDURE TRAY) ×2
PACK EP LF (CUSTOM PROCEDURE TRAY) ×1 IMPLANT
PAD DEFIB LIFELINK (PAD) ×3 IMPLANT
SHEATH PINNACLE 6F 10CM (SHEATH) ×6 IMPLANT
SHEATH PINNACLE 8F 10CM (SHEATH) ×3 IMPLANT
SHIELD RADPAD SCOOP 12X17 (MISCELLANEOUS) ×3 IMPLANT

## 2014-12-27 NOTE — Progress Notes (Signed)
Site area: rt groin, 3 fv sheaths Site Prior to Removal:  Level 0 Pressure Applied For:  15 minutes Manual:   yes Patient Status During Pull:  Stable  Post Pull Site:  Level  0 Post Pull Instructions Given:  yes Post Pull Pulses Present: yes Dressing Applied:  Small tegaderm Bedrest begins @ 1800 Comments:  none

## 2014-12-27 NOTE — Progress Notes (Signed)
Received pt from cath lab, report given by Ronalee Belts. Explained to pt to remain on bedrest for 6 hours post op. Right groin incision assessed and documented. Pt made aware of call light and is within reach. Family is at bedside. No complications or concerns.   Colleen Williamson Therapist, sports

## 2014-12-27 NOTE — H&P (View-Only) (Signed)
HPI Colleen Williamson is referred today for evaluation of atrial flutter. She is a pleasant 69 yo woman with mitral valve disease who underwent mitral valve repair several months ago. She had post op atrial fib by her report and sustained a stroke. She was found to be in atrial flutter 3 months ago and underwent DCCV. She is on systemic anti-coagulation with Eliquis. She represented with recurrent atrial flutter and underwent a second cardioversion 2 weeks ago and is referred to consider cathter ablation. She was on Flecainide at the time of her second episode of flutter. The patient has never had syncope but she notes that ever since her mitral valve repair, she has felt weak and washed out, even when she is not out of rhythm. She notes sob with exertion. She has had a slow pulse.  Allergies  Allergen Reactions  . Sulfa Antibiotics Nausea Only     Current Outpatient Prescriptions  Medication Sig Dispense Refill  . apixaban (ELIQUIS) 5 MG TABS tablet Take 1 tablet (5 mg total) by mouth 2 (two) times daily. 60 tablet 2  . flecainide (TAMBOCOR) 50 MG tablet Take 1 tablet by mouth 2 (two) times daily.  9  . metoprolol tartrate (LOPRESSOR) 25 MG tablet Take 25 mg by mouth 2 (two) times daily.     No current facility-administered medications for this visit.     Past Medical History  Diagnosis Date  . Abnormal mammogram, unspecified 2014  . Breast cyst   . Stroke 07/03/2014  . Mitral valve disorder     mitral valve repair    ROS:   All systems reviewed and negative except as noted in the HPI.   Past Surgical History  Procedure Laterality Date  . Abdominal hysterectomy  2004  . Eye surgery  2007  . Mitral valve repair  05/2014  . Radiology with anesthesia N/A 07/03/2014    Procedure: RADIOLOGY WITH ANESTHESIA;  Surgeon: Rob Hickman, MD;  Location: [place];  Service: Radiology;  Laterality: N/A;  . Cardioversion N/A 09/21/2014    Procedure: CARDIOVERSION;  Surgeon: Josue Hector, MD;  Location: [facility] ENDOSCOPY;  Service: Cardiovascular;  Laterality: N/A;     Family History  Problem Relation Age of Onset  . Cancer      BOTH SIDES OF THE FAMILY  . Diabetes      MATERNAL SIDE OF THE FAMILY  . Cancer Father      History   Social History  . Marital Status: Widowed    Spouse Name: N/A  . Number of Children: 3  . Years of Education: 12+   Occupational History  . Not on file.   Social History Main Topics  . Smoking status: Former Research scientist (life sciences)  . Smokeless tobacco: Never Used     Comment: quit smoking in her 30's  . Alcohol Use: No  . Drug Use: No  . Sexual Activity: Not on file   Other Topics Concern  . Not on file   Social History Narrative   Patient is widowed with 3 children.   Patient is right handed.   Patient has hs education plus 1-2 yrs of college.   Patient drinks 1 cup daily.     BP 118/82 mmHg  Pulse 48  Ht 5\' 9"  (1.753 m)  Wt 165 lb 3.2 oz (74.934 kg)  BMI 24.38 kg/m2  Physical Exam:  Well appearing 69 yo woman, NAD HEENT: Unremarkable Neck:  No JVD, no thyromegally Lymphatics:  No  adenopathy Back:  No CVA tenderness Lungs:  Clear with no wheezes HEART:  Regular rate rhythm, no murmurs, no rubs, no clicks Abd:  soft, positive bowel sounds, no organomegally, no rebound, no guarding Ext:  2 plus pulses, no edema, no cyanosis, no clubbing Skin:  No rashes no nodules Neuro:  CN II through XII intact, motor grossly intact  EKG - sinus bradycardia   Assess/Plan:

## 2014-12-27 NOTE — Interval H&P Note (Signed)
History and Physical Interval Note:  12/27/2014 4:07 PM  Colleen Williamson  has presented today for surgery, with the diagnosis of aflutter  The various methods of treatment have been discussed with the patient and family. After consideration of risks, benefits and other options for treatment, the patient has consented to  Procedure(s): A-Flutter (N/A) as a surgical intervention .  The patient's history has been reviewed, patient examined, no change in status, stable for surgery.  I have reviewed the patient's chart and labs.  Questions were answered to the patient's satisfaction.     Cristopher Peru

## 2014-12-28 ENCOUNTER — Encounter (HOSPITAL_COMMUNITY): Payer: Self-pay | Admitting: Internal Medicine

## 2014-12-28 ENCOUNTER — Telehealth: Payer: Self-pay | Admitting: Internal Medicine

## 2014-12-28 DIAGNOSIS — Z90710 Acquired absence of both cervix and uterus: Secondary | ICD-10-CM | POA: Diagnosis not present

## 2014-12-28 DIAGNOSIS — Z882 Allergy status to sulfonamides status: Secondary | ICD-10-CM | POA: Diagnosis not present

## 2014-12-28 DIAGNOSIS — Z8673 Personal history of transient ischemic attack (TIA), and cerebral infarction without residual deficits: Secondary | ICD-10-CM | POA: Diagnosis not present

## 2014-12-28 DIAGNOSIS — I4892 Unspecified atrial flutter: Secondary | ICD-10-CM | POA: Diagnosis not present

## 2014-12-28 DIAGNOSIS — I483 Typical atrial flutter: Secondary | ICD-10-CM

## 2014-12-28 MED FILL — Heparin Sodium (Porcine) 2 Unit/ML in Sodium Chloride 0.9%: INTRAMUSCULAR | Qty: 500 | Status: AC

## 2014-12-28 NOTE — Telephone Encounter (Signed)
New message     Patient calling S/p procedure with Dr.Taylor - Ablation has questions regarding dressing changes.

## 2014-12-28 NOTE — Progress Notes (Signed)
12/28/2014 1000 Discharge AVS meds taken today and those due this evening reviewed.  Follow-up appointments and when to call md reviewed.  D/C IV and TELE.  Questions and concerns addressed.   D/C home per orders. [place]

## 2014-12-28 NOTE — Discharge Summary (Signed)
Physician Discharge Summary     Cardiologist:  Nishan/Devone Bonilla  Patient ID: Colleen Williamson MRN: [id number] DOB/AGE: [birth year] 69 y.o.  Admit date: 12/27/2014 Discharge date: 12/28/2014  Admission Diagnoses:   Atrial Flutter  Discharge Diagnoses:  Active Problems:   Atrial flutter   SP remote MV repair   Remote CVA  Discharged Condition: stable  Hospital Course:   Colleen Williamson is referred for evaluation of atrial flutter. She is a pleasant 69 yo woman with mitral valve disease who underwent mitral valve repair several months ago. She had post op atrial fib by her report and sustained a stroke. She was found to be in atrial flutter 3 months ago and underwent DCCV. She is on systemic anti-coagulation with Eliquis. She represented with recurrent atrial flutter and underwent a second cardioversion 2 weeks ago and is referred to consider cathter ablation. She was on Flecainide at the time of her second episode of flutter. The patient has never had syncope but she notes that ever since her mitral valve repair, she has felt weak and washed out, even when she is not out of rhythm. She notes sob with exertion. She has had a slow pulse.   She was admitted for comprehensive EP study and catheter ablation which was completed successfully.  We will continue flecainide and eliquis.  The patient was seen by Dr. Lovena Le who felt she was stable for DC home.     Consults: None  Significant Diagnostic Studies:  PREPROCEDURE DIAGNOSIS: Typical Atrial flutter.   POSTPROCEDURE DIAGNOSIS: Typical Atrial flutter.   PROCEDURES:  1. Comprehensive electrophysiology study.  2. Coronary sinus pacing and recording.  3. Mapping of atrial flutter.  5. Ablation of atrial flutter.  6. Arrhythmia induction with pacing.   INTRODUCTION: Colleen Williamson is a 69 y.o. female with symptomatic typical atrial flutter. She presents today for EP study and radiofrequency ablation.   DESCRIPTION OF PROCEDURE: Informed  written consent was obtained, and the patient was brought to the Electrophysiology Lab in the fasting state. He was sedated with IV Versed and Fentanyl. The patient's right groin and neck was prepped and draped in the usual sterile fashion by the EP lab staff. Using a modified Seldinger technique, one 8, and two 6-French hemostasis sheaths were placed in the right common femoral vein. A 6-French quadripolar Josephson catheter was introduced through the right common femoral vein and advanced into the right ventricle for recording and pacing, but this catheter was then pulled back to the His bundle location.   Initial Measurements: The patient presented to the electrophysiology lab in atrial flutter. The surface electrocardiogram was consistent with typical atrial flutter. The atrial flutter cycle length was 300 milliseconds. The coronary sinus catheter activation revealed proximal to distal activation and was therefore suggestive of right atrial flutter. The patient's QRS duration was 113 milliseconds with a QT interval of 475 milliseconds and an HV interval of 44 milliseconds.   Entrainment and Mapping: Entrainment was performed from the left atrium, which revealed a long postpacing interval. A Emergency planning/management officer II XB 8-mm ablation catheter was introduced through the right common femoral vein and advanced into the right atrium. The catheter was positioned along the cavotricuspid isthmus. Entrainment mapping was performed from the cavotricuspid isthmus. The postpacing interval was equal to the tachycardia cycle length when pacing in this location during entrainment. The patient was therefore felt to have isthmus-dependent right atrial flutter. Mapping was performed along the atrial side of the cavotricuspid isthmus. This demonstrated a moderate-sized  isthmus.   Ablation: The ablation catheter was therefore positioned along the cavotricuspid isthmus and a series of radiofrequency  applications were delivered with a target temperature of 60 degrees of 50 watts for 120 seconds each. The tachycardia slowed and then terminated during radiofrequency application. Additional mapping of the atrial signal was performed with additional ablation performed. Following bonus radiofrequency applications, complete bidirectional isthmus block was achieved as evident by differential atrial pacing from the low lateral right atrium. The patient was observed for 20 minutes without return of conduction through the cavotricuspid isthmus.   Measurements Post ablation: Following ablation, the stimulus to earliest atrial activation recorded across the isthmus line measured 160 milliseconds. The AH interval measured 86 milliseconds with an HV interval of 44 milliseconds. Atrial pacing was performed, which revealed decremental AV conduction with no evidence of PR greater than RR. The AV Wenckebach cycle length was 340 milliseconds. Atrial pacing was continued down to a cycle length of 240 milliseconds with no arrhythmias induced. Ventricular pacing was performed, which revealed midline decremental VA conduction with a retrograde VA block of 500 milliseconds. No arrhythmias were induced. The procedure was therefore considered completed. All catheters were removed and the sheaths were aspirated and flushed. The sheaths were removed and hemostasis was assured. There were no early apparent complications.    CONCLUSIONS:  1. Isthmus-dependent right atrial flutter upon presentation.  2. Successful radiofrequency ablation of atrial flutter along the cavotricuspid isthmus with complete bidirectional isthmus block achieved.  3. No inducible arrhythmias following ablation.  4. No early apparent complications.   Cristopher Peru, MD   Treatments:  See above  Discharge Exam: Blood pressure 107/54, pulse 62, temperature 98.5 F (36.9 C), temperature source Oral, resp. rate 17, height 5\' 9"  (1.753 m),  weight 165 lb (74.844 kg), SpO2 97 %.   Disposition: 01-Home or Self Care      Discharge Instructions    Diet - low sodium heart healthy    Complete by:  As directed      Increase activity slowly    Complete by:  As directed             Medication List    TAKE these medications        apixaban 5 MG Tabs tablet  Commonly known as:  ELIQUIS  Take 1 tablet (5 mg total) by mouth 2 (two) times daily.     flecainide 50 MG tablet  Commonly known as:  TAMBOCOR  Take 1 tablet by mouth 2 (two) times daily.     naproxen sodium 220 MG tablet  Commonly known as:  ANAPROX  Take 220 mg by mouth as needed (for pain).       Follow-up Information    Follow up with Cristopher Peru, MD On 03/19/2015.   Specialty:  Cardiology   Why:  3:00 PM   Contact information:   [address] [place] [address] [phone]      Greater than 30 minutes was spent completing the patient's discharge.    SignedTarri Fuller, [place] 12/28/2014, 9:17 AM  EP Attending  Patient seen and examined. Agree with above.   Mikle Bosworth.D.

## 2014-12-28 NOTE — Discharge Instructions (Signed)
Information on my medicine - ELIQUIS (apixaban)  This medication education was reviewed with me or my healthcare representative as part of my discharge preparation.  The pharmacist that spoke with me during my hospital stay was:  Euline Kimbler, [place], [place]  Why was Eliquis prescribed for you? Eliquis was prescribed for you to reduce the risk of forming blood clots that can cause a stroke if you have a medical condition called atrial fibrillation (a type of irregular heartbeat) OR to reduce the risk of a blood clots forming after orthopedic surgery.  What do You need to know about Eliquis ? Take your Eliquis TWICE DAILY - one tablet in the morning and one tablet in the evening with or without food.  It would be best to take the doses about the same time each day.  If you have difficulty swallowing the tablet whole please discuss with your pharmacist how to take the medication safely.  Take Eliquis exactly as prescribed by your doctor and DO NOT stop taking Eliquis without talking to the doctor who prescribed the medication.  Stopping may increase your risk of developing a new clot or stroke.  Refill your prescription before you run out.  After discharge, you should have regular check-up appointments with your healthcare provider that is prescribing your Eliquis.  In the future your dose may need to be changed if your kidney function or weight changes by a significant amount or as you get older.  What do you do if you miss a dose? If you miss a dose, take it as soon as you remember on the same day and resume taking twice daily.  Do not take more than one dose of ELIQUIS at the same time.  Important Safety Information A possible side effect of Eliquis is bleeding. You should call your healthcare provider right away if you experience any of the following: ? Bleeding from an injury or your nose that does not stop. ? Unusual colored urine (red or dark brown) or unusual colored stools (red or  black). ? Unusual bruising for unknown reasons. ? A serious fall or if you hit your head (even if there is no bleeding).  Some medicines may interact with Eliquis and might increase your risk of bleeding or clotting while on Eliquis. To help avoid this, consult your healthcare provider or pharmacist prior to using any new prescription or non-prescription medications, including herbals, vitamins, non-steroidal anti-inflammatory drugs (NSAIDs) and supplements.  This website has more information on Eliquis (apixaban): [email or web address].no.

## 2014-12-28 NOTE — Progress Notes (Signed)
Patient ID: [name], female   DOB: 1945/12/08, 69 y.o.   MRN: [id number]    Patient Name: Colleen Williamson Date of Encounter: 12/28/2014     Active Problems:   Atrial flutter    SUBJECTIVE  No chest pain, sob or palpitations  CURRENT MEDS . apixaban  5 mg Oral BID  . flecainide  50 mg Oral BID  . sodium chloride  3 mL Intravenous Q12H    OBJECTIVE  Filed Vitals:   12/28/14 0000 12/28/14 0030 12/28/14 0100 12/28/14 0638  BP: 97/53 100/55 96/54 107/54  Pulse: 68 70 65 62  Temp:    98.5 F (36.9 C)  TempSrc:    Oral  Resp:    17  Height:      Weight:      SpO2:    97%    Intake/Output Summary (Last 24 hours) at 12/28/14 0749 Last data filed at 12/27/14 2151  Gross per 24 hour  Intake      3 ml  Output      0 ml  Net      3 ml   Filed Weights   12/27/14 1135  Weight: 165 lb (74.844 kg)    PHYSICAL EXAM  General: Pleasant, NAD. Neuro: Alert and oriented X 3. Moves all extremities spontaneously. Psych: Normal affect. HEENT:  Normal  Neck: Supple without bruits or JVD. Lungs:  Resp regular and unlabored, CTA. Heart: RRR no s3, s4, or murmurs. Abdomen: Soft, non-tender, non-distended, BS + x 4.  Extremities: No clubbing, cyanosis or edema. DP/PT/Radials 2+ and equal bilaterally.  Accessory Clinical Findings  CBC  Recent Labs  12/27/14 1201  WBC 5.0  HGB 14.9  HCT 45.8  MCV 83.6  PLT 563   Basic Metabolic Panel  Recent Labs  12/27/14 1201  NA 141  K 4.4  CL 105  CO2 28  GLUCOSE 89  BUN 13  CREATININE 0.72  CALCIUM 9.5   Liver Function Tests No results for input(s): AST, ALT, ALKPHOS, BILITOT, PROT, ALBUMIN in the last 72 hours. No results for input(s): LIPASE, AMYLASE in the last 72 hours. Cardiac Enzymes No results for input(s): CKTOTAL, CKMB, CKMBINDEX, TROPONINI in the last 72 hours. BNP Invalid input(s): POCBNP D-Dimer No results for input(s): DDIMER in the last 72 hours. Hemoglobin A1C No results for input(s): HGBA1C  in the last 72 hours. Fasting Lipid Panel No results for input(s): CHOL, HDL, LDLCALC, TRIG, CHOLHDL, LDLDIRECT in the last 72 hours. Thyroid Function Tests No results for input(s): TSH, T4TOTAL, T3FREE, THYROIDAB in the last 72 hours.  Invalid input(s): FREET3  TELE  nsr  Radiology/Studies  Dg Chest Portable 1 View  11/28/2014   CLINICAL DATA:  Heart fluttering/ palpitations.  EXAM: PORTABLE CHEST - 1 VIEW  COMPARISON:  07/04/2014  FINDINGS: Sequelae of prior mitral valve repair are again identified. Cardiac silhouette is upper limits of normal in size. Thoracic aortic calcification is noted. The patient has taken a greater inspiration than on the prior study, and there is improved aeration the lung bases. Mild residual/ recurrent right basilar infiltrate is questioned. No definite left lung airspace opacity. No pleural effusion or pneumothorax is identified. No acute osseous abnormality is seen.  IMPRESSION: Improved aeration of the lung bases. Recurrent right basilar infiltrate is questioned.   Electronically Signed   By: Logan Bores   On: 11/28/2014 10:24    ASSESSMENT AND PLAN  1. Atrial flutter, s/p catheter ablation 2. S/p remote mitral valve repair  3. Remote stroke Rec: ok for discharge home. Continue her home meds including flecainide and Eliquis. When I see her back in the office, will consider stopping flecainide. With h/o stroke due to post op atrial fib, I am not sure we will be able to stop Eliquis.  Gregg Taylor,M.D.  12/28/2014 7:49 AM

## 2015-01-03 NOTE — Progress Notes (Signed)
Her CHADSVASC score is 2. GT

## 2015-01-03 NOTE — Telephone Encounter (Signed)
Left another message for patient to return my call.

## 2015-01-04 NOTE — Telephone Encounter (Signed)
Called the patient again and left her another message.  I told her I was going to close this phone note out but if she still needed me to call and I would be glad to assist her.

## 2015-03-19 ENCOUNTER — Encounter: Payer: Self-pay | Admitting: Internal Medicine

## 2015-03-19 ENCOUNTER — Ambulatory Visit (INDEPENDENT_AMBULATORY_CARE_PROVIDER_SITE_OTHER): Payer: PPO | Admitting: Internal Medicine

## 2015-03-19 ENCOUNTER — Other Ambulatory Visit: Payer: Self-pay

## 2015-03-19 VITALS — BP 126/88 | HR 71 | Ht 69.0 in | Wt 155.0 lb

## 2015-03-19 DIAGNOSIS — I4892 Unspecified atrial flutter: Secondary | ICD-10-CM

## 2015-03-19 DIAGNOSIS — I480 Paroxysmal atrial fibrillation: Secondary | ICD-10-CM | POA: Diagnosis not present

## 2015-03-19 NOTE — Assessment & Plan Note (Signed)
She has done well since her ablation and appears to be maintaining NSR. She will stop her flecainide. With her prior history including a stroke after surgery, I would recommend she continue Eliquis.

## 2015-03-19 NOTE — Progress Notes (Signed)
HPI [name] returns today after undergoing cathter ablation of atrial flutter. She is a pleasant 69 yo woman with mitral valve disease who underwent mitral valve repair several months ago. She had post op atrial fib by her report and sustained a stroke. She had recurrent atrial flutter and several DC cardioversions. She underwent catheter ablation of typical atrial flutter several weeks ago. She has done well in the interim. No symptomatic arrhythmias.  Allergies  Allergen Reactions  . Sulfa Antibiotics Nausea Only     Current Outpatient Prescriptions  Medication Sig Dispense Refill  . apixaban (ELIQUIS) 5 MG TABS tablet Take 1 tablet (5 mg total) by mouth 2 (two) times daily. 60 tablet 2  . naproxen sodium (ANAPROX) 220 MG tablet Take 220 mg by mouth as needed (for pain).     No current facility-administered medications for this visit.     Past Medical History  Diagnosis Date  . Abnormal mammogram, unspecified 2014  . Breast cyst   . Stroke 07/03/2014  . Mitral valve disorder     mitral valve repair    ROS:   All systems reviewed and negative except as noted in the HPI.   Past Surgical History  Procedure Laterality Date  . Abdominal hysterectomy  2004  . Eye surgery  2007  . Mitral valve repair  05/2014  . Radiology with anesthesia N/A 07/03/2014    Procedure: RADIOLOGY WITH ANESTHESIA;  Surgeon: Rob Hickman, MD;  Location: [facility];  Service: Radiology;  Laterality: N/A;  . Cardioversion N/A 09/21/2014    Procedure: CARDIOVERSION;  Surgeon: Josue Hector, MD;  Location: [facility] ENDOSCOPY;  Service: Cardiovascular;  Laterality: N/A;  . Electrophysiologic study N/A 11/28/2014    Procedure: Cardioversion;  Surgeon: Teodoro Spray, MD;  Location: [place] [facility];  Service: Cardiovascular;  Laterality: N/A;  . Electrophysiologic study N/A 12/27/2014    Procedure: A-Flutter;  Surgeon: Evans Lance, MD;  Location: [place] [facility];  Service: Cardiovascular;   Laterality: N/A;     Family History  Problem Relation Age of Onset  . Cancer      BOTH SIDES OF THE FAMILY  . Diabetes      MATERNAL SIDE OF THE FAMILY  . Cancer Father      Social History   Social History  . Marital Status: Widowed    Spouse Name: N/A  . Number of Children: 3  . Years of Education: 12+   Occupational History  . Not on file.   Social History Main Topics  . Smoking status: Former Research scientist (life sciences)  . Smokeless tobacco: Never Used     Comment: quit smoking in her 30's  . Alcohol Use: No  . Drug Use: No  . Sexual Activity: Not on file   Other Topics Concern  . Not on file   Social History Narrative   Patient is widowed with 3 children.   Patient is right handed.   Patient has hs education plus 1-2 yrs of college.   Patient drinks 1 cup daily.     BP 126/88 mmHg  Pulse 71  Ht 5\' 9"  (1.753 m)  Wt 155 lb (70.308 kg)  BMI 22.88 kg/m2  Physical Exam:  Well appearing 69 yo woman, NAD HEENT: Unremarkable Neck:  6 cm JVD, no thyromegally Back:  No CVA tenderness Lungs:  Clear with no wheezes HEART:  Regular rate rhythm, no murmurs, no rubs, no clicks Abd:  soft, positive bowel sounds, no  organomegally, no rebound, no guarding Ext:  2 plus pulses, no edema, no cyanosis, no clubbing Skin:  No rashes no nodules Neuro:  CN II through XII intact, motor grossly intact  EKG - sinus bradycardia with IRBBB   Assess/Plan:

## 2015-03-19 NOTE — Patient Instructions (Signed)
Medication Instructions:  Your physician has recommended you make the following change in your medication: 1) Stop Flecainide    Labwork: None ordered  Testing/Procedures: None ordered  Follow-Up: Your physician recommends that you schedule a follow-up appointment as scheduled with Dr Rockey Situ and as needed with Dr Lovena Le   Any Other Special Instructions Will Be Listed Below (If Applicable).

## 2015-03-19 NOTE — Assessment & Plan Note (Signed)
She has some risk of developing atrial fib. She has had a metallic taste with flecainide and we will stop this medication. If she developed recurrent atrial fib then we might consider rhythmol or sotalol.

## 2015-04-05 ENCOUNTER — Encounter: Payer: Self-pay | Admitting: Cardiology

## 2015-04-23 ENCOUNTER — Institutional Professional Consult (permissible substitution): Payer: PPO | Admitting: Cardiovascular Disease

## 2015-04-30 ENCOUNTER — Ambulatory Visit (INDEPENDENT_AMBULATORY_CARE_PROVIDER_SITE_OTHER): Payer: PPO | Admitting: Cardiovascular Disease

## 2015-04-30 ENCOUNTER — Encounter: Payer: Self-pay | Admitting: Cardiovascular Disease

## 2015-04-30 VITALS — BP 134/82 | HR 48 | Ht 69.0 in | Wt 149.8 lb

## 2015-04-30 DIAGNOSIS — R5383 Other fatigue: Secondary | ICD-10-CM | POA: Diagnosis not present

## 2015-04-30 DIAGNOSIS — I10 Essential (primary) hypertension: Secondary | ICD-10-CM | POA: Insufficient documentation

## 2015-04-30 DIAGNOSIS — I499 Cardiac arrhythmia, unspecified: Secondary | ICD-10-CM

## 2015-04-30 DIAGNOSIS — Z954 Presence of other heart-valve replacement: Secondary | ICD-10-CM

## 2015-04-30 DIAGNOSIS — R5382 Chronic fatigue, unspecified: Secondary | ICD-10-CM

## 2015-04-30 DIAGNOSIS — R531 Weakness: Secondary | ICD-10-CM

## 2015-04-30 DIAGNOSIS — I4892 Unspecified atrial flutter: Secondary | ICD-10-CM

## 2015-04-30 DIAGNOSIS — Z9889 Other specified postprocedural states: Secondary | ICD-10-CM

## 2015-04-30 DIAGNOSIS — I480 Paroxysmal atrial fibrillation: Secondary | ICD-10-CM

## 2015-04-30 DIAGNOSIS — Z952 Presence of prosthetic heart valve: Secondary | ICD-10-CM

## 2015-04-30 DIAGNOSIS — I6310 Cerebral infarction due to embolism of unspecified precerebral artery: Secondary | ICD-10-CM

## 2015-04-30 NOTE — Assessment & Plan Note (Signed)
Mitral valve repair at [facility]. She's done well since that time. Follow-up echocardiogram May 2016 with well-functioning valve, no gradient

## 2015-04-30 NOTE — Addendum Note (Signed)
Addended by: Minna Merritts on: 04/30/2015 06:15 PM   Modules accepted: Level of Service

## 2015-04-30 NOTE — Assessment & Plan Note (Signed)
Postoperative atrial fibrillation following her mitral valve repair No further episodes documented

## 2015-04-30 NOTE — Assessment & Plan Note (Signed)
Significant fatigue, malaise over the past several months since her mitral valve replacement surgery. Leg weakness, mild shortness of breath, overall weakness Likely with underlying depression as well, insomnia is not helping Recently started on sertraline

## 2015-04-30 NOTE — Progress Notes (Signed)
Patient ID: Colleen Williamson, female    DOB: October 08, 1945, 69 y.o.   MRN: [id number]  HPI Comments: Colleen Williamson is a very pleasant 69 year old woman with history of mitral valve prolapse, repair earlier in 2016 at [facility], postoperative atrial fibrillation, stroke, found to be in atrial flutter over the summer underwent DCCV on , anti-coagulation with Eliquis, recurrent atrial flutter while on flecainide with second cardioversion 2 weeks ago, who underwent  cathter ablation in [place]. She presents to establish care in the [place] office  In follow-up today, she denies any significant symptoms concerning for atrial flutter or atrial fibrillation. She's not sleeping well, significant anxiety, insomnia. She has lost 40 pounds over the past year. Anorexia. Unable to exert herself but tremendously weak, mild shortness of breath, leg weakness.  Recent seen by primary care and started on sertraline as well as bisoprolol HCTZ for elevated diastolic pressures of 914. She reports that she's never had elevated blood pressures before.  On today's visit, she has underlying sinus bradycardia with rate 48 bpm EKG on today's visit confirms normal sinus rhythm/sinus bradycardia with no significant ST or T-wave changes she reports that she is at her wits end given her stress, weakness, inability to do anything        Allergies  Allergen Reactions  . Sulfa Antibiotics Nausea Only    Current Outpatient Prescriptions on File Prior to Visit  Medication Sig Dispense Refill  . apixaban (ELIQUIS) 5 MG TABS tablet Take 1 tablet (5 mg total) by mouth 2 (two) times daily. 60 tablet 2  . naproxen sodium (ANAPROX) 220 MG tablet Take 220 mg by mouth as needed (for pain).     No current facility-administered medications on file prior to visit.    Past Medical History  Diagnosis Date  . Abnormal mammogram, unspecified 2014  . Breast cyst   . Stroke ([place]) 07/03/2014  . Mitral valve disorder      mitral valve repair    Past Surgical History  Procedure Laterality Date  . Abdominal hysterectomy  2004  . Eye surgery  2007  . Mitral valve repair  05/2014  . Radiology with anesthesia N/A 07/03/2014    Procedure: RADIOLOGY WITH ANESTHESIA;  Surgeon: Rob Hickman, MD;  Location: [facility];  Service: Radiology;  Laterality: N/A;  . Cardioversion N/A 09/21/2014    Procedure: CARDIOVERSION;  Surgeon: Josue Hector, MD;  Location[facility] ENDOSCOPY;  Service: Cardiovascular;  Laterality: N/A;  . Electrophysiologic study N/A 11/28/2014    Procedure: Cardioversion;  Surgeon: Teodoro Spray, MD;  Location: [place] [facility];  Service: Cardiovascular;  Laterality: N/A;  . Electrophysiologic study N/A 12/27/2014    Procedure: A-Flutter;  Surgeon: Evans Lance, MD;  Location: [place] [facility];  Service: Cardiovascular;  Laterality: N/A;  . Electrophysiologic study N/A 11/28/2014    Procedure: Cardioversion;  Surgeon: Teodoro Spray, MD;  Location: [facility] ORS;  Service: Cardiovascular;  Laterality: N/A;  Have discussed with Dr. Marcello Moores    Social History  reports that she has quit smoking. She has never used smokeless tobacco. She reports that she does not drink alcohol or use illicit drugs.  Family History family history includes Cancer in her father and another family member; Diabetes in an other family member.    Review of Systems  Constitutional: Positive for appetite change, fatigue and unexpected weight change.  Respiratory: Negative.   Cardiovascular: Negative.   Gastrointestinal: Negative.   Musculoskeletal: Negative.   Neurological: Negative.  Hematological: Negative.   Psychiatric/Behavioral: Negative.   All other systems reviewed and are negative.   BP 134/82 mmHg  Pulse 48  Ht 5\' 9"  (1.753 m)  Wt 149 lb 12 oz (67.926 kg)  BMI 22.10 kg/m2  Physical Exam

## 2015-04-30 NOTE — Assessment & Plan Note (Signed)
Status post ablation, seems to be doing well with no recurrent arrhythmia per the patient Low-dose beta blocker started by primary care. She does have asymptomatic sinus bradycardia This was discussed with her in detail. She'll monitor her symptoms for now, start cardiac rehabilitation with monitoring of the heart rate

## 2015-04-30 NOTE — Patient Instructions (Addendum)
You are doing well. No medication changes were made.  We will schedule cardiac rehab for weakness, s/p mitral valve replacement, no energy  Please call us if you have new issues that need to be addressed before your next appt.  Your physician wants you to follow-up in: 6 months.  You will receive a reminder letter in the mail two months in advance. If you don't receive a letter, please call our office to schedule the follow-up appointment.  Cardiac Rehabilitation Cardiac rehabilitation is a medically supervised program that helps improve the health and well-being of people with heart problems. Cardiac rehabilitation includes exercise training, education, and counseling to help you get stronger and return to an active lifestyle. People who participate in cardiac rehabilitation programs get better faster and reduce future hospital stays. Cardiac rehabilitation programs can help when you have had the following conditions:  Heart attack.  Heart failure.  Peripheral artery disease.  Coronary artery disease.  Angina.  Lung or breathing problems. Cardiac rehabilitation programs are also used when you have the following procedures:  Coronary artery bypass graft surgery.  Heart valve replacement.  Heart stent placement.  Heart transplant.  Aneurysm repair. CARDIAC REHABILITATION MAY HELP YOU:  Reduce problems like chest pain and trouble breathing.  Change risk factors that contribute to heart disease, such as:  Smoking.  High blood pressure.  High cholesterol.  Diabetes.  Being out of shape or not active.  Weighing more than 30% over your ideal weight.  Diet.  Improve your mental outlook so you feel:  Less depressed or "blue."  More hopeful.  Better about yourself.  More confident about taking care of yourself.  Get support from health experts as well as other people with similar problems.  Learn how to manage and understand your medicines.  Teach your  family about your condition and how to participate in your recovery. WHAT HAPPENS IN CARDIAC REHABILITATION? You will be assessed by a cardiac rehabilitation team. They will check your health history and do a physical exam. You may need blood tests, stress tests, and other evaluations. You may not start a cardiac rehabilitation program if:  You develop angina with exercise or while at rest.  You have severe heart failure that limits your activity.  You have an abnormal heart rhythm at rest.  You develop heart rhythm problems during exercise.  You have high blood pressure that is not controlled. The cardiac rehabilitation team works with you to make a plan based on your health and goals. Everyone is unique, so each program is customized and your program may change as you progress. Members of a typical cardiac rehabilitation team may include such health professionals as:  Doctors.  Nurses.  Dietitians.  Psychologists.  Exercise specialists.  Physical and occupational therapists. A typical cardiac rehabilitation program is divided into phases. You advance from one phase to the next. Most cardiac rehabilitation sessions last for 60 minutes, 3 times a week.  Phase One starts while you are still in the hospital. You may start by walking in your room and then in the hall. You may start some simple exercises with a therapist. Health care team members will give you information and ask you many questions. You may not be able to remember details, so have a family member or an advocate with you to help keep track of information.  Phase Two begins when you go home or to another facility. This phase may last 8 to 12 weeks. You will travel to a cardiac  rehabilitation center or a place where it is offered. Typically, you gradually increase your activity while being closely watched by a nurse or therapist. Exercises may be a combination of strength or resistance training and "cardio" or aerobic movement  on a treadmill or other machines. Your condition will determine how often and how long these sessions will last.  In phase two, you may learn how to cook healthy meals, control your blood sugar, and manage your medicines. You may need help with scheduling or planning how and when to take your medicines. Use a timer, divided pill box, or follow a form to make taking your medicines easier. Use the method that works best for you. Some medicines should not be taken with certain foods. If you take more than one blood pressure medicine, you may need to stagger the times you take them. Taking all your blood pressure medicine at the same time may lower your blood pressure too much. If you have questions about your medicines, ask your health care provider questions until you understand.  Phase Three continues for the rest of your life. There will be less supervision. You may still participate in cardiac rehabilitation activities or become part of a group in your community. You may benefit from talking to other people about your experience if they are facing similar challenges. How soon you drive, have sex, or return to work will depend on your condition. These decisions should be made by you and your health care provider. If you need help, ask for it. Find out where you can get the help you need. Ask questions until you get answers and understand. SEEK IMMEDIATE MEDICAL CARE IF:  Get medical help at once if you experience any of the following symptoms:  Severe chest discomfort, especially if the pain is crushing or pressure-like and spreads to the arms, back, neck, or jaw. Do not wait to see if the pain will go away.  Weakness or numbness in your face, arms, or legs, especially on one side of the body; slurred speech; confusion; sudden severe headache or loss of vision (all symptoms of stroke).  You have shortness of breath.  You are sweating and feel sick to your stomach (nausea).  You feel dizzy or  faint.  You experience profound tiredness (fatigue). Call your local emergency service (911 in the [place] Do not drive yourself to the hospital. Document Released: 04/21/2008 Document Revised: 11/27/2013 Document Reviewed: 10/17/2010 [facility] Patient Information 2015 [place], [place]. This information is not intended to replace advice given to you by your health care provider. Make sure you discuss any questions you have with your health care provider.

## 2015-04-30 NOTE — Assessment & Plan Note (Signed)
Recommended she continue on her anticoagulation She is in the donut hole. Samples provided

## 2015-04-30 NOTE — Assessment & Plan Note (Signed)
Blood pressure is well controlled on today's visit. No changes made to the medications. Likely secondary to bisoprolol. She will monitor her symptoms for now. If she has worsening fatigue, would hold the beta blocker

## 2015-05-01 ENCOUNTER — Inpatient Hospital Stay
Admission: EM | Admit: 2015-05-01 | Discharge: 2015-05-02 | DRG: 309 | Disposition: A | Discharge status: Home / Self Care | Payer: PPO | Attending: Internal Medicine | Admitting: Internal Medicine

## 2015-05-01 ENCOUNTER — Telehealth: Payer: Self-pay

## 2015-05-01 ENCOUNTER — Encounter: Payer: Self-pay | Admitting: Emergency Medicine

## 2015-05-01 ENCOUNTER — Emergency Department: Payer: PPO

## 2015-05-01 DIAGNOSIS — I471 Supraventricular tachycardia, unspecified: Secondary | ICD-10-CM | POA: Diagnosis present

## 2015-05-01 DIAGNOSIS — F329 Major depressive disorder, single episode, unspecified: Secondary | ICD-10-CM | POA: Diagnosis present

## 2015-05-01 DIAGNOSIS — I4891 Unspecified atrial fibrillation: Secondary | ICD-10-CM | POA: Diagnosis present

## 2015-05-01 DIAGNOSIS — I058 Other rheumatic mitral valve diseases: Secondary | ICD-10-CM | POA: Diagnosis present

## 2015-05-01 DIAGNOSIS — Z9889 Other specified postprocedural states: Secondary | ICD-10-CM | POA: Diagnosis not present

## 2015-05-01 DIAGNOSIS — I10 Essential (primary) hypertension: Secondary | ICD-10-CM | POA: Diagnosis present

## 2015-05-01 DIAGNOSIS — I248 Other forms of acute ischemic heart disease: Secondary | ICD-10-CM | POA: Diagnosis present

## 2015-05-01 DIAGNOSIS — I959 Hypotension, unspecified: Secondary | ICD-10-CM

## 2015-05-01 DIAGNOSIS — R61 Generalized hyperhidrosis: Secondary | ICD-10-CM | POA: Diagnosis present

## 2015-05-01 DIAGNOSIS — Z809 Family history of malignant neoplasm, unspecified: Secondary | ICD-10-CM | POA: Diagnosis not present

## 2015-05-01 DIAGNOSIS — E876 Hypokalemia: Secondary | ICD-10-CM | POA: Diagnosis present

## 2015-05-01 DIAGNOSIS — Z87891 Personal history of nicotine dependence: Secondary | ICD-10-CM

## 2015-05-01 DIAGNOSIS — Z90710 Acquired absence of both cervix and uterus: Secondary | ICD-10-CM

## 2015-05-01 DIAGNOSIS — R001 Bradycardia, unspecified: Secondary | ICD-10-CM | POA: Diagnosis present

## 2015-05-01 DIAGNOSIS — Z833 Family history of diabetes mellitus: Secondary | ICD-10-CM

## 2015-05-01 DIAGNOSIS — Z8673 Personal history of transient ischemic attack (TIA), and cerebral infarction without residual deficits: Secondary | ICD-10-CM

## 2015-05-01 DIAGNOSIS — I4892 Unspecified atrial flutter: Secondary | ICD-10-CM | POA: Diagnosis present

## 2015-05-01 LAB — BASIC METABOLIC PANEL
ANION GAP: 9 (ref 5–15)
BUN: 27 mg/dL — ABNORMAL HIGH (ref 6–20)
CO2: 26 mmol/L (ref 22–32)
Calcium: 9.1 mg/dL (ref 8.9–10.3)
Chloride: 97 mmol/L — ABNORMAL LOW (ref 101–111)
Creatinine, Ser: 0.93 mg/dL (ref 0.44–1.00)
GFR calc Af Amer: >60 mL/min (ref >60)
GLUCOSE: 140 mg/dL — AB (ref 65–99)
POTASSIUM: 3.4 mmol/L — AB (ref 3.5–5.1)
Sodium: 132 mmol/L — ABNORMAL LOW (ref 135–145)

## 2015-05-01 LAB — CBC
HEMATOCRIT: 46.7 % (ref 35.0–47.0)
HEMOGLOBIN: 15.3 g/dL (ref 12.0–16.0)
MCH: 28.2 pg (ref 26.0–34.0)
MCHC: 32.8 g/dL (ref 32.0–36.0)
MCV: 85.8 fL (ref 80.0–100.0)
Platelets: 210 10*3/uL (ref 150–440)
RBC: 5.45 MIL/uL — AB (ref 3.80–5.20)
RDW: 13.9 % (ref 11.5–14.5)
WBC: 9.3 10*3/uL (ref 3.6–11.0)

## 2015-05-01 LAB — TROPONIN I
TROPONIN I: 0.74 ng/mL — AB (ref <0.031)
Troponin I: 0.8 ng/mL — ABNORMAL HIGH (ref <0.031)

## 2015-05-01 LAB — MAGNESIUM: Magnesium: 1.7 mg/dL (ref 1.7–2.4)

## 2015-05-01 MED ORDER — ONDANSETRON HCL 4 MG/2ML IJ SOLN: 4.0000 mg | Freq: Four times a day (QID) | INTRAMUSCULAR | Status: DC | PRN

## 2015-05-01 MED ORDER — ETOMIDATE 2 MG/ML IV SOLN
10.0000 mg | Freq: Once | INTRAVENOUS | Status: AC
  Administered 2015-05-01: 10 mg via INTRAVENOUS

## 2015-05-01 MED ORDER — POTASSIUM CHLORIDE CRYS ER 20 MEQ PO TBCR
20.0000 meq | EXTENDED_RELEASE_TABLET | Freq: Two times a day (BID) | ORAL | Status: DC
  Administered 2015-05-01 – 2015-05-02 (×2): 20 meq via ORAL
  Filled 2015-05-01 (×2): qty 1

## 2015-05-01 MED ORDER — ADENOSINE 12 MG/4ML IV SOLN
INTRAVENOUS | Status: AC
  Filled 2015-05-01: qty 4

## 2015-05-01 MED ORDER — SODIUM CHLORIDE 0.9 % IV BOLUS (SEPSIS)
1000.0000 mL | Freq: Once | INTRAVENOUS | Status: AC
  Administered 2015-05-01: 1000 mL via INTRAVENOUS

## 2015-05-01 MED ORDER — ACETAMINOPHEN 650 MG RE SUPP: 650.0000 mg | Freq: Four times a day (QID) | RECTAL | Status: DC | PRN

## 2015-05-01 MED ORDER — ONDANSETRON HCL 4 MG PO TABS: 4.0000 mg | ORAL_TABLET | Freq: Four times a day (QID) | ORAL | Status: DC | PRN

## 2015-05-01 MED ORDER — ACETAMINOPHEN 325 MG PO TABS: 650.0000 mg | ORAL_TABLET | Freq: Four times a day (QID) | ORAL | Status: DC | PRN

## 2015-05-01 MED ORDER — SODIUM CHLORIDE 0.9 % IJ SOLN
3.0000 mL | INTRAMUSCULAR | Status: DC | PRN
Start: 2015-05-01 — End: 2015-05-02

## 2015-05-01 MED ORDER — SERTRALINE HCL 50 MG PO TABS
50.0000 mg | ORAL_TABLET | Freq: Every day | ORAL | Status: DC
  Administered 2015-05-01: 50 mg via ORAL
  Filled 2015-05-01: qty 1

## 2015-05-01 MED ORDER — APIXABAN 5 MG PO TABS
5.0000 mg | ORAL_TABLET | Freq: Two times a day (BID) | ORAL | Status: DC
  Administered 2015-05-01 – 2015-05-02 (×2): 5 mg via ORAL
  Filled 2015-05-01 (×2): qty 1

## 2015-05-01 MED ORDER — ETOMIDATE 2 MG/ML IV SOLN
INTRAVENOUS | Status: AC
  Filled 2015-05-01: qty 10

## 2015-05-01 MED ORDER — ASPIRIN 81 MG PO CHEW
324.0000 mg | CHEWABLE_TABLET | Freq: Once | ORAL | Status: AC
  Administered 2015-05-01: 324 mg via ORAL
  Filled 2015-05-01: qty 4

## 2015-05-01 MED ORDER — SODIUM CHLORIDE 0.9 % IJ SOLN: 3.0000 mL | Freq: Two times a day (BID) | INTRAMUSCULAR | Status: DC

## 2015-05-01 MED ORDER — ADENOSINE 6 MG/2ML IV SOLN
INTRAVENOUS | Status: AC
  Filled 2015-05-01: qty 2

## 2015-05-01 NOTE — ED Notes (Signed)
Cardioversion shock given at 75J.

## 2015-05-01 NOTE — ED Notes (Signed)
Pt presents with heart palpatations while lying in bed this am. Pt states she feels like her heart is beating real hard. Pt denies any chest pain at this time.

## 2015-05-01 NOTE — ED Notes (Signed)
Pt opens eyes.  Pt able to give name, location and day.

## 2015-05-01 NOTE — H&P (Signed)
[facility] NAME: Colleen Williamson    MR#:  [id number]  DATE OF BIRTH:  08/23/1945  DATE OF ADMISSION:  05/01/2015  PRIMARY CARE PHYSICIAN: Idelle Crouch, MD   REQUESTING/REFERRING PHYSICIAN: Dr. Harvest Dark  CHIEF COMPLAINT:   Chief Complaint  Patient presents with  . Irregular Heart Beat   SVT/palpitations  HISTORY OF PRESENT ILLNESS:  Colleen Williamson  is a 69 y.o. female with a known history of mitral valve prolapse status post repair, SVT with atrial flutter/fibrillation status post ablation, history of previous CVA post mitral valve repair, who presented to the hospital with palpitations and was noted to be in SVT. Patient has a history of atrial flutter/fibrillation and has had a recent ablation done by Dr. Lovena Le in [facility] cardiology. He had been doing well and was taken off her antiarrhythmic flecainide. This morning when she woke up she felt her heart was pounding she took her morning medications and thought it would improve although her symptoms persisted and she therefore came to the ER for further evaluation. Patient was noted to be in SVT with heart rates in the 160s. She was also hypotensive in 80s and therefore underwent urgent cardioversion done in the emergency room. Post electrical cardioversion patient is currently in a sinus rhythm and is presently hemodynamically stable. Hospitalist services were contacted for further treatment and evaluation.  PAST MEDICAL HISTORY:   Past Medical History  Diagnosis Date  . Abnormal mammogram, unspecified 2014  . Breast cyst   . Stroke ([place]) 07/03/2014  . Mitral valve disorder     mitral valve repair    PAST SURGICAL HISTORY:   Past Surgical History  Procedure Laterality Date  . Abdominal hysterectomy  2004  . Eye surgery  2007  . Mitral valve repair  05/2014  . Radiology with anesthesia N/A 07/03/2014    Procedure: RADIOLOGY WITH ANESTHESIA;  Surgeon:  Rob Hickman, MD;  Location: [facility];  Service: Radiology;  Laterality: N/A;  . Cardioversion N/A 09/21/2014    Procedure: CARDIOVERSION;  Surgeon: Josue Hector, MD;  Location: [facility] ENDOSCOPY;  Service: Cardiovascular;  Laterality: N/A;  . Electrophysiologic study N/A 11/28/2014    Procedure: Cardioversion;  Surgeon: Teodoro Spray, MD;  Location: [place] CV LAB;  Service: Cardiovascular;  Laterality: N/A;  . Electrophysiologic study N/A 12/27/2014    Procedure: A-Flutter;  Surgeon: Evans Lance, MD;  Location: [place] CV LAB;  Service: Cardiovascular;  Laterality: N/A;  . Electrophysiologic study N/A 11/28/2014    Procedure: Cardioversion;  Surgeon: Teodoro Spray, MD;  Location: [facility] ORS;  Service: Cardiovascular;  Laterality: N/A;  Have discussed with Dr. Marcello Moores    SOCIAL HISTORY:   Social History  Substance Use Topics  . Smoking status: Never Smoker   . Smokeless tobacco: Never Used     Comment: quit smoking in her 30's  . Alcohol Use: No    FAMILY HISTORY:   Family History  Problem Relation Age of Onset  . Cancer      BOTH SIDES OF THE FAMILY  . Diabetes      MATERNAL SIDE OF THE FAMILY  . Cancer Father     DRUG ALLERGIES:   Allergies  Allergen Reactions  . Sulfa Antibiotics Nausea Only    REVIEW OF SYSTEMS:   Review of Systems  Constitutional: Negative for fever and weight loss.  HENT: Negative for congestion, nosebleeds and tinnitus.   Eyes: Negative for blurred  vision, double vision and redness.  Respiratory: Negative for cough, hemoptysis and shortness of breath.   Cardiovascular: Positive for palpitations. Negative for chest pain, orthopnea, leg swelling and PND.  Gastrointestinal: Negative for nausea, vomiting, abdominal pain, diarrhea and melena.  Genitourinary: Negative for dysuria, urgency and hematuria.  Musculoskeletal: Negative for joint pain and falls.  Neurological: Negative for dizziness, tingling, sensory change, focal weakness,  seizures, weakness and headaches.  Endo/Heme/Allergies: Negative for polydipsia. Does not bruise/bleed easily.  Psychiatric/Behavioral: Negative for depression and memory loss. The patient is not nervous/anxious.     MEDICATIONS AT HOME:   Prior to Admission medications   Medication Sig Start Date End Date Taking? Authorizing Provider  apixaban (ELIQUIS) 5 MG TABS tablet Take 1 tablet (5 mg total) by mouth 2 (two) times daily. 07/09/14  Yes Donzetta Starch, NP  bisoprolol-hydrochlorothiazide (ZIAC) 5-6.25 MG tablet Take 1 tablet by mouth daily.   Yes Historical Provider, MD  sertraline (ZOLOFT) 50 MG tablet Take 50 mg by mouth at bedtime.    Yes Historical Provider, MD      VITAL SIGNS:  Blood pressure 117/67, pulse 46, resp. rate 14, height 5\' 9"  (1.753 m), weight 67.586 kg (149 lb), SpO2 99 %.  PHYSICAL EXAMINATION:  Physical Exam  GENERAL:  69 y.o.-year-old patient lying in the bed with no acute distress.  EYES: Pupils equal, round, reactive to light and accommodation. No scleral icterus. Extraocular muscles intact.  HEENT: Head atraumatic, normocephalic. Oropharynx and nasopharynx clear. No oropharyngeal erythema, moist oral mucosa  NECK:  Supple, no jugular venous distention. No thyroid enlargement, no tenderness.  LUNGS: Normal breath sounds bilaterally, no wheezing, rales, rhonchi. No use of accessory muscles of respiration.  CARDIOVASCULAR: S1, S2 RRR. No murmurs, rubs, gallops, clicks.  ABDOMEN: Soft, nontender, nondistended. Bowel sounds present. No organomegaly or mass.  EXTREMITIES: No pedal edema, cyanosis, or clubbing. + 2 pedal & radial pulses b/l.   NEUROLOGIC: Cranial nerves II through XII are intact. No focal Motor or sensory deficits appreciated b/l PSYCHIATRIC: The patient is alert and oriented x 3. Good affect.  SKIN: No obvious rash, lesion, or ulcer.   LABORATORY PANEL:   CBC  Recent Labs Lab 05/01/15 1453  WBC 9.3  HGB 15.3  HCT 46.7  PLT 210    ------------------------------------------------------------------------------------------------------------------  Chemistries   Recent Labs Lab 05/01/15 1453  NA 132*  K 3.4*  CL 97*  CO2 26  GLUCOSE 140*  BUN 27*  CREATININE 0.93  CALCIUM 9.1   ------------------------------------------------------------------------------------------------------------------  Cardiac Enzymes  Recent Labs Lab 05/01/15 1453  TROPONINI 0.37*   ------------------------------------------------------------------------------------------------------------------  RADIOLOGY:  Dg Chest Portable 1 View  05/01/2015   CLINICAL DATA:  Cardioversion.  EXAM: PORTABLE CHEST 1 VIEW  COMPARISON:  11/28/2014  FINDINGS: Mitral valve replacement. Heart size mildly enlarged. Negative for heart failure. Lungs are clear. Defibrillator pad overlying the cardiac silhouette. Negative for infiltrate or effusion.  IMPRESSION: No active disease.   Electronically Signed   By: Franchot Gallo M.D.   On: 05/01/2015 15:57     IMPRESSION AND PLAN:   69 year old female with past medical history of mitral valve prolapse status post repair, history of atrial fibrillation/flutter status post ablation, depression, who presents to the hospital due to palpitations and noted to be in SVT.  #1 SVT-patient had unstable and her complex tachycardia. She underwent electrical cardioversion in the ER and is currently in sinus rhythm and hemodynamically stable. -Continue Eliquis, I will get a cardiology consult. Patient was on  flecainide and likely needs to be an antiarrhythmic prior to discharge but I will defer this to cardiology. -I will hold her bisoprolol as her heart rate is currently in the high 40s to low 50s.  #2 elevated troponin-likely the setting of demand ischemia from the SVT.  -No evidence of acute coronary syndrome, chest pain. -I will cycle her cardiac markers. Await for cardiology input.  #3 depression-continue  Zoloft  #4 hypokalemia-I will start the patient on some oral potassium supplements. Check a magnesium level.  All the records are reviewed and case discussed with ED provider. Management plans discussed with the patient, family and they are in agreement.  CODE STATUS: Full  TOTAL TIME TAKING CARE OF THIS PATIENT: 45 minutes.    Henreitta Leber M.D on 05/01/2015 at 5:13 PM  Between 7am to 6pm - Pager - [phone]  After 6pm go to [email or web address] - password [email or web address] [facility]  Office  [phone]  CC: Primary care physician; Idelle Crouch, MD

## 2015-05-01 NOTE — ED Notes (Signed)
Pt heart rate dropped in the 40's and then increased up to 121, pt clammy to touch in triage

## 2015-05-01 NOTE — Progress Notes (Signed)
Colleen Williamson is a 69 y.o. female patient admitted from ED awake, alert - oriented  X 4 - no acute distress noted.  VSS - Blood pressure 112/63, pulse 47, temperature 98.4 F (36.9 C), temperature source Oral, resp. rate 18, height 5\' 9"  (1.753 m), weight 67.586 kg (149 lb), SpO2 98 %.    IV in place, occlusive dsg intact without redness.  Orientation to room, and floor completed with information packet given to patient/family. Admission INP armband ID verified with patient/family, and in place.   SR up x 2, fall assessment complete, with patient and family able to verbalize understanding of risk associated with falls, and verbalized understanding to call nsg before up out of bed if she feels unsafe.  Call light within reach, patient able to voice, and demonstrate understanding.  Skin, clean-dry- intact without evidence of bruising, or skin tears.   No evidence of skin break down noted on exam. Skin assessed with Izora Gala.      Will cont to eval and treat per MD orders.  Horton Finer, RN 05/01/2015 6:45 PM

## 2015-05-01 NOTE — Telephone Encounter (Signed)
Spoke w/ pt's sister Baker Janus.  She reports that pt is currently in the ED.  Reports that pt "clammed up and fainted, they shocked her heart again". Advised her that I will make Dr. Rockey Situ aware.

## 2015-05-01 NOTE — Telephone Encounter (Signed)
Pt states she had palpitations this morning, states she took a Flecainide, and can still feel "irregularity". Please call.

## 2015-05-01 NOTE — ED Provider Notes (Addendum)
[facility] Emergency Department Provider Note  Time seen: 2:45 PM  I have reviewed the triage vital signs and the nursing notes.   HISTORY  Chief Complaint Irregular Heart Beat    HPI Colleen Williamson is a 69 y.o. female with a past medical history of mitral valve repair, CVA, ablation, presents to the emergency department today for palpitations, dizziness/lightheadedness. According to the patient beginning this morning he has been having palpitations where she feels her heart racing at times, and slow at other times. She states she is weak, very dizzy at times, diaphoretic at times, nauseated at times. She called Dr. Candis Musa her cardiologist who instructed her to take 50 mg of flecainide. Patient states she did this but continued to feel dizzy and lightheaded that she came to the emergency department for evaluation. Patient denies any chest pain, pressure, shortness of breath now or at any time.Describes her symptoms as moderate to severe.    Past Medical History  Diagnosis Date  . Abnormal mammogram, unspecified 2014  . Breast cyst   . Stroke ([place]) 07/03/2014  . Mitral valve disorder     mitral valve repair    Patient Active Problem List   Diagnosis Date Noted  . Essential hypertension 04/30/2015  . Atrial flutter ([place]) 12/13/2014  . Fatigue 12/13/2014  . 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 S/P MVR (mitral valve repair) 09/20/2014  . CVA (cerebral infarction) 07/03/2014  . Atrial fibrillation ([place]) 07/03/2014  . Stroke ([place])   . Breast cyst 01/03/2013    Past Surgical History  Procedure Laterality Date  . Abdominal hysterectomy  2004  . Eye surgery  2007  . Mitral valve repair  05/2014  . Radiology with anesthesia N/A 07/03/2014    Procedure: RADIOLOGY WITH ANESTHESIA;  Surgeon: Rob Hickman, MD;  Location: [place];  Service: Radiology;  Laterality: N/A;  . Cardioversion N/A 09/21/2014     Procedure: CARDIOVERSION;  Surgeon: Josue Hector, MD;  Location: [facility] ENDOSCOPY;  Service: Cardiovascular;  Laterality: N/A;  . Electrophysiologic study N/A 11/28/2014    Procedure: Cardioversion;  Surgeon: Teodoro Spray, MD;  Location: [place] CV LAB;  Service: Cardiovascular;  Laterality: N/A;  . Electrophysiologic study N/A 12/27/2014    Procedure: A-Flutter;  Surgeon: Evans Lance, MD;  Location: [place] [facility];  Service: Cardiovascular;  Laterality: N/A;  . Electrophysiologic study N/A 11/28/2014    Procedure: Cardioversion;  Surgeon: Teodoro Spray, MD;  Location: [facility] ORS;  Service: Cardiovascular;  Laterality: N/A;  Have discussed with Dr. Marcello Moores    Current Outpatient Rx  Name  Route  Sig  Dispense  Refill  . apixaban (ELIQUIS) 5 MG TABS tablet   Oral   Take 1 tablet (5 mg total) by mouth 2 (two) times daily.   60 tablet   2   . bisoprolol-hydrochlorothiazide (ZIAC) 5-6.25 MG tablet   Oral   Take 1 tablet by mouth daily.         . Ibuprofen-Diphenhydramine Cit (IBUPROFEN PM PO)   Oral   Take by mouth as needed.         . naproxen sodium (ANAPROX) 220 MG tablet   Oral   Take 220 mg by mouth as needed (for pain).         Marland Kitchen sertraline (ZOLOFT) 50 MG tablet   Oral   Take 50 mg by mouth daily.  Allergies Sulfa antibiotics  Family History  Problem Relation Age of Onset  . Cancer      BOTH SIDES OF THE FAMILY  . Diabetes      MATERNAL SIDE OF THE FAMILY  . Cancer Father     Social History Social History  Substance Use Topics  . Smoking status: Former Research scientist (life sciences)  . Smokeless tobacco: Never Used     Comment: quit smoking in her 30's  . Alcohol Use: No    Review of Systems Constitutional: Negative for fever. Cardiovascular: Negative for chest pain. Positive for palpitations. Respiratory: Negative for shortness of breath. Gastrointestinal: Negative for abdominal pain. Positive for nausea. Genitourinary: Negative for dysuria. Neurological:  Negative for headache 10-point ROS otherwise negative.  ____________________________________________   PHYSICAL EXAM:  VITAL SIGNS: ED Triage Vitals  Enc Vitals Group     BP 05/01/15 1433 96/59 mmHg     Pulse Rate 05/01/15 1433 47     Resp 05/01/15 1433 16     Temp --      Temp src --      SpO2 05/01/15 1433 97 %     Weight 05/01/15 1433 149 lb (67.586 kg)     Height 05/01/15 1433 5\' 9"  (1.753 m)     Head Cir --      Peak Flow --      Pain Score 05/01/15 1433 0     Pain Loc --      Pain Edu? --      Excl. in [place]? --     Constitutional: Alert and oriented. Mildly diaphoretic, pale appearing. Eyes: Normal exam ENT   Head: Normocephalic and atraumatic   Mouth/Throat: Dry mucous members. Cardiovascular: Irregular rhythm, rate around 120 bpm.  Respiratory: Normal respiratory effort without tachypnea nor retractions. Breath sounds are clear and equal bilaterally. No wheezes/rales/rhonchi. Gastrointestinal: Soft and nontender. No distention. Musculoskeletal: Nontender with normal range of motion in all extremities. No lower extremity tenderness or edema. Neurologic:  Normal speech and language. No gross focal neurologic deficits are appreciated. Psychiatric: Mood and affect are normal. Speech and behavior are normal. Patient exhibits appropriate insight and judgment.  ____________________________________________    EKG  EKG 14:39:53 reviewed and interpreted by myself shows sinus tachycardia with multiple PVCs at 119 bpm. Narrow QRS, normal axis, largely normal intervals. Nonspecific ST changes are present. No salivation noted.  EKG 14:50:25 reviewed and interpreted by myself shows supraventricular tachycardia 161 bpm. Nonspecific ST changes present with ST depressions laterally. No see elevations noted. Narrow QRS.  EKG 15:13:39 reviewed and interpreted by myself shows sinus rhythm at 68 bpm, narrow QRS with frequent PVCs, intervals within normal limits, normal axis.  Nonspecific ST changes present. No ST elevations.  ____________________________________________    RADIOLOGY  No active disease on chest x-ray.  ____________________________________________   INITIAL IMPRESSION / ASSESSMENT AND PLAN / ED COURSE  Pertinent labs & imaging results that were available during my care of the patient were reviewed by me and considered in my medical decision making (see chart for details).  Patient presents with palpitations. Upon arrival the patient had an irregular rhythm, EKG showing many PVCs in a bigeminy pattern. She became diaphoretic and her pulse dropped to the 40s, stated she felt very weak and lightheaded. She was brought emergently back to a room in the emergency department. Upon arrival to the room the patient's heart rate was noted to be approximately 160 with an EKG showing supraventricular tachycardia. Patient's blood pressure at that time was  80/40. 2 L normal saline was hung on pressure bags, her blood pressure increased to 90/60, however at this time the patient began feeling very lightheaded and dizzy. At this time I made the decision to move forward with synchronized cardioversion. Patient was given 10 mg of etomidate, and synchronized cardioversion performed at 75 J. Patient had immediate conversion to normal sinus rhythm with multiple PVCs. Patient's blood pressure immediately improved to 120s/80s. Patient did have a somewhat prolonged recovery following etomidate of approximately 10 minutes. Patient is now awake, alert and oriented. Did not feel the cardioversion nor recall the cardioversion. Currently awaiting cardiology consult with Dr.Golan.     Discussed with Dr. Ellyn Hack. He recommends close monitoring. If patient returns to SVT recommends a trial of amiodarone. Currently awaiting lab results, patient remains awake, alert and oriented, well appearing with a normal blood pressure. Lane to admit the patient to the hospital once lab results are  back.   Patient continues to appear well. No further episodes of SVT. Blood pressure and vitals have remained stable with a heart rate around 50-60 bpm blood pressure in the 110s over 80s. Patient's troponin have resulted 0.37. Aspirin has been dosed. Patient will be admitted to the medical service with cardiology consultation.        CRITICAL CARE Performed by: Harvest Dark   Total critical care time: 60 minutes  Critical care time was exclusive of separately billable procedures and treating other patients.  Critical care was necessary to treat or prevent imminent or life-threatening deterioration.  Critical care was time spent personally by me on the following activities: development of treatment plan with patient and/or surrogate as well as nursing, discussions with consultants, evaluation of patient's response to treatment, examination of patient, obtaining history from patient or surrogate, ordering and performing treatments and interventions, ordering and review of laboratory studies, ordering and review of radiographic studies, pulse oximetry and re-evaluation of patient's condition.  Procedural sedation Performed by: Harvest Dark Consent: Verbal consent obtained. Risks and benefits: risks, benefits and alternatives were discussed Required items: required blood products, implants, devices, and special equipment available Patient identity confirmed: arm band and provided demographic data Time out: Immediately prior to procedure a "time out" was called to verify the correct patient, procedure, equipment, support staff and site/side marked as required.  Sedation type: moderate (conscious) sedation NPO time confirmed and considedered  Sedatives: ETOMIDATE  Physician Time at Bedside: 30 minutes  Vitals: Vital signs were monitored during sedation. Cardiac Monitor, pulse oximeter Patient tolerance: Patient tolerated the procedure well with no immediate  complications. Comments: Pt with uneventful recovered. Returned to pre-procedural sedation baseline  Electrical Cardioversion Procedure Note HAYSLEE CASEBOLT [id number] 08-07-1945  Procedure: Electrical Cardioversion Indications:  Unstable SVT  Procedure Details Consent: Verbal consent obtained Time Out: Verified patient identification, verified procedure, site/side was marked, verified correct patient position, special equipment/implants available, medications/allergies/relevent history reviewed, required imaging and test results available.   Patient placed on cardiac monitor, pulse oximetry, supplemental oxygen as necessary.  Sedation given: Etomidate Pacer pads placed anterior and posterior chest.  Cardioverted 1 time(s).  Cardioverted at [place].  Evaluation Findings: Post procedure EKG shows: NSR Complications: None Patient did tolerate procedure well.   Harvest Dark 05/01/2015, 3:30 PM       ____________________________________________   FINAL CLINICAL IMPRESSION(S) / ED DIAGNOSES  Arrhythmia Supraventricular tachycardia   Harvest Dark, MD 05/01/15 [place], MD 05/17/15 [phone]

## 2015-05-01 NOTE — ED Notes (Signed)
MD at bedside. 

## 2015-05-01 NOTE — Telephone Encounter (Signed)
Patient called again

## 2015-05-02 ENCOUNTER — Other Ambulatory Visit: Payer: Self-pay

## 2015-05-02 ENCOUNTER — Telehealth: Payer: Self-pay

## 2015-05-02 DIAGNOSIS — I471 Supraventricular tachycardia: Principal | ICD-10-CM

## 2015-05-02 LAB — CBC
HEMATOCRIT: 38.4 % (ref 35.0–47.0)
Hemoglobin: 12.6 g/dL (ref 12.0–16.0)
MCH: 28.2 pg (ref 26.0–34.0)
MCHC: 32.8 g/dL (ref 32.0–36.0)
MCV: 86.1 fL (ref 80.0–100.0)
PLATELETS: 161 10*3/uL (ref 150–440)
RBC: 4.46 MIL/uL (ref 3.80–5.20)
RDW: 13.8 % (ref 11.5–14.5)
WBC: 6.6 10*3/uL (ref 3.6–11.0)

## 2015-05-02 LAB — TROPONIN I: Troponin I: 0.37 ng/mL — ABNORMAL HIGH (ref <0.031)

## 2015-05-02 LAB — BASIC METABOLIC PANEL
Anion gap: 5 (ref 5–15)
BUN: 23 mg/dL — ABNORMAL HIGH (ref 6–20)
CHLORIDE: 109 mmol/L (ref 101–111)
CO2: 28 mmol/L (ref 22–32)
Calcium: 8.7 mg/dL — ABNORMAL LOW (ref 8.9–10.3)
Creatinine, Ser: 0.71 mg/dL (ref 0.44–1.00)
GFR calc non Af Amer: >60 mL/min (ref >60)
Glucose, Bld: 86 mg/dL (ref 65–99)
POTASSIUM: 4.2 mmol/L (ref 3.5–5.1)
SODIUM: 142 mmol/L (ref 135–145)

## 2015-05-02 MED ORDER — POTASSIUM CHLORIDE CRYS ER 20 MEQ PO TBCR: 20.0000 meq | EXTENDED_RELEASE_TABLET | Freq: Two times a day (BID) | ORAL | Status: DC

## 2015-05-02 MED ORDER — ONDANSETRON HCL 4 MG PO TABS: 4.0000 mg | ORAL_TABLET | Freq: Four times a day (QID) | ORAL | Status: DC | PRN

## 2015-05-02 MED ORDER — MAGNESIUM OXIDE 400 (241.3 MG) MG PO TABS: 400.0000 mg | ORAL_TABLET | Freq: Two times a day (BID) | ORAL | Status: DC

## 2015-05-02 MED ORDER — MAGNESIUM OXIDE 400 (241.3 MG) MG PO TABS
400.0000 mg | ORAL_TABLET | Freq: Two times a day (BID) | ORAL | Status: DC
  Administered 2015-05-02: 400 mg via ORAL
  Filled 2015-05-02: qty 1

## 2015-05-02 NOTE — Discharge Instructions (Signed)
Return to ER if worse.

## 2015-05-02 NOTE — Progress Notes (Signed)
Pt discharged home via wheelchair, daughter at side. IV discontinued without incident. Home medication list, prescriptions and follow-up appointment information given. Reviewed home med list with patient. Questions answered, pt verbalized understanding.

## 2015-05-02 NOTE — Discharge Summary (Signed)
Colleen Williamson, is a 69 y.o. female  DOB 1946-06-06  MRN [id number].  Admission date:  05/01/2015  Admitting Physician  Henreitta Leber, MD  Discharge Date:  05/02/2015   Primary MD  SPARKS,JEFFREY D, MD  Recommendations for primary care physician for things to follow:      Admission Diagnosis  SVT (supraventricular tachycardia) (HCC) [I47.1] Hypotension, unspecified hypotension type [I95.9]   Discharge Diagnosis  SVT (supraventricular tachycardia) (HCC) [I47.1] Hypotension, unspecified hypotension type [I95.9]    Active Problems:   SVT (supraventricular tachycardia) (HCC)      Past Medical History  Diagnosis Date  . Abnormal mammogram, unspecified 2014  . Breast cyst   . Stroke ([place]) 07/03/2014  . Mitral valve disorder     mitral valve repair    Past Surgical History  Procedure Laterality Date  . Abdominal hysterectomy  2004  . Eye surgery  2007  . Mitral valve repair  05/2014  . Radiology with anesthesia N/A 07/03/2014    Procedure: RADIOLOGY WITH ANESTHESIA;  Surgeon: Rob Hickman, MD;  Location: [place];  Service: Radiology;  Laterality: N/A;  . Cardioversion N/A 09/21/2014    Procedure: CARDIOVERSION;  Surgeon: Josue Hector, MD;  [facility] ENDOSCOPY;  Service: Cardiovascular;  Laterality: N/A;  . Electrophysiologic study N/A 11/28/2014    Procedure: Cardioversion;  Surgeon: Teodoro Spray, MD;  Location: [place] [facility];  Service: Cardiovascular;  Laterality: N/A;  . Electrophysiologic study N/A 12/27/2014    Procedure: A-Flutter;  Surgeon: Evans Lance, MD;  Location: [place] [facility];  Service: Cardiovascular;  Laterality: N/A;  . Electrophysiologic study N/A 11/28/2014    Procedure: Cardioversion;  Surgeon: Teodoro Spray, MD;  Location: [facility] ORS;  Service: Cardiovascular;  Laterality: N/A;  Have discussed with Dr. Marcello Moores       History of present illness and   Hospital Course:     Kindly see H&P for history of present illness and admission details, please review complete Labs, Consult reports and Test reports for all details in brief  HPI  from the history and physical done on the day of admission    Hospital Course    Pt with SVT cardioverted in ER. Remains in sinus rhythm. Cleared for D/C by Cardiology.   Discharge Condition: stable   Follow UP  Follow-up Information    Follow up with SPARKS,JEFFREY D, MD In 1 week.   Specialty:  Internal Medicine   Contact information:   [place] [address] [phone]         Discharge Instructions  and  Discharge Medications        Medication List    STOP taking these medications        bisoprolol-hydrochlorothiazide 5-6.25 MG tablet  Commonly known as:  ZIAC      TAKE these medications        apixaban 5 MG Tabs tablet  Commonly known as:  ELIQUIS  Take 1 tablet (5 mg total)  by mouth 2 (two) times daily.     magnesium oxide 400 (241.3 MG) MG tablet  Commonly known as:  MAG-OX  Take 1 tablet (400 mg total) by mouth 2 (two) times daily.     ondansetron 4 MG tablet  Commonly known as:  ZOFRAN  Take 1 tablet (4 mg total) by mouth every 6 (six) hours as needed for nausea.     potassium chloride SA 20 MEQ tablet  Commonly known as:  K-DUR,KLOR-CON  Take 1 tablet (20 mEq total) by mouth 2 (two) times daily.     sertraline 50 MG tablet  Commonly known as:  ZOLOFT  Take 50 mg by mouth at bedtime.          Diet and Activity recommendation: See Discharge Instructions above   Consults obtained - Cardiology   Major procedures and Radiology Reports - PLEASE review detailed and final reports for all details, in brief -   See below   Dg Chest Portable 1 View  05/01/2015   CLINICAL DATA:  Cardioversion.  EXAM: PORTABLE CHEST 1 VIEW  COMPARISON:  11/28/2014  FINDINGS: Mitral valve replacement. Heart size mildly enlarged.  Negative for heart failure. Lungs are clear. Defibrillator pad overlying the cardiac silhouette. Negative for infiltrate or effusion.  IMPRESSION: No active disease.   Electronically Signed   By: Franchot Gallo M.D.   On: 05/01/2015 15:57    Micro Results   See below  No results found for this or any previous visit (from the past 240 hour(s)).     Today   Subjective:   Shanece Cochrane today has no headache,no chest abdominal pain,no new weakness tingling or numbness, feels much better wants to go home today.    Objective:   Blood pressure 133/76, pulse 64, temperature 98.4 F (36.9 C), temperature source Oral, resp. rate 20, height 5\' 9"  (1.753 m), weight 67.586 kg (149 lb), SpO2 100 %.   Intake/Output Summary (Last 24 hours) at 05/02/15 1215 Last data filed at 05/02/15 0830  Gross per 24 hour  Intake    120 ml  Output      0 ml  Net    120 ml    Exam Awake Alert, Oriented x 3, No new F.N deficits, Normal affect [place].AT,PERRAL Supple Neck,No JVD, No cervical lymphadenopathy appriciated.  Symmetrical Chest wall movement, Good air movement bilaterally, CTAB RRR,No Gallops,Rubs or new Murmurs, No Parasternal Heave +ve B.Sounds, Abd Soft, Non tender, No organomegaly appriciated, No rebound -guarding or rigidity. No Cyanosis, Clubbing or edema, No new Rash or bruise  Data Review   CBC w Diff: Lab Results  Component Value Date   WBC 6.6 05/02/2015   WBC 6.2 12/11/2013   HGB 12.6 05/02/2015   HGB 14.4 12/11/2013   HCT 38.4 05/02/2015   HCT 44.1 12/11/2013   PLT 161 05/02/2015   PLT 189 12/16/2013   LYMPHOPCT 10 11/28/2014   LYMPHOPCT 32.2 12/11/2013   MONOPCT 5 11/28/2014   MONOPCT 8.7 12/11/2013   EOSPCT 1 11/28/2014   EOSPCT 3.2 12/11/2013   BASOPCT 1 11/28/2014   BASOPCT 1.4 12/11/2013    CMP: Lab Results  Component Value Date   NA 142 05/02/2015   NA 141 11/23/2013   K 4.2 05/02/2015   K 3.8 11/23/2013   CL 109 05/02/2015   CL 105 11/23/2013   CO2 28  05/02/2015   CO2 31 11/23/2013   BUN 23* 05/02/2015   BUN 11 11/23/2013   CREATININE 0.71 05/02/2015  CREATININE 0.57* 11/23/2013   PROT 7.1 11/28/2014   ALBUMIN 4.2 11/28/2014   BILITOT 2.1* 11/28/2014   ALKPHOS 110 11/28/2014   AST 69* 11/28/2014   ALT 32 11/28/2014  .   Total Time in preparing paper work, data evaluation and todays exam - 35 minutes  SPARKS,JEFFREY D M.D on 05/02/2015 at 12:15 PM

## 2015-05-02 NOTE — Consult Note (Signed)
ELECTROPHYSIOLOGY CONSULT NOTE  Patient ID: Colleen Williamson, MRN: [id number], DOB/AGE: [birth year] 67 y.o. Admit date: 05/01/2015 Date of Consult: 05/02/2015  Primary Physician: Idelle Crouch, MD Primary Cardiologist: TG  Chief Complaint: SVT   HPI Colleen Williamson is a 69 y.o. female admitted following an episode yesterday of SVT.  She is a complicated past medical history which involves mitral valve prolapse for which he underwent mitral valve repair at [facility] 12/15. 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 6/16 she underwent catheter ablation of typical atrial flutter in the context of flecainide therapy by Dr. Elliot Cousin; at that time no other mechanisms of SVT were identified.  Over recent months she has had significant problems with anorexia and weight loss. He was initially ascribed to the flecainide and its metallic taste; however, this drug was stopped about 5 or 6 weeks ago and has been no interval improvement. She has continued to lose weight.  She is also struggled significantly with fatigue. Her heart rate on admission was 45 ((following cardioversion) prior to the initiation bisoprolol for hypertension her heart rate was 71.  Yesterday she awakened with tachypalpitations. He became progressively problematic. She went to the emergency room. The first echocardiogram demonstrated a bigeminal rhythm with a heart rate of 119. She then developed SVT and was described as having a blood pressure of 80 in the H&P. ER notes are not available. She underwent cardioversion. The patient describes being told that her heart rate was going fast and slow. She's had no recurrent tachycardia on the monitor.    Past Medical History  Diagnosis Date  . Abnormal mammogram, unspecified 2014  . Breast cyst   . Stroke ([place]) 07/03/2014  . Mitral valve disorder     mitral valve repair        Surgical History:  Past Surgical History  Procedure Laterality Date  . Abdominal hysterectomy  2004  . Eye surgery  2007  . Mitral valve repair  05/2014  . Radiology with anesthesia N/A 07/03/2014    Procedure: RADIOLOGY WITH ANESTHESIA;  Surgeon: Rob Hickman, MD;  Location: [place];  Service: Radiology;  Laterality: N/A;  . Cardioversion N/A 09/21/2014    Procedure: CARDIOVERSION;  Surgeon: Josue Hector, MD;  Location: [facility] ENDOSCOPY;  Service: Cardiovascular;  Laterality: N/A;  . Electrophysiologic study N/A 11/28/2014    Procedure: Cardioversion;  Surgeon: Teodoro Spray, MD;  Location: [place] [facility];  Service: Cardiovascular;  Laterality: N/A;  . Electrophysiologic study N/A 12/27/2014    Procedure: A-Flutter;  Surgeon: Evans Lance, MD;  Location: [place] [facility];  Service: Cardiovascular;  Laterality: N/A;  . Electrophysiologic study N/A 11/28/2014    Procedure: Cardioversion;  Surgeon: Teodoro Spray, MD;  Location: [facility] ORS;  Service: Cardiovascular;  Laterality: N/A;  Have discussed with Dr. Marcello Moores     Home Meds: Prior to Admission medications   Medication Sig Start Date End Date Taking? Authorizing Provider  apixaban (ELIQUIS) 5 MG TABS tablet Take 1 tablet (5 mg total) by mouth 2 (two) times daily. 07/09/14  Yes Donzetta Starch, NP  bisoprolol-hydrochlorothiazide (ZIAC) 5-6.25 MG tablet Take 1 tablet by mouth daily.   Yes Historical Provider, MD  sertraline (ZOLOFT) 50 MG tablet Take 50 mg by mouth at bedtime.    Yes Historical Provider, MD    Inpatient Medications:  . apixaban  5 mg Oral  BID  . magnesium oxide  400 mg Oral BID  . potassium chloride  20 mEq Oral BID  . sertraline  50 mg Oral QHS     Allergies:  Allergies  Allergen Reactions  . Sulfa Antibiotics Nausea Only    Social History   Social History  . Marital Status: Widowed    Spouse Name: N/A  . Number of Children: 3  . Years of Education: 12+   Occupational History  . Not on file.    Social History Main Topics  . Smoking status: Never Smoker   . Smokeless tobacco: Never Used     Comment: quit smoking in her 30's  . Alcohol Use: No  . Drug Use: No  . Sexual Activity: Not on file   Other Topics Concern  . Not on file   Social History Narrative   Patient is widowed with 3 children.   Patient is right handed.   Patient has hs education plus 1-2 yrs of college.   Patient drinks 1 cup daily.     Family History  Problem Relation Age of Onset  . Cancer      BOTH SIDES OF THE FAMILY  . Diabetes      MATERNAL SIDE OF THE FAMILY  . Cancer Father      ROS:  Please see the history of present illness.     All other systems reviewed and negative.    Physical Exam   Blood pressure 115/62, pulse 52, temperature 98.3 F (36.8 C), temperature source Oral, resp. rate 16, height 5\' 9"  (1.753 m), weight 149 lb (67.586 kg), SpO2 96 %. General: Well developed, well nourished female in no acute distress. Head: Normocephalic, atraumatic, sclera non-icteric, no xanthomas, nares are without discharge. EENT: normal Lymph Nodes:  none Back: without scoliosis/kyphosis , no CVA tendersness Neck: Negative for carotid bruits. JVD not elevated. Lungs: Clear bilaterally to auscultation without wheezes, rales, or rhonchi. Breathing is unlabored. Heart: RRR with S1 S2. No* * murmur , rubs, or gallops appreciated. Abdomen: Soft, non-tender, non-distended with normoactive bowel sounds. No hepatomegaly. No rebound/guarding. No obvious abdominal masses. Msk:  Strength and tone appear normal for age. Extremities: No clubbing or cyanosis. No * edema.  Distal pedal pulses are 2+ and equal bilaterally. Skin: Warm and Dry Neuro: Alert and oriented X 3. CN III-XII intact Grossly normal sensory and motor function . Psych:  Responds to questions appropriately with a normal affect.      Labs: Cardiac Enzymes  Recent Labs  05/01/15 1453 05/01/15 1815 05/01/15 2135  TROPONINI 0.37*  0.74* 0.80*   CBC Lab Results  Component Value Date   WBC 6.6 05/02/2015   HGB 12.6 05/02/2015   HCT 38.4 05/02/2015   MCV 86.1 05/02/2015   PLT 161 05/02/2015   PROTIME: No results for input(s): LABPROT, INR in the last 72 hours. Chemistry  Recent Labs Lab 05/02/15 0400  NA 142  K 4.2  CL 109  CO2 28  BUN 23*  CREATININE 0.71  CALCIUM 8.7*  GLUCOSE 86   Lipids Lab Results  Component Value Date   CHOL 132 07/04/2014   HDL 46 07/04/2014   LDLCALC 55 07/04/2014   TRIG 153* 07/04/2014   BNP No results found for: PROBNP Thyroid Function Tests: No results for input(s): TSH, T4TOTAL, T3FREE, THYROIDAB in the last 72 hours.  Invalid input(s): FREET3    Miscellaneous No results found for: DDIMER  Radiology/Studies:  Dg Chest Portable 1 View  05/01/2015  CLINICAL DATA:  Cardioversion.  EXAM: PORTABLE CHEST 1 VIEW  COMPARISON:  11/28/2014  FINDINGS: Mitral valve replacement. Heart size mildly enlarged. Negative for heart failure. Lungs are clear. Defibrillator pad overlying the cardiac silhouette. Negative for infiltrate or effusion.  IMPRESSION: No active disease.   Electronically Signed   By: Franchot Gallo M.D.   On: 05/01/2015 15:57    EKG:  3 EKGs were reviewed. 1 demonstrated sinus bradycardia at a rate of 48 14:50  narrow QRS tachycardia rate 1 60 bpm -short RP 14:39 what appears to be sinus rhythm with atrial bigeminy. At a rate of 119.  I am unable to March out P waves and I am unable to demonstrate that this is the same rhythm as the heart rate of 160 with variable conduction   Assessment and Plan:   Atrial flutter status post ablation   SVT -short RP    sinus rhythm with atrial bigeminy  Sinus bradycardia  Hypertension  Anorexia  Mitral valve repair for mitral valve prolapse  Stroke complicating the above on ELIQUIS   The patient presents with an unusual arrhythmia syndrome with "heart rate going fast and slow". Unfortunately the data is  not available in the telemetry record that I can find. Notably, the ECG from 14:39 has a different rhythm with a 1 at 14:50. As best as I can tell these ARE NOT the same rhythm based on P P intervals, based on rate relationships. It does not appear to be Wenckebach 3:2.  Hence, it appears that she has 2 separate tachycardia mechanisms 1D short RP tachycardia and the other is atrial rate with bigeminal pattern resulting in a tachycardia. It may have been that this pattern of PACs induced the SVT.  Currently she has significant sinus bradycardia. This appears to be secondary to the bisoprolol as heart rates in August off beta blockers was 71.   treatment options in the intermediate term would include repeat catheter ablation. It is possible that a mechanism of reentrant SVT was missed because of flecainide as a conduction slowing agent masking the presence of a accessory connection  This would obviate the need for flecainide which was associated with anorexia and a metallic taste; it is noteworthy that notwithstanding being off the flecainide for the last 6 weeks, that she is continuing to struggle with weight loss and anorexia. However, alternative anteriorly drugs would all have a beta blocker effect and we will re-create the bradycardia which we think at this point is iatrogenic i.e. related to the bisoprolol initiated for her blood pressure.  As her rhythms are not life threatening and these medications are not well-tolerated I think it is reasonable for her to go home on nothing. We will arrange for follow-up with Dr. Elliot Cousin. to discuss repeat catheter ablation.  It is reasonable to think about an assessment of chronotropic competence as a cause of her ongoing fatigue and weakness as suggested by the profound impact of the low dose of beta blocker on her sinus rate. I am not sanguine that this will be reviewing but it is "low hanging fruit".  Her blood pressures are not unreasonable at this time.  Perhaps less medication is appropriate given the very difficulty time she has had with weight loss and energy.  GI evaluation for her anorexia seems appropriate  I agree with Dr. Doy Hutching that the troponin is probably demand. There is a record of a catheterization in care everywhere at "[facility]" slight presume was done at [facility]. I presume  it was normal and but the report is not available   Recommendations #1 -stop bisoprolol #2-okay to discharge #3-hold off resuming flecainide 4-we'll arrange follow-up with Dr. Elliot Cousin for consideration of repeat ablation 5-outpatient stress test for chronotropic competence 6-consider GI evaluation for anorexia and weight loss  thnask for conuslt  [employer]

## 2015-05-02 NOTE — Telephone Encounter (Signed)
Per dr Caryl Comes, pt needs Treadmill stress test. [facility] for pt to schedule.

## 2015-05-02 NOTE — Progress Notes (Signed)
Colleen Williamson is a 69 y.o. female  <principal problem not specified>   SUBJECTIVE:  Pt in NAD. Remains bradycardic in sinus rhythm. Denies CP or SOB. Troponins mildly elevated presumably due to demand ischemia. Other labs OK. BP stable.  ______________________________________________________________________  ROS: Review of systems is unremarkable for any active cardiac,respiratory, GI, GU, hematologic, neurologic or psychiatric systems, 10 systems reviewed.  @CMEDLIST @  Past Medical History  Diagnosis Date  . Abnormal mammogram, unspecified 2014  . Breast cyst   . Stroke ([place]) 07/03/2014  . Mitral valve disorder     mitral valve repair    Past Surgical History  Procedure Laterality Date  . Abdominal hysterectomy  2004  . Eye surgery  2007  . Mitral valve repair  05/2014  . Radiology with anesthesia N/A 07/03/2014    Procedure: RADIOLOGY WITH ANESTHESIA;  Surgeon: Rob Hickman, MD;  Location: [place];  Service: Radiology;  Laterality: N/A;  . Cardioversion N/A 09/21/2014    Procedure: CARDIOVERSION;  Surgeon: Josue Hector, MD;  Location: [facility] ENDOSCOPY;  Service: Cardiovascular;  Laterality: N/A;  . Electrophysiologic study N/A 11/28/2014    Procedure: Cardioversion;  Surgeon: Teodoro Spray, MD;  Location: [place] [facility] LAB;  Service: Cardiovascular;  Laterality: N/A;  . Electrophysiologic study N/A 12/27/2014    Procedure: A-Flutter;  Surgeon: Evans Lance, MD;  Location: [place] [facility];  Service: Cardiovascular;  Laterality: N/A;  . Electrophysiologic study N/A 11/28/2014    Procedure: Cardioversion;  Surgeon: Teodoro Spray, MD;  Location: [facility] ORS;  Service: Cardiovascular;  Laterality: N/A;  Have discussed with Dr. Marcello Moores    PHYSICAL EXAM:  BP 115/62 mmHg  Pulse 52  Temp(Src) 98.3 F (36.8 C) (Oral)  Resp 16  Ht 5\' 9"  (1.753 m)  Wt 67.586 kg (149 lb)  BMI 21.99 kg/m2  SpO2 96%  Wt Readings from Last 3 Encounters:  05/01/15 67.586 kg (149 lb)   04/30/15 67.926 kg (149 lb 12 oz)  03/19/15 70.308 kg (155 lb)            Constitutional: NAD Neck: supple, no thyromegaly Respiratory: CTA, no rales or wheezes Cardiovascular: slow rate, regular rhythm, no murmur, no gallop Abdomen: soft, good BS, nontender Extremities: no edema Neuro: alert and oriented, no focal motor or sensory deficits  ASSESSMENT/PLAN:  Labs and imaging studies were reviewed  Will add po magnesium. Awaiting Cardiology consult. PT to evaluate. Repeat labs in AM.

## 2015-05-08 ENCOUNTER — Telehealth: Payer: Self-pay

## 2015-05-08 NOTE — Telephone Encounter (Signed)
Referral was sent on 04/30/15. Message sent to cardiac rehab inquiring on status.

## 2015-05-08 NOTE — Telephone Encounter (Signed)
Pt is calling to check status of her hearttrack. Please call.

## 2015-05-09 NOTE — Telephone Encounter (Signed)
Per Remo Lipps in cardiac rehab, they have received referral and will call pt to set up appt.

## 2015-05-21 ENCOUNTER — Ambulatory Visit (INDEPENDENT_AMBULATORY_CARE_PROVIDER_SITE_OTHER): Payer: PPO | Admitting: Internal Medicine

## 2015-05-21 ENCOUNTER — Encounter: Payer: Self-pay | Admitting: Internal Medicine

## 2015-05-21 VITALS — BP 142/104 | HR 71 | Ht 69.0 in | Wt 149.0 lb

## 2015-05-21 DIAGNOSIS — I471 Supraventricular tachycardia: Secondary | ICD-10-CM | POA: Diagnosis not present

## 2015-05-21 DIAGNOSIS — I480 Paroxysmal atrial fibrillation: Secondary | ICD-10-CM

## 2015-05-21 DIAGNOSIS — I4892 Unspecified atrial flutter: Secondary | ICD-10-CM

## 2015-05-21 DIAGNOSIS — I10 Essential (primary) hypertension: Secondary | ICD-10-CM | POA: Diagnosis not present

## 2015-05-21 MED ORDER — CARVEDILOL 3.125 MG PO TABS: 3.1250 mg | ORAL_TABLET | Freq: Two times a day (BID) | ORAL | Status: DC

## 2015-05-21 NOTE — Assessment & Plan Note (Signed)
She has had no obvious recurrence. I will ask her to continue the Eliquis.

## 2015-05-21 NOTE — Assessment & Plan Note (Signed)
Her blood pressure remains elevated. I have asked her to start low dose coreg.

## 2015-05-21 NOTE — Assessment & Plan Note (Signed)
She is s/p ablation with no recurrence. Will follow.

## 2015-05-21 NOTE — Assessment & Plan Note (Signed)
We discussed the treatment options. I have asked her to try to the low dose of coreg. We also discussed catheter ablation. If she has recurrent SVT, then ablation would be a strong consideration.

## 2015-05-21 NOTE — Progress Notes (Signed)
HPI Colleen Williamson returns today after being admitted for palpitations and having SVT and undergoing DCCV. She is a pleasant 69 yo woman with mitral valve disease who underwent mitral valve repair several months ago. She had post op atrial fib by her report and sustained a stroke. She had recurrent atrial flutter and several DC cardioversions. She underwent catheter ablation of typical atrial flutter several months ago. She has done well in the interim until presenting with palpitations. Her initial ECG demonstrated lots of atrial ectopy vs atypical flutter. She then developed a ?short RP tachycardia (difficult to know where the p waves were) and was cardioverted. She had been on flecainide prior but developed a metallic taste in her mouth. This has resolved with stopping the flecainide. She had some fatigue and had her bisoprolol stopped.  Allergies  Allergen Reactions  . Sulfa Antibiotics Nausea Only     Current Outpatient Prescriptions  Medication Sig Dispense Refill  . amLODipine (NORVASC) 5 MG tablet Take 5 mg by mouth daily.    Marland Kitchen apixaban (ELIQUIS) 5 MG TABS tablet Take 1 tablet (5 mg total) by mouth 2 (two) times daily. 60 tablet 2  . magnesium oxide (MAG-OX) 400 (241.3 MG) MG tablet Take 1 tablet (400 mg total) by mouth 2 (two) times daily. 60 tablet 5  . potassium chloride SA (K-DUR,KLOR-CON) 20 MEQ tablet Take 1 tablet (20 mEq total) by mouth 2 (two) times daily. 60 tablet 1  . sertraline (ZOLOFT) 50 MG tablet Take 50 mg by mouth at bedtime.      No current facility-administered medications for this visit.     Past Medical History  Diagnosis Date  . Abnormal mammogram, unspecified 2014  . Breast cyst   . Stroke ([place]) 07/03/2014  . Mitral valve disorder     mitral valve repair    ROS:   All systems reviewed and negative except as noted in the HPI.   Past Surgical History  Procedure Laterality Date  . Abdominal hysterectomy  2004  . Eye surgery  2007  . Mitral  valve repair  05/2014  . Radiology with anesthesia N/A 07/03/2014    Procedure: RADIOLOGY WITH ANESTHESIA;  Surgeon: Rob Hickman, MD;  Location: [place];  Service: Radiology;  Laterality: N/A;  . Cardioversion N/A 09/21/2014    Procedure: CARDIOVERSION;  Surgeon: Josue Hector, MD;  Location: [facility] ENDOSCOPY;  Service: Cardiovascular;  Laterality: N/A;  . Electrophysiologic study N/A 11/28/2014    Procedure: Cardioversion;  Surgeon: Teodoro Spray, MD;  Location: [place] CV LAB;  Service: Cardiovascular;  Laterality: N/A;  . Electrophysiologic study N/A 12/27/2014    Procedure: A-Flutter;  Surgeon: Evans Lance, MD;  Location: [place] [facility] LAB;  Service: Cardiovascular;  Laterality: N/A;  . Electrophysiologic study N/A 11/28/2014    Procedure: Cardioversion;  Surgeon: Teodoro Spray, MD;  Location: [facility] ORS;  Service: Cardiovascular;  Laterality: N/A;  Have discussed with Dr. Marcello Moores     Family History  Problem Relation Age of Onset  . Cancer      BOTH SIDES OF THE FAMILY  . Diabetes      MATERNAL SIDE OF THE FAMILY  . Cancer Father      Social History   Social History  . Marital Status: Widowed    Spouse Name: N/A  . Number of Children: 3  . Years of Education: 12+   Occupational History  . Not on file.   Social History Main Topics  .  Smoking status: Never Smoker   . Smokeless tobacco: Never Used     Comment: quit smoking in her 30's  . Alcohol Use: No  . Drug Use: No  . Sexual Activity: Not on file   Other Topics Concern  . Not on file   Social History Narrative   Patient is widowed with 3 children.   Patient is right handed.   Patient has hs education plus 1-2 yrs of college.   Patient drinks 1 cup daily.     BP 142/104 mmHg  Pulse 71  Ht 5\' 9"  (1.753 m)  Wt 149 lb (67.586 kg)  BMI 21.99 kg/m2  Physical Exam:  Well appearing 69 yo woman, NAD HEENT: Unremarkable Neck:  6 cm JVD, no thyromegally Back:  No CVA tenderness Lungs:  Clear with no  wheezes HEART:  Regular rate rhythm, no murmurs, no rubs, no clicks Abd:  soft, positive bowel sounds, no organomegally, no rebound, no guarding Ext:  2 plus pulses, no edema, no cyanosis, no clubbing Skin:  No rashes no nodules Neuro:  CN II through XII intact, motor grossly intact  EKG - sinus bradycardia with IRBBB Old ECG's - SVT as above   Assess/Plan:

## 2015-05-21 NOTE — Patient Instructions (Signed)
Medication Instructions:  Your physician has recommended you make the following change in your medication: 1) Start Carvedilol 3.125mg  twice daily   Labwork: None ordered   Testing/Procedures: None ordered   Follow-Up: Your physician recommends that you schedule a follow-up appointment in: 2 months with Dr Lovena Le   Any Other Special Instructions Will Be Listed Below (If Applicable).     If you need a refill on your cardiac medications before your next appointment, please call your pharmacy.  If you need a refill on your cardiac medications before your next appointment, please call your pharmacy.

## 2015-05-27 ENCOUNTER — Telehealth: Payer: Self-pay

## 2015-05-27 ENCOUNTER — Encounter: Payer: PPO | Attending: Cardiovascular Disease | Admitting: *Deleted

## 2015-05-27 VITALS — Ht 67.6 in | Wt 146.8 lb

## 2015-05-27 DIAGNOSIS — Z9889 Other specified postprocedural states: Secondary | ICD-10-CM | POA: Diagnosis present

## 2015-05-27 NOTE — Addendum Note (Signed)
Addended by: Joretta Bachelor on: 05/27/2015 04:42 PM   Modules accepted: Orders

## 2015-05-27 NOTE — Telephone Encounter (Signed)
Samples provided of eliquis 5 mg.

## 2015-05-27 NOTE — Patient Instructions (Signed)
Patient Instructions  Patient Details  Name: Colleen Williamson MRN: [id number] Date of Birth: [birth year] Referring Provider:  Minna Merritts, MD  Below are the personal goals you chose as well as exercise and nutrition goals. Our goal is to help you keep on track towards obtaining and maintaining your goals. We will be discussing your progress on these goals with you throughout the program.  Initial Exercise Prescription:   Exercise Goals: Frequency: Be able to perform aerobic exercise three times per week working toward 3-5 days per week.  Intensity: Work with a perceived exertion of 11 (fairly light) - 15 (hard) as tolerated. Follow your new exercise prescription and watch for changes in prescription as you progress with the program. Changes will be reviewed with you when they are made.  Duration: You should be able to do 30 minutes of continuous aerobic exercise in addition to a 5 minute warm-up and a 5 minute cool-down routine.  Nutrition Goals: Your personal nutrition goals will be established when you do your nutrition analysis with the dietician.  The following are nutrition guidelines to follow: Cholesterol < 200mg /day Sodium < 1500mg /day Fiber: Women over 50 yrs - 21 grams per day  Personal Goals:     Personal Goals and Risk Factors at Admission - 05/27/15 1504    Personal Goals and Risk Factors on Admission   Diabetes No   Hypertension Yes   Goal Participant will see blood pressure controlled within the values of 140/96mm/Hg or within value directed by their physician.   Intervention Provide nutrition & aerobic exercise along with prescribed medications to achieve BP 140/90 or less.   Lipids No   Stress Yes   Goal To meet with psychosocial counselor for stress and relaxation information and guidance. To state understanding of performing relaxation techniques and or identifying personal stressors.  [name] reports her 2 granchldren and her daughter have lived with  her for 2 years now.    Intervention Provide education on types of stress, identifiying stressors, and ways to cope with stress. Provide demonstration and active practice of relaxation techniques.      Tobacco Use Initial Evaluation: History  Smoking status  . Never Smoker   Smokeless tobacco  . Never Used    Comment: quit smoking in her 30's    Copy of goals given to participant.

## 2015-05-27 NOTE — Progress Notes (Signed)
Cardiac Individual Treatment Plan  Patient Details  Name: Colleen Williamson MRN: [id number] Date of Birth: 1945/09/24 Referring Provider:  Minna Merritts, MD  Initial Encounter Date: Date: 05/27/15  Visit Diagnosis: S/P MVR (mitral valve repair) - Plan: CARDIAC REHAB 30 DAY REVIEW  Patient's Home Medications on Admission:  Current outpatient prescriptions:  .  amLODipine (NORVASC) 5 MG tablet, Take 5 mg by mouth daily., Disp: , Rfl:  .  apixaban (ELIQUIS) 5 MG TABS tablet, Take 1 tablet (5 mg total) by mouth 2 (two) times daily., Disp: 60 tablet, Rfl: 2 .  carvedilol (COREG) 3.125 MG tablet, Take 1 tablet (3.125 mg total) by mouth 2 (two) times daily., Disp: 180 tablet, Rfl: 3 .  magnesium oxide (MAG-OX) 400 (241.3 MG) MG tablet, Take 1 tablet (400 mg total) by mouth 2 (two) times daily., Disp: 60 tablet, Rfl: 5 .  potassium chloride SA (K-DUR,KLOR-CON) 20 MEQ tablet, Take 1 tablet (20 mEq total) by mouth 2 (two) times daily., Disp: 60 tablet, Rfl: 1 .  sertraline (ZOLOFT) 50 MG tablet, Take 50 mg by mouth at bedtime. , Disp: , Rfl:   Past Medical History: Past Medical History  Diagnosis Date  . Abnormal mammogram, unspecified 2014  . Breast cyst   . Stroke ([place]) 07/03/2014  . Mitral valve disorder     mitral valve repair    Tobacco Use: History  Smoking status  . Never Smoker   Smokeless tobacco  . Never Used    Comment: quit smoking in her 30's    Labs: Recent Review Flowsheet Data    Labs for ITP Cardiac and Pulmonary Rehab Latest Ref Rng 07/03/2014 07/03/2014 07/03/2014 07/04/2014   Cholestrol 0 - 200 mg/dL - - - 132   LDLCALC 0 - 99 mg/dL - - - 55   HDL >39 mg/dL - - - 46   Trlycerides <150 mg/dL - 148 - 153(H)   Hemoglobin A1c <5.7 % - - - 5.6   PHART 7.350 - 7.450 - - 7.378 -   PCO2ART 35.0 - 45.0 mmHg - - 33.0(L) -   HCO3 20.0 - 24.0 mEq/L - - 19.4(L) -   TCO2 0 - 100 mmol/L 21 - 20 -   ACIDBASEDEF 0.0 - 2.0 mmol/L - - 5.0(H) -   O2SAT - - - 98.0 -        Exercise Target Goals: Date: 05/27/15  Exercise Program Goal: Individual exercise prescription set with THRR, safety & activity barriers. Participant demonstrates ability to understand and report RPE using BORG scale, to self-measure pulse accurately, and to acknowledge the importance of the exercise prescription.  Exercise Prescription Goal: Starting with aerobic activity 30 plus minutes a day, 3 days per week for initial exercise prescription. Provide home exercise prescription and guidelines that participant acknowledges understanding prior to discharge.  Activity Barriers & Risk Stratification:     Activity Barriers & Risk Stratification - 05/27/15 1353    Activity Barriers & Risk Stratification   Activity Barriers None   Risk Stratification Moderate      6 Minute Walk:     6 Minute Walk      05/27/15 1638       6 Minute Walk   Phase Initial     Distance 1435 feet     Walk Time 6 minutes     Resting HR 77 bpm     Resting BP 110/80 mmHg     Max Ex. HR 85 bpm     Max  Ex. BP 120/72 mmHg     RPE 11     Symptoms No        Initial Exercise Prescription:     Initial Exercise Prescription - 05/27/15 1600    Date of Initial Exercise Prescription   Date 05/27/15   Treadmill   MPH 2   Grade 0   Minutes 10   Recumbant Bike   Level 2   RPM 40   Watts 20   Minutes 10   NuStep   Level 2   Watts 40   Minutes 10   Arm Ergometer   Level 1   Watts 10   Minutes 10   REL-XR   Level 3   Watts 50   Minutes 10   Prescription Details   Frequency (times per week) 3   Duration Progress to 30 minutes of continuous aerobic without signs/symptoms of physical distress   Intensity   THRR REST +  30   Ratings of Perceived Exertion 11-15   Perceived Dyspnea 2-4   Progression Continue progressive overload as per policy without signs/symptoms or physical distress.   Resistance Training   Training Prescription Yes   Weight 2   Reps 10-15      Exercise  Prescription Changes:   Discharge Exercise Prescription (Final Exercise Prescription Changes):   Nutrition:  Target Goals: Understanding of nutrition guidelines, daily intake of sodium 1500mg , cholesterol 200mg , calories 30% from fat and 7% or less from saturated fats, daily to have 5 or more servings of fruits and vegetables.  Biometrics:     Pre Biometrics - 05/27/15 1640    Pre Biometrics   Height 5' 7.6" (1.717 m)   Weight 146 lb 12.8 oz (66.588 kg)   Waist Circumference 29 inches   Hip Circumference 40.5 inches   Waist to Hip Ratio 0.72 %   BMI (Calculated) 22.6       Nutrition Therapy Plan and Nutrition Goals:     Nutrition Therapy & Goals - 05/27/15 1529    Nutrition Therapy   Drug/Food Interactions --  Prefers not to meet with the dietician.       Nutrition Discharge: Rate Your Plate Scores:   Nutrition Goals Re-Evaluation:   Psychosocial: Target Goals: Acknowledge presence or absence of depression, maximize coping skills, provide positive support system. Participant is able to verbalize types and ability to use techniques and skills needed for reducing stress and depression.  Initial Review & Psychosocial Screening:     Initial Psych Review & Screening - 05/27/15 1506    Initial Review   Current issues with Current Stress Concerns   Family Dynamics   Good Support System? No   Concerns Recent loss of significant other;No support system   Barriers   Psychosocial barriers to participate in program The patient should benefit from training in stress management and relaxation.   Screening Interventions   Interventions Encouraged to exercise;Program counselor consult      Quality of Life Scores:     Quality of Life - 05/27/15 1529    Quality of Life Scores   Health/Function Pre 14.31 %   Socioeconomic Pre 20 %   Psych/Spiritual Pre 12 %   Family Pre 25.5 %   GLOBAL Pre 16.4 %      PHQ-9:     Recent Review Flowsheet Data    Depression  screen PHQ 2/9 05/27/2015   Down, Depressed, Hopeless 3   PHQ - 2 Score 3   Altered sleeping 3  Tired, decreased energy 3   Change in appetite 3   Feeling bad or failure about yourself  3   Trouble concentrating 3   Moving slowly or fidgety/restless 0   Suicidal thoughts 0   PHQ-9 Score 18   Difficult doing work/chores Very difficult      Psychosocial Evaluation and Intervention:   Psychosocial Re-Evaluation:   Vocational Rehabilitation: Provide vocational rehab assistance to qualifying candidates.   Vocational Rehab Evaluation & Intervention:     Vocational Rehab - 05/27/15 1504    Initial Vocational Rehab Evaluation & Intervention   Assessment shows need for Vocational Rehabilitation No      Education: Education Goals: Education classes will be provided on a weekly basis, covering required topics. Participant will state understanding/return demonstration of topics presented.  Learning Barriers/Preferences:     Learning Barriers/Preferences - 05/27/15 1504    Learning Barriers/Preferences   Learning Barriers Sight      Education Topics: General Nutrition Guidelines/Fats and Fiber: -Group instruction provided by verbal, written material, models and posters to present the general guidelines for heart healthy nutrition. Gives an explanation and review of dietary fats and fiber.   Controlling Sodium/Reading Food Labels: -Group verbal and written material supporting the discussion of sodium use in heart healthy nutrition. Review and explanation with models, verbal and written materials for utilization of the food label.   Exercise Physiology & Risk Factors: - Group verbal and written instruction with models to review the exercise physiology of the cardiovascular system and associated critical values. Details cardiovascular disease risk factors and the goals associated with each risk factor.   Aerobic Exercise & Resistance Training: - Gives group verbal and  written discussion on the health impact of inactivity. On the components of aerobic and resistive training programs and the benefits of this training and how to safely progress through these programs.   Flexibility, Balance, General Exercise Guidelines: - Provides group verbal and written instruction on the benefits of flexibility and balance training programs. Provides general exercise guidelines with specific guidelines to those with heart or lung disease. Demonstration and skill practice provided.   Stress Management: - Provides group verbal and written instruction about the health risks of elevated stress, cause of high stress, and healthy ways to reduce stress.   Depression: - Provides group verbal and written instruction on the correlation between heart/lung disease and depressed mood, treatment options, and the stigmas associated with seeking treatment.   Anatomy & Physiology of the Heart: - Group verbal and written instruction and models provide basic cardiac anatomy and physiology, with the coronary electrical and arterial systems. Review of: AMI, Angina, Valve disease, Heart Failure, Cardiac Arrhythmia, Pacemakers, and the ICD.   Cardiac Procedures: - Group verbal and written instruction and models to describe the testing methods done to diagnose heart disease. Reviews the outcomes of the test results. Describes the treatment choices: Medical Management, Angioplasty, or Coronary Bypass Surgery.   Cardiac Medications: - Group verbal and written instruction to review commonly prescribed medications for heart disease. Reviews the medication, class of the drug, and side effects. Includes the steps to properly store meds and maintain the prescription regimen.   Go Sex-Intimacy & Heart Disease, Get SMART - Goal Setting: - Group verbal and written instruction through game format to discuss heart disease and the return to sexual intimacy. Provides group verbal and written material to  discuss and apply goal setting through the application of the S.M.A.R.T. Method.   Other Matters of the Heart: - Provides  group verbal, written materials and models to describe Heart Failure, Angina, Valve Disease, and Diabetes in the realm of heart disease. Includes description of the disease process and treatment options available to the cardiac patient.   Exercise & Equipment Safety: - Individual verbal instruction and demonstration of equipment use and safety with use of the equipment.          Cardiac Rehab from 05/27/2015 in [facility] Cardiac Rehab   Date  05/27/15   Educator  C. [place],RN   Instruction Review Code  1- partially meets, needs review/practice      Infection Prevention: - Provides verbal and written material to individual with discussion of infection control including proper hand washing and proper equipment cleaning during exercise session.      Cardiac Rehab from 05/27/2015 in [facility] Cardiac Rehab   Date  05/27/15   Educator  C. [place]   Instruction Review Code  1- partially meets, needs review/practice      Falls Prevention: - Provides verbal and written material to individual with discussion of falls prevention and safety.      Cardiac Rehab from 05/27/2015 in [facility] Cardiac Rehab   Date  05/27/15   Educator  C. Enterkin,   Instruction Review Code  2- meets goals/outcomes      Diabetes: - Individual verbal and written instruction to review signs/symptoms of diabetes, desired ranges of glucose level fasting, after meals and with exercise. Advice that pre and post exercise glucose checks will be done for 3 sessions at entry of program.    Knowledge Questionnaire Score:     Knowledge Questionnaire Score - 05/27/15 1503    Knowledge Questionnaire Score   Pre Score 22      Personal Goals and Risk Factors at Admission:     Personal Goals and Risk Factors at Admission - 05/27/15 1504    Personal Goals and Risk Factors on Admission   Diabetes No    Hypertension Yes   Goal Participant will see blood pressure controlled within the values of 140/68mm/Hg or within value directed by their physician.   Intervention Provide nutrition & aerobic exercise along with prescribed medications to achieve BP 140/90 or less.   Lipids No   Stress Yes   Goal To meet with psychosocial counselor for stress and relaxation information and guidance. To state understanding of performing relaxation techniques and or identifying personal stressors.  Lekeya reports her 2 granchldren and her daughter have lived with her for 2 years now.    Intervention Provide education on types of stress, identifiying stressors, and ways to cope with stress. Provide demonstration and active practice of relaxation techniques.      Personal Goals and Risk Factors Review:    Personal Goals Discharge (Final Personal Goals and Risk Factors Review):     Comments:

## 2015-05-27 NOTE — Progress Notes (Signed)
Cardiac Individual Treatment Plan  Patient Details  Name: Colleen Williamson MRN: [id number] Date of Birth: Dec 08, 1945 Referring Provider:  Minna Merritts, MD  Initial Encounter Date:    Visit Diagnosis: S/P MVR (mitral valve repair)  Patient's Home Medications on Admission:  Current outpatient prescriptions:  .  amLODipine (NORVASC) 5 MG tablet, Take 5 mg by mouth daily., Disp: , Rfl:  .  apixaban (ELIQUIS) 5 MG TABS tablet, Take 1 tablet (5 mg total) by mouth 2 (two) times daily., Disp: 60 tablet, Rfl: 2 .  carvedilol (COREG) 3.125 MG tablet, Take 1 tablet (3.125 mg total) by mouth 2 (two) times daily., Disp: 180 tablet, Rfl: 3 .  magnesium oxide (MAG-OX) 400 (241.3 MG) MG tablet, Take 1 tablet (400 mg total) by mouth 2 (two) times daily., Disp: 60 tablet, Rfl: 5 .  potassium chloride SA (K-DUR,KLOR-CON) 20 MEQ tablet, Take 1 tablet (20 mEq total) by mouth 2 (two) times daily., Disp: 60 tablet, Rfl: 1 .  sertraline (ZOLOFT) 50 MG tablet, Take 50 mg by mouth at bedtime. , Disp: , Rfl:   Past Medical History: Past Medical History  Diagnosis Date  . Abnormal mammogram, unspecified 2014  . Breast cyst   . Stroke ([place]) 07/03/2014  . Mitral valve disorder     mitral valve repair    Tobacco Use: History  Smoking status  . Never Smoker   Smokeless tobacco  . Never Used    Comment: quit smoking in her 53's    Labs: Recent Review Flowsheet Data    Labs for ITP Cardiac and Pulmonary Rehab Latest Ref Rng 07/03/2014 07/03/2014 07/03/2014 07/04/2014   Cholestrol 0 - 200 mg/dL - - - 132   LDLCALC 0 - 99 mg/dL - - - 55   HDL >39 mg/dL - - - 46   Trlycerides <150 mg/dL - 148 - 153(H)   Hemoglobin A1c <5.7 % - - - 5.6   PHART 7.350 - 7.450 - - 7.378 -   PCO2ART 35.0 - 45.0 mmHg - - 33.0(L) -   HCO3 20.0 - 24.0 mEq/L - - 19.4(L) -   TCO2 0 - 100 mmol/L 21 - 20 -   ACIDBASEDEF 0.0 - 2.0 mmol/L - - 5.0(H) -   O2SAT - - - 98.0 -       Exercise Target Goals:    Exercise Program  Goal: Individual exercise prescription set with THRR, safety & activity barriers. Participant demonstrates ability to understand and report RPE using BORG scale, to self-measure pulse accurately, and to acknowledge the importance of the exercise prescription.  Exercise Prescription Goal: Starting with aerobic activity 30 plus minutes a day, 3 days per week for initial exercise prescription. Provide home exercise prescription and guidelines that participant acknowledges understanding prior to discharge.  Activity Barriers & Risk Stratification:     Activity Barriers & Risk Stratification - 05/27/15 1353    Activity Barriers & Risk Stratification   Activity Barriers None   Risk Stratification Moderate      6 Minute Walk:   Initial Exercise Prescription:   Exercise Prescription Changes:   Discharge Exercise Prescription (Final Exercise Prescription Changes):   Nutrition:  Target Goals: Understanding of nutrition guidelines, daily intake of sodium 1500mg , cholesterol 200mg , calories 30% from fat and 7% or less from saturated fats, daily to have 5 or more servings of fruits and vegetables.  Biometrics:    Nutrition Therapy Plan and Nutrition Goals:     Nutrition Therapy & Goals - 05/27/15  1504    Nutrition Therapy   Drug/Food Interactions Statins/Certain Fruits   Intervention Plan   Intervention Using nutrition plan and personal goals to gain a healthy nutrition lifestyle. Add exercise as prescribed.      Nutrition Discharge: Rate Your Plate Scores:   Nutrition Goals Re-Evaluation:   Psychosocial: Target Goals: Acknowledge presence or absence of depression, maximize coping skills, provide positive support system. Participant is able to verbalize types and ability to use techniques and skills needed for reducing stress and depression.  Initial Review & Psychosocial Screening:     Initial Psych Review & Screening - 05/27/15 1506    Initial Review   Current  issues with Current Stress Concerns   Family Dynamics   Good Support System? No   Concerns Recent loss of significant other;No support system   Barriers   Psychosocial barriers to participate in program The patient should benefit from training in stress management and relaxation.   Screening Interventions   Interventions Encouraged to exercise;Program counselor consult      Quality of Life Scores:   PHQ-9:     Recent Review Flowsheet Data    Depression screen PHQ 2/9 05/27/2015   Down, Depressed, Hopeless 3   PHQ - 2 Score 3   Altered sleeping 3   Tired, decreased energy 3   Change in appetite 3   Feeling bad or failure about yourself  3   Trouble concentrating 3   Moving slowly or fidgety/restless 0   Suicidal thoughts 0   PHQ-9 Score 18   Difficult doing work/chores Very difficult      Psychosocial Evaluation and Intervention:   Psychosocial Re-Evaluation:   Vocational Rehabilitation: Provide vocational rehab assistance to qualifying candidates.   Vocational Rehab Evaluation & Intervention:     Vocational Rehab - 05/27/15 1504    Initial Vocational Rehab Evaluation & Intervention   Assessment shows need for Vocational Rehabilitation No      Education: Education Goals: Education classes will be provided on a weekly basis, covering required topics. Participant will state understanding/return demonstration of topics presented.  Learning Barriers/Preferences:     Learning Barriers/Preferences - 05/27/15 1504    Learning Barriers/Preferences   Learning Barriers Sight      Education Topics: General Nutrition Guidelines/Fats and Fiber: -Group instruction provided by verbal, written material, models and posters to present the general guidelines for heart healthy nutrition. Gives an explanation and review of dietary fats and fiber.   Controlling Sodium/Reading Food Labels: -Group verbal and written material supporting the discussion of sodium use in heart  healthy nutrition. Review and explanation with models, verbal and written materials for utilization of the food label.   Exercise Physiology & Risk Factors: - Group verbal and written instruction with models to review the exercise physiology of the cardiovascular system and associated critical values. Details cardiovascular disease risk factors and the goals associated with each risk factor.   Aerobic Exercise & Resistance Training: - Gives group verbal and written discussion on the health impact of inactivity. On the components of aerobic and resistive training programs and the benefits of this training and how to safely progress through these programs.   Flexibility, Balance, General Exercise Guidelines: - Provides group verbal and written instruction on the benefits of flexibility and balance training programs. Provides general exercise guidelines with specific guidelines to those with heart or lung disease. Demonstration and skill practice provided.   Stress Management: - Provides group verbal and written instruction about the health risks of elevated  stress, cause of high stress, and healthy ways to reduce stress.   Depression: - Provides group verbal and written instruction on the correlation between heart/lung disease and depressed mood, treatment options, and the stigmas associated with seeking treatment.   Anatomy & Physiology of the Heart: - Group verbal and written instruction and models provide basic cardiac anatomy and physiology, with the coronary electrical and arterial systems. Review of: AMI, Angina, Valve disease, Heart Failure, Cardiac Arrhythmia, Pacemakers, and the ICD.   Cardiac Procedures: - Group verbal and written instruction and models to describe the testing methods done to diagnose heart disease. Reviews the outcomes of the test results. Describes the treatment choices: Medical Management, Angioplasty, or Coronary Bypass Surgery.   Cardiac Medications: -  Group verbal and written instruction to review commonly prescribed medications for heart disease. Reviews the medication, class of the drug, and side effects. Includes the steps to properly store meds and maintain the prescription regimen.   Go Sex-Intimacy & Heart Disease, Get SMART - Goal Setting: - Group verbal and written instruction through game format to discuss heart disease and the return to sexual intimacy. Provides group verbal and written material to discuss and apply goal setting through the application of the S.M.A.R.T. Method.   Other Matters of the Heart: - Provides group verbal, written materials and models to describe Heart Failure, Angina, Valve Disease, and Diabetes in the realm of heart disease. Includes description of the disease process and treatment options available to the cardiac patient.   Exercise & Equipment Safety: - Individual verbal instruction and demonstration of equipment use and safety with use of the equipment.          Cardiac Rehab from 05/27/2015 in [facility] Cardiac Rehab   Date  05/27/15   Educator  C. Dave Mergen,RN   Instruction Review Code  1- partially meets, needs review/practice      Infection Prevention: - Provides verbal and written material to individual with discussion of infection control including proper hand washing and proper equipment cleaning during exercise session.      Cardiac Rehab from 05/27/2015 in [facility] Cardiac Rehab   Date  05/27/15   Educator  C. [place]   Instruction Review Code  1- partially meets, needs review/practice      Falls Prevention: - Provides verbal and written material to individual with discussion of falls prevention and safety.      Cardiac Rehab from 05/27/2015 in [facility] Cardiac Rehab   Date  05/27/15   Educator  C. Leisha Trinkle,   Instruction Review Code  2- meets goals/outcomes      Diabetes: - Individual verbal and written instruction to review signs/symptoms of diabetes, desired ranges of glucose level  fasting, after meals and with exercise. Advice that pre and post exercise glucose checks will be done for 3 sessions at entry of program.    Knowledge Questionnaire Score:     Knowledge Questionnaire Score - 05/27/15 1503    Knowledge Questionnaire Score   Pre Score 22      Personal Goals and Risk Factors at Admission:     Personal Goals and Risk Factors at Admission - 05/27/15 1504    Personal Goals and Risk Factors on Admission   Diabetes No   Hypertension Yes   Goal Participant will see blood pressure controlled within the values of 140/34mm/Hg or within value directed by their physician.   Intervention Provide nutrition & aerobic exercise along with prescribed medications to achieve BP 140/90 or less.   Lipids No  Stress Yes   Goal To meet with psychosocial counselor for stress and relaxation information and guidance. To state understanding of performing relaxation techniques and or identifying personal stressors.  Colleen Williamson reports her 2 granchldren and her daughter have lived with her for 2 years now.    Intervention Provide education on types of stress, identifiying stressors, and ways to cope with stress. Provide demonstration and active practice of relaxation techniques.      Personal Goals and Risk Factors Review:    Personal Goals Discharge (Final Personal Goals and Risk Factors Review):     Comments: Stressful since her daughter and her 2 granchildren live with her. PHQ9 score is 21. Alean said [employer] didn't help her. Went to a counselor once but she reported he talked more about himself.

## 2015-05-27 NOTE — Telephone Encounter (Signed)
Pt would like Eliquis samples.  

## 2015-05-28 ENCOUNTER — Encounter: Payer: PPO | Attending: Cardiovascular Disease

## 2015-05-28 DIAGNOSIS — Z9889 Other specified postprocedural states: Secondary | ICD-10-CM | POA: Diagnosis present

## 2015-05-28 NOTE — Progress Notes (Signed)
Daily Session Note  Patient Details  Name: Colleen Williamson MRN: [id number] Date of Birth: [birth year] Referring Provider:  Minna Merritts, MD  Encounter Date: 05/28/2015  Check In:     Session Check In - 05/28/15 1000    Check-In   Staff Present Candiss Norse MS, ACSM CEP Exercise Physiologist;Other;Diane Mariana Arn, BSN   ER physicians immediately available to respond to emergencies See telemetry face sheet for immediately available ER MD   Medication changes reported     No   Fall or balance concerns reported    No   Warm-up and Cool-down Performed on first and last piece of equipment   VAD Patient? No   Pain Assessment   Currently in Pain? No/denies         Goals Met:  Exercise tolerated well No report of cardiac concerns or symptoms Strength training completed today  Goals Unmet:  Not Applicable  Goals Comments:    Dr. Emily Filbert is Medical Director for [facility].

## 2015-05-30 ENCOUNTER — Ambulatory Visit (INDEPENDENT_AMBULATORY_CARE_PROVIDER_SITE_OTHER): Payer: PPO

## 2015-05-30 ENCOUNTER — Telehealth: Payer: Self-pay | Admitting: *Deleted

## 2015-05-30 DIAGNOSIS — I471 Supraventricular tachycardia, unspecified: Secondary | ICD-10-CM

## 2015-05-30 DIAGNOSIS — Z9889 Other specified postprocedural states: Secondary | ICD-10-CM

## 2015-05-30 LAB — EXERCISE TOLERANCE TEST
CSEPED: 6 min
CSEPEW: 8.3 METS
CSEPPHR: 131 {beats}/min
Exercise duration (sec): 54 s
MPHR: 153 {beats}/min
Percent HR: 85 %
Rest HR: 80 {beats}/min

## 2015-05-30 NOTE — Telephone Encounter (Signed)
Pt is coming for stress test today at 2 pm and is asking for samples on Elquis 5 mg She is in donut hole  Please advice

## 2015-05-30 NOTE — Progress Notes (Signed)
Daily Session Note  Patient Details  Name: Colleen Williamson MRN: [id number] Date of Birth: 19-Jan-1946 Referring Provider:  Minna Merritts, MD  Encounter Date: 05/30/2015  Check In:     Session Check In - 05/30/15 0854    Check-In   Staff Present Lestine Box BS, ACSM EP-C, Exercise Physiologist;Carroll Enterkin RN, BSN;Other   ER physicians immediately available to respond to emergencies See telemetry face sheet for immediately available ER MD   Medication changes reported     No   Fall or balance concerns reported    No   Warm-up and Cool-down Performed on first and last piece of equipment   VAD Patient? No   Pain Assessment   Currently in Pain? No/denies         Goals Met:  Proper associated with RPD/PD & O2 Sat Exercise tolerated well No report of cardiac concerns or symptoms Strength training completed today  Goals Unmet:  Not Applicable  Goals Comments:   Dr. Emily Filbert is Medical Director for [facility].

## 2015-05-30 NOTE — Telephone Encounter (Signed)
LMOM that samples of Eliquis 5 mg are available at the front desk. Eliquis 5 mg LOT #  [id number] Exp. 07/2017

## 2015-06-04 DIAGNOSIS — Z9889 Other specified postprocedural states: Secondary | ICD-10-CM | POA: Diagnosis not present

## 2015-06-04 NOTE — Progress Notes (Signed)
Daily Session Note  Patient Details  Name: Colleen Williamson MRN: [id number] Date of Birth: 1945/12/17 Referring Provider:  Minna Merritts, MD  Encounter Date: 06/04/2015  Check In:     Session Check In - 06/04/15 0854    Check-In   Staff Present Candiss Norse MS, ACSM CEP Exercise Physiologist;Other;Diane Mariana Arn, BSN   ER physicians immediately available to respond to emergencies See telemetry face sheet for immediately available ER MD   Medication changes reported     No   Fall or balance concerns reported    No   Warm-up and Cool-down Performed on first and last piece of equipment   VAD Patient? No   Pain Assessment   Currently in Pain? No/denies         Goals Met:  Independence with exercise equipment Exercise tolerated well No report of cardiac concerns or symptoms Strength training completed today  Goals Unmet:  Not Applicable  Goals Comments:    Dr. Emily Filbert is Medical Director for [facility].

## 2015-06-05 NOTE — Addendum Note (Signed)
Addended by: Gerlene Burdock on: 06/05/2015 09:40 AM   Modules accepted: Orders

## 2015-06-05 NOTE — Progress Notes (Signed)
Cardiac Individual Treatment Plan  Patient Details  Name: Colleen Williamson MRN: [id number] Date of Birth: [birth year] Referring Provider:  Minna Merritts, MD  Initial Encounter Date: 05/27/2015 Visit Diagnosis: S/P MVR (mitral valve repair)  Patient's Home Medications on Admission:  Current outpatient prescriptions:  .  amLODipine (NORVASC) 5 MG tablet, Take 5 mg by mouth daily., Disp: , Rfl:  .  apixaban (ELIQUIS) 5 MG TABS tablet, Take 1 tablet (5 mg total) by mouth 2 (two) times daily., Disp: 60 tablet, Rfl: 2 .  carvedilol (COREG) 3.125 MG tablet, Take 1 tablet (3.125 mg total) by mouth 2 (two) times daily., Disp: 180 tablet, Rfl: 3 .  magnesium oxide (MAG-OX) 400 (241.3 MG) MG tablet, Take 1 tablet (400 mg total) by mouth 2 (two) times daily., Disp: 60 tablet, Rfl: 5 .  potassium chloride SA (K-DUR,KLOR-CON) 20 MEQ tablet, Take 1 tablet (20 mEq total) by mouth 2 (two) times daily., Disp: 60 tablet, Rfl: 1 .  sertraline (ZOLOFT) 50 MG tablet, Take 50 mg by mouth at bedtime. , Disp: , Rfl:   Past Medical History: Past Medical History  Diagnosis Date  . Abnormal mammogram, unspecified 2014  . Breast cyst   . Stroke ([place]) 07/03/2014  . Mitral valve disorder     mitral valve repair    Tobacco Use: History  Smoking status  . Never Smoker   Smokeless tobacco  . Never Used    Comment: quit smoking in her 70's    Labs: Recent Review Flowsheet Data    Labs for ITP Cardiac and Pulmonary Rehab Latest Ref Rng 07/03/2014 07/03/2014 07/03/2014 07/04/2014   Cholestrol 0 - 200 mg/dL - - - 132   LDLCALC 0 - 99 mg/dL - - - 55   HDL >39 mg/dL - - - 46   Trlycerides <150 mg/dL - 148 - 153(H)   Hemoglobin A1c <5.7 % - - - 5.6   PHART 7.350 - 7.450 - - 7.378 -   PCO2ART 35.0 - 45.0 mmHg - - 33.0(L) -   HCO3 20.0 - 24.0 mEq/L - - 19.4(L) -   TCO2 0 - 100 mmol/L 21 - 20 -   ACIDBASEDEF 0.0 - 2.0 mmol/L - - 5.0(H) -   O2SAT - - - 98.0 -       Exercise Target Goals:    Exercise  Program Goal: Individual exercise prescription set with THRR, safety & activity barriers. Participant demonstrates ability to understand and report RPE using BORG scale, to self-measure pulse accurately, and to acknowledge the importance of the exercise prescription.  Exercise Prescription Goal: Starting with aerobic activity 30 plus minutes a day, 3 days per week for initial exercise prescription. Provide home exercise prescription and guidelines that participant acknowledges understanding prior to discharge.  Activity Barriers & Risk Stratification:     Activity Barriers & Risk Stratification - 05/27/15 1353    Activity Barriers & Risk Stratification   Activity Barriers None   Risk Stratification Moderate      6 Minute Walk:     6 Minute Walk      05/27/15 1638       6 Minute Walk   Phase Initial     Distance 1435 feet     Walk Time 6 minutes     Resting HR 77 bpm     Resting BP 110/80 mmHg     Max Ex. HR 85 bpm     Max Ex. BP 120/72 mmHg     RPE  11     Symptoms No        Initial Exercise Prescription:     Initial Exercise Prescription - 05/27/15 1600    Date of Initial Exercise Prescription   Date 05/27/15   Treadmill   MPH 2   Grade 0   Minutes 10   Recumbant Bike   Level 2   RPM 40   Watts 20   Minutes 10   NuStep   Level 2   Watts 40   Minutes 10   Arm Ergometer   Level 1   Watts 10   Minutes 10   REL-XR   Level 3   Watts 50   Minutes 10   Prescription Details   Frequency (times per week) 3   Duration Progress to 30 minutes of continuous aerobic without signs/symptoms of physical distress   Intensity   THRR REST +  30   Ratings of Perceived Exertion 11-15   Perceived Dyspnea 2-4   Progression Continue progressive overload as per policy without signs/symptoms or physical distress.   Resistance Training   Training Prescription Yes   Weight 2   Reps 10-15      Exercise Prescription Changes:     Exercise Prescription Changes       06/04/15 1200           Exercise Review   Progression No       Response to Exercise   Blood Pressure (Admit) 110/72 mmHg       Blood Pressure (Exercise) 130/74 mmHg       Blood Pressure (Exit) 106/66 mmHg       Heart Rate (Admit) 77 bpm       Heart Rate (Exercise) 92 bpm       Heart Rate (Exit) 69 bpm       Rating of Perceived Exertion (Exercise) 11       Symptoms no       Duration Progress to 30 minutes of continuous aerobic without signs/symptoms of physical distress       Intensity Rest + 30       Progression Continue progressive overload as per policy without signs/symptoms or physical distress.       Resistance Training   Training Prescription Yes       Weight 2       Reps 10-15       Interval Training   Interval Training No       Treadmill   MPH 3       Grade 0       Minutes 15       NuStep   Level 5       Watts 40       Minutes 15          Discharge Exercise Prescription (Final Exercise Prescription Changes):     Exercise Prescription Changes - 06/04/15 1200    Exercise Review   Progression No   Response to Exercise   Blood Pressure (Admit) 110/72 mmHg   Blood Pressure (Exercise) 130/74 mmHg   Blood Pressure (Exit) 106/66 mmHg   Heart Rate (Admit) 77 bpm   Heart Rate (Exercise) 92 bpm   Heart Rate (Exit) 69 bpm   Rating of Perceived Exertion (Exercise) 11   Symptoms no   Duration Progress to 30 minutes of continuous aerobic without signs/symptoms of physical distress   Intensity Rest + 30   Progression Continue progressive overload as per policy without signs/symptoms or physical  distress.   Resistance Training   Training Prescription Yes   Weight 2   Reps 10-15   Interval Training   Interval Training No   Treadmill   MPH 3   Grade 0   Minutes 15   NuStep   Level 5   Watts 40   Minutes 15      Nutrition:  Target Goals: Understanding of nutrition guidelines, daily intake of sodium <1525m, cholesterol <2025m calories 30% from fat and 7%  or less from saturated fats, daily to have 5 or more servings of fruits and vegetables.  Biometrics:     Pre Biometrics - 05/27/15 1640    Pre Biometrics   Height 5' 7.6" (1.717 m)   Weight 146 lb 12.8 oz (66.588 kg)   Waist Circumference 29 inches   Hip Circumference 40.5 inches   Waist to Hip Ratio 0.72 %   BMI (Calculated) 22.6       Nutrition Therapy Plan and Nutrition Goals:     Nutrition Therapy & Goals - 05/31/15 1441    Personal Nutrition Goals   Comments Patient prefers not to meet with dietitian at this time.      Nutrition Discharge: Rate Your Plate Scores:     Rate Your Plate - 1137/62/8351517  Rate Your Plate Scores   Pre Score 57   Pre Score % 63 %      Nutrition Goals Re-Evaluation:   Psychosocial: Target Goals: Acknowledge presence or absence of depression, maximize coping skills, provide positive support system. Participant is able to verbalize types and ability to use techniques and skills needed for reducing stress and depression.  Initial Review & Psychosocial Screening:     Initial Psych Review & Screening - 05/27/15 1506    Initial Review   Current issues with Current Stress Concerns   Family Dynamics   Good Support System? No   Concerns Recent loss of significant other;No support system   Barriers   Psychosocial barriers to participate in program The patient should benefit from training in stress management and relaxation.   Screening Interventions   Interventions Encouraged to exercise;Program counselor consult      Quality of Life Scores:     Quality of Life - 05/27/15 1529    Quality of Life Scores   Health/Function Pre 14.31 %   Socioeconomic Pre 20 %   Psych/Spiritual Pre 12 %   Family Pre 25.5 %   GLOBAL Pre 16.4 %      PHQ-9:     Recent Review Flowsheet Data    Depression screen PHQ 2/9 05/27/2015   Down, Depressed, Hopeless 3   PHQ - 2 Score 3   Altered sleeping 3   Tired, decreased energy 3   Change in  appetite 3   Feeling bad or failure about yourself  3   Trouble concentrating 3   Moving slowly or fidgety/restless 0   Suicidal thoughts 0   PHQ-9 Score 18   Difficult doing work/chores Very difficult      Psychosocial Evaluation and Intervention:     Psychosocial Evaluation - 05/28/15 0941    Psychosocial Evaluation & Interventions   Interventions Stress management education;Relaxation education;Encouraged to exercise with the program and follow exercise prescription   Comments Counselor met with Ms. Westbay today for initial psychosocial evaluation.  She is a [age]ear old who has had a lot of stress in her life over the past 2 years that have precipitated her cardiac condiiton.  Ms. Weinert spouse passed away 2 years ago after a lengthy illness.  Her daughter has issues with substance abuse and has moved back into Ms. Legan home, and brought her two children.  Ms. Nesbitt has learned that the foundation of her home needs repair and this is costly.  All of this on top of her health issues  has been challenging.  Ms. Teston is not sleeping particularly well and has lost her appetite.  She reports never having been diagnosed with depression or anxiety but is experiencing symptoms of both currently and is on medication for this.  However, she is not sure how effective this is being in addressing her current symptoms.  Her support system is good with (3) sisters closeby and she attends her local church.  counselor encouraged Ms. Youngers to speak with her doctor about increasing her medications possibly at this time to see if they address her current symptoms and improve her sleep.  Counselor mentioned the need for a sleep study as well since this has been an ongoing concern.  Also, counselor recommended Ms. Dasaro attend [facility] or talk with a counselor about the stress in her life that is ongoing.  She desires to improve her energy and her mood while in this program.     Continued Psychosocial Services Needed  Yes  Ms. Menard will benefit from all of the psychoeducational components of this program - particularly stress management.        Psychosocial Re-Evaluation:   Vocational Rehabilitation: Provide vocational rehab assistance to qualifying candidates.   Vocational Rehab Evaluation & Intervention:     Vocational Rehab - 05/27/15 1504    Initial Vocational Rehab Evaluation & Intervention   Assessment shows need for Vocational Rehabilitation No      Education: Education Goals: Education classes will be provided on a weekly basis, covering required topics. Participant will state understanding/return demonstration of topics presented.  Learning Barriers/Preferences:     Learning Barriers/Preferences - 05/27/15 1504    Learning Barriers/Preferences   Learning Barriers Sight      Education Topics: General Nutrition Guidelines/Fats and Fiber: -Group instruction provided by verbal, written material, models and posters to present the general guidelines for heart healthy nutrition. Gives an explanation and review of dietary fats and fiber.   Controlling Sodium/Reading Food Labels: -Group verbal and written material supporting the discussion of sodium use in heart healthy nutrition. Review and explanation with models, verbal and written materials for utilization of the food label.   Exercise Physiology & Risk Factors: - Group verbal and written instruction with models to review the exercise physiology of the cardiovascular system and associated critical values. Details cardiovascular disease risk factors and the goals associated with each risk factor.          Cardiac Rehab from 06/04/2015 in [facility] Cardiac Rehab   Date  05/30/15   Educator  KM   Instruction Review Code  2- meets goals/outcomes      Aerobic Exercise & Resistance Training: - Gives group verbal and written discussion on the health impact of inactivity. On the components of aerobic and resistive training programs and  the benefits of this training and how to safely progress through these programs.      Cardiac Rehab from 06/04/2015 in [facility] Cardiac Rehab   Date  06/04/15   Educator  RM   Instruction Review Code  2- meets goals/outcomes      Flexibility, Balance, General Exercise Guidelines: - Provides group verbal and written instruction on  the benefits of flexibility and balance training programs. Provides general exercise guidelines with specific guidelines to those with heart or lung disease. Demonstration and skill practice provided.   Stress Management: - Provides group verbal and written instruction about the health risks of elevated stress, cause of high stress, and healthy ways to reduce stress.   Depression: - Provides group verbal and written instruction on the correlation between heart/lung disease and depressed mood, treatment options, and the stigmas associated with seeking treatment.   Anatomy & Physiology of the Heart: - Group verbal and written instruction and models provide basic cardiac anatomy and physiology, with the coronary electrical and arterial systems. Review of: AMI, Angina, Valve disease, Heart Failure, Cardiac Arrhythmia, Pacemakers, and the ICD.   Cardiac Procedures: - Group verbal and written instruction and models to describe the testing methods done to diagnose heart disease. Reviews the outcomes of the test results. Describes the treatment choices: Medical Management, Angioplasty, or Coronary Bypass Surgery.   Cardiac Medications: - Group verbal and written instruction to review commonly prescribed medications for heart disease. Reviews the medication, class of the drug, and side effects. Includes the steps to properly store meds and maintain the prescription regimen.   Go Sex-Intimacy & Heart Disease, Get SMART - Goal Setting: - Group verbal and written instruction through game format to discuss heart disease and the return to sexual intimacy. Provides group verbal  and written material to discuss and apply goal setting through the application of the S.M.A.R.T. Method.   Other Matters of the Heart: - Provides group verbal, written materials and models to describe Heart Failure, Angina, Valve Disease, and Diabetes in the realm of heart disease. Includes description of the disease process and treatment options available to the cardiac patient.   Exercise & Equipment Safety: - Individual verbal instruction and demonstration of equipment use and safety with use of the equipment.      Cardiac Rehab from 06/04/2015 in [facility] Cardiac Rehab   Date  05/27/15   Educator  C. Rhandi Despain,RN   Instruction Review Code  1- partially meets, needs review/practice      Infection Prevention: - Provides verbal and written material to individual with discussion of infection control including proper hand washing and proper equipment cleaning during exercise session.      Cardiac Rehab from 06/04/2015 in [facility] Cardiac Rehab   Date  05/27/15   Educator  C. [place]   Instruction Review Code  1- partially meets, needs review/practice      Falls Prevention: - Provides verbal and written material to individual with discussion of falls prevention and safety.      Cardiac Rehab from 06/04/2015 in [facility] Cardiac Rehab   Date  05/27/15   Educator  C. Asriel Westrup,   Instruction Review Code  2- meets goals/outcomes      Diabetes: - Individual verbal and written instruction to review signs/symptoms of diabetes, desired ranges of glucose level fasting, after meals and with exercise. Advice that pre and post exercise glucose checks will be done for 3 sessions at entry of program.    Knowledge Questionnaire Score:     Knowledge Questionnaire Score - 05/27/15 1503    Knowledge Questionnaire Score   Pre Score 22      Personal Goals and Risk Factors at Admission:     Personal Goals and Risk Factors at Admission - 05/27/15 1504    Personal Goals and Risk Factors on Admission    Diabetes No   Hypertension Yes   Goal  Participant will see blood pressure controlled within the values of 140/57m/Hg or within value directed by their physician.   Intervention Provide nutrition & aerobic exercise along with prescribed medications to achieve BP 140/90 or less.   Lipids No   Stress Yes   Goal To meet with psychosocial counselor for stress and relaxation information and guidance. To state understanding of performing relaxation techniques and or identifying personal stressors.  PKhrystinareports her 2 granchldren and her daughter have lived with her for 2 years now.    Intervention Provide education on types of stress, identifiying stressors, and ways to cope with stress. Provide demonstration and active practice of relaxation techniques.      Personal Goals and Risk Factors Review:      Goals and Risk Factor Review      06/05/15 0935           Increase Aerobic Exercise and Physical Activity   Goals Progress/Improvement seen  Yes       Comments Encouraged her to exercise to decrease her stress.        Stress   Goal --  Met with our Cardiac Rehab counselor once.           Personal Goals Discharge (Final Personal Goals and Risk Factors Review):      Goals and Risk Factor Review - 06/05/15 0935    Increase Aerobic Exercise and Physical Activity   Goals Progress/Improvement seen  Yes   Comments Encouraged her to exercise to decrease her stress.    Stress   Goal --  Met with our Cardiac Rehab counselor once.        Comments: PChenoahhas multiple stress on her with her husband passing away 2 years ago plus PBuffiereports her daughter has substance abuse issues and she and Brad's 2 grandchildren live with her.

## 2015-06-06 DIAGNOSIS — Z9889 Other specified postprocedural states: Secondary | ICD-10-CM

## 2015-06-06 NOTE — Progress Notes (Signed)
Cardiac Individual Treatment Plan  Patient Details  Name: Colleen Williamson MRN: [id number] Date of Birth: [birth year] Referring Provider:  Minna Merritts, MD  Initial Encounter Date:  05/27/2015  Visit Diagnosis: S/P MVR (mitral valve repair)  Patient's Home Medications on Admission:  Current outpatient prescriptions:  .  amLODipine (NORVASC) 5 MG tablet, Take 5 mg by mouth daily., Disp: , Rfl:  .  apixaban (ELIQUIS) 5 MG TABS tablet, Take 1 tablet (5 mg total) by mouth 2 (two) times daily., Disp: 60 tablet, Rfl: 2 .  carvedilol (COREG) 3.125 MG tablet, Take 1 tablet (3.125 mg total) by mouth 2 (two) times daily., Disp: 180 tablet, Rfl: 3 .  magnesium oxide (MAG-OX) 400 (241.3 MG) MG tablet, Take 1 tablet (400 mg total) by mouth 2 (two) times daily., Disp: 60 tablet, Rfl: 5 .  potassium chloride SA (K-DUR,KLOR-CON) 20 MEQ tablet, Take 1 tablet (20 mEq total) by mouth 2 (two) times daily., Disp: 60 tablet, Rfl: 1 .  sertraline (ZOLOFT) 50 MG tablet, Take 50 mg by mouth at bedtime. , Disp: , Rfl:   Past Medical History: Past Medical History  Diagnosis Date  . Abnormal mammogram, unspecified 2014  . Breast cyst   . Stroke ([place]) 07/03/2014  . Mitral valve disorder     mitral valve repair    Tobacco Use: History  Smoking status  . Never Smoker   Smokeless tobacco  . Never Used    Comment: quit smoking in her 63's    Labs: Recent Review Flowsheet Data    Labs for ITP Cardiac and Pulmonary Rehab Latest Ref Rng 07/03/2014 07/03/2014 07/03/2014 07/04/2014   Cholestrol 0 - 200 mg/dL - - - 132   LDLCALC 0 - 99 mg/dL - - - 55   HDL >39 mg/dL - - - 46   Trlycerides <150 mg/dL - 148 - 153(H)   Hemoglobin A1c <5.7 % - - - 5.6   PHART 7.350 - 7.450 - - 7.378 -   PCO2ART 35.0 - 45.0 mmHg - - 33.0(L) -   HCO3 20.0 - 24.0 mEq/L - - 19.4(L) -   TCO2 0 - 100 mmol/L 21 - 20 -   ACIDBASEDEF 0.0 - 2.0 mmol/L - - 5.0(H) -   O2SAT - - - 98.0 -       Exercise Target Goals:    Exercise  Program Goal: Individual exercise prescription set with THRR, safety & activity barriers. Participant demonstrates ability to understand and report RPE using BORG scale, to self-measure pulse accurately, and to acknowledge the importance of the exercise prescription.  Exercise Prescription Goal: Starting with aerobic activity 30 plus minutes a day, 3 days per week for initial exercise prescription. Provide home exercise prescription and guidelines that participant acknowledges understanding prior to discharge.  Activity Barriers & Risk Stratification:     Activity Barriers & Risk Stratification - 05/27/15 1353    Activity Barriers & Risk Stratification   Activity Barriers None   Risk Stratification Moderate      6 Minute Walk:     6 Minute Walk      05/27/15 1638       6 Minute Walk   Phase Initial     Distance 1435 feet     Walk Time 6 minutes     Resting HR 77 bpm     Resting BP 110/80 mmHg     Max Ex. HR 85 bpm     Max Ex. BP 120/72 mmHg  RPE 11     Symptoms No        Initial Exercise Prescription:     Initial Exercise Prescription - 05/27/15 1600    Date of Initial Exercise Prescription   Date 05/27/15   Treadmill   MPH 2   Grade 0   Minutes 10   Recumbant Bike   Level 2   RPM 40   Watts 20   Minutes 10   NuStep   Level 2   Watts 40   Minutes 10   Arm Ergometer   Level 1   Watts 10   Minutes 10   REL-XR   Level 3   Watts 50   Minutes 10   Prescription Details   Frequency (times per week) 3   Duration Progress to 30 minutes of continuous aerobic without signs/symptoms of physical distress   Intensity   THRR REST +  30   Ratings of Perceived Exertion 11-15   Perceived Dyspnea 2-4   Progression Continue progressive overload as per policy without signs/symptoms or physical distress.   Resistance Training   Training Prescription Yes   Weight 2   Reps 10-15      Exercise Prescription Changes:     Exercise Prescription Changes       06/04/15 1200           Exercise Review   Progression No       Response to Exercise   Blood Pressure (Admit) 110/72 mmHg       Blood Pressure (Exercise) 130/74 mmHg       Blood Pressure (Exit) 106/66 mmHg       Heart Rate (Admit) 77 bpm       Heart Rate (Exercise) 92 bpm       Heart Rate (Exit) 69 bpm       Rating of Perceived Exertion (Exercise) 11       Symptoms no       Duration Progress to 30 minutes of continuous aerobic without signs/symptoms of physical distress       Intensity Rest + 30       Progression Continue progressive overload as per policy without signs/symptoms or physical distress.       Resistance Training   Training Prescription Yes       Weight 2       Reps 10-15       Interval Training   Interval Training No       Treadmill   MPH 3       Grade 0       Minutes 15       NuStep   Level 5       Watts 40       Minutes 15          Discharge Exercise Prescription (Final Exercise Prescription Changes):     Exercise Prescription Changes - 06/04/15 1200    Exercise Review   Progression No   Response to Exercise   Blood Pressure (Admit) 110/72 mmHg   Blood Pressure (Exercise) 130/74 mmHg   Blood Pressure (Exit) 106/66 mmHg   Heart Rate (Admit) 77 bpm   Heart Rate (Exercise) 92 bpm   Heart Rate (Exit) 69 bpm   Rating of Perceived Exertion (Exercise) 11   Symptoms no   Duration Progress to 30 minutes of continuous aerobic without signs/symptoms of physical distress   Intensity Rest + 30   Progression Continue progressive overload as per policy without signs/symptoms or  physical distress.   Resistance Training   Training Prescription Yes   Weight 2   Reps 10-15   Interval Training   Interval Training No   Treadmill   MPH 3   Grade 0   Minutes 15   NuStep   Level 5   Watts 40   Minutes 15      Nutrition:  Target Goals: Understanding of nutrition guidelines, daily intake of sodium <1546m, cholesterol <2073m calories 30% from fat and 7%  or less from saturated fats, daily to have 5 or more servings of fruits and vegetables.  Biometrics:     Pre Biometrics - 05/27/15 1640    Pre Biometrics   Height 5' 7.6" (1.717 m)   Weight 146 lb 12.8 oz (66.588 kg)   Waist Circumference 29 inches   Hip Circumference 40.5 inches   Waist to Hip Ratio 0.72 %   BMI (Calculated) 22.6       Nutrition Therapy Plan and Nutrition Goals:     Nutrition Therapy & Goals - 05/31/15 1441    Personal Nutrition Goals   Comments Patient prefers not to meet with dietitian at this time.      Nutrition Discharge: Rate Your Plate Scores:     Rate Your Plate - 1102/63/7855885  Rate Your Plate Scores   Pre Score 57   Pre Score % 63 %      Nutrition Goals Re-Evaluation:     Nutrition Goals Re-Evaluation      06/06/15 1316           Personal Goal #1 Re-Evaluation   Personal Goal #1 Early exit due to $15 copay each visit. Eats healthy. Healthy BMI also. Prefers not to meet individually with the Cardiac Rehab registered dietician.          Psychosocial: Target Goals: Acknowledge presence or absence of depression, maximize coping skills, provide positive support system. Participant is able to verbalize types and ability to use techniques and skills needed for reducing stress and depression.  Initial Review & Psychosocial Screening:     Initial Psych Review & Screening - 05/27/15 1506    Initial Review   Current issues with Current Stress Concerns   Family Dynamics   Good Support System? No   Concerns Recent loss of significant other;No support system   Barriers   Psychosocial barriers to participate in program The patient should benefit from training in stress management and relaxation.   Screening Interventions   Interventions Encouraged to exercise;Program counselor consult      Quality of Life Scores:     Quality of Life - 05/27/15 1529    Quality of Life Scores   Health/Function Pre 14.31 %   Socioeconomic Pre 20 %    Psych/Spiritual Pre 12 %   Family Pre 25.5 %   GLOBAL Pre 16.4 %      PHQ-9:     Recent Review Flowsheet Data    Depression screen PHQ 2/9 05/27/2015   Down, Depressed, Hopeless 3   PHQ - 2 Score 3   Altered sleeping 3   Tired, decreased energy 3   Change in appetite 3   Feeling bad or failure about yourself  3   Trouble concentrating 3   Moving slowly or fidgety/restless 0   Suicidal thoughts 0   PHQ-9 Score 18   Difficult doing work/chores Very difficult      Psychosocial Evaluation and Intervention:     Psychosocial Evaluation - 05/28/15 0941  Psychosocial Evaluation & Interventions   Interventions Stress management education;Relaxation education;Encouraged to exercise with the program and follow exercise prescription   Comments Counselor met with Colleen Williamson today for initial psychosocial evaluation.  She is a 69 year old who has had a lot of stress in her life over the past 2 years that have precipitated her cardiac condiiton.  Colleen Williamson spouse passed away 2 years ago after a lengthy illness.  Her daughter has issues with substance abuse and has moved back into Colleen Williamson home, and brought her two children.  Colleen Williamson has learned that the foundation of her home needs repair and this is costly.  All of this on top of her health issues  has been challenging.  Colleen Williamson is not sleeping particularly well and has lost her appetite.  She reports never having been diagnosed with depression or anxiety but is experiencing symptoms of both currently and is on medication for this.  However, she is not sure how effective this is being in addressing her current symptoms.  Her support system is good with (3) sisters closeby and she attends her local church.  counselor encouraged Colleen Williamson to speak with her doctor about increasing her medications possibly at this time to see if they address her current symptoms and improve her sleep.  Counselor mentioned the need for a sleep study as well  since this has been an ongoing concern.  Also, counselor recommended Colleen Williamson attend [facility] or talk with a counselor about the stress in her life that is ongoing.  She desires to improve her energy and her mood while in this program.     Continued Psychosocial Services Needed Yes  Colleen Williamson will benefit from all of the psychoeducational components of this program - particularly stress management.        Psychosocial Re-Evaluation:     Psychosocial Re-Evaluation      06/06/15 1318           Psychosocial Re-Evaluation   Interventions Encouraged to attend Cardiac Rehabilitation for the exercise;Stress management education       Comments Will cont. to exercise now for Free in the other gym with [employer] since stressed about $15 copay for Cardiac Rehab each visit. Colleen Williamson has asked her daughter to move out "it has been 2 years and I told her she needed to move out".           Vocational Rehabilitation: Provide vocational rehab assistance to qualifying candidates.   Vocational Rehab Evaluation & Intervention:     Vocational Rehab - 05/27/15 1504    Initial Vocational Rehab Evaluation & Intervention   Assessment shows need for Vocational Rehabilitation No      Education: Education Goals: Education classes will be provided on a weekly basis, covering required topics. Participant will state understanding/return demonstration of topics presented.  Learning Barriers/Preferences:     Learning Barriers/Preferences - 05/27/15 1504    Learning Barriers/Preferences   Learning Barriers Sight      Education Topics: General Nutrition Guidelines/Fats and Fiber: -Group instruction provided by verbal, written material, models and posters to present the general guidelines for heart healthy nutrition. Gives an explanation and review of dietary fats and fiber.   Controlling Sodium/Reading Food Labels: -Group verbal and written material supporting the discussion of sodium use in  heart healthy nutrition. Review and explanation with models, verbal and written materials for utilization of the food label.   Exercise Physiology & Risk Factors: - Group verbal and written  instruction with models to review the exercise physiology of the cardiovascular system and associated critical values. Details cardiovascular disease risk factors and the goals associated with each risk factor.          Cardiac Rehab from 06/06/2015 in [facility] Cardiac Rehab   Date  05/30/15   Educator  KM   Instruction Review Code  2- meets goals/outcomes      Aerobic Exercise & Resistance Training: - Gives group verbal and written discussion on the health impact of inactivity. On the components of aerobic and resistive training programs and the benefits of this training and how to safely progress through these programs.      Cardiac Rehab from 06/06/2015 in [facility] Cardiac Rehab   Date  06/04/15   Educator  RM   Instruction Review Code  2- meets goals/outcomes      Flexibility, Balance, General Exercise Guidelines: - Provides group verbal and written instruction on the benefits of flexibility and balance training programs. Provides general exercise guidelines with specific guidelines to those with heart or lung disease. Demonstration and skill practice provided.   Stress Management: - Provides group verbal and written instruction about the health risks of elevated stress, cause of high stress, and healthy ways to reduce stress.   Depression: - Provides group verbal and written instruction on the correlation between heart/lung disease and depressed mood, treatment options, and the stigmas associated with seeking treatment.   Anatomy & Physiology of the Heart: - Group verbal and written instruction and models provide basic cardiac anatomy and physiology, with the coronary electrical and arterial systems. Review of: AMI, Angina, Valve disease, Heart Failure, Cardiac Arrhythmia, Pacemakers, and the  ICD.   Cardiac Procedures: - Group verbal and written instruction and models to describe the testing methods done to diagnose heart disease. Reviews the outcomes of the test results. Describes the treatment choices: Medical Management, Angioplasty, or Coronary Bypass Surgery.   Cardiac Medications: - Group verbal and written instruction to review commonly prescribed medications for heart disease. Reviews the medication, class of the drug, and side effects. Includes the steps to properly store meds and maintain the prescription regimen.      Cardiac Rehab from 06/06/2015 in [facility] Cardiac Rehab   Date  06/06/15   Educator  C. Hedi Barkan, RN   Instruction Review Code  2- meets goals/outcomes      Go Sex-Intimacy & Heart Disease, Get SMART - Goal Setting: - Group verbal and written instruction through game format to discuss heart disease and the return to sexual intimacy. Provides group verbal and written material to discuss and apply goal setting through the application of the S.M.A.R.T. Method.   Other Matters of the Heart: - Provides group verbal, written materials and models to describe Heart Failure, Angina, Valve Disease, and Diabetes in the realm of heart disease. Includes description of the disease process and treatment options available to the cardiac patient.   Exercise & Equipment Safety: - Individual verbal instruction and demonstration of equipment use and safety with use of the equipment.      Cardiac Rehab from 06/06/2015 in [facility] Cardiac Rehab   Date  05/27/15   Educator  C. Zakya Halabi,RN   Instruction Review Code  1- partially meets, needs review/practice      Infection Prevention: - Provides verbal and written material to individual with discussion of infection control including proper hand washing and proper equipment cleaning during exercise session.      Cardiac Rehab from 06/06/2015 in [facility] Cardiac Rehab  Date  05/27/15   Educator  C. Madeira Beach   Instruction Review  Code  1- partially meets, needs review/practice      Falls Prevention: - Provides verbal and written material to individual with discussion of falls prevention and safety.      Cardiac Rehab from 06/06/2015 in [facility] Cardiac Rehab   Date  05/27/15   Educator  C. Remer Couse,   Instruction Review Code  2- meets goals/outcomes      Diabetes: - Individual verbal and written instruction to review signs/symptoms of diabetes, desired ranges of glucose level fasting, after meals and with exercise. Advice that pre and post exercise glucose checks will be done for 3 sessions at entry of program.    Knowledge Questionnaire Score:     Knowledge Questionnaire Score - 05/27/15 1503    Knowledge Questionnaire Score   Pre Score 22      Personal Goals and Risk Factors at Admission:     Personal Goals and Risk Factors at Admission - 05/27/15 1504    Personal Goals and Risk Factors on Admission   Diabetes No   Hypertension Yes   Goal Participant will see blood pressure controlled within the values of 140/86m/Hg or within value directed by their physician.   Intervention Provide nutrition & aerobic exercise along with prescribed medications to achieve BP 140/90 or less.   Lipids No   Stress Yes   Goal To meet with psychosocial counselor for stress and relaxation information and guidance. To state understanding of performing relaxation techniques and or identifying personal stressors.  PDonnisreports her 2 granchldren and her daughter have lived with her for 2 years now.    Intervention Provide education on types of stress, identifiying stressors, and ways to cope with stress. Provide demonstration and active practice of relaxation techniques.      Personal Goals and Risk Factors Review:      Goals and Risk Factor Review      06/05/15 0935           Increase Aerobic Exercise and Physical Activity   Goals Progress/Improvement seen  Yes       Comments Encouraged her to exercise to  decrease her stress.        Stress   Goal --  Met with our Cardiac Rehab counselor once.           Personal Goals Discharge (Final Personal Goals and Risk Factors Review):      Goals and Risk Factor Review - 06/05/15 0935    Increase Aerobic Exercise and Physical Activity   Goals Progress/Improvement seen  Yes   Comments Encouraged her to exercise to decrease her stress.    Stress   Goal --  Met with our Cardiac Rehab counselor once.        Comments: Early exit-will go to [employer] in the our other gym since she has a $15 copay /each Cardiac REhab visit. STable Blood pressure and no problems with her heart rhythm.

## 2015-06-06 NOTE — Progress Notes (Signed)
Discharge Summary  Patient Details  Name: Colleen Williamson MRN: [id number] Date of Birth: 06/02/1946 Referring Provider:  Minna Merritts, MD   Number of Visits: 8  Reason for Discharge:  Early Exit:  Personal  Smoking History:  History  Smoking status  . Never Smoker   Smokeless tobacco  . Never Used    Comment: quit smoking in her 30's    Diagnosis:  S/P MVR (mitral valve repair)  ADL UCSD:   Initial Exercise Prescription:     Initial Exercise Prescription - 05/27/15 1600    Date of Initial Exercise Prescription   Date 05/27/15   Treadmill   MPH 2   Grade 0   Minutes 10   Recumbant Bike   Level 2   RPM 40   Watts 20   Minutes 10   NuStep   Level 2   Watts 40   Minutes 10   Arm Ergometer   Level 1   Watts 10   Minutes 10   REL-XR   Level 3   Watts 50   Minutes 10   Prescription Details   Frequency (times per week) 3   Duration Progress to 30 minutes of continuous aerobic without signs/symptoms of physical distress   Intensity   THRR REST +  30   Ratings of Perceived Exertion 11-15   Perceived Dyspnea 2-4   Progression Continue progressive overload as per policy without signs/symptoms or physical distress.   Resistance Training   Training Prescription Yes   Weight 2   Reps 10-15      Discharge Exercise Prescription (Final Exercise Prescription Changes):     Exercise Prescription Changes - 06/04/15 1200    Exercise Review   Progression No   Response to Exercise   Blood Pressure (Admit) 110/72 mmHg   Blood Pressure (Exercise) 130/74 mmHg   Blood Pressure (Exit) 106/66 mmHg   Heart Rate (Admit) 77 bpm   Heart Rate (Exercise) 92 bpm   Heart Rate (Exit) 69 bpm   Rating of Perceived Exertion (Exercise) 11   Symptoms no   Duration Progress to 30 minutes of continuous aerobic without signs/symptoms of physical distress   Intensity Rest + 30   Progression Continue progressive overload as per policy without signs/symptoms or physical  distress.   Resistance Training   Training Prescription Yes   Weight 2   Reps 10-15   Interval Training   Interval Training No   Treadmill   MPH 3   Grade 0   Minutes 15   NuStep   Level 5   Watts 40   Minutes 15      Functional Capacity:     6 Minute Walk      05/27/15 1638       6 Minute Walk   Phase Initial     Distance 1435 feet     Walk Time 6 minutes     Resting HR 77 bpm     Resting BP 110/80 mmHg     Max Ex. HR 85 bpm     Max Ex. BP 120/72 mmHg     RPE 11     Symptoms No        Psychological, QOL, Others - Outcomes: PHQ 2/9: Depression screen PHQ 2/9 05/27/2015  Down, Depressed, Hopeless 3  PHQ - 2 Score 3  Altered sleeping 3  Tired, decreased energy 3  Change in appetite 3  Feeling bad or failure about yourself  3  Trouble concentrating  3  Moving slowly or fidgety/restless 0  Suicidal thoughts 0  PHQ-9 Score 18  Difficult doing work/chores Very difficult    Quality of Life:     Quality of Life - 05/27/15 1529    Quality of Life Scores   Health/Function Pre 14.31 %   Socioeconomic Pre 20 %   Psych/Spiritual Pre 12 %   Family Pre 25.5 %   GLOBAL Pre 16.4 %      Personal Goals: Goals established at orientation with interventions provided to work toward goal.     Personal Goals and Risk Factors at Admission - 05/27/15 1504    Personal Goals and Risk Factors on Admission   Diabetes No   Hypertension Yes   Goal Participant will see blood pressure controlled within the values of 140/76m/Hg or within value directed by their physician.   Intervention Provide nutrition & aerobic exercise along with prescribed medications to achieve BP 140/90 or less.   Lipids No   Stress Yes   Goal To meet with psychosocial counselor for stress and relaxation information and guidance. To state understanding of performing relaxation techniques and or identifying personal stressors.  PSeemareports her 2 granchldren and her daughter have lived with her  for 2 years now.    Intervention Provide education on types of stress, identifiying stressors, and ways to cope with stress. Provide demonstration and active practice of relaxation techniques.       Personal Goals Discharge:     Goals and Risk Factor Review      06/05/15 0935           Increase Aerobic Exercise and Physical Activity   Goals Progress/Improvement seen  Yes       Comments Encouraged her to exercise to decrease her stress.        Stress   Goal --  Met with our Cardiac Rehab counselor once.           Nutrition & Weight - Outcomes:     Pre Biometrics - 05/27/15 1640    Pre Biometrics   Height 5' 7.6" (1.717 m)   Weight 146 lb 12.8 oz (66.588 kg)   Waist Circumference 29 inches   Hip Circumference 40.5 inches   Waist to Hip Ratio 0.72 %   BMI (Calculated) 22.6       Nutrition:     Nutrition Therapy & Goals - 05/31/15 1441    Personal Nutrition Goals   Comments Patient prefers not to meet with dietitian at this time.      Nutrition Discharge:     Rate Your Plate - 155/37/4812707   Rate Your Plate Scores   Pre Score 57   Pre Score % 63 %      Education Questionnaire Score:     Knowledge Questionnaire Score - 05/27/15 1503    Knowledge Questionnaire Score   Pre Score 22      Goals reviewed with patient; copy given to patient.

## 2015-06-06 NOTE — Progress Notes (Signed)
Daily Session Note  Patient Details  Name: Colleen Williamson MRN: [id number] Date of Birth: February 05, 1946 Referring Provider:  Minna Merritts, MD  Encounter Date: 06/06/2015  Check In:     Session Check In - 06/06/15 0853    Check-In   Staff Present Other;Steven Way BS, ACSM EP-C, Exercise Physiologist;Carroll Enterkin RN, BSN   ER physicians immediately available to respond to emergencies See telemetry face sheet for immediately available ER MD   Medication changes reported     No   Fall or balance concerns reported    No   Warm-up and Cool-down Performed on first and last piece of equipment   VAD Patient? No   Pain Assessment   Currently in Pain? No/denies         Goals Met:  Independence with exercise equipment Exercise tolerated well No report of cardiac concerns or symptoms Strength training completed today  Goals Unmet:  Not Applicable  Goals Comments:    Dr. Emily Filbert is Medical Director for [facility].

## 2015-06-06 NOTE — Addendum Note (Signed)
Addended by: Gerlene Burdock on: 06/06/2015 01:23 PM   Modules accepted: Orders

## 2015-07-03 ENCOUNTER — Other Ambulatory Visit: Payer: Self-pay | Admitting: *Deleted

## 2015-07-03 DIAGNOSIS — Z9889 Other specified postprocedural states: Secondary | ICD-10-CM

## 2015-07-08 ENCOUNTER — Telehealth: Payer: Self-pay | Admitting: *Deleted

## 2015-07-08 NOTE — Telephone Encounter (Signed)
Patient calling the office for samples of medication:   1.  What medication and dosage are you requesting samples for? Eliquis 5 mg  2.  Are you currently out of this medication? About to be

## 2015-07-08 NOTE — Telephone Encounter (Signed)
Spoke with patient told samples of Eliquis 5 mg available. LOT # [id number] Exp. 11/2017

## 2015-07-19 ENCOUNTER — Encounter: Payer: Self-pay | Admitting: Internal Medicine

## 2015-07-19 ENCOUNTER — Ambulatory Visit (INDEPENDENT_AMBULATORY_CARE_PROVIDER_SITE_OTHER): Payer: PPO | Admitting: Internal Medicine

## 2015-07-19 VITALS — BP 130/76 | HR 60 | Ht 67.0 in | Wt 143.4 lb

## 2015-07-19 DIAGNOSIS — I471 Supraventricular tachycardia: Secondary | ICD-10-CM | POA: Diagnosis not present

## 2015-07-19 DIAGNOSIS — I480 Paroxysmal atrial fibrillation: Secondary | ICD-10-CM

## 2015-07-19 DIAGNOSIS — I483 Typical atrial flutter: Secondary | ICD-10-CM

## 2015-07-19 NOTE — Assessment & Plan Note (Signed)
No recurrent symptoms. Will follow. 

## 2015-07-19 NOTE — Progress Notes (Signed)
HPI Mrs. Crummie returns today after being admitted for palpitations and having SVT and undergoing DCCV. She is a pleasant 69 yo woman with mitral valve disease who underwent mitral valve repair several months ago. She had post op atrial fib by her report and sustained a stroke. She had recurrent atrial flutter and several DC cardioversions. She underwent catheter ablation of typical atrial flutter several months ago. She has done well in the interim until presenting with palpitations. Her initial ECG demonstrated lots of atrial ectopy vs atypical flutter. She then developed a ?short RP tachycardia (difficult to know where the p waves were) and was cardioverted. She had been on flecainide prior but developed a metallic taste in her mouth. This has resolved with stopping the flecainide. She had some fatigue and had her bisoprolol stopped. When I last saw her we started low dose coreg. She has improved. She denies any recurrent symptoms of svt.  Allergies  Allergen Reactions  . Sulfa Antibiotics Nausea Only     Current Outpatient Prescriptions  Medication Sig Dispense Refill  . amLODipine (NORVASC) 5 MG tablet Take 5 mg by mouth daily.    Marland Kitchen apixaban (ELIQUIS) 5 MG TABS tablet Take 1 tablet (5 mg total) by mouth 2 (two) times daily. 60 tablet 2  . carvedilol (COREG) 3.125 MG tablet Take 1 tablet (3.125 mg total) by mouth 2 (two) times daily. 180 tablet 3  . magnesium oxide (MAG-OX) 400 (241.3 MG) MG tablet Take 1 tablet (400 mg total) by mouth 2 (two) times daily. 60 tablet 5  . potassium chloride SA (K-DUR,KLOR-CON) 20 MEQ tablet Take 1 tablet (20 mEq total) by mouth 2 (two) times daily. 60 tablet 1  . sertraline (ZOLOFT) 50 MG tablet Take 50 mg by mouth at bedtime.      No current facility-administered medications for this visit.     Past Medical History  Diagnosis Date  . Abnormal mammogram, unspecified 2014  . Breast cyst   . Stroke ([place]) 07/03/2014  . Mitral valve disorder    mitral valve repair    ROS:   All systems reviewed and negative except as noted in the HPI.   Past Surgical History  Procedure Laterality Date  . Abdominal hysterectomy  2004  . Eye surgery  2007  . Mitral valve repair  05/2014  . Radiology with anesthesia N/A 07/03/2014    Procedure: RADIOLOGY WITH ANESTHESIA;  Surgeon: Rob Hickman, MD;  Location: [facility];  Service: Radiology;  Laterality: N/A;  . Cardioversion N/A 09/21/2014    Procedure: CARDIOVERSION;  Surgeon: Josue Hector, MD;  Location: [facility] ENDOSCOPY;  Service: Cardiovascular;  Laterality: N/A;  . Electrophysiologic study N/A 11/28/2014    Procedure: Cardioversion;  Surgeon: Teodoro Spray, MD;  Location: [place] CV LAB;  Service: Cardiovascular;  Laterality: N/A;  . Electrophysiologic study N/A 12/27/2014    Procedure: A-Flutter;  Surgeon: Evans Lance, MD;  Location: [place] CV LAB;  Service: Cardiovascular;  Laterality: N/A;  . Electrophysiologic study N/A 11/28/2014    Procedure: Cardioversion;  Surgeon: Teodoro Spray, MD;  Location: [facility] ORS;  Service: Cardiovascular;  Laterality: N/A;  Have discussed with Dr. Marcello Moores     Family History  Problem Relation Age of Onset  . Cancer      BOTH SIDES OF THE FAMILY  . Diabetes      MATERNAL SIDE OF THE FAMILY  . Cancer Father      Social History  Social History  . Marital Status: Widowed    Spouse Name: N/A  . Number of Children: 3  . Years of Education: 12+   Occupational History  . Not on file.   Social History Main Topics  . Smoking status: Never Smoker   . Smokeless tobacco: Never Used     Comment: quit smoking in her 30's  . Alcohol Use: No  . Drug Use: No  . Sexual Activity: Not on file   Other Topics Concern  . Not on file   Social History Narrative   Patient is widowed with 3 children.   Patient is right handed.   Patient has hs education plus 1-2 yrs of college.   Patient drinks 1 cup daily.     BP 130/76 mmHg  Pulse 60  Ht  5\' 7"  (1.702 m)  Wt 143 lb 6.4 oz (65.046 kg)  BMI 22.45 kg/m2  Physical Exam:  Well appearing 69 yo woman, NAD HEENT: Unremarkable Neck:  6 cm JVD, no thyromegally Back:  No CVA tenderness Lungs:  Clear with no wheezes HEART:  Regular rate rhythm, no murmurs, no rubs, no clicks Abd:  soft, positive bowel sounds, no organomegally, no rebound, no guarding Ext:  2 plus pulses, no edema, no cyanosis, no clubbing Skin:  No rashes no nodules Neuro:  CN II through XII intact, motor grossly intact  EKG - sinus bradycardia rare pvc   Assess/Plan:

## 2015-07-19 NOTE — Patient Instructions (Signed)
Medication Instructions:  Your physician recommends that you continue on your current medications as directed. Please refer to the Current Medication list given to you today.    Labwork: None ordered   Testing/Procedures: None ordered   Follow-Up: Your physician wants you to follow-up in: 3-6 months with Dr Lovena Le. You will receive a reminder letter in the mail two months in advance. If you don't receive a letter, please call our office to schedule the follow-up appointment.  Patient will call to schedule   Any Other Special Instructions Will Be Listed Below (If Applicable).     If you need a refill on your cardiac medications before your next appointment, please call your pharmacy.

## 2015-07-19 NOTE — Assessment & Plan Note (Signed)
She has been asymptomatic. Will follow.

## 2015-07-30 DIAGNOSIS — H35371 Puckering of macula, right eye: Secondary | ICD-10-CM | POA: Diagnosis not present

## 2015-08-20 DIAGNOSIS — F419 Anxiety disorder, unspecified: Secondary | ICD-10-CM | POA: Insufficient documentation

## 2015-08-20 DIAGNOSIS — F32A Depression, unspecified: Secondary | ICD-10-CM | POA: Insufficient documentation

## 2015-08-20 DIAGNOSIS — F329 Major depressive disorder, single episode, unspecified: Secondary | ICD-10-CM | POA: Insufficient documentation

## 2015-08-20 DIAGNOSIS — L659 Nonscarring hair loss, unspecified: Secondary | ICD-10-CM | POA: Diagnosis not present

## 2015-11-13 DIAGNOSIS — Z13220 Encounter for screening for lipoid disorders: Secondary | ICD-10-CM | POA: Diagnosis not present

## 2015-11-13 DIAGNOSIS — I10 Essential (primary) hypertension: Secondary | ICD-10-CM | POA: Diagnosis not present

## 2015-11-13 DIAGNOSIS — Z79899 Other long term (current) drug therapy: Secondary | ICD-10-CM | POA: Diagnosis not present

## 2015-11-13 DIAGNOSIS — Z1329 Encounter for screening for other suspected endocrine disorder: Secondary | ICD-10-CM | POA: Diagnosis not present

## 2015-11-20 ENCOUNTER — Ambulatory Visit (INDEPENDENT_AMBULATORY_CARE_PROVIDER_SITE_OTHER): Payer: PPO | Admitting: Cardiovascular Disease

## 2015-11-20 ENCOUNTER — Encounter: Payer: Self-pay | Admitting: Cardiovascular Disease

## 2015-11-20 ENCOUNTER — Ambulatory Visit
Admission: RE | Admit: 2015-11-20 | Discharge: 2015-11-20 | Disposition: A | Discharge status: Home / Self Care | Payer: PPO | Source: Ambulatory Visit | Attending: Internal Medicine | Admitting: Internal Medicine

## 2015-11-20 ENCOUNTER — Other Ambulatory Visit: Payer: Self-pay | Admitting: Internal Medicine

## 2015-11-20 VITALS — BP 100/70 | HR 56 | Ht 69.0 in | Wt 148.8 lb

## 2015-11-20 DIAGNOSIS — I4892 Unspecified atrial flutter: Secondary | ICD-10-CM | POA: Diagnosis not present

## 2015-11-20 DIAGNOSIS — Z0000 Encounter for general adult medical examination without abnormal findings: Secondary | ICD-10-CM | POA: Diagnosis not present

## 2015-11-20 DIAGNOSIS — I480 Paroxysmal atrial fibrillation: Secondary | ICD-10-CM

## 2015-11-20 DIAGNOSIS — R42 Dizziness and giddiness: Secondary | ICD-10-CM

## 2015-11-20 DIAGNOSIS — I471 Supraventricular tachycardia, unspecified: Secondary | ICD-10-CM

## 2015-11-20 DIAGNOSIS — I483 Typical atrial flutter: Secondary | ICD-10-CM | POA: Diagnosis not present

## 2015-11-20 DIAGNOSIS — E559 Vitamin D deficiency, unspecified: Secondary | ICD-10-CM | POA: Diagnosis not present

## 2015-11-20 DIAGNOSIS — R200 Anesthesia of skin: Secondary | ICD-10-CM | POA: Diagnosis not present

## 2015-11-20 DIAGNOSIS — Z9889 Other specified postprocedural states: Secondary | ICD-10-CM | POA: Diagnosis not present

## 2015-11-20 DIAGNOSIS — R002 Palpitations: Secondary | ICD-10-CM | POA: Diagnosis not present

## 2015-11-20 DIAGNOSIS — R202 Paresthesia of skin: Secondary | ICD-10-CM | POA: Diagnosis not present

## 2015-11-20 DIAGNOSIS — Z1239 Encounter for other screening for malignant neoplasm of breast: Secondary | ICD-10-CM | POA: Diagnosis not present

## 2015-11-20 DIAGNOSIS — Z1231 Encounter for screening mammogram for malignant neoplasm of breast: Secondary | ICD-10-CM

## 2015-11-20 DIAGNOSIS — I10 Essential (primary) hypertension: Secondary | ICD-10-CM | POA: Diagnosis not present

## 2015-11-20 DIAGNOSIS — F419 Anxiety disorder, unspecified: Secondary | ICD-10-CM | POA: Diagnosis not present

## 2015-11-20 DIAGNOSIS — Z79899 Other long term (current) drug therapy: Secondary | ICD-10-CM | POA: Diagnosis not present

## 2015-11-20 NOTE — Assessment & Plan Note (Signed)
She denies any symptoms concerning for arrhythmia Currently on anticoagulation given history of atrial fibrillation, atrial flutter, prior history of stroke She reports when she had stroke, she was not on anticoagulation

## 2015-11-20 NOTE — Assessment & Plan Note (Signed)
I suspect symptoms are secondary to orthostasis, side effect of her blood pressure medication. She reports that carotid ultrasound has been ordered by Dr. Doy Hutching. She did have one December 2015 that showed only intimal thickening at that time.    Total encounter time more than 25 minutes  Greater than 50% was spent in counseling and coordination of care with the patient

## 2015-11-20 NOTE — Addendum Note (Signed)
Addended by: Minna Merritts on: 11/20/2015 06:03 PM   Modules accepted: Level of Service

## 2015-11-20 NOTE — Assessment & Plan Note (Signed)
Blood pressures running low, she has orthostasis by symptoms. Blood pressure 100 to 105 on my check today Recommended she hold her amlodipine If she continues to have dizziness when she walks, recommended that she call our office

## 2015-11-20 NOTE — Progress Notes (Signed)
Patient ID: Colleen Williamson, female    DOB: 08-29-1945, 70 y.o.   MRN: [id number]  HPI Comments: Colleen Williamson is a very pleasant 70 year old woman with history of mitral valve prolapse, repair earlier in [facility] at [facility], postoperative atrial fibrillation, stroke, found to be in atrial flutter over the summer underwent DCCV , anti-coagulation with Eliquis, recurrent atrial flutter while on flecainide with second cardioversion 2 weeks ago, who underwent  cathter ablation in [place].  She presents for routine follow-up of her atrial flutter  In follow-up today, she reports that she is very dizzy   when she walks. She has noticed low blood pressure at times. Recent seen by Dr. Doy Hutching, he recommended decreasing amlodipine down to 2.5 mg daily, also recommended a carotid ultrasound.  She has symptoms of orthostasis, is fine when she is supine, occasionally dizzy when sitting, worse when standing. Systolic blood pressure today 100. She's been taking amlodipine 5 mg daily, carvedilol 3.125 mill grams twice a day, eliquis 5 mill grams twice a day  EKG on today's visit shows sinus bradycardia rate 56 bpm, no significant ST or T-wave changes  Other past medical history Previous he had problems with anxiety, insomnia, anorexia Notes indicating she previously lost 40 pounds         Allergies  Allergen Reactions  . Sulfa Antibiotics Nausea Only    Current Outpatient Prescriptions on File Prior to Visit  Medication Sig Dispense Refill  . apixaban (ELIQUIS) 5 MG TABS tablet Take 1 tablet (5 mg total) by mouth 2 (two) times daily. 60 tablet 2  . carvedilol (COREG) 3.125 MG tablet Take 1 tablet (3.125 mg total) by mouth 2 (two) times daily. 180 tablet 3  . magnesium oxide (MAG-OX) 400 (241.3 MG) MG tablet Take 1 tablet (400 mg total) by mouth 2 (two) times daily. 60 tablet 5  . sertraline (ZOLOFT) 50 MG tablet Take 50 mg by mouth at bedtime.      No current facility-administered  medications on file prior to visit.    Past Medical History  Diagnosis Date  . Abnormal mammogram, unspecified 2014  . Breast cyst   . Stroke ([place]) 07/03/2014  . Mitral valve disorder     mitral valve repair    Past Surgical History  Procedure Laterality Date  . Abdominal hysterectomy  2004  . Eye surgery  2007  . Mitral valve repair  05/2014  . Radiology with anesthesia N/A 07/03/2014    Procedure: RADIOLOGY WITH ANESTHESIA;  Surgeon: Rob Hickman, MD;  Location: [place];  Service: Radiology;  Laterality: N/A;  . Cardioversion N/A 09/21/2014    Procedure: CARDIOVERSION;  Surgeon: Josue Hector, MD;  Location: [facility] ENDOSCOPY;  Service: Cardiovascular;  Laterality: N/A;  . Electrophysiologic study N/A 11/28/2014    Procedure: Cardioversion;  Surgeon: Teodoro Spray, MD;  Location: [place] CV LAB;  Service: Cardiovascular;  Laterality: N/A;  . Electrophysiologic study N/A 12/27/2014    Procedure: A-Flutter;  Surgeon: Evans Lance, MD;  Location: [place] CV LAB;  Service: Cardiovascular;  Laterality: N/A;  . Electrophysiologic study N/A 11/28/2014    Procedure: Cardioversion;  Surgeon: Teodoro Spray, MD;  Location: [facility] ORS;  Service: Cardiovascular;  Laterality: N/A;  Have discussed with Dr. Marcello Moores  . Breast cyst aspiration  15 YRS AGO    Social History  reports that she has never smoked. She has never used smokeless tobacco. She reports that she does not drink alcohol or use illicit drugs.  Family History family history includes Breast cancer in her maternal aunt; Cancer in her father.   Review of Systems  Constitutional: Positive for fatigue.  Respiratory: Negative.   Cardiovascular: Negative.   Gastrointestinal: Negative.   Musculoskeletal: Negative.   Neurological: Positive for dizziness.  Hematological: Negative.   Psychiatric/Behavioral: Negative.   All other systems reviewed and are negative.   BP 100/70 mmHg  Pulse 56  Ht 5\' 9"  (1.753 m)  Wt 148 lb  12 oz (67.473 kg)  BMI 21.96 kg/m2  Physical Exam  Constitutional: She is oriented to person, place, and time.  Thin  HENT:  Head: Normocephalic.  Nose: Nose normal.  Mouth/Throat: Oropharynx is clear and moist.  Eyes: Conjunctivae are normal. Pupils are equal, round, and reactive to light.  Neck: Normal range of motion. Neck supple. No JVD present.  Cardiovascular: Normal rate, regular rhythm, normal heart sounds and intact distal pulses.  Exam reveals no gallop and no friction rub.   No murmur heard. Pulmonary/Chest: Effort normal and breath sounds normal. No respiratory distress. She has no wheezes. She has no rales. She exhibits no tenderness.  Abdominal: Soft. Bowel sounds are normal. She exhibits no distension. There is no tenderness.  Musculoskeletal: Normal range of motion. She exhibits no edema or tenderness.  Lymphadenopathy:    She has no cervical adenopathy.  Neurological: She is alert and oriented to person, place, and time. Coordination normal.  Skin: Skin is warm and dry. No rash noted. No erythema.  Psychiatric: She has a normal mood and affect. Her behavior is normal. Judgment and thought content normal.

## 2015-11-20 NOTE — Assessment & Plan Note (Signed)
Echocardiogram 1 year ago, mitral valve intact  No new clinical symptoms, no further imaging studies ordered at this time

## 2015-11-20 NOTE — Patient Instructions (Addendum)
You are doing well.  Please stop the amlodipine Call the office if you have dizzy spells  Please call us if you have new issues that need to be addressed before your next appt.  Your physician wants you to follow-up in: 12 months.  You will receive a reminder letter in the mail two months in advance. If you don't receive a letter, please call our office to schedule the follow-up appointment.

## 2015-11-21 ENCOUNTER — Ambulatory Visit: Payer: PPO | Admitting: Cardiovascular Disease

## 2015-12-03 ENCOUNTER — Encounter: Payer: Self-pay | Admitting: Emergency Medicine

## 2015-12-03 ENCOUNTER — Encounter: Payer: Self-pay | Admitting: Cardiovascular Disease

## 2015-12-03 ENCOUNTER — Telehealth: Payer: Self-pay | Admitting: Cardiovascular Disease

## 2015-12-03 ENCOUNTER — Emergency Department
Admission: EM | Admit: 2015-12-03 | Discharge: 2015-12-03 | Disposition: A | Discharge status: Home / Self Care | Payer: PPO | Attending: Emergency Medicine | Admitting: Emergency Medicine

## 2015-12-03 ENCOUNTER — Ambulatory Visit (INDEPENDENT_AMBULATORY_CARE_PROVIDER_SITE_OTHER): Payer: PPO | Admitting: Internal Medicine

## 2015-12-03 VITALS — HR 172 | Ht 69.0 in | Wt 149.5 lb

## 2015-12-03 DIAGNOSIS — R000 Tachycardia, unspecified: Secondary | ICD-10-CM

## 2015-12-03 DIAGNOSIS — Z79899 Other long term (current) drug therapy: Secondary | ICD-10-CM | POA: Diagnosis not present

## 2015-12-03 DIAGNOSIS — I4892 Unspecified atrial flutter: Secondary | ICD-10-CM | POA: Insufficient documentation

## 2015-12-03 DIAGNOSIS — Z8679 Personal history of other diseases of the circulatory system: Secondary | ICD-10-CM | POA: Diagnosis not present

## 2015-12-03 DIAGNOSIS — I10 Essential (primary) hypertension: Secondary | ICD-10-CM | POA: Diagnosis not present

## 2015-12-03 DIAGNOSIS — I471 Supraventricular tachycardia: Secondary | ICD-10-CM | POA: Insufficient documentation

## 2015-12-03 DIAGNOSIS — Z8673 Personal history of transient ischemic attack (TIA), and cerebral infarction without residual deficits: Secondary | ICD-10-CM | POA: Diagnosis not present

## 2015-12-03 DIAGNOSIS — I4891 Unspecified atrial fibrillation: Secondary | ICD-10-CM | POA: Diagnosis not present

## 2015-12-03 DIAGNOSIS — Z87891 Personal history of nicotine dependence: Secondary | ICD-10-CM | POA: Diagnosis not present

## 2015-12-03 DIAGNOSIS — R002 Palpitations: Secondary | ICD-10-CM | POA: Diagnosis not present

## 2015-12-03 HISTORY — DX: Supraventricular tachycardia: I47.1

## 2015-12-03 LAB — CBC WITH DIFFERENTIAL/PLATELET
BASOS ABS: 0.1 10*3/uL (ref 0–0.1)
BASOS PCT: 2 %
EOS PCT: 2 %
Eosinophils Absolute: 0.1 10*3/uL (ref 0–0.7)
HEMATOCRIT: 45.4 % (ref 35.0–47.0)
Hemoglobin: 14.7 g/dL (ref 12.0–16.0)
LYMPHS PCT: 28 %
Lymphs Abs: 1.9 10*3/uL (ref 1.0–3.6)
MCH: 28.4 pg (ref 26.0–34.0)
MCHC: 32.4 g/dL (ref 32.0–36.0)
MCV: 87.6 fL (ref 80.0–100.0)
Monocytes Absolute: 0.5 10*3/uL (ref 0.2–0.9)
Monocytes Relative: 7 %
NEUTROS ABS: 4 10*3/uL (ref 1.4–6.5)
Neutrophils Relative %: 61 %
PLATELETS: 211 10*3/uL (ref 150–440)
RBC: 5.18 MIL/uL (ref 3.80–5.20)
RDW: 13.9 % (ref 11.5–14.5)
WBC: 6.6 10*3/uL (ref 3.6–11.0)

## 2015-12-03 LAB — BASIC METABOLIC PANEL
ANION GAP: 6 (ref 5–15)
BUN: 21 mg/dL — ABNORMAL HIGH (ref 6–20)
CALCIUM: 9.7 mg/dL (ref 8.9–10.3)
CO2: 30 mmol/L (ref 22–32)
Chloride: 106 mmol/L (ref 101–111)
Creatinine, Ser: 0.76 mg/dL (ref 0.44–1.00)
Glucose, Bld: 97 mg/dL (ref 65–99)
POTASSIUM: 4.2 mmol/L (ref 3.5–5.1)
SODIUM: 142 mmol/L (ref 135–145)

## 2015-12-03 LAB — MAGNESIUM: Magnesium: 2.2 mg/dL (ref 1.7–2.4)

## 2015-12-03 MED ORDER — FENTANYL CITRATE (PF) 100 MCG/2ML IJ SOLN
INTRAMUSCULAR | Status: AC
  Administered 2015-12-03: 50 ug via INTRAVENOUS
  Filled 2015-12-03: qty 2

## 2015-12-03 MED ORDER — ADENOSINE 6 MG/2ML IV SOLN
12.0000 mg | Freq: Once | INTRAVENOUS | Status: AC
  Administered 2015-12-03: 12 mg via INTRAVENOUS

## 2015-12-03 MED ORDER — ADENOSINE 12 MG/4ML IV SOLN
INTRAVENOUS | Status: AC
  Filled 2015-12-03: qty 4

## 2015-12-03 MED ORDER — ETOMIDATE 2 MG/ML IV SOLN
10.0000 mg | Freq: Once | INTRAVENOUS | Status: AC
  Administered 2015-12-03: 10 mg via INTRAVENOUS

## 2015-12-03 MED ORDER — FENTANYL CITRATE (PF) 100 MCG/2ML IJ SOLN
50.0000 ug | Freq: Once | INTRAMUSCULAR | Status: AC
  Administered 2015-12-03: 50 ug via INTRAVENOUS

## 2015-12-03 MED ORDER — ETOMIDATE 2 MG/ML IV SOLN
INTRAVENOUS | Status: AC
  Administered 2015-12-03: 10 mg via INTRAVENOUS
  Filled 2015-12-03: qty 10

## 2015-12-03 NOTE — Telephone Encounter (Signed)
Pt calling stating she was told to stop amlodipine This morning she had an episode with her Heart rate  States she has not had any issues since being off until today.  It was beating really fast around 110, but states it was all over the place BP 82/62 and  101/72 (this was taken this morning on left arm)  Not sure if we can have her go back on the medication or she would like something else she can take Please advise

## 2015-12-03 NOTE — ED Provider Notes (Addendum)
[facility] Emergency Department Provider Note   ____________________________________________  Time seen: Seen upon arrival to the treatment area  I have reviewed the triage vital signs and the nursing notes.   HISTORY  Chief Complaint Tachycardia   HPI Colleen Williamson is a 70 y.o. female with a history of SVT as well as mitral valve repair was presenting to the emergency department in SVT today. She was sent over from her cardiologist's office, Dr. Caryl Comes. She says that she began having palpitations this morning at about 6 AM. She felt her heart racing at that time. She went in to Dr. Olin Pia office where she was given a 12 mg dose of adenosine without resolution of her symptoms. Per Dr. Olin Pia office notes she was then instructed to go to the [facility] by private vehicle to be cardioverted. The patient denies any chest pain or shortness of breath at this time.Patient denies any lack of sleep. Says that she did have some additional stress over the weekend because she was performing a wedding. No recent medication changes but the daughter says that she was discontinued off of one of her medications about one month ago. No excessive caffeine use. Patient denies any recreational drugs.   Past Medical History  Diagnosis Date  . Abnormal mammogram, unspecified 2014  . Breast cyst   . Stroke ([place]) 07/03/2014  . Mitral valve disorder     mitral valve repair  . SVT (supraventricular tachycardia) [facility])     Patient Active Problem List   Diagnosis Date Noted  . Dizziness 11/20/2015  . SVT (supraventricular tachycardia) ([place]) 05/01/2015  . Essential hypertension 04/30/2015  . Atrial flutter ([place]) 12/13/2014  . Fatigue 12/13/2014  . 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 S/P MVR (mitral valve repair) 09/20/2014  . CVA (cerebral infarction) 07/03/2014  . Atrial fibrillation ([place]) 07/03/2014  . Stroke  ([place])   . Breast cyst 01/03/2013    Past Surgical History  Procedure Laterality Date  . Abdominal hysterectomy  2004  . Eye surgery  2007  . Mitral valve repair  05/2014  . Radiology with anesthesia N/A 07/03/2014    Procedure: RADIOLOGY WITH ANESTHESIA;  Surgeon: Rob Hickman, MD;  Location: [place];  Service: Radiology;  Laterality: N/A;  . Cardioversion N/A 09/21/2014    Procedure: CARDIOVERSION;  Surgeon: Josue Hector, MD;  Location: [facility] ENDOSCOPY;  Service: Cardiovascular;  Laterality: N/A;  . Electrophysiologic study N/A 11/28/2014    Procedure: Cardioversion;  Surgeon: Teodoro Spray, MD;  Location: [place] CV LAB;  Service: Cardiovascular;  Laterality: N/A;  . Electrophysiologic study N/A 12/27/2014    Procedure: A-Flutter;  Surgeon: Evans Lance, MD;  Location: [place] CV LAB;  Service: Cardiovascular;  Laterality: N/A;  . Electrophysiologic study N/A 11/28/2014    Procedure: Cardioversion;  Surgeon: Teodoro Spray, MD;  Location: [facility] ORS;  Service: Cardiovascular;  Laterality: N/A;  Have discussed with Dr. Marcello Moores  . Breast cyst aspiration  15 YRS AGO    Current Outpatient Rx  Name  Route  Sig  Dispense  Refill  . apixaban (ELIQUIS) 5 MG TABS tablet   Oral   Take 1 tablet (5 mg total) by mouth 2 (two) times daily.   60 tablet   2   . carvedilol (COREG) 3.125 MG tablet   Oral   Take 1 tablet (3.125 mg total) by mouth 2 (two) times daily.   [place]  tablet   3   . cholecalciferol (VITAMIN D) 400 units TABS tablet   Oral   Take 400 Units by mouth daily.         . magnesium oxide (MAG-OX) 400 (241.3 MG) MG tablet   Oral   Take 1 tablet (400 mg total) by mouth 2 (two) times daily.   60 tablet   5   . Melatonin 3 MG TABS   Oral   Take 1 tablet by mouth at bedtime as needed.         . sertraline (ZOLOFT) 50 MG tablet   Oral   Take 50 mg by mouth at bedtime.            Allergies Sulfa antibiotics  Family History  Problem Relation Age of Onset    . Cancer      BOTH SIDES OF THE FAMILY  . Diabetes      MATERNAL SIDE OF THE FAMILY  . Cancer Father   . Breast cancer Maternal Aunt     Social History Social History  Substance Use Topics  . Smoking status: Never Smoker   . Smokeless tobacco: Never Used     Comment: quit smoking in her 30's  . Alcohol Use: No    Review of Systems Constitutional: No fever/chills Eyes: No visual changes. ENT: No sore throat. Cardiovascular: As above Respiratory: Denies shortness of breath. Gastrointestinal: No abdominal pain.  No nausea, no vomiting.  No diarrhea.  No constipation. Genitourinary: Negative for dysuria. Musculoskeletal: Negative for back pain. Skin: Negative for rash. Neurological: Negative for headaches, focal weakness or numbness.  10-point ROS otherwise negative.  ____________________________________________   PHYSICAL EXAM:  VITAL SIGNS: ED Triage Vitals  Enc Vitals Group     BP 12/03/15 1150 94/35 mmHg     Pulse Rate 12/03/15 1150 174     Resp 12/03/15 1150 20     Temp 12/03/15 1150 97.2 F (36.2 C)     Temp Source 12/03/15 1150 Oral     SpO2 12/03/15 1150 100 %     Weight 12/03/15 1150 149 lb (67.586 kg)     Height --      Head Cir --      Peak Flow --      Pain Score 12/03/15 1147 0     Pain Loc --      Pain Edu? --      Excl. in [place]? --     Constitutional: Alert and oriented. Well appearing and in no acute distress. Eyes: Conjunctivae are normal. PERRL. EOMI. Head: Atraumatic. Nose: No congestion/rhinnorhea. Mouth/Throat: Mucous membranes are moist.  Mallampati of 3.   Neck: No stridor.   Cardiovascular: Tachycardic, regular rhythm. Grossly normal heart sounds.  Good peripheral circulation. Respiratory: Normal respiratory effort.  No retractions. Lungs CTAB. Gastrointestinal: Soft and nontender. No distention.  Musculoskeletal: No lower extremity tenderness nor edema.  No joint effusions. Neurologic:  Normal speech and language. No gross focal  neurologic deficits are appreciated. No gait instability. Skin:  Skin is warm, dry and intact. No rash noted. Psychiatric: Mood and affect are normal. Speech and behavior are normal.  ____________________________________________   LABS (all labs ordered are listed, but only abnormal results are displayed)  Labs Reviewed  BASIC METABOLIC PANEL - Abnormal; Notable for the following:    BUN 21 (*)    All other components within normal limits  CBC WITH DIFFERENTIAL/PLATELET  MAGNESIUM   ____________________________________________  EKG  ED ECG REPORT I, Tan Clopper,  Youlanda Roys, the attending physician, personally viewed and interpreted this ECG.   Date: 12/03/2015  EKG Time: 1150  Rate: 172  Rhythm: SVT  Axis: Left axis deviation  Intervals:none  ST&T Change: Mild ST elevation in V1 as well as V2 with depressions which are mild in V5 and V6. I suspect these changes are rate related  ED ECG REPORT I, Deangela Randleman,  Youlanda Roys, the attending physician, personally viewed and interpreted this ECG.   Date: 12/03/2015  EKG Time: 1205  Rate: Approximately 55  Rhythm: sinus bradycardia with PVCs  Intervals:Borderline wide complex QRS  ST&T Change: No ST segment elevation or depression. No abnormal T-wave inversion.  ED ECG REPORT I, Doran Stabler, the attending physician, personally viewed and interpreted this ECG.   Date: 12/03/2015  EKG Time: 1207  Rate: 77  Rhythm: normal sinus rhythm with PVCs  Axis: Normal axis  Intervals:none  ST&T Change: No ST segment elevation or depression. T-wave inversion in lead V2. V2 T-wave inversion possibly secondary to lead placement. This illness and again change from EKG done on 11/20/2015.  ED ECG REPORT I, Doran Stabler, the attending physician, personally viewed and interpreted this ECG.   Date: 12/03/2015  EKG Time: 1310  Rate: 47  Rhythm: sinus bradycardia with PVCs  Axis: Normal axis  Intervals:none  ST&T Change: No ST  segment elevation or depression. T-wave inversions in aVL as well as V2.   ____________________________________________  RADIOLOGY   ____________________________________________   PROCEDURES  Procedural sedation Performed by: Doran Stabler Consent: Verbal consent obtained. Risks and benefits: risks, benefits and alternatives were discussed Required items: required blood products, implants, devices, and special equipment available Patient identity confirmed: arm band and provided demographic data Time out: Immediately prior to procedure a "time out" was called to verify the correct patient, procedure, equipment, support staff and site/side marked as required.  Sedation type: moderate (conscious) sedation NPO time confirmed and considedered  Sedatives: Etomidate   Physician Time at Bedside: 65minutes  Vitals: Vital signs were monitored during sedation. Cardiac Monitor, pulse oximeter Patient tolerance: Patient tolerated the procedure well with no immediate complications. Comments: Pt with uneventful recovered. Returned to pre-procedural sedation baseline  ELECTRIC CARDIOVERSION Performed by: Doran Stabler Consent:  The procedure was performed in an emergent situation.  Verbal consent was obtained.  Written consent was obtained. Risks and benefits: risks, benefits and alternatives were discussed Consent given by: patient Patient understanding: patient states understanding of the procedure being performed Patient consent: the patient's understanding of the procedure matches consent given Required items: required blood products, implants, devices, and special equipment available Patient identity confirmed: verbally with patient Patient sedated: yes Sedation type: moderate (conscious) sedation Sedatives: etomidate Analgesia: fentanyl Cardioversion basis: emergent Pre-procedure rhythm: SVT Patient position: patient was placed in a supine position Chest area:  chest area exposed Electrodes: pads Electrodes placed: anterior-posterior Number of attempts: 1 Attempt 1 mode: synchronous Attempt 1 waveform: biphasic Attempt 1 shock (in Joules): 75 Attempt 1 outcome: conversion to normal sinus rhythm Post-procedure rhythm: normal sinus rhythm with several nonsustained runs of ventricular tachycardia Complications: ----------------------------------------- 1:09 PM on 12/03/2015 -----------------------------------------  At this time the patient is awake and alert but feels weak and intermittently nauseous. Says that she usually feels that her denies after cardioversions but this feels much different to her. She is denying any chest pain or shortness of breath.   sPatient tolerance: Patient tolerated the procedure well and was sedated adequately.  CRITICAL CARE Performed by: Doran Stabler   Total critical care time: 35 minutes  Critical care time was exclusive of separately billable procedures and treating other patients.  Critical care was necessary to treat or prevent imminent or life-threatening deterioration.  Critical care was time spent personally by me on the following activities: development of treatment plan with patient and/or surrogate as well as nursing, discussions with consultants, evaluation of patient's response to treatment, examination of patient, obtaining history from patient or surrogate, ordering and performing treatments and interventions, ordering and review of laboratory studies, ordering and review of radiographic studies, pulse oximetry and re-evaluation of patient's condition.    ____________________________________________   INITIAL IMPRESSION / ASSESSMENT AND PLAN / ED COURSE  Pertinent labs & imaging results that were available during my care of the patient were reviewed by me and considered in my medical decision making (see chart for details).  ----------------------------------------- 1210 PM on  12/03/2015 ----------------------------------------- Discussed the procedure with Dr. Caryl Comes after the patient had been cardioverted and he recommends follow-up with the patient's doctor physiologist, Dr. Lovena Le in [place]. Dr. Caryl Comes does not want to make any medication adjustment at this time. He says that the patient's SVT and underlying atrial flutter a likely complication of her mitral valve replacement. He says he will let Dr. Lovena Le in [place] no about the events of today and that he may decide to start her on additional medications. I also discussed with Dr. Caryl Comes that the patient was having several runs of nonsustained ventricular tachycardia on the monitor status post cardioversion. Her longest run lasted probably about 5 seconds. Dr. Caryl Comes said that she was also having these runs after adenosine in the office.  ----------------------------------------- 1:11 PM on 12/03/2015 -----------------------------------------  Pending patient's electrolytes at this time. Patient continues to be weak all over. 4-5 strength diffusely without any facial droop. Still denying any pain. Neurologic exam is nonfocal.   ----------------------------------------- 1:48 PM on 12/03/2015 ----------------------------------------- Patient now feeling back to her baseline. No longer weak. No longer nauseous. Denies any pain. Able to ambulate without any tachycardia, weakness, dizziness or lightheadedness. Her heart rate at that time of standing up was in the 60s. I discussed with her return precautions including any worsening or concerning symptoms. She understands return to the emergency department immediately for any changes in her condition that appear to be negative. She is understanding of the plan to follow-up with her cardiologist/electrophysiologist, Dr. Lovena Le in [place]. She'll be discharged home.   ____________________________________________   FINAL CLINICAL IMPRESSION(S) / ED  DIAGNOSES  SVT.    NEW MEDICATIONS STARTED DURING THIS VISIT:  New Prescriptions   No medications on file     Note:  This document was prepared using Dragon voice recognition software and may include unintentional dictation errors.    Orbie Pyo, MD 12/03/15 1349  No further runs of ventricular tachycardia. Likely in the immediate post-cardioversion phase only. Possible postprocedure weakness secondary to medication use for sedation and pain control.  Orbie Pyo, MD 12/03/15 1352

## 2015-12-03 NOTE — ED Notes (Signed)
Pt states she does not "feel like she usually does after these shocks" states she feel nauseous and weak, pt diaphoretic on assessment, MD made aware

## 2015-12-03 NOTE — Progress Notes (Signed)
Patient Care Team: Idelle Crouch, MD as PCP - General (Internal Medicine) Minna Merritts, MD as PCP - Cardiology (Cardiology) Idelle Crouch, MD (Unknown Physician Specialty) Robert Bellow, MD (General Surgery) Robert Bellow, MD (General Surgery)   HPI  Colleen Williamson is a 70 y.o. female Presents with tachypalpitations. This began this morning. She takes ELIQUIS. She has a history of mitral valve surgery. She has a history of prior ablation of typical atrial flutter. She's also had recurrent episodes of SVT for which she has undergone cardioversion.  Echocardiogram 5/16 demonstrated near-normal LV function  Dr. Tanna Furry note from 12/16 were reviewed  Records and Results Reviewed  Past Medical History  Diagnosis Date  . Abnormal mammogram, unspecified 2014  . Breast cyst   . Stroke ([place]) 07/03/2014  . Mitral valve disorder     mitral valve repair  . SVT (supraventricular tachycardia) [facility])     Past Surgical History  Procedure Laterality Date  . Abdominal hysterectomy  2004  . Eye surgery  2007  . Mitral valve repair  05/2014  . Radiology with anesthesia N/A 07/03/2014    Procedure: RADIOLOGY WITH ANESTHESIA;  Surgeon: Rob Hickman, MD;  Location: [place];  Service: Radiology;  Laterality: N/A;  . Cardioversion N/A 09/21/2014    Procedure: CARDIOVERSION;  Surgeon: Josue Hector, MD;  Location: [facility] ENDOSCOPY;  Service: Cardiovascular;  Laterality: N/A;  . Electrophysiologic study N/A 11/28/2014    Procedure: Cardioversion;  Surgeon: Teodoro Spray, MD;  Location: [place] CV LAB;  Service: Cardiovascular;  Laterality: N/A;  . Electrophysiologic study N/A 12/27/2014    Procedure: A-Flutter;  Surgeon: Evans Lance, MD;  Location: [place] CV LAB;  Service: Cardiovascular;  Laterality: N/A;  . Electrophysiologic study N/A 11/28/2014    Procedure: Cardioversion;  Surgeon: Teodoro Spray, MD;  Location: [facility] ORS;  Service: Cardiovascular;   Laterality: N/A;  Have discussed with Dr. Marcello Moores  . Breast cyst aspiration  15 YRS AGO    Current Outpatient Prescriptions  Medication Sig Dispense Refill  . apixaban (ELIQUIS) 5 MG TABS tablet Take 1 tablet (5 mg total) by mouth 2 (two) times daily. 60 tablet 2  . carvedilol (COREG) 3.125 MG tablet Take 1 tablet (3.125 mg total) by mouth 2 (two) times daily. 180 tablet 3  . magnesium oxide (MAG-OX) 400 (241.3 MG) MG tablet Take 1 tablet (400 mg total) by mouth 2 (two) times daily. 60 tablet 5  . sertraline (ZOLOFT) 50 MG tablet Take 50 mg by mouth at bedtime.      No current facility-administered medications for this visit.    Allergies  Allergen Reactions  . Sulfa Antibiotics Nausea Only      Review of Systems negative except from HPI and PMH  Physical Exam Pulse 172  Ht 5\' 9"  (1.753 m)  Wt 149 lb 8 oz (67.813 kg)  BMI 22.07 kg/m2 Well developed and well nourished in no acute distress HENT normal E scleral and icterus clear Neck Supple JVP flat; carotids brisk and full Clear to ausculation regular rate and rhythm Rapid but regular rate and rhythm, no murmurs gallops or rub Soft with active bowel sounds No clubbing cyanosis  Edema Alert and oriented, grossly normal motor and sensory function Skin Warm and Dry  ECG demonstrates a narrow QRS tachycardia 170 bpm. Carotid massage failed to affect the tachycardia. Adenosine was given demonstrating atypical flutter/atrial tachycardia  Assessment and  Plan  Atypical atrial  flutter/tachycardia  Mitral valve surgery    Adenosine demonstrated the underlying atrial arrhythmia. She was referred to the emergency room where she was to be submitted for cardioversion.  Follow-up with Dr. Lovena Le will be arranged to discuss next steps, antiarrhythmic therapy versus catheter ablation  She has a history of prior exposure to a 1C agent; I did not see that in the most recent notes.

## 2015-12-03 NOTE — Addendum Note (Signed)
Addended by: Valora Corporal on: 12/03/2015 01:11 PM   Modules accepted: Orders

## 2015-12-03 NOTE — ED Notes (Signed)
MD at bedside. 

## 2015-12-03 NOTE — ED Notes (Signed)
MD at bedside, informed pt of procedure, Pt signed consent form. MD Schaevitz given 10 etomidate

## 2015-12-03 NOTE — Sedation Documentation (Signed)
Shock delivered to pt, HR now 66BPM

## 2015-12-03 NOTE — Progress Notes (Signed)
Placed 20 gauge IV to right AC and patient was given 12 mg Adenosine by Dr. Caryl Comes.  Dr. Caryl Comes then instructed patient to go to [facility] Emergency Room for direct cardioversion. Called and spoke to ED charge nurse Melissa and let her know that patient would be coming that way by private vehicle with her grandson.

## 2015-12-03 NOTE — Telephone Encounter (Signed)
Spoke w/ pt.  She reports that she had an episode this am w/ her heart racing and profuse sweating. BP 95/79 HR 173, recheck 103/75, 144. She reports that she feels better, but not 100%. Asked her to recheck while on the phone w/ me: BP 118/84, HR 133. Asked pt to have someone bring her to the office for an EKG. She is agreeable and will have her ride bring her here now.

## 2015-12-03 NOTE — ED Notes (Addendum)
Pt sent from dr Gwenyth Ober office in [place].  Pt reports this am she awoke with heart racing. Pt with iv in place from drs office in right ac 20g sl.  Per pt they tried to give her meds at the office to cardiovert her but it was unsuccessful.  Pt with hr of 174 at triage, ekg done and pt taken straight to room 13.  Dr Clearnce Hasten at the bedside.

## 2015-12-03 NOTE — ED Notes (Signed)
Spoke with Colleen Williamson from dr Gwenyth Ober office.  She states pt was told to go to [facility] and be seen in their ed per dr Caryl Comes.  Leafy Ro states they were aware that pt had IV in her arm and also aware of pt hr at 170's.

## 2015-12-03 NOTE — Discharge Instructions (Signed)
Paroxysmal Supraventricular Tachycardia °Paroxysmal supraventricular tachycardia (PSVT) is when your heart beats very quickly and then suddenly stops beating so quickly. You may or may not have any symptoms when this occurs. It is usually not dangerous. It can lead to problems if it happens often or it lasts a long time. °HOME CARE  °· Take medicines only as told by your doctor. °· Avoid caffeine or limit how much of it you consume as told by your doctor. Caffeine is found in coffee, tea, soda, and chocolate. °· Avoid alcohol or limit how much of it you drink as told by your doctor. °· Do not smoke. °· Try to get at least 7 hours of sleep each night. °· Find healthy ways to reduce stress. °· Do self-treatments as told by your doctor to slow down your heart (vagus nerve stimulation). Your doctor may tell you to: °¨ Hold your breath and push, as though you are going to the bathroom. °¨ Rub an area on one side of your neck. °¨ Bend forward with your head between your legs. °¨ Bend forward with your head between your legs, then cough. °¨ Rub your eyeballs with your eyes closed. °· Maintain a healthy weight. °· Get some exercise on most days. Ask your doctor about some good activities for you. °GET HELP IF: °· You are having episodes of a fast heartbeat more often. °· Your episodes are lasting longer. °· Your self-treatments to slow down your heart are no longer helping. °· You have new symptoms during an episode. °GET HELP RIGHT AWAY IF: °· You have chest pain. °· You have trouble breathing. °· You have an episode of a fast heartbeat that lasts longer than 20 minutes. °· You pass out (faint). °These symptoms may be an emergency. Do not wait to see if the symptoms will go away. Get medical help right away. Call your local emergency services (911 in the [place] Do not drive yourself to the hospital. °  °This information is not intended to replace advice given to you by your health care provider. Make sure you discuss any  questions you have with your health care provider. °  °Document Released: 07/13/2005 Document Revised: 08/03/2014 Document Reviewed: 12/21/2013 °Elsevier Interactive Patient Education ©2016 [employer] ° °

## 2015-12-17 ENCOUNTER — Ambulatory Visit (INDEPENDENT_AMBULATORY_CARE_PROVIDER_SITE_OTHER): Payer: PPO | Admitting: Internal Medicine

## 2015-12-17 ENCOUNTER — Encounter: Payer: Self-pay | Admitting: Internal Medicine

## 2015-12-17 VITALS — BP 130/88 | HR 58 | Ht 69.0 in | Wt 149.4 lb

## 2015-12-17 DIAGNOSIS — I484 Atypical atrial flutter: Secondary | ICD-10-CM

## 2015-12-17 NOTE — Patient Instructions (Signed)
Medication Instructions:  Your physician recommends that you continue on your current medications as directed. Please refer to the Current Medication list given to you today.   Labwork: None ordered   Testing/Procedures: None ordered   Follow-Up: Your physician recommends that you schedule a follow-up appointment in: 3 months with Dr Taylor   Any Other Special Instructions Will Be Listed Below (If Applicable).     If you need a refill on your cardiac medications before your next appointment, please call your pharmacy.   

## 2015-12-17 NOTE — Progress Notes (Signed)
HPI Colleen Williamson returns today after being admitted for palpitations and having SVT and undergoing DCCV. She is a pleasant 70 yo woman with mitral valve disease who underwent mitral valve repair over a year ago. She had post op atrial fib by her report and sustained a stroke. She had recurrent atrial flutter and several DC cardioversions. She underwent catheter ablation of typical atrial flutter several months ago. She has done well but has had 2 episodes of sustained tachycardia which are caused by atrial flutter with 2:1 conduction demonstrated by giving IV adenosine. Dr. Olin Pia note did not mention whether this was 2:1 or 1:1 atrial flutter, I suspect the former. She has felt well in the interim. Previously she has had a metallic taste in her mouth when she tried flecainide.   Allergies  Allergen Reactions  . Sulfa Antibiotics Nausea Only     Current Outpatient Prescriptions  Medication Sig Dispense Refill  . apixaban (ELIQUIS) 5 MG TABS tablet Take 1 tablet (5 mg total) by mouth 2 (two) times daily. 60 tablet 2  . carvedilol (COREG) 3.125 MG tablet Take 1 tablet (3.125 mg total) by mouth 2 (two) times daily. 180 tablet 3  . cholecalciferol (VITAMIN D) 400 units TABS tablet Take 400 Units by mouth daily.    . magnesium oxide (MAG-OX) 400 (241.3 MG) MG tablet Take 1 tablet (400 mg total) by mouth 2 (two) times daily. 60 tablet 5  . Melatonin 3 MG TABS Take 1 tablet by mouth at bedtime as needed (sleep).     . sertraline (ZOLOFT) 50 MG tablet Take 50 mg by mouth at bedtime.      No current facility-administered medications for this visit.     Past Medical History  Diagnosis Date  . Abnormal mammogram, unspecified 2014  . Breast cyst   . Stroke ([place]) 07/03/2014  . Mitral valve disorder     mitral valve repair  . SVT (supraventricular tachycardia) (HCC)     ROS:   All systems reviewed and negative except as noted in the HPI.   Past Surgical History  Procedure Laterality  Date  . Abdominal hysterectomy  2004  . Eye surgery  2007  . Mitral valve repair  05/2014  . Radiology with anesthesia N/A 07/03/2014    Procedure: RADIOLOGY WITH ANESTHESIA;  Surgeon: Rob Hickman, MD;  Location: [facility];  Service: Radiology;  Laterality: N/A;  . Cardioversion N/A 09/21/2014    Procedure: CARDIOVERSION;  Surgeon: Josue Hector, MD;  Location: [facility] ENDOSCOPY;  Service: Cardiovascular;  Laterality: N/A;  . Electrophysiologic study N/A 11/28/2014    Procedure: Cardioversion;  Surgeon: Teodoro Spray, MD;  Location: [place] [facility];  Service: Cardiovascular;  Laterality: N/A;  . Electrophysiologic study N/A 12/27/2014    Procedure: A-Flutter;  Surgeon: Evans Lance, MD;  Location: [place] [facility];  Service: Cardiovascular;  Laterality: N/A;  . Electrophysiologic study N/A 11/28/2014    Procedure: Cardioversion;  Surgeon: Teodoro Spray, MD;  Location: [facility] ORS;  Service: Cardiovascular;  Laterality: N/A;  Have discussed with Dr. Marcello Moores  . Breast cyst aspiration  15 YRS AGO     Family History  Problem Relation Age of Onset  . Cancer      BOTH SIDES OF THE FAMILY  . Diabetes      MATERNAL SIDE OF THE FAMILY  . Cancer Father   . Breast cancer Maternal Aunt      Social History   Social  History  . Marital Status: Widowed    Spouse Name: N/A  . Number of Children: 3  . Years of Education: 12+   Occupational History  . Not on file.   Social History Main Topics  . Smoking status: Never Smoker   . Smokeless tobacco: Never Used     Comment: quit smoking in her 30's  . Alcohol Use: No  . Drug Use: No  . Sexual Activity: Not on file   Other Topics Concern  . Not on file   Social History Narrative   Patient is widowed with 3 children.   Patient is right handed.   Patient has hs education plus 1-2 yrs of college.   Patient drinks 1 cup daily.     BP 130/88 mmHg  Pulse 58  Ht 5\' 9"  (1.753 m)  Wt 149 lb 6.4 oz (67.767 kg)  BMI 22.05 kg/m2  Physical  Exam:  Well appearing 70 yo woman, NAD HEENT: Unremarkable Neck:  6 cm JVD, no thyromegally Back:  No CVA tenderness Lungs:  Clear with no wheezes HEART:  Regular rate rhythm, no murmurs, no rubs, no clicks Abd:  soft, positive bowel sounds, no organomegally, no rebound, no guarding Ext:  2 plus pulses, no edema, no cyanosis, no clubbing Skin:  No rashes no nodules Neuro:  CN II through XII intact, motor grossly intact  EKG - sinus bradycardia rare pvc   Assess/Plan: 1. Atypical atrial flutter - we discussed the treatment options today. Medical therapy, ablation therapy, and watchful waiting were all reviewed. For now, she would like to hold off on AA drug therapy. If she has recurrent flutter, then I would start Rhythmol as she had a metallic taste with flecainide. 2. Typical atrial flutter -she is s/p ablation with no recurrence 3. HTN - her blood pressure is reasonably well controlled. I considered uptitrating her coreg but she has had hypotension in the past on higher doses of meds so I will hold off on this for now. 4. Anti-coagulation - she will continue her Eliquis. She has had no bleeding.  Mikle Bosworth.D.

## 2015-12-24 ENCOUNTER — Ambulatory Visit: Payer: PPO | Admitting: Physician Assistant

## 2016-01-14 DIAGNOSIS — M9901 Segmental and somatic dysfunction of cervical region: Secondary | ICD-10-CM | POA: Diagnosis not present

## 2016-01-14 DIAGNOSIS — M9902 Segmental and somatic dysfunction of thoracic region: Secondary | ICD-10-CM | POA: Diagnosis not present

## 2016-01-14 DIAGNOSIS — M5384 Other specified dorsopathies, thoracic region: Secondary | ICD-10-CM | POA: Diagnosis not present

## 2016-01-14 DIAGNOSIS — M531 Cervicobrachial syndrome: Secondary | ICD-10-CM | POA: Diagnosis not present

## 2016-01-31 DIAGNOSIS — M5384 Other specified dorsopathies, thoracic region: Secondary | ICD-10-CM | POA: Diagnosis not present

## 2016-01-31 DIAGNOSIS — M9901 Segmental and somatic dysfunction of cervical region: Secondary | ICD-10-CM | POA: Diagnosis not present

## 2016-01-31 DIAGNOSIS — M9902 Segmental and somatic dysfunction of thoracic region: Secondary | ICD-10-CM | POA: Diagnosis not present

## 2016-01-31 DIAGNOSIS — M531 Cervicobrachial syndrome: Secondary | ICD-10-CM | POA: Diagnosis not present

## 2016-02-07 DIAGNOSIS — M9902 Segmental and somatic dysfunction of thoracic region: Secondary | ICD-10-CM | POA: Diagnosis not present

## 2016-02-07 DIAGNOSIS — M531 Cervicobrachial syndrome: Secondary | ICD-10-CM | POA: Diagnosis not present

## 2016-02-07 DIAGNOSIS — M5384 Other specified dorsopathies, thoracic region: Secondary | ICD-10-CM | POA: Diagnosis not present

## 2016-02-07 DIAGNOSIS — M9901 Segmental and somatic dysfunction of cervical region: Secondary | ICD-10-CM | POA: Diagnosis not present

## 2016-02-10 ENCOUNTER — Ambulatory Visit: Payer: PPO | Admitting: Internal Medicine

## 2016-03-03 ENCOUNTER — Encounter: Payer: Self-pay | Admitting: Internal Medicine

## 2016-03-18 ENCOUNTER — Ambulatory Visit (INDEPENDENT_AMBULATORY_CARE_PROVIDER_SITE_OTHER): Payer: PPO | Admitting: Internal Medicine

## 2016-03-18 ENCOUNTER — Encounter: Payer: Self-pay | Admitting: Internal Medicine

## 2016-03-18 VITALS — BP 124/84 | HR 63 | Ht 69.0 in | Wt 156.6 lb

## 2016-03-18 DIAGNOSIS — I484 Atypical atrial flutter: Secondary | ICD-10-CM

## 2016-03-18 DIAGNOSIS — I471 Supraventricular tachycardia: Secondary | ICD-10-CM | POA: Diagnosis not present

## 2016-03-18 NOTE — Progress Notes (Signed)
HPI Colleen Williamson returns today after being admitted for palpitations and having SVT and undergoing DCCV. She is a pleasant 70 yo woman with mitral valve disease who underwent mitral valve repair over 2 years ago. She had post op atrial fib by her report and sustained a stroke. She had recurrent atrial flutter and several DC cardioversions. She underwent catheter ablation of typical atrial flutter. She has felt well in the interim. She is off of all AA drugs and has been arrhythmia free.  Allergies  Allergen Reactions  . Sulfa Antibiotics Nausea Only     Current Outpatient Prescriptions  Medication Sig Dispense Refill  . apixaban (ELIQUIS) 5 MG TABS tablet Take 1 tablet (5 mg total) by mouth 2 (two) times daily. 60 tablet 2  . carvedilol (COREG) 3.125 MG tablet Take 1 tablet (3.125 mg total) by mouth 2 (two) times daily. 180 tablet 3  . cholecalciferol (VITAMIN D) 400 units TABS tablet Take 400 Units by mouth daily.    . magnesium oxide (MAG-OX) 400 (241.3 MG) MG tablet Take 1 tablet (400 mg total) by mouth 2 (two) times daily. 60 tablet 5   No current facility-administered medications for this visit.      Past Medical History:  Diagnosis Date  . Abnormal mammogram, unspecified 2014  . Breast cyst   . Mitral valve disorder    mitral valve repair  . Stroke ([place]) 07/03/2014  . SVT (supraventricular tachycardia) (HCC)     ROS:   All systems reviewed and negative except as noted in the HPI.   Past Surgical History:  Procedure Laterality Date  . ABDOMINAL HYSTERECTOMY  2004  . BREAST CYST ASPIRATION  15 YRS AGO  . CARDIOVERSION N/A 09/21/2014   Procedure: CARDIOVERSION;  Surgeon: Josue Hector, MD;  Location: [facility];  Service: Cardiovascular;  Laterality: N/A;  . ELECTROPHYSIOLOGIC STUDY N/A 11/28/2014   Procedure: Cardioversion;  Surgeon: Teodoro Spray, MD;  Location: [place] [facility];  Service: Cardiovascular;  Laterality: N/A;  . ELECTROPHYSIOLOGIC STUDY N/A  12/27/2014   Procedure: A-Flutter;  Surgeon: Evans Lance, MD;  Location: [place] [facility];  Service: Cardiovascular;  Laterality: N/A;  . ELECTROPHYSIOLOGIC STUDY N/A 11/28/2014   Procedure: Cardioversion;  Surgeon: Teodoro Spray, MD;  Location: [facility] ORS;  Service: Cardiovascular;  Laterality: N/A;  Have discussed with Dr. Marcello Moores  . EYE SURGERY  2007  . MITRAL VALVE REPAIR  05/2014  . RADIOLOGY WITH ANESTHESIA N/A 07/03/2014   Procedure: RADIOLOGY WITH ANESTHESIA;  Surgeon: Rob Hickman, MD;  Location: [facility];  Service: Radiology;  Laterality: N/A;     Family History  Problem Relation Age of Onset  . Cancer Father   . Cancer      BOTH SIDES OF THE FAMILY  . Diabetes      MATERNAL SIDE OF THE FAMILY  . Breast cancer Maternal Aunt      Social History   Social History  . Marital status: Widowed    Spouse name: N/A  . Number of children: 3  . Years of education: 12+   Occupational History  . Not on file.   Social History Main Topics  . Smoking status: Former Research scientist (life sciences)  . Smokeless tobacco: Never Used     Comment: quit smoking in her 20's  . Alcohol use No  . Drug use: No  . Sexual activity: Not on file   Other Topics Concern  . Not on file   Social  History Narrative   Patient is widowed with 3 children.   Patient is right handed.   Patient has hs education plus 1-2 yrs of college.   Patient drinks 1 cup daily.     BP 124/84   Pulse 63   Ht 5\' 9"  (1.753 m)   Wt 156 lb 9.6 oz (71 kg)   BMI 23.13 kg/m   Physical Exam:  Well appearing 70 yo woman, NAD HEENT: Unremarkable Neck:  6 cm JVD, no thyromegally Back:  No CVA tenderness Lungs:  Clear with no wheezes HEART:  Regular rate rhythm, no murmurs, no rubs, no clicks Abd:  soft, positive bowel sounds, no organomegally, no rebound, no guarding Ext:  2 plus pulses, no edema, no cyanosis, no clubbing Skin:  No rashes no nodules Neuro:  CN II through XII intact, motor grossly intact  EKG - NSR     Assess/Plan: 1. Atypical atrial flutter - she is asymptomatic and will undergo watchful waiting. Would consider rhythmol if she has recurrence 2. Typical atrial flutter -she is s/p ablation with no recurrence 3. HTN - her blood pressure is well controlled. Continue low dose coreg. 4. Anti-coagulation - she will continue her Eliquis. She has had no bleeding.  Mikle Bosworth.D.

## 2016-03-18 NOTE — Patient Instructions (Signed)

## 2016-04-21 ENCOUNTER — Other Ambulatory Visit: Payer: Self-pay | Admitting: Internal Medicine

## 2016-04-21 DIAGNOSIS — I10 Essential (primary) hypertension: Secondary | ICD-10-CM

## 2016-05-20 ENCOUNTER — Telehealth: Payer: Self-pay | Admitting: Cardiovascular Disease

## 2016-05-20 DIAGNOSIS — Z79899 Other long term (current) drug therapy: Secondary | ICD-10-CM | POA: Diagnosis not present

## 2016-05-20 DIAGNOSIS — I10 Essential (primary) hypertension: Secondary | ICD-10-CM | POA: Diagnosis not present

## 2016-05-20 DIAGNOSIS — E559 Vitamin D deficiency, unspecified: Secondary | ICD-10-CM | POA: Diagnosis not present

## 2016-05-20 NOTE — Telephone Encounter (Signed)
Spoke with patient samples available of Eliquis 5 mg Lot # [id number] Exp. 3/20.

## 2016-05-20 NOTE — Telephone Encounter (Signed)
Patient calling the office for samples of medication:   1.  What medication and dosage are you requesting samples for? Eliquis 5 mg   2.  Are you currently out of this medication?   She has about a 3 days worth left  She is in [place]

## 2016-05-27 DIAGNOSIS — E559 Vitamin D deficiency, unspecified: Secondary | ICD-10-CM | POA: Diagnosis not present

## 2016-05-27 DIAGNOSIS — Z79899 Other long term (current) drug therapy: Secondary | ICD-10-CM | POA: Diagnosis not present

## 2016-05-27 DIAGNOSIS — R5381 Other malaise: Secondary | ICD-10-CM | POA: Diagnosis not present

## 2016-05-27 DIAGNOSIS — Z23 Encounter for immunization: Secondary | ICD-10-CM | POA: Diagnosis not present

## 2016-05-27 DIAGNOSIS — R42 Dizziness and giddiness: Secondary | ICD-10-CM | POA: Diagnosis not present

## 2016-05-27 DIAGNOSIS — R5383 Other fatigue: Secondary | ICD-10-CM | POA: Diagnosis not present

## 2016-05-27 DIAGNOSIS — R400 Somnolence: Secondary | ICD-10-CM | POA: Diagnosis not present

## 2016-05-27 DIAGNOSIS — Z1329 Encounter for screening for other suspected endocrine disorder: Secondary | ICD-10-CM | POA: Diagnosis not present

## 2016-05-27 DIAGNOSIS — F3341 Major depressive disorder, recurrent, in partial remission: Secondary | ICD-10-CM | POA: Diagnosis not present

## 2016-05-27 DIAGNOSIS — I10 Essential (primary) hypertension: Secondary | ICD-10-CM | POA: Diagnosis not present

## 2016-05-27 DIAGNOSIS — Z13220 Encounter for screening for lipoid disorders: Secondary | ICD-10-CM | POA: Diagnosis not present

## 2016-06-08 ENCOUNTER — Telehealth: Payer: Self-pay | Admitting: Cardiovascular Disease

## 2016-06-08 NOTE — Telephone Encounter (Signed)
Patient notified samples available for Eliquis 5 mg Lot# [id number] Exp. 3-20, 4 boxes given.

## 2016-06-08 NOTE — Telephone Encounter (Signed)
Patient calling the office for samples of medication:   1.  What medication and dosage are you requesting samples for? Eliquis 5 mg 2.  Are you currently out of this medication?  Pt only has about 3 days left

## 2016-06-29 ENCOUNTER — Telehealth: Payer: Self-pay | Admitting: Cardiovascular Disease

## 2016-06-29 NOTE — Telephone Encounter (Signed)
Patient calling the office for samples of medication:   1.  What medication and dosage are you requesting samples for? eliquis 5 mg  2.  Are you currently out of this medication?  Few more left

## 2016-06-30 NOTE — Telephone Encounter (Signed)
Eliquis 5 mg samples place at front desk for pick up.

## 2016-07-07 DIAGNOSIS — L309 Dermatitis, unspecified: Secondary | ICD-10-CM | POA: Diagnosis not present

## 2016-07-09 ENCOUNTER — Telehealth: Payer: Self-pay

## 2016-07-09 NOTE — Telephone Encounter (Signed)
Pt would like Eliquis samples. Pt states she only needs 1 box

## 2016-07-09 NOTE — Telephone Encounter (Signed)
Eliquis 5 mg samples placed at front desk for pick up. 

## 2016-07-11 ENCOUNTER — Inpatient Hospital Stay
Admission: EM | Admit: 2016-07-11 | Discharge: 2016-07-13 | DRG: 309 | Disposition: A | Discharge status: Home / Self Care | Payer: PPO | Attending: Internal Medicine | Admitting: Internal Medicine

## 2016-07-11 ENCOUNTER — Emergency Department
Admission: EM | Admit: 2016-07-11 | Discharge: 2016-07-11 | Disposition: A | Discharge status: Home / Self Care | Payer: PPO | Source: Home / Self Care | Attending: Emergency Medicine | Admitting: Emergency Medicine

## 2016-07-11 ENCOUNTER — Encounter: Payer: Self-pay | Admitting: Emergency Medicine

## 2016-07-11 DIAGNOSIS — I484 Atypical atrial flutter: Secondary | ICD-10-CM | POA: Diagnosis not present

## 2016-07-11 DIAGNOSIS — Z803 Family history of malignant neoplasm of breast: Secondary | ICD-10-CM

## 2016-07-11 DIAGNOSIS — I248 Other forms of acute ischemic heart disease: Secondary | ICD-10-CM | POA: Diagnosis present

## 2016-07-11 DIAGNOSIS — Z833 Family history of diabetes mellitus: Secondary | ICD-10-CM

## 2016-07-11 DIAGNOSIS — R778 Other specified abnormalities of plasma proteins: Secondary | ICD-10-CM

## 2016-07-11 DIAGNOSIS — I471 Supraventricular tachycardia, unspecified: Secondary | ICD-10-CM | POA: Diagnosis present

## 2016-07-11 DIAGNOSIS — I4892 Unspecified atrial flutter: Secondary | ICD-10-CM | POA: Insufficient documentation

## 2016-07-11 DIAGNOSIS — R002 Palpitations: Secondary | ICD-10-CM | POA: Diagnosis not present

## 2016-07-11 DIAGNOSIS — Z882 Allergy status to sulfonamides status: Secondary | ICD-10-CM | POA: Diagnosis not present

## 2016-07-11 DIAGNOSIS — Z7901 Long term (current) use of anticoagulants: Secondary | ICD-10-CM | POA: Diagnosis not present

## 2016-07-11 DIAGNOSIS — I4891 Unspecified atrial fibrillation: Principal | ICD-10-CM | POA: Insufficient documentation

## 2016-07-11 DIAGNOSIS — I10 Essential (primary) hypertension: Secondary | ICD-10-CM

## 2016-07-11 DIAGNOSIS — R7989 Other specified abnormal findings of blood chemistry: Secondary | ICD-10-CM

## 2016-07-11 DIAGNOSIS — Z87891 Personal history of nicotine dependence: Secondary | ICD-10-CM | POA: Insufficient documentation

## 2016-07-11 DIAGNOSIS — R55 Syncope and collapse: Secondary | ICD-10-CM

## 2016-07-11 DIAGNOSIS — Z8673 Personal history of transient ischemic attack (TIA), and cerebral infarction without residual deficits: Secondary | ICD-10-CM | POA: Diagnosis not present

## 2016-07-11 DIAGNOSIS — Z79899 Other long term (current) drug therapy: Secondary | ICD-10-CM

## 2016-07-11 DIAGNOSIS — I480 Paroxysmal atrial fibrillation: Secondary | ICD-10-CM | POA: Diagnosis present

## 2016-07-11 DIAGNOSIS — R42 Dizziness and giddiness: Secondary | ICD-10-CM | POA: Diagnosis not present

## 2016-07-11 DIAGNOSIS — R748 Abnormal levels of other serum enzymes: Secondary | ICD-10-CM | POA: Diagnosis not present

## 2016-07-11 DIAGNOSIS — R000 Tachycardia, unspecified: Secondary | ICD-10-CM | POA: Diagnosis not present

## 2016-07-11 LAB — COMPREHENSIVE METABOLIC PANEL
ALBUMIN: 4.5 g/dL (ref 3.5–5.0)
ALT: 54 U/L (ref 14–54)
AST: 78 U/L — AB (ref 15–41)
Alkaline Phosphatase: 101 U/L (ref 38–126)
Anion gap: 9 (ref 5–15)
BUN: 17 mg/dL (ref 6–20)
CHLORIDE: 104 mmol/L (ref 101–111)
CO2: 24 mmol/L (ref 22–32)
CREATININE: 0.75 mg/dL (ref 0.44–1.00)
Calcium: 9.6 mg/dL (ref 8.9–10.3)
GFR calc Af Amer: >60 mL/min (ref >60)
Glucose, Bld: 179 mg/dL — ABNORMAL HIGH (ref 65–99)
POTASSIUM: 4.1 mmol/L (ref 3.5–5.1)
Sodium: 137 mmol/L (ref 135–145)
Total Bilirubin: 1.6 mg/dL — ABNORMAL HIGH (ref 0.3–1.2)
Total Protein: 7.6 g/dL (ref 6.5–8.1)

## 2016-07-11 LAB — CBC WITH DIFFERENTIAL/PLATELET
BASOS ABS: 0.1 10*3/uL (ref 0–0.1)
Basophils Relative: 2 %
Eosinophils Absolute: 0.4 10*3/uL (ref 0–0.7)
Eosinophils Relative: 7 %
HEMATOCRIT: 48.1 % — AB (ref 35.0–47.0)
HEMOGLOBIN: 16.2 g/dL — AB (ref 12.0–16.0)
LYMPHS PCT: 45 %
Lymphs Abs: 2.4 10*3/uL (ref 1.0–3.6)
MCH: 28.6 pg (ref 26.0–34.0)
MCHC: 33.6 g/dL (ref 32.0–36.0)
MCV: 85.1 fL (ref 80.0–100.0)
Monocytes Absolute: 0.3 10*3/uL (ref 0.2–0.9)
Monocytes Relative: 7 %
NEUTROS ABS: 2 10*3/uL (ref 1.4–6.5)
NEUTROS PCT: 39 %
Platelets: 223 10*3/uL (ref 150–440)
RBC: 5.65 MIL/uL — AB (ref 3.80–5.20)
RDW: 13.8 % (ref 11.5–14.5)
WBC: 5.2 10*3/uL (ref 3.6–11.0)

## 2016-07-11 LAB — BASIC METABOLIC PANEL
ANION GAP: 8 (ref 5–15)
BUN: 18 mg/dL (ref 6–20)
CO2: 26 mmol/L (ref 22–32)
Calcium: 9.4 mg/dL (ref 8.9–10.3)
Chloride: 105 mmol/L (ref 101–111)
Creatinine, Ser: 0.78 mg/dL (ref 0.44–1.00)
GFR calc non Af Amer: >60 mL/min (ref >60)
GLUCOSE: 167 mg/dL — AB (ref 65–99)
POTASSIUM: 4 mmol/L (ref 3.5–5.1)
Sodium: 139 mmol/L (ref 135–145)

## 2016-07-11 LAB — TROPONIN I: Troponin I: <0.03 ng/mL (ref <0.03)

## 2016-07-11 LAB — CBC
HEMATOCRIT: 50.1 % — AB (ref 35.0–47.0)
Hemoglobin: 16.4 g/dL — ABNORMAL HIGH (ref 12.0–16.0)
MCH: 28.6 pg (ref 26.0–34.0)
MCHC: 32.8 g/dL (ref 32.0–36.0)
MCV: 87.2 fL (ref 80.0–100.0)
PLATELETS: 239 10*3/uL (ref 150–440)
RBC: 5.75 MIL/uL — AB (ref 3.80–5.20)
RDW: 13.8 % (ref 11.5–14.5)
WBC: 8.6 10*3/uL (ref 3.6–11.0)

## 2016-07-11 LAB — TSH: TSH: 2.255 u[IU]/mL (ref 0.350–4.500)

## 2016-07-11 LAB — MAGNESIUM: MAGNESIUM: 2 mg/dL (ref 1.7–2.4)

## 2016-07-11 LAB — GLUCOSE, CAPILLARY: GLUCOSE-CAPILLARY: 107 mg/dL — AB (ref 65–99)

## 2016-07-11 MED ORDER — DILTIAZEM HCL 25 MG/5ML IV SOLN
INTRAVENOUS | Status: AC
  Filled 2016-07-11: qty 5

## 2016-07-11 MED ORDER — ADENOSINE 6 MG/2ML IV SOLN
6.0000 mg | Freq: Once | INTRAVENOUS | Status: AC
  Administered 2016-07-11: 6 mg via INTRAVENOUS

## 2016-07-11 MED ORDER — CARVEDILOL 6.25 MG PO TABS
3.1250 mg | ORAL_TABLET | Freq: Once | ORAL | Status: AC
  Administered 2016-07-11: 3.125 mg via ORAL

## 2016-07-11 MED ORDER — ENOXAPARIN SODIUM 40 MG/0.4ML ~~LOC~~ SOLN
40.0000 mg | SUBCUTANEOUS | Status: DC
  Administered 2016-07-11: 40 mg via SUBCUTANEOUS
  Filled 2016-07-11: qty 0.4

## 2016-07-11 MED ORDER — CARVEDILOL 3.125 MG PO TABS
3.1250 mg | ORAL_TABLET | Freq: Two times a day (BID) | ORAL | Status: DC
  Administered 2016-07-11 – 2016-07-13 (×4): 3.125 mg via ORAL
  Filled 2016-07-11 (×4): qty 1

## 2016-07-11 MED ORDER — SODIUM CHLORIDE 0.9 % IV BOLUS (SEPSIS)
1000.0000 mL | Freq: Once | INTRAVENOUS | Status: AC
  Administered 2016-07-11: 1000 mL via INTRAVENOUS

## 2016-07-11 MED ORDER — CARVEDILOL 6.25 MG PO TABS
12.5000 mg | ORAL_TABLET | Freq: Two times a day (BID) | ORAL | Status: DC
  Filled 2016-07-11: qty 2

## 2016-07-11 MED ORDER — BISACODYL 10 MG RE SUPP: 10.0000 mg | Freq: Every day | RECTAL | Status: DC | PRN

## 2016-07-11 MED ORDER — AMIODARONE HCL IN DEXTROSE 360-4.14 MG/200ML-% IV SOLN
INTRAVENOUS | Status: AC
  Administered 2016-07-11: 150 mg via INTRAVENOUS
  Filled 2016-07-11: qty 200

## 2016-07-11 MED ORDER — DOCUSATE SODIUM 100 MG PO CAPS
100.0000 mg | ORAL_CAPSULE | Freq: Two times a day (BID) | ORAL | Status: DC
  Administered 2016-07-11 – 2016-07-12 (×2): 100 mg via ORAL
  Filled 2016-07-11 (×3): qty 1

## 2016-07-11 MED ORDER — SODIUM CHLORIDE 0.9 % IV BOLUS (SEPSIS)
500.0000 mL | Freq: Once | INTRAVENOUS | Status: AC
  Administered 2016-07-11: 500 mL via INTRAVENOUS

## 2016-07-11 MED ORDER — AMIODARONE HCL IN DEXTROSE 360-4.14 MG/200ML-% IV SOLN
60.0000 mg/h | INTRAVENOUS | Status: AC
  Administered 2016-07-11 (×2): 60 mg/h via INTRAVENOUS

## 2016-07-11 MED ORDER — ONDANSETRON HCL 4 MG/2ML IJ SOLN: 4.0000 mg | Freq: Four times a day (QID) | INTRAMUSCULAR | Status: DC | PRN

## 2016-07-11 MED ORDER — SODIUM CHLORIDE 0.9% FLUSH
3.0000 mL | Freq: Two times a day (BID) | INTRAVENOUS | Status: DC
  Administered 2016-07-11 – 2016-07-12 (×3): 3 mL via INTRAVENOUS

## 2016-07-11 MED ORDER — AMIODARONE LOAD VIA INFUSION
150.0000 mg | Freq: Once | INTRAVENOUS | Status: AC
  Administered 2016-07-11: 150 mg via INTRAVENOUS
  Filled 2016-07-11: qty 83.34

## 2016-07-11 MED ORDER — ACETAMINOPHEN 650 MG RE SUPP: 650.0000 mg | Freq: Four times a day (QID) | RECTAL | Status: DC | PRN

## 2016-07-11 MED ORDER — DILTIAZEM HCL 25 MG/5ML IV SOLN: 15.0000 mg | Freq: Once | INTRAVENOUS | Status: DC

## 2016-07-11 MED ORDER — ADENOSINE 12 MG/4ML IV SOLN
12.0000 mg | Freq: Once | INTRAVENOUS | Status: AC
  Administered 2016-07-11: 12 mg via INTRAVENOUS

## 2016-07-11 MED ORDER — MAGNESIUM OXIDE 400 (241.3 MG) MG PO TABS
400.0000 mg | ORAL_TABLET | Freq: Two times a day (BID) | ORAL | Status: DC
  Administered 2016-07-11 – 2016-07-13 (×4): 400 mg via ORAL
  Filled 2016-07-11 (×4): qty 1

## 2016-07-11 MED ORDER — ADENOSINE 12 MG/4ML IV SOLN
INTRAVENOUS | Status: AC
Start: 2016-07-11 — End: 2016-07-12
  Filled 2016-07-11: qty 4

## 2016-07-11 MED ORDER — ONDANSETRON HCL 4 MG PO TABS: 4.0000 mg | ORAL_TABLET | Freq: Four times a day (QID) | ORAL | Status: DC | PRN

## 2016-07-11 MED ORDER — CHOLECALCIFEROL 10 MCG (400 UNIT) PO TABS
400.0000 [IU] | ORAL_TABLET | Freq: Every day | ORAL | Status: DC
  Administered 2016-07-12 – 2016-07-13 (×2): 400 [IU] via ORAL
  Filled 2016-07-11 (×2): qty 1

## 2016-07-11 MED ORDER — APIXABAN 5 MG PO TABS
5.0000 mg | ORAL_TABLET | Freq: Two times a day (BID) | ORAL | Status: DC
  Administered 2016-07-11 – 2016-07-13 (×4): 5 mg via ORAL
  Filled 2016-07-11 (×4): qty 1

## 2016-07-11 MED ORDER — ONDANSETRON HCL 4 MG/2ML IJ SOLN
INTRAMUSCULAR | Status: AC
  Administered 2016-07-11: 4 mg via INTRAVENOUS
  Filled 2016-07-11: qty 2

## 2016-07-11 MED ORDER — ADENOSINE 6 MG/2ML IV SOLN
INTRAVENOUS | Status: AC
  Filled 2016-07-11: qty 2

## 2016-07-11 MED ORDER — AMIODARONE HCL IN DEXTROSE 360-4.14 MG/200ML-% IV SOLN
30.0000 mg/h | INTRAVENOUS | Status: DC
  Administered 2016-07-12 – 2016-07-13 (×4): 30 mg/h via INTRAVENOUS
  Filled 2016-07-11 (×4): qty 200

## 2016-07-11 MED ORDER — ACETAMINOPHEN 325 MG PO TABS: 650.0000 mg | ORAL_TABLET | Freq: Four times a day (QID) | ORAL | Status: DC | PRN

## 2016-07-11 MED ORDER — ONDANSETRON HCL 4 MG/2ML IJ SOLN
4.0000 mg | Freq: Once | INTRAMUSCULAR | Status: AC
  Administered 2016-07-11: 4 mg via INTRAVENOUS

## 2016-07-11 MED ORDER — INSULIN ASPART 100 UNIT/ML ~~LOC~~ SOLN: 0.0000 [IU] | Freq: Three times a day (TID) | SUBCUTANEOUS | Status: DC

## 2016-07-11 MED ORDER — SODIUM CHLORIDE 0.9 % IV SOLN
INTRAVENOUS | Status: DC
  Administered 2016-07-11: 23:00:00 via INTRAVENOUS

## 2016-07-11 NOTE — Discharge Instructions (Signed)
If you have chest pain, shortness of breath or you feel worse in any way return to the emergency room.

## 2016-07-11 NOTE — ED Provider Notes (Signed)
[facility] Emergency Department Provider Note   ____________________________________________    I have reviewed the triage vital signs and the nursing notes.   HISTORY  Chief Complaint Palpitations   HPI Colleen Williamson is a 70 y.o. female who presents with complaints of "not feeling well and fluttering in her chest". She was seen here earlier today for SVT which converted spontaneously. Patient has a long history of arrhythmias, including SVT and atrial fibrillation. Her EP is Dr. Caryl Comes. She denies chest pain. She does feel dizzy when standing. She reports fluttering started again after several hours of being at home. No diaphoresis, no nausea or vomiting or shortness of breath.   Past Medical History:  Diagnosis Date  . Abnormal mammogram, unspecified 2014  . Breast cyst   . Mitral valve disorder    mitral valve repair  . Stroke ([place]) 07/03/2014  . SVT (supraventricular tachycardia) [facility])     Patient Active Problem List   Diagnosis Date Noted  . Dizziness 11/20/2015  . SVT (supraventricular tachycardia) ([place]) 05/01/2015  . Essential hypertension 04/30/2015  . Atrial flutter ([place]) 12/13/2014  . Fatigue 12/13/2014  . 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 S/P MVR (mitral valve repair) 09/20/2014  . CVA (cerebral infarction) 07/03/2014  . Atrial fibrillation ([place]) 07/03/2014  . Stroke ([place])   . Breast cyst 01/03/2013    Past Surgical History:  Procedure Laterality Date  . ABDOMINAL HYSTERECTOMY  2004  . BREAST CYST ASPIRATION  15 YRS AGO  . CARDIOVERSION N/A 09/21/2014   Procedure: CARDIOVERSION;  Surgeon: Josue Hector, MD;  Location: [facility];  Service: Cardiovascular;  Laterality: N/A;  . ELECTROPHYSIOLOGIC STUDY N/A 11/28/2014   Procedure: Cardioversion;  Surgeon: Teodoro Spray, MD;  Location: [place] [facility];  Service: Cardiovascular;  Laterality: N/A;  . ELECTROPHYSIOLOGIC  STUDY N/A 12/27/2014   Procedure: A-Flutter;  Surgeon: Evans Lance, MD;  Location: [place] [facility];  Service: Cardiovascular;  Laterality: N/A;  . ELECTROPHYSIOLOGIC STUDY N/A 11/28/2014   Procedure: Cardioversion;  Surgeon: Teodoro Spray, MD;  Location: [facility] ORS;  Service: Cardiovascular;  Laterality: N/A;  Have discussed with Dr. Marcello Moores  . EYE SURGERY  2007  . MITRAL VALVE REPAIR  05/2014  . RADIOLOGY WITH ANESTHESIA N/A 07/03/2014   Procedure: RADIOLOGY WITH ANESTHESIA;  Surgeon: Rob Hickman, MD;  Location: [facility];  Service: Radiology;  Laterality: N/A;    Prior to Admission medications   Medication Sig Start Date End Date Taking? Authorizing Provider  apixaban (ELIQUIS) 5 MG TABS tablet Take 1 tablet (5 mg total) by mouth 2 (two) times daily. 07/09/14   Donzetta Starch, NP  carvedilol (COREG) 3.125 MG tablet TAKE 1 TABLET (3.125 MG TOTAL) BY MOUTH 2 (TWO) TIMES DAILY. 04/21/16   Evans Lance, MD  cholecalciferol (VITAMIN D) 400 units TABS tablet Take 400 Units by mouth daily.    Historical Provider, MD  magnesium oxide (MAG-OX) 400 (241.3 MG) MG tablet Take 1 tablet (400 mg total) by mouth 2 (two) times daily. 05/02/15   Idelle Crouch, MD     Allergies Sulfa antibiotics  Family History  Problem Relation Age of Onset  . Cancer Father   . Cancer      BOTH SIDES OF THE FAMILY  . Diabetes      MATERNAL SIDE OF THE FAMILY  . Breast cancer Maternal Aunt     Social History Social  History  Substance Use Topics  . Smoking status: Former Research scientist (life sciences)  . Smokeless tobacco: Never Used     Comment: quit smoking in her 20's  . Alcohol use No    Review of Systems  Constitutional: No fever/chills, Dizziness as above Eyes: No visual changes.   Cardiovascular: Denies chest pain.Palpitations as above Respiratory: Denies shortness of breath. Gastrointestinal: No abdominal pain.  No nausea, no vomiting.   Genitourinary: Negative for dysuria. Musculoskeletal: Negative for back  pain.  Neurological: Negative for headaches or weakness  10-point ROS otherwise negative.  ____________________________________________   PHYSICAL EXAM:  VITAL SIGNS: ED Triage Vitals  Enc Vitals Group     BP 07/11/16 1905 91/77     Pulse Rate 07/11/16 1905 (!) 197     Resp 07/11/16 1905 18     Temp 07/11/16 1905 97.7 F (36.5 C)     Temp Source 07/11/16 1905 Oral     SpO2 07/11/16 1905 99 %     Weight 07/11/16 1907 160 lb (72.6 kg)     Height 07/11/16 1907 5\' 9"  (1.753 m)     Head Circumference --      Peak Flow --      Pain Score --      Pain Loc --      Pain Edu? --      Excl. in [place]? --     Constitutional: Alert and oriented. Ill-appearing. Pleasant and interactive Eyes: Conjunctivae are normal.   Nose: No congestion/rhinnorhea. Mouth/Throat: Mucous membranes are moist.    Cardiovascular: Tachycardia, regular rhythm. Kermit Balo peripheral circulation. Respiratory: Normal respiratory effort.  No retractions. Lungs CTAB. Gastrointestinal: Soft and nontender. No distention.  No CVA tenderness. Genitourinary: deferred Musculoskeletal: No lower extremity tenderness nor edema.  Warm and well perfused Neurologic:  Normal speech and language. No gross focal neurologic deficits are appreciated.  Skin:  Skin is warm, dry and intact. No rash noted. Psychiatric: Mood and affect are normal. Speech and behavior are normal.  ____________________________________________   LABS (all labs ordered are listed, but only abnormal results are displayed)  Labs Reviewed  CBC - Abnormal; Notable for the following:       Result Value   RBC 5.75 (*)    Hemoglobin 16.4 (*)    HCT 50.1 (*)    All other components within normal limits  COMPREHENSIVE METABOLIC PANEL - Abnormal; Notable for the following:    Glucose, Bld 179 (*)    AST 78 (*)    Total Bilirubin 1.6 (*)    All other components within normal limits  TROPONIN I   ____________________________________________  EKG  ED  ECG REPORT I, Lavonia Drafts, the attending physician, personally viewed and interpreted this ECG.  Date: 07/11/2016 EKG Time: 6:51 PM Rate: 147 Rhythm: normal sinus rhythm QRS Axis: Left axis deviation Intervals: Abnormal ST/T Wave abnormalities: Nonspecific  Narrative Interpretation: Atrial fibrillation with rapid ventricular response  ED ECG REPORT I, Lavonia Drafts, the attending physician, personally viewed and interpreted this ECG.  Date: 07/11/2016 EKG Time: 7:02 PM Rate: 194 Rhythm: SVT QRS Axis: normal Intervals: normal ST/T Wave abnormalities: Nonspecific Conduction Disturbances: none  ED ECG REPORT I, Lavonia Drafts, the attending physician, personally viewed and interpreted this ECG.  Date: 07/11/2016 EKG Time: 7:23 Rate: 101 Rhythm: Sinus rhythm QRS Axis: normal  ST/T Wave abnormalities: normal Conduction Disturbances: PVCs      ____________________________________________  RADIOLOGY  None ____________________________________________   PROCEDURES  Procedure(s) performed: None    Critical  Care performed: yes  CRITICAL CARE Performed by: Lavonia Drafts   Total critical care time: 30 minutes  Critical care time was exclusive of separately billable procedures and treating other patients.  Critical care was necessary to treat or prevent imminent or life-threatening deterioration.  Critical care was time spent personally by me on the following activities: development of treatment plan with patient and/or surrogate as well as nursing, discussions with consultants, evaluation of patient's response to treatment, examination of patient, obtaining history from patient or surrogate, ordering and performing treatments and interventions, ordering and review of laboratory studies, ordering and review of radiographic studies, pulse oximetry and re-evaluation of patient's condition.  ____________________________________________   INITIAL IMPRESSION  / ASSESSMENT AND PLAN / ED COURSE  Pertinent labs & imaging results that were available during my care of the patient were reviewed by me and considered in my medical decision making (see chart for details).  Patient presents with tachycardia, initial EKG appeared consistent with atrial fibrillation with rapid ventricular response however bedtime she got to her room her heart rate increased and was more consistent with SVT on EKG. We placed pads on the patient and put her on a cardiac monitor.. Attempted Valsalva maneuvers initially which were unsuccessful. We did give adenosine 6 mg with no response. Then gave 12 mg of adenosine with brief pause but rate quickly went back into the 190s . Again attempted Valsalva maneuvers while drawing a Cardizem, this time Valsalva maneuvers appeared to work and the patient went into a sinus rhythm on the monitor. Cardizem was not given. Blood pressure improved  Clinical Course   Given that the patient was seen earlier today and was discharged but rapidly went back into her arrhythmia feel she needs to be admitted this time. We'll discuss with cardiology and admit to the hospitalist service  Discussed with Dr. Aundra Dubin of Cardiology, he recommends amiodarone drip overnight. ____________________________________________   FINAL CLINICAL IMPRESSION(S) / ED DIAGNOSES  Final diagnoses:  Atrial fibrillation with RVR (HCC)  SVT (supraventricular tachycardia) ([place])      NEW MEDICATIONS STARTED DURING THIS VISIT:  New Prescriptions   No medications on file     Note:  This document was prepared using Dragon voice recognition software and may include unintentional dictation errors.    Lavonia Drafts, MD 07/11/16 2045

## 2016-07-11 NOTE — Progress Notes (Signed)
MEDICATION RELATED CONSULT NOTE - INITIAL   Pharmacy Consult for drug-drug interactions with amiodarone   Allergies  Allergen Reactions  . Sulfa Antibiotics Nausea Only    Patient Measurements: Height: 5\' 9"  (175.3 cm) Weight: 160 lb (72.6 kg) IBW/kg (Calculated) : 66.2  Vital Signs: Temp: 97.7 F (36.5 C) (12/16 1905) Temp Source: Oral (12/16 1905) BP: 115/79 (12/16 2101) Pulse Rate: 100 (12/16 2101) Intake/Output from previous day: No intake/output data recorded. Intake/Output from this shift: No intake/output data recorded.  Labs:  Recent Labs  07/11/16 0824 07/11/16 1913  WBC 5.2 8.6  HGB 16.2* 16.4*  HCT 48.1* 50.1*  PLT 223 239  CREATININE 0.78 0.75  MG 2.0  --   ALBUMIN  --  4.5  PROT  --  7.6  AST  --  78*  ALT  --  54  ALKPHOS  --  101  BILITOT  --  1.6*   Estimated Creatinine Clearance: 70.3 mL/min (by C-G formula based on SCr of 0.75 mg/dL).   Microbiology: No results found for this or any previous visit (from the past 720 hour(s)).  Medical History: Past Medical History:  Diagnosis Date  . Abnormal mammogram, unspecified 2014  . Breast cyst   . Mitral valve disorder    mitral valve repair  . Stroke ([place]) 07/03/2014  . SVT (supraventricular tachycardia) (HCC)     Medications:   (Not in a hospital admission) Scheduled:  . [START ON 07/12/2016] insulin aspart  0-9 Units Subcutaneous TID WC   Infusions:  . sodium chloride    . amiodarone 60 mg/hr (07/11/16 2059)   Followed by  . [START ON 07/12/2016] amiodarone      Assessment: 70 yo female started on amiodarone drip in ED. Pharmacy consulted to identify drug-drug interactions and recommend medication adjustments for those altered by adding amiodarone to the medication regimen.   Patient's home meds include: Apixaban,  Carvedilol, magOx, and Vitamin D.   Plan: Pharmacy has reviewed patients current medications for possible drug-drug interactions. There are no dosing  recommendation or medications that need to be changed at this time.   Pernell Dupre, PharmD, BCPS Clinical Pharmacist 07/11/2016 9:22 PM

## 2016-07-11 NOTE — ED Triage Notes (Signed)
Patient to ED via POV, patient states that she has a hx/o SVT, patient woke up this morning and felt that her heart was racing, EKG done upon arrival, patient found to be in SVT with a rate of 198 bmp.

## 2016-07-11 NOTE — ED Triage Notes (Signed)
Patient presents to the ED with nausea, palpitations, and diaphoresis that has been intermittent today.  Patient has long history of heart arrhythmias and states this is usually how she feels.  Reports having to be cardioverted in the past.  Patient appears pale and skin is cool and clammy.  Patient is alert and oriented.  Reports attempting valsalva maneuvers.

## 2016-07-11 NOTE — Care Management Obs Status (Signed)
[facility] NOTIFICATION   Patient Details  Name: Colleen Williamson MRN: [id number] Date of Birth: 17-Jun-1946   Medicare Observation Status Notification Given:  Yes    CrutchfieldAntony Haste, RN 07/11/2016, 9:25 PM

## 2016-07-11 NOTE — ED Notes (Signed)
Patient arrived to ED room 7 from triage at approximately [phone], patient was pale, diaphoretic, and weak. Patient reports history of SVT. Patient states that upon waking up this morning around 0730 she felt like her heart was racing.  EKG done in triage showed patient to be in SVT with rate of 198 bmp, Dr. Burlene Arnt was at bedside with patient and had patient perform valsalva maneuver which converted patient back into sinus rhythm.   After valsalva maneuver, patients color improved as did her diaphoresis and weakness. Patient currently in sinus rhythm with a rate of 104 bmp. Will continue to monitor patient.

## 2016-07-11 NOTE — ED Provider Notes (Addendum)
[facility] Emergency Department Provider Note  ____________________________________________   I have reviewed the triage vital signs and the nursing notes.   HISTORY  Chief Complaint SVT    HPI Colleen Williamson is a 70 y.o. female presents today complaining of SVT. Patient has history of proximal atrial fibrillation, as well as repair of a mitral valve in the past 3 she also has atrial flutter with ablation appears in the past. Patient presents today complaining of tachycardia and feeling palpitations. Denies chest pain or shortness of breath. When she woke up this point. Did not attempt Valsalva. She has had to be shocked out of in the past. She denies any other symptoms. She does drink coffee and a regular basis has not had more than that, denies flu medication or flu symptoms and denies any change in her baseline caffeine ingestion.     Past Medical History:  Diagnosis Date  . Abnormal mammogram, unspecified 2014  . Breast cyst   . Mitral valve disorder    mitral valve repair  . Stroke ([place]) 07/03/2014  . SVT (supraventricular tachycardia) [facility])     Patient Active Problem List   Diagnosis Date Noted  . Dizziness 11/20/2015  . SVT (supraventricular tachycardia) ([place]) 05/01/2015  . Essential hypertension 04/30/2015  . Atrial flutter ([place]) 12/13/2014  . Fatigue 12/13/2014  . 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 S/P MVR (mitral valve repair) 09/20/2014  . CVA (cerebral infarction) 07/03/2014  . Atrial fibrillation ([place]) 07/03/2014  . Stroke ([place])   . Breast cyst 01/03/2013    Past Surgical History:  Procedure Laterality Date  . ABDOMINAL HYSTERECTOMY  2004  . BREAST CYST ASPIRATION  15 YRS AGO  . CARDIOVERSION N/A 09/21/2014   Procedure: CARDIOVERSION;  Surgeon: Josue Hector, MD;  Location: [place];  Service: Cardiovascular;  Laterality: N/A;  . ELECTROPHYSIOLOGIC STUDY N/A 11/28/2014    Procedure: Cardioversion;  Surgeon: Teodoro Spray, MD;  Location: [place] CV LAB;  Service: Cardiovascular;  Laterality: N/A;  . ELECTROPHYSIOLOGIC STUDY N/A 12/27/2014   Procedure: A-Flutter;  Surgeon: Evans Lance, MD;  Location: [place] CV LAB;  Service: Cardiovascular;  Laterality: N/A;  . ELECTROPHYSIOLOGIC STUDY N/A 11/28/2014   Procedure: Cardioversion;  Surgeon: Teodoro Spray, MD;  Location: [facility] ORS;  Service: Cardiovascular;  Laterality: N/A;  Have discussed with Dr. Marcello Moores  . EYE SURGERY  2007  . MITRAL VALVE REPAIR  05/2014  . RADIOLOGY WITH ANESTHESIA N/A 07/03/2014   Procedure: RADIOLOGY WITH ANESTHESIA;  Surgeon: Rob Hickman, MD;  Location: [place];  Service: Radiology;  Laterality: N/A;    Prior to Admission medications   Medication Sig Start Date End Date Taking? Authorizing Provider  apixaban (ELIQUIS) 5 MG TABS tablet Take 1 tablet (5 mg total) by mouth 2 (two) times daily. 07/09/14  Yes Donzetta Starch, NP  carvedilol (COREG) 3.125 MG tablet TAKE 1 TABLET (3.125 MG TOTAL) BY MOUTH 2 (TWO) TIMES DAILY. 04/21/16  Yes Evans Lance, MD  cholecalciferol (VITAMIN D) 400 units TABS tablet Take 400 Units by mouth daily.   Yes Historical Provider, MD  magnesium oxide (MAG-OX) 400 (241.3 MG) MG tablet Take 1 tablet (400 mg total) by mouth 2 (two) times daily. 05/02/15  Yes Idelle Crouch, MD    Allergies Sulfa antibiotics  Family History  Problem Relation Age of Onset  . Cancer Father   . Cancer  BOTH SIDES OF THE FAMILY  . Diabetes      MATERNAL SIDE OF THE FAMILY  . Breast cancer Maternal Aunt     Social History Social History  Substance Use Topics  . Smoking status: Former Research scientist (life sciences)  . Smokeless tobacco: Never Used     Comment: quit smoking in her 20's  . Alcohol use No    Review of Systems Constitutional: No fever/chills Eyes: No visual changes. ENT: No sore throat. No stiff neck no neck pain Cardiovascular: Denies chest pain. Respiratory:  Denies shortness of breath. Gastrointestinal:   no vomiting.  No diarrhea.  No constipation. Genitourinary: Negative for dysuria. Musculoskeletal: Negative lower extremity swelling Skin: Negative for rash. Neurological: Negative for severe headaches, focal weakness or numbness. 10-point ROS otherwise negative.  ____________________________________________   PHYSICAL EXAM:  VITAL SIGNS: ED Triage Vitals  Enc Vitals Group     BP 07/11/16 0823 118/89     Pulse Rate 07/11/16 0823 (!) 107     Resp 07/11/16 0823 16     Temp 07/11/16 0823 97.4 F (36.3 C)     Temp Source 07/11/16 0823 Oral     SpO2 07/11/16 0823 96 %     Weight 07/11/16 0824 160 lb (72.6 kg)     Height 07/11/16 0824 5\' 9"  (1.753 m)     Head Circumference --      Peak Flow --      Pain Score --      Pain Loc --      Pain Edu? --      Excl. in [place]? --     Constitutional: Alert and oriented. Well appearing and in no acute distress. Eyes: Conjunctivae are normal. PERRL. EOMI. Head: Atraumatic. Nose: No congestion/rhinnorhea. Mouth/Throat: Mucous membranes are moist.  Oropharynx non-erythematous. Neck: No stridor.   Nontender with no meningismus Cardiovascular:Initially, significant tachycardia noted, stopped without follow. Faint murmur appreciated systolic Respiratory: Normal respiratory effort.  No retractions. Lungs CTAB. Abdominal: Soft and nontender. No distention. No guarding no rebound Back:  There is no focal tenderness or step off.  there is no midline tenderness there are no lesions noted. there is no CVA tenderness Musculoskeletal: No lower extremity tenderness, no upper extremity tenderness. No joint effusions, no DVT signs strong distal pulses no edema Neurologic:  Normal speech and language. No gross focal neurologic deficits are appreciated.  Skin:  Skin is warm, dry and intact. No rash noted. Psychiatric: Mood and affect are normal. Speech and behavior are  normal.  ____________________________________________   LABS (all labs ordered are listed, but only abnormal results are displayed)  Labs Reviewed  CBC WITH DIFFERENTIAL/PLATELET - Abnormal; Notable for the following:       Result Value   RBC 5.65 (*)    Hemoglobin 16.2 (*)    HCT 48.1 (*)    All other components within normal limits  BASIC METABOLIC PANEL - Abnormal; Notable for the following:    Glucose, Bld 167 (*)    All other components within normal limits  TROPONIN I  TSH  MAGNESIUM   ____________________________________________  EKG  I personally interpreted any EKGs ordered by me or triage Rate 198 bpm QRS 160, likely bundle-branch block with what apparently rate related changes, Repeat EKG shows unclear rhythm, rate 105, no definitive P waves seen, no acute ischemic changes partial right bundle again noted. Repeat EKG again shows the same, rate 102, sinus tach for EKG machine, possibly also a flutter however. ____________________________________________  RADIOLOGY  I  reviewed any imaging ordered by me or triage that were performed during my shift and, if possible, patient and/or family made aware of any abnormal findings. ____________________________________________   PROCEDURES  Procedure(s) performed: None  Procedures  Critical Care performed: None  ____________________________________________   INITIAL IMPRESSION / ASSESSMENT AND PLAN / ED COURSE  Pertinent labs & imaging results that were available during my care of the patient were reviewed by me and considered in my medical decision making (see chart for details).  Patient had a looks like SVT. I did discuss with Dr. Marylou Mccoy, who agrees after looking at EKG. We did have the patient Valsalva the emergency room. She immediately brought. She has no chest pain or shortness of breath. Heart rate remains in the low 100s, 104 at this moment. Repeat EKG shows what is likely a atrial flutter. Patient is a  history of atrial flutter. Her pressures are fine she is actually asymptomatic with this. Cardiology feels that she does not require admission and they do not advise any change in her current medication. She is asymptomatic with this we'll send her home with close S patient follow-up. At no time did she have any chest pain, there were some rate related changes on her rapid EKG which resolved after Valsalva. Return precautions and follow-up given and understood. Patient has been advised not to have caffeine, she will take her regular medications, she will return if she feels worse and she will call Dr. Candis Musa first thing on Monday. Cardiology feels very comfortable with this plan.  ----------------------------------------- 12:11 PM on 07/11/2016 -----------------------------------------  Patient has not yet taken her carvedilol today. We will give her a dose and she will follow closely with cardiology. Asymptomatic here with no complaints.  Clinical Course    ____________________________________________   FINAL CLINICAL IMPRESSION(S) / ED DIAGNOSES  Final diagnoses:  None      This chart was dictated using voice recognition software.  Despite best efforts to proofread,  errors can occur which can change meaning.      Schuyler Amor, MD 07/11/16 [place], MD 07/11/16 [phone]

## 2016-07-11 NOTE — H&P (Signed)
History and Physical    Colleen Williamson [id number] DOB: [birth year] DOA: 07/11/2016  Referring physician: Dr. Corky Downs PCP: Idelle Crouch, MD  Specialists: Dr. Rockey Situ  Chief Complaint: palpitations with near syncope  [name] is a 70 y.o. female has a past medical history significant for s/p mitral valve repair, CV, and SVT now with recurrent episodes of palpitations and near syncope with dizziness. In ER, EKG's show both SVT and rapid a-fib. She denies CP or SOB. No fever. No actual syncope. She is now admitted after being started on an Amiodarone drip in the ER  Review of Systems: The patient denies anorexia, fever, weight loss,, vision loss, decreased hearing, hoarseness, chest pain, syncope, dyspnea on exertion, peripheral edema, balance deficits, hemoptysis, abdominal pain, melena, hematochezia, severe indigestion/heartburn, hematuria, incontinence, genital sores, muscle weakness, suspicious skin lesions, transient blindness, difficulty walking, depression, unusual weight change, abnormal bleeding, enlarged lymph nodes, angioedema, and breast masses.   Past Medical History:  Diagnosis Date  . Abnormal mammogram, unspecified 2014  . Breast cyst   . Mitral valve disorder    mitral valve repair  . Stroke ([place]) 07/03/2014  . SVT (supraventricular tachycardia) ([facility])    Past Surgical History:  Procedure Laterality Date  . ABDOMINAL HYSTERECTOMY  2004  . BREAST CYST ASPIRATION  15 YRS AGO  . CARDIOVERSION N/A 09/21/2014   Procedure: CARDIOVERSION;  Surgeon: Josue Hector, MD;  Location: [place];  Service: Cardiovascular;  Laterality: N/A;  . ELECTROPHYSIOLOGIC STUDY N/A 11/28/2014   Procedure: Cardioversion;  Surgeon: Teodoro Spray, MD;  Location: [place] [facility];  Service: Cardiovascular;  Laterality: N/A;  . ELECTROPHYSIOLOGIC STUDY N/A 12/27/2014   Procedure: A-Flutter;  Surgeon: Evans Lance, MD;  Location: [place] [facility];  Service: Cardiovascular;   Laterality: N/A;  . ELECTROPHYSIOLOGIC STUDY N/A 11/28/2014   Procedure: Cardioversion;  Surgeon: Teodoro Spray, MD;  Location: [facility] ORS;  Service: Cardiovascular;  Laterality: N/A;  Have discussed with Dr. Marcello Moores  . EYE SURGERY  2007  . MITRAL VALVE REPAIR  05/2014  . RADIOLOGY WITH ANESTHESIA N/A 07/03/2014   Procedure: RADIOLOGY WITH ANESTHESIA;  Surgeon: Rob Hickman, MD;  Location: [place];  Service: Radiology;  Laterality: N/A;   Social History:  reports that she has quit smoking. She has never used smokeless tobacco. She reports that she does not drink alcohol or use drugs.  Allergies  Allergen Reactions  . Sulfa Antibiotics Nausea Only    Family History  Problem Relation Age of Onset  . Cancer Father   . Cancer      BOTH SIDES OF THE FAMILY  . Diabetes      MATERNAL SIDE OF THE FAMILY  . Breast cancer Maternal Aunt     Prior to Admission medications   Medication Sig Start Date End Date Taking? Authorizing Provider  apixaban (ELIQUIS) 5 MG TABS tablet Take 1 tablet (5 mg total) by mouth 2 (two) times daily. 07/09/14   Donzetta Starch, NP  carvedilol (COREG) 3.125 MG tablet TAKE 1 TABLET (3.125 MG TOTAL) BY MOUTH 2 (TWO) TIMES DAILY. 04/21/16   Evans Lance, MD  cholecalciferol (VITAMIN D) 400 units TABS tablet Take 400 Units by mouth daily.    Historical Provider, MD  magnesium oxide (MAG-OX) 400 (241.3 MG) MG tablet Take 1 tablet (400 mg total) by mouth 2 (two) times daily. 05/02/15   Idelle Crouch, MD   Physical Exam: Vitals:   07/11/16 1905 07/11/16 1907  07/11/16 2030  BP: 91/77  118/83  Pulse: (!) 197  (!) 101  Resp: 18  15  Temp: 97.7 F (36.5 C)    TempSrc: Oral    SpO2: 99%  99%  Weight:  72.6 kg (160 lb)   Height:  5\' 9"  (1.753 m)      General:  No apparent distress, WDWN, [place]/AT  Eyes: PERRL, EOMI, no scleral icterus, conjunctiva clear  ENT: moist oropharynx without exudate, TM's benign, dentition good  Neck: supple, no lymphadenopathy. No  bruits or thyromegaly  Cardiovascular: rapid rate with regular rhythm without MRG; 2+ peripheral pulses, no JVD, no peripheral edema  Respiratory: CTA biL, good air movement without wheezing, rhonchi or crackled. Respiratory effort normal  Abdomen: soft, non tender to palpation, positive bowel sounds, no guarding, no rebound  Skin: no rashes or lesions  Musculoskeletal: normal bulk and tone, no joint swelling  Psychiatric: normal mood and affect, A&OX3  Neurologic: CN 2-12 grossly intact, Motor strength 5/5 in all 4 groups with symmetric DTR's and non-focal sensory exam  Labs on Admission:  Basic Metabolic Panel:  Recent Labs Lab 07/11/16 0824 07/11/16 1913  NA 139 137  K 4.0 4.1  CL 105 104  CO2 26 24  GLUCOSE 167* 179*  BUN 18 17  CREATININE 0.78 0.75  CALCIUM 9.4 9.6  MG 2.0  --    Liver Function Tests:  Recent Labs Lab 07/11/16 1913  AST 78*  ALT 54  ALKPHOS 101  BILITOT 1.6*  PROT 7.6  ALBUMIN 4.5   No results for input(s): LIPASE, AMYLASE in the last 168 hours. No results for input(s): AMMONIA in the last 168 hours. CBC:  Recent Labs Lab 07/11/16 0824 07/11/16 1913  WBC 5.2 8.6  NEUTROABS 2.0  --   HGB 16.2* 16.4*  HCT 48.1* 50.1*  MCV 85.1 87.2  PLT 223 239   Cardiac Enzymes:  Recent Labs Lab 07/11/16 0824 07/11/16 1913  TROPONINI <0.03 <0.03    BNP (last 3 results) No results for input(s): BNP in the last 8760 hours.  ProBNP (last 3 results) No results for input(s): PROBNP in the last 8760 hours.  CBG: No results for input(s): GLUCAP in the last 168 hours.  Radiological Exams on Admission: No results found.  EKG: Independently reviewed.  Assessment/Plan Principal Problem:   Atrial fibrillation [facility]) Active Problems:   SVT (supraventricular tachycardia) ([facility])   Dizziness   Near syncope   Will admit to telemetry on Amiodarone drip. Follow enzymes. Order echo and Cardiology consult. Repeat labs in AM.  Diet: clear  liquids Fluids: NS@75  DVT Prophylaxis: Lovenox  Code Status: FULL  Family Communication: yes  Disposition Plan: home  Time spent: 50 min

## 2016-07-11 NOTE — ED Notes (Signed)
Lab called for add on 

## 2016-07-12 ENCOUNTER — Observation Stay (HOSPITAL_BASED_OUTPATIENT_CLINIC_OR_DEPARTMENT_OTHER)
Admit: 2016-07-12 | Discharge: 2016-07-12 | Disposition: A | Discharge status: Home / Self Care | Payer: PPO | Attending: Internal Medicine | Admitting: Internal Medicine

## 2016-07-12 DIAGNOSIS — I248 Other forms of acute ischemic heart disease: Secondary | ICD-10-CM | POA: Diagnosis not present

## 2016-07-12 DIAGNOSIS — R42 Dizziness and giddiness: Secondary | ICD-10-CM | POA: Diagnosis not present

## 2016-07-12 DIAGNOSIS — I484 Atypical atrial flutter: Secondary | ICD-10-CM | POA: Diagnosis not present

## 2016-07-12 DIAGNOSIS — R7989 Other specified abnormal findings of blood chemistry: Secondary | ICD-10-CM

## 2016-07-12 DIAGNOSIS — R778 Other specified abnormalities of plasma proteins: Secondary | ICD-10-CM

## 2016-07-12 DIAGNOSIS — R748 Abnormal levels of other serum enzymes: Secondary | ICD-10-CM | POA: Diagnosis not present

## 2016-07-12 DIAGNOSIS — Z87891 Personal history of nicotine dependence: Secondary | ICD-10-CM | POA: Diagnosis not present

## 2016-07-12 DIAGNOSIS — R55 Syncope and collapse: Secondary | ICD-10-CM | POA: Diagnosis not present

## 2016-07-12 DIAGNOSIS — R002 Palpitations: Secondary | ICD-10-CM | POA: Diagnosis not present

## 2016-07-12 DIAGNOSIS — I4892 Unspecified atrial flutter: Secondary | ICD-10-CM | POA: Diagnosis not present

## 2016-07-12 DIAGNOSIS — Z882 Allergy status to sulfonamides status: Secondary | ICD-10-CM | POA: Diagnosis not present

## 2016-07-12 DIAGNOSIS — Z8673 Personal history of transient ischemic attack (TIA), and cerebral infarction without residual deficits: Secondary | ICD-10-CM | POA: Diagnosis not present

## 2016-07-12 DIAGNOSIS — Z803 Family history of malignant neoplasm of breast: Secondary | ICD-10-CM | POA: Diagnosis not present

## 2016-07-12 DIAGNOSIS — Z833 Family history of diabetes mellitus: Secondary | ICD-10-CM | POA: Diagnosis not present

## 2016-07-12 DIAGNOSIS — I4891 Unspecified atrial fibrillation: Secondary | ICD-10-CM | POA: Diagnosis not present

## 2016-07-12 DIAGNOSIS — Z7901 Long term (current) use of anticoagulants: Secondary | ICD-10-CM | POA: Diagnosis not present

## 2016-07-12 DIAGNOSIS — I471 Supraventricular tachycardia: Secondary | ICD-10-CM | POA: Diagnosis not present

## 2016-07-12 LAB — TROPONIN I
TROPONIN I: 0.03 ng/mL — AB (ref <0.03)
TROPONIN I: 0.05 ng/mL — AB (ref <0.03)
TROPONIN I: 0.06 ng/mL — AB (ref <0.03)

## 2016-07-12 LAB — CBC
HCT: 39.9 % (ref 35.0–47.0)
Hemoglobin: 13.4 g/dL (ref 12.0–16.0)
MCH: 28.6 pg (ref 26.0–34.0)
MCHC: 33.7 g/dL (ref 32.0–36.0)
MCV: 85 fL (ref 80.0–100.0)
PLATELETS: 180 10*3/uL (ref 150–440)
RBC: 4.69 MIL/uL (ref 3.80–5.20)
RDW: 13.7 % (ref 11.5–14.5)
WBC: 6 10*3/uL (ref 3.6–11.0)

## 2016-07-12 LAB — COMPREHENSIVE METABOLIC PANEL
ALT: 42 U/L (ref 14–54)
AST: 50 U/L — ABNORMAL HIGH (ref 15–41)
Albumin: 3.3 g/dL — ABNORMAL LOW (ref 3.5–5.0)
Alkaline Phosphatase: 77 U/L (ref 38–126)
Anion gap: 5 (ref 5–15)
BILIRUBIN TOTAL: 1.3 mg/dL — AB (ref 0.3–1.2)
BUN: 14 mg/dL (ref 6–20)
CALCIUM: 8.7 mg/dL — AB (ref 8.9–10.3)
CHLORIDE: 108 mmol/L (ref 101–111)
CO2: 26 mmol/L (ref 22–32)
CREATININE: 0.53 mg/dL (ref 0.44–1.00)
Glucose, Bld: 115 mg/dL — ABNORMAL HIGH (ref 65–99)
Potassium: 3.3 mmol/L — ABNORMAL LOW (ref 3.5–5.1)
Sodium: 139 mmol/L (ref 135–145)
TOTAL PROTEIN: 5.8 g/dL — AB (ref 6.5–8.1)

## 2016-07-12 LAB — GLUCOSE, CAPILLARY: GLUCOSE-CAPILLARY: 94 mg/dL (ref 65–99)

## 2016-07-12 LAB — ECHOCARDIOGRAM COMPLETE
Height: 69 in
WEIGHTICAEL: 2515.2 [oz_av]

## 2016-07-12 MED ORDER — SODIUM CHLORIDE 0.9% FLUSH: 3.0000 mL | Freq: Two times a day (BID) | INTRAVENOUS | Status: DC

## 2016-07-12 MED ORDER — SODIUM CHLORIDE 0.9 % IV SOLN
250.0000 mL | INTRAVENOUS | Status: DC
  Administered 2016-07-13: 13:00:00 via INTRAVENOUS

## 2016-07-12 MED ORDER — SODIUM CHLORIDE 0.9% FLUSH: 3.0000 mL | INTRAVENOUS | Status: DC | PRN

## 2016-07-12 MED ORDER — POTASSIUM CHLORIDE CRYS ER 20 MEQ PO TBCR
40.0000 meq | EXTENDED_RELEASE_TABLET | ORAL | Status: AC
  Administered 2016-07-12 (×2): 40 meq via ORAL
  Filled 2016-07-12 (×2): qty 2

## 2016-07-12 NOTE — Consult Note (Signed)
CARDIOLOGY CONSULT NOTE  Patient ID: Colleen Williamson MRN: [id number] DOB/AGE: 1946/03/28 70 y.o.  Admit date: 07/11/2016 Primary Cardiologist: Taylor/Gollan Reason for Consultation: SVT/atrial flutter  HPI: 70 yo with history of mitral valve prolapse/MR s/p MV repair, CVA, typical atrial flutter s/p ablation, atypical atrial flutter/atrial tachycardia presented to ER yesterday with palpitations associated with lightheadedness/presyncope.  This started on Saturday.  She came to the ER and was found to be in SVT with rate 190s-200.  She spontaneously decreased her rate to around 100, at that rate she clearly had atypical atrial flutter versus atrial tachycardia with 2:1 block.  She was sent home.  She returned to the ER with the same symptoms later on Saturday and was found to have SVT rate around 200 again.  She converted after valsalva and adenosine to same rhythm (atypical flutter versus AT with 2:1 block).  Amiodarone was started to try to maintain rhythm and she was admitted. HR currently around 100 bpm.  She feels fine today and wants to go home.   She has had no chest pain or dyspnea.  Only felt lightheaded when heart was racing. TnI mildly elevated at 0.06 maximal.   Review of systems complete and found to be negative unless listed above in HPI  Past Medical History: 1. Mitral valve repair: For mitral valve prolapse, at [facility] 2016.  - Echo (5/16): EF 50-55%, s/p MV repair with no significant regurgitation or stenosis.  2. Typical atrial flutter: s/p ablation.  3. H/o CVA 4. SVT: Atypical atrial flutter versus atrial tachycardia with 1:1 conduction. DCCV x 3 in past.    Family History  Problem Relation Age of Onset  . Cancer Father   . Cancer      BOTH SIDES OF THE FAMILY  . Diabetes      MATERNAL SIDE OF THE FAMILY  . Breast cancer Maternal Aunt     Social History   Social History  . Marital status: Widowed    Spouse name: N/A  . Number of children: 3  . Years of  education: 12+   Occupational History  . Not on file.   Social History Main Topics  . Smoking status: Former Research scientist (life sciences)  . Smokeless tobacco: Never Used     Comment: quit smoking in her 20's  . Alcohol use No  . Drug use: No  . Sexual activity: Not on file   Other Topics Concern  . Not on file   Social History Narrative   Patient is widowed with 3 children.   Patient is right handed.   Patient has hs education plus 1-2 yrs of college.   Patient drinks 1 cup daily.     Prescriptions Prior to Admission  Medication Sig Dispense Refill Last Dose  . apixaban (ELIQUIS) 5 MG TABS tablet Take 1 tablet (5 mg total) by mouth 2 (two) times daily. 60 tablet 2 07/10/2016 at Unknown time  . carvedilol (COREG) 3.125 MG tablet TAKE 1 TABLET (3.125 MG TOTAL) BY MOUTH 2 (TWO) TIMES DAILY. 180 tablet 3 07/10/2016 at Unknown time  . cholecalciferol (VITAMIN D) 400 units TABS tablet Take 400 Units by mouth daily.   07/10/2016 at Unknown time  . magnesium oxide (MAG-OX) 400 (241.3 MG) MG tablet Take 1 tablet (400 mg total) by mouth 2 (two) times daily. 60 tablet 5 07/10/2016 at Unknown time   Scheduled Meds: . apixaban  5 mg Oral BID  . carvedilol  3.125 mg Oral BID  . cholecalciferol  400 Units Oral Daily  . docusate sodium  100 mg Oral BID  . insulin aspart  0-9 Units Subcutaneous TID WC  . magnesium oxide  400 mg Oral BID  . potassium chloride  40 mEq Oral Q4H  . sodium chloride flush  3 mL Intravenous Q12H   Continuous Infusions: . amiodarone 30 mg/hr (07/12/16 0840)   PRN Meds:.acetaminophen **OR** acetaminophen, bisacodyl, ondansetron **OR** ondansetron (ZOFRAN) IV    Physical exam Blood pressure 122/65, pulse 100, temperature 98.7 F (37.1 C), temperature source Oral, resp. rate 19, height 5\' 9"  (1.753 m), weight 157 lb 3.2 oz (71.3 kg), SpO2 99 %. General: NAD Neck: No JVD, no thyromegaly or thyroid nodule.  Lungs: Clear to auscultation bilaterally with normal respiratory  effort. CV: Nondisplaced PMI.  Heart regular S1/S2, no S3/S4, no murmur.  No peripheral edema.  No carotid bruit.  Normal pedal pulses.  Abdomen: Soft, nontender, no hepatosplenomegaly, no distention.  Skin: Intact without lesions or rashes.  Neurologic: Alert and oriented x 3.  Psych: Normal affect. Extremities: No clubbing or cyanosis.  HEENT: Normal.   Labs:   Lab Results  Component Value Date   WBC 6.0 07/12/2016   HGB 13.4 07/12/2016   HCT 39.9 07/12/2016   MCV 85.0 07/12/2016   PLT 180 07/12/2016    Recent Labs Lab 07/12/16 0040  NA 139  K 3.3*  CL 108  CO2 26  BUN 14  CREATININE 0.53  CALCIUM 8.7*  PROT 5.8*  BILITOT 1.3*  ALKPHOS 77  ALT 42  AST 50*  GLUCOSE 115*   Lab Results  Component Value Date   CKTOTAL 42 05/30/2012   CKMB 0.6 05/30/2012   TROPONINI 0.05 (HH) 07/12/2016     EKG: 1. SVT rate 198 with diffuse ST depression => suspect atypical atrial flutter vs AT with 1:1 conduction. 2. Atypical flutter versus AT rate 100, 2:1 block 3. NSR, LVH 4. SVT rate around 200 again  Telemetry: Currently, suspect atypical flutter versus AT with 2:1 block  ASSESSMENT AND PLAN: 70 yo with history of mitral valve prolapse/MR s/p MV repair, CVA, typical atrial flutter s/p ablation, atypical atrial flutter/atrial tachycardia presented to ER yesterday with palpitations associated with lightheadedness/presyncope.  1. Atypical atrial flutter versus atrial tachycardia: She was admitted with 1:1 conduction of atypical atrial flutter versus AT.  She has had this in the past, reports DCCV x 3.  Went back to atypical atrial flutter vs AT with rate around 100, 2:1 block, and is maintaining this on amiodarone gtt.   - If she stays in atrial flutter overnight, would plan DCCV tomorrow (she has not missed any Eliquis).  - Would continue amiodarone.  She has had recurrent symptomatic SVT with 1:1 conduction atypical flutter vs AT, think this will need to be suppressed.  As she  has been started on amiodarone, would use this for now.  Can switch to po after DCCV tomorrow.  Will need consultation with Dr. Lovena Le to see if she would be ablation candidate versus continue amiodarone long-term versus switch eventually to a different antiarrhythmic.  I discussed with her the potential side effects of amiodarone.  - Continue apixaban.  - Repeat ECG now to confirm rhythm (looks like the flutter on telemetry).  2. Elevated troponin: Very mild rise in troponin.  I strongly suspect that this was demand ischemia in the setting of SVT with rate around 200 bpm.  - Echo today.  3. S/p MV repair: Will reassess with echo.  4.  Typical atrial flutter: s/p ablation.   Signed: Loralie Champagne 07/12/2016 12:42 PM

## 2016-07-12 NOTE — Progress Notes (Signed)
Patient is  admitted to room 239 with the diagnoses of Afib. Alert and oriented x 4. Denied any acute pain. No respiratory distress noted.  Amiodarone drip running at 16.7 mL/hr with no issue (pt. is tolerating, HR is sustaining at 90s). Skin assessment done with Apolonio Schneiders RN, skin dry and intact. Tele box called to [facility] with Danielle NT as a second verifier. Password was set up and fall risk contract signed. Will continue to monitor.

## 2016-07-12 NOTE — Progress Notes (Signed)
[facility] [place] NAME: Kemara Saks    MR#:  [id number]  DATE OF BIRTH:  September 23, 1945  SUBJECTIVE:  CHIEF COMPLAINT:   Chief Complaint  Patient presents with  . Palpitations   No further palpitations today. No chest pain or shortness of breath.  REVIEW OF SYSTEMS:    Review of Systems  Constitutional: Negative for chills and fever.  HENT: Negative for sore throat.   Eyes: Negative for blurred vision, double vision and pain.  Respiratory: Negative for cough, hemoptysis, shortness of breath and wheezing.   Cardiovascular: Positive for palpitations. Negative for chest pain, orthopnea and leg swelling.  Gastrointestinal: Negative for abdominal pain, constipation, diarrhea, heartburn, nausea and vomiting.  Genitourinary: Negative for dysuria and hematuria.  Musculoskeletal: Negative for back pain and joint pain.  Skin: Negative for rash.  Neurological: Negative for sensory change, speech change, focal weakness and headaches.  Endo/Heme/Allergies: Does not bruise/bleed easily.  Psychiatric/Behavioral: Negative for depression. The patient is not nervous/anxious.     DRUG ALLERGIES:   Allergies  Allergen Reactions  . Sulfa Antibiotics Nausea Only    VITALS:  Blood pressure 122/65, pulse 100, temperature 98.7 F (37.1 C), temperature source Oral, resp. rate 19, height 5\' 9"  (1.753 m), weight 71.3 kg (157 lb 3.2 oz), SpO2 99 %.  PHYSICAL EXAMINATION:   Physical Exam  GENERAL:  70 y.o.-year-old patient lying in the bed with no acute distress.  EYES: Pupils equal, round, reactive to light and accommodation. No scleral icterus. Extraocular muscles intact.  HEENT: Head atraumatic, normocephalic. Oropharynx and nasopharynx clear.  NECK:  Supple, no jugular venous distention. No thyroid enlargement, no tenderness.  LUNGS: Normal breath sounds bilaterally, no wheezing, rales, rhonchi. No use of accessory muscles of respiration.   CARDIOVASCULAR: S1, S2 normal. No murmurs, rubs, or gallops.  ABDOMEN: Soft, nontender, nondistended. Bowel sounds present. No organomegaly or mass.  EXTREMITIES: No cyanosis, clubbing or edema b/l.    NEUROLOGIC: Cranial nerves II through XII are intact. No focal Motor or sensory deficits b/l.   PSYCHIATRIC: The patient is alert and oriented x 3.  SKIN: No obvious rash, lesion, or ulcer.   LABORATORY PANEL:   CBC  Recent Labs Lab 07/12/16 0040  WBC 6.0  HGB 13.4  HCT 39.9  PLT 180   ------------------------------------------------------------------------------------------------------------------ Chemistries   Recent Labs Lab 07/11/16 0824  07/12/16 0040  NA 139  < > 139  K 4.0  < > 3.3*  CL 105  < > 108  CO2 26  < > 26  GLUCOSE 167*  < > 115*  BUN 18  < > 14  CREATININE 0.78  < > 0.53  CALCIUM 9.4  < > 8.7*  MG 2.0  --   --   AST  --   < > 50*  ALT  --   < > 42  ALKPHOS  --   < > 77  BILITOT  --   < > 1.3*  < > = values in this interval not displayed. ------------------------------------------------------------------------------------------------------------------  Cardiac Enzymes  Recent Labs Lab 07/12/16 0702  TROPONINI 0.05*   ------------------------------------------------------------------------------------------------------------------  RADIOLOGY:  No results found.   ASSESSMENT AND PLAN:   * SVT and Atrial flutter Discussed with Dr. Aundra Dubin of cardiology. Continue amiodarone. Possible cardioversion tomorrow. Continue Eliquis. She is on Coreg at home which was continued. Mild troponin elevation due to tachycardia.  * History of CVA On Eliquis    All the records are reviewed  and case discussed with Care Management/Social Workerr. Management plans discussed with the patient, family and they are in agreement.  CODE STATUS: FULL CODE  DVT Prophylaxis: SCDs  TOTAL TIME TAKING CARE OF THIS PATIENT: 35 minutes.   POSSIBLE D/C IN 1-2 DAYS,  DEPENDING ON CLINICAL CONDITION.  Hillary Bow R M.D on 07/12/2016 at 1:12 PM  Between 7am to 6pm - Pager - [phone]  After 6pm go to [email or web address] - password [email or web address] [facility]  Office  [phone]  CC: Primary care physician; Idelle Crouch, MD  Note: This dictation was prepared with Dragon dictation along with smaller phrase technology. Any transcriptional errors that result from this process are unintentional.

## 2016-07-12 NOTE — Progress Notes (Signed)
*  PRELIMINARY RESULTS* Echocardiogram 2D Echocardiogram has been performed.  Lavell Luster Rainah Kirshner 07/12/2016, 8:23 AM

## 2016-07-12 NOTE — Progress Notes (Signed)
Troponin level 0.06. Dr Marcille Blanco notified  But no new order received. Will continue to monitor.

## 2016-07-13 ENCOUNTER — Inpatient Hospital Stay: Payer: PPO | Admitting: Anesthesiology

## 2016-07-13 ENCOUNTER — Encounter: Payer: Self-pay | Admitting: *Deleted

## 2016-07-13 ENCOUNTER — Encounter
Admission: EM | Disposition: A | Discharge status: Home / Self Care | Payer: Self-pay | Source: Home / Self Care | Attending: Internal Medicine

## 2016-07-13 DIAGNOSIS — R55 Syncope and collapse: Secondary | ICD-10-CM | POA: Diagnosis not present

## 2016-07-13 DIAGNOSIS — R42 Dizziness and giddiness: Secondary | ICD-10-CM | POA: Diagnosis not present

## 2016-07-13 DIAGNOSIS — I471 Supraventricular tachycardia: Secondary | ICD-10-CM | POA: Diagnosis not present

## 2016-07-13 DIAGNOSIS — I4891 Unspecified atrial fibrillation: Secondary | ICD-10-CM | POA: Diagnosis not present

## 2016-07-13 HISTORY — PX: ELECTROPHYSIOLOGIC STUDY: SHX172A

## 2016-07-13 LAB — CBC
HCT: 42.1 % (ref 35.0–47.0)
Hemoglobin: 14.2 g/dL (ref 12.0–16.0)
MCH: 28.8 pg (ref 26.0–34.0)
MCHC: 33.8 g/dL (ref 32.0–36.0)
MCV: 85.2 fL (ref 80.0–100.0)
PLATELETS: 186 10*3/uL (ref 150–440)
RBC: 4.94 MIL/uL (ref 3.80–5.20)
RDW: 13.8 % (ref 11.5–14.5)
WBC: 4.9 10*3/uL (ref 3.6–11.0)

## 2016-07-13 LAB — COMPREHENSIVE METABOLIC PANEL
ALBUMIN: 3.6 g/dL (ref 3.5–5.0)
ALK PHOS: 74 U/L (ref 38–126)
ALT: 33 U/L (ref 14–54)
AST: 35 U/L (ref 15–41)
Anion gap: 5 (ref 5–15)
BILIRUBIN TOTAL: 0.9 mg/dL (ref 0.3–1.2)
BUN: 11 mg/dL (ref 6–20)
CALCIUM: 9.2 mg/dL (ref 8.9–10.3)
CO2: 27 mmol/L (ref 22–32)
Chloride: 109 mmol/L (ref 101–111)
Creatinine, Ser: 0.55 mg/dL (ref 0.44–1.00)
GFR calc Af Amer: >60 mL/min (ref >60)
GLUCOSE: 89 mg/dL (ref 65–99)
POTASSIUM: 4.1 mmol/L (ref 3.5–5.1)
Sodium: 141 mmol/L (ref 135–145)
TOTAL PROTEIN: 6.4 g/dL — AB (ref 6.5–8.1)

## 2016-07-13 SURGERY — CARDIOVERSION (CATH LAB)
Anesthesia: General

## 2016-07-13 MED ORDER — LIDOCAINE HCL (CARDIAC) 20 MG/ML IV SOLN
INTRAVENOUS | Status: DC | PRN
  Administered 2016-07-13: 40 mg via INTRATRACHEAL

## 2016-07-13 MED ORDER — SODIUM CHLORIDE 0.9 % IV SOLN: 250.0000 mL | INTRAVENOUS | Status: DC

## 2016-07-13 MED ORDER — SODIUM CHLORIDE 0.9% FLUSH: 3.0000 mL | INTRAVENOUS | Status: DC | PRN

## 2016-07-13 MED ORDER — SODIUM CHLORIDE 0.9% FLUSH
3.0000 mL | Freq: Two times a day (BID) | INTRAVENOUS | Status: DC
  Administered 2016-07-13: 3 mL via INTRAVENOUS

## 2016-07-13 MED ORDER — AMIODARONE HCL 200 MG PO TABS: 200.0000 mg | ORAL_TABLET | Freq: Every day | ORAL | 0 refills | Status: DC

## 2016-07-13 MED ORDER — AMIODARONE HCL 200 MG PO TABS
400.0000 mg | ORAL_TABLET | Freq: Two times a day (BID) | ORAL | Status: DC
  Administered 2016-07-13: 400 mg via ORAL
  Filled 2016-07-13: qty 2

## 2016-07-13 MED ORDER — PROPOFOL 10 MG/ML IV BOLUS
INTRAVENOUS | Status: DC | PRN
  Administered 2016-07-13: 60 mg via INTRAVENOUS

## 2016-07-13 NOTE — Anesthesia Postprocedure Evaluation (Signed)
Anesthesia Post Note  Patient: Colleen Williamson  Procedure(s) Performed: Procedure(s) (LRB): Cardioversion (N/A)  Patient location during evaluation: Other Anesthesia Type: General Level of consciousness: awake and alert Pain management: pain level controlled Vital Signs Assessment: post-procedure vital signs reviewed and stable Respiratory status: spontaneous breathing and respiratory function stable Cardiovascular status: stable Anesthetic complications: no    Last Vitals:  Vitals:   07/13/16 1246 07/13/16 1250  BP: 129/86 128/85  Pulse:  62  Resp:    Temp:  (!) 36 C    Last Pain:  Vitals:   07/13/16 1250  TempSrc: Axillary                 Raahi Korber K

## 2016-07-13 NOTE — CV Procedure (Signed)
Cardioversion note: A standard informed consent was obtained. Timeout was performed. The pads were placed in the anterior posterior fashion. The patient was given propofol by the anesthesia team.  Successful cardioversion was performed with a 50 J. The patient converted to sinus rhythm. Pre-and post EKGs were reviewed. The patient tolerated the procedure with no immediate complications.  Recommendations: Switch Amiodarone to 400 mg po bid for 1 week then 200 mg once daily. Continue other medications.  Follow up as planned with Dr. Lovena Le.  Can likely be discharged home later today.

## 2016-07-13 NOTE — Progress Notes (Signed)
Discharge instructions explained to pt/ verbalized an understanding/ iv and tele removed/ pt remains in NSR/ transported off unit via wheelchair.

## 2016-07-13 NOTE — Care Management (Signed)
Patient on Amiodarone continuous infusion for atrial fib/flutter.  Chronic Eliquis. for DCCV this morning.  At present, No discharge needs identified

## 2016-07-13 NOTE — Transfer of Care (Signed)
Immediate Anesthesia Transfer of Care Note  Patient: Colleen Williamson  Procedure(s) Performed: Procedure(s): Cardioversion (N/A)  Patient Location: PACU  Anesthesia Type:General  Level of Consciousness: patient cooperative and responds to stimulation  Airway & Oxygen Therapy: Patient Spontanous Breathing and Patient connected to nasal cannula oxygen  Post-op Assessment: Report given to RN, Post -op Vital signs reviewed and stable and Patient moving all extremities X 4  Post vital signs: Reviewed and stable  Last Vitals:  Vitals:   07/13/16 1246 07/13/16 1250  BP: 129/86 128/85  Pulse:  62  Resp:    Temp:  (!) 36 C    Last Pain:  Vitals:   07/13/16 1250  TempSrc: Axillary         Complications: No apparent anesthesia complications

## 2016-07-13 NOTE — Discharge Instructions (Signed)
Resume diet and activity as before ° ° °

## 2016-07-13 NOTE — Progress Notes (Signed)
Patient Name: Colleen Williamson Date of Encounter: 07/13/2016  Primary Cardiologist: Johnny Bridge, MD   Hospital Problem List     Principal Problem:   Atypical atrial flutter [facility]) Active Problems:   SVT (supraventricular tachycardia) (HCC)   Near syncope   Elevated troponin    S/P MV Repair   Subjective   Remains in rate controlled aflutter - 100-102.  No chest pain or dyspnea.  Inpatient Medications    . apixaban  5 mg Oral BID  . carvedilol  3.125 mg Oral BID  . cholecalciferol  400 Units Oral Daily  . docusate sodium  100 mg Oral BID  . magnesium oxide  400 mg Oral BID  . sodium chloride flush  3 mL Intravenous Q12H  . sodium chloride flush  3 mL Intravenous Q12H    Vital Signs    Vitals:   07/12/16 1751 07/12/16 1953 07/13/16 0025 07/13/16 0439  BP: 104/63 123/88 113/64 135/83  Pulse: 99 96 100 97  Resp: 16 16 16 16   Temp:  97.8 F (36.6 C) 97.8 F (36.6 C) 97.6 F (36.4 C)  TempSrc:  Oral Oral Oral  SpO2: 95% 96% 95% 94%  Weight:    157 lb (71.2 kg)  Height:        Intake/Output Summary (Last 24 hours) at 07/13/16 0835 Last data filed at 07/13/16 0442  Gross per 24 hour  Intake           639.13 ml  Output             1200 ml  Net          -560.87 ml   Filed Weights   07/11/16 2244 07/12/16 0344 07/13/16 0439  Weight: 154 lb 11.2 oz (70.2 kg) 157 lb 3.2 oz (71.3 kg) 157 lb (71.2 kg)    Physical Exam   GEN: Well nourished, well developed, in no acute distress.  HEENT: Grossly normal.  Neck: Supple, no JVD, carotid bruits, or masses. Cardiac: RRR, tachy, no murmurs, rubs, or gallops. No clubbing, cyanosis, edema.  Radials/DP/PT 2+ and equal bilaterally.  Respiratory:  Respirations regular and unlabored, clear to auscultation bilaterally. GI: Soft, nontender, nondistended, BS + x 4. MS: no deformity or atrophy. Skin: warm and dry, no rash. Neuro:  Strength and sensation are intact. Psych: AAOx3.  Normal affect.  Labs    CBC  Recent Labs  07/11/16 0824  07/12/16 0040 07/13/16 0511  WBC 5.2  < > 6.0 4.9  NEUTROABS 2.0  --   --   --   HGB 16.2*  < > 13.4 14.2  HCT 48.1*  < > 39.9 42.1  MCV 85.1  < > 85.0 85.2  PLT 223  < > 180 186  < > = values in this interval not displayed. Basic Metabolic Panel  Recent Labs  07/11/16 0824  07/12/16 0040 07/13/16 0511  NA 139  < > 139 141  K 4.0  < > 3.3* 4.1  CL 105  < > 108 109  CO2 26  < > 26 27  GLUCOSE 167*  < > 115* 89  BUN 18  < > 14 11  CREATININE 0.78  < > 0.53 0.55  CALCIUM 9.4  < > 8.7* 9.2  MG 2.0  --   --   --   < > = values in this interval not displayed. Liver Function Tests  Recent Labs  07/12/16 0040 07/13/16 0511  AST 50* 35  ALT 42  33  ALKPHOS 77 74  BILITOT 1.3* 0.9  PROT 5.8* 6.4*  ALBUMIN 3.3* 3.6   Cardiac Enzymes  Recent Labs  07/12/16 0040 07/12/16 0702 07/12/16 1301  TROPONINI 0.06* 0.05* 0.03*   Thyroid Function Tests  Recent Labs  07/11/16 0824  TSH 2.255    Telemetry    Atypical atrial fluter, 10-102   ECG    Pending this AM.  Radiology    No results found. 2D Echocardiogram 12.17.2017  Study Conclusions  - Left ventricle: The cavity size was normal. Wall thickness was   normal. Systolic function was normal. The estimated ejection   fraction was in the range of 55% to 60%. Indeterminant diastolic   function (atrial flutter). Wall motion was normal; there were no   regional wall motion abnormalities. - Aortic valve: There was no stenosis. - Mitral valve: Status post mitral valve repair. There was no   evidence for stenosis. There was no significant regurgitation.   Pressure half-time: 97 ms. Mean gradient (D): 4 mm Hg. - Left atrium: The atrium was mildly dilated. - Right ventricle: The cavity size was normal. Systolic function   was normal. - Right atrium: The atrium was mildly dilated. - Pulmonary arteries: No complete TR doppler jet so unable to   estimate PA systolic pressure. - Inferior vena cava:  The vessel was normal in size. The   respirophasic diameter changes were in the normal range (>= 50%),   consistent with normal central venous pressure.  Impressions:  - The patient appeared to be in atrial flutter. Normal LV size with   EF 55-60%. Normal RV size and systolic function. Stable repaired   mitral valve. Patient Profile     70 y/o ? with a h/o MVP s/p MV repair, CVA, typical aflutter s/p rfca, and atypical aflutter/atrial tachycardia, who was admitted 12/17 with SVT (atypical aflutter w/ 1:1 conduction vs atrial tach) and rates ~ 200 and converted to to aflutter @ a rate of 102 following adenosine and valsalva.  Assessment & Plan    1.  Atypical atrial flutter vs atrial tachycardia:  Pt presented to the ED on 12/17 with SVT and rates of ~ 200.  She converted to atrial flutter w/ rates of 100-102 following valsavla and adenosine in the ED.  She has since been on amio and remains in atrial flutter.  ECG pending this AM.  No chest pain or dyspnea.  She is on chronic eliquis and has not missed any doses.  Will plan on DCCV this morning.  Cont IV amio for now, change to PO post-dccv.  Will ultimately need EP f/u with Dr. Lovena Le in [place].  2.  Elevated troponin:  Peak 0.06.  No chest pain.  Suspect demand ischemia in the setting of tachycardia.  Echo showed nl LV fxn - confirmed aflutter.  No further ischemic eval @ this time.  3.  S/P MV repair:  Stable MV fxn on echo.  Signed,  Murray Hodgkins NP 07/13/2016, 8:35 AM

## 2016-07-13 NOTE — Anesthesia Preprocedure Evaluation (Signed)
Anesthesia Evaluation  Patient identified by MRN, date of birth, ID band Patient awake    Reviewed: Allergy & Precautions, NPO status , Patient's Chart, lab work & pertinent test results  History of Anesthesia Complications Negative for: history of anesthetic complications  Airway Mallampati: II       Dental   Pulmonary neg pulmonary ROS, former smoker,           Cardiovascular hypertension, Pt. on medications and Pt. on home beta blockers      Neuro/Psych CVA (mild r sided weakness), Residual Symptoms    GI/Hepatic negative GI ROS, Neg liver ROS,   Endo/Other  negative endocrine ROS  Renal/GU negative Renal ROS     Musculoskeletal   Abdominal   Peds  Hematology negative hematology ROS (+)   Anesthesia Other Findings   Reproductive/Obstetrics                             Anesthesia Physical Anesthesia Plan  ASA: III  Anesthesia Plan: General   Post-op Pain Management:    Induction: Intravenous  Airway Management Planned: Nasal Cannula  Additional Equipment:   Intra-op Plan:   Post-operative Plan:   Informed Consent: I have reviewed the patients History and Physical, chart, labs and discussed the procedure including the risks, benefits and alternatives for the proposed anesthesia with the patient or authorized representative who has indicated his/her understanding and acceptance.     Plan Discussed with:   Anesthesia Plan Comments:         Anesthesia Quick Evaluation

## 2016-07-16 ENCOUNTER — Ambulatory Visit: Payer: Self-pay

## 2016-07-16 ENCOUNTER — Other Ambulatory Visit: Payer: Self-pay

## 2016-07-16 NOTE — Patient Outreach (Signed)
[facility]) Care Management  07/16/2016  Colleen Williamson 20-Apr-1946 [id number]     Transition of Care Referral  Referral Date: 07/16/16 Referral Source: HTA Discharge Report Date of Discharge: 07/13/16 Facility: [place]: HTA   Outreach attempt # 1 to patient. No answer at present. RN CM left HIPAA compliant voicemail message along with contact info.   Plan: RN CM will make outreach attempt to patient within one business day if no return call from patient.   Enzo Montgomery, RN,BSN,CCM [place] Management Telephonic Care Management Coordinator Direct Phone: [phone] Toll Free: [phone] Fax: [phone]

## 2016-07-17 ENCOUNTER — Other Ambulatory Visit: Payer: Self-pay

## 2016-07-17 NOTE — Patient Outreach (Signed)
[facility]) Care Management  07/17/2016  Colleen Williamson 1945-08-18 [id number]  Transition of Care Referral  Referral Date: 07/16/16 Referral Source: HTA Discharge Report Date of Discharge: 07/13/16 Facility: [place]: HTA    Outreach attempt #2 to patient. No answer at present.    Plan: RN CM will make outreach attempt to patient within a week.   Enzo Montgomery, RN,BSN,CCM [place] Management Telephonic Care Management Coordinator Direct Phone: [phone] Toll Free: [phone] Fax: [phone]

## 2016-07-22 ENCOUNTER — Other Ambulatory Visit: Payer: Self-pay

## 2016-07-22 NOTE — Patient Outreach (Signed)
[facility]) Care Management  07/22/2016  Colleen Williamson [birth year] [id number]   Transition of Care Referral  Referral Date: 07/16/16 Referral Source: HTA Discharge Report Date of Discharge: 07/13/16 Facility: [place]: HTA   Outreach attempt #3 to patient. No answer. RN CM left HIPAA compliant voicemail message along with contact info.      Plan: RN CM will send unsuccessful outreach letter to patient and close case if no response from patient within 10 business days.    Enzo Montgomery, RN,BSN,CCM [place] Management Telephonic Care Management Coordinator Direct Phone: [phone] Toll Free: [phone] Fax: [phone]

## 2016-07-23 NOTE — Discharge Summary (Signed)
[place] [facility] [place] NAME: Colleen Williamson    MR#:  [id number]  DATE OF BIRTH:  [birth year]  DATE OF ADMISSION:  07/11/2016 ADMITTING PHYSICIAN: Idelle Crouch, MD  DATE OF DISCHARGE: 07/13/2016  3:48 PM  PRIMARY CARE PHYSICIAN: SPARKS,JEFFREY D, MD   ADMISSION DIAGNOSIS:  SVT (supraventricular tachycardia) (HCC) [I47.1] Atrial fibrillation with RVR ([place]) [I48.91]  DISCHARGE DIAGNOSIS:  Principal Problem:   Atypical atrial flutter (HCC) Active Problems:   SVT (supraventricular tachycardia) (HCC)   Near syncope   Elevated troponin   SECONDARY DIAGNOSIS:   Past Medical History:  Diagnosis Date  . Abnormal mammogram, unspecified 2014  . Breast cyst   . Mitral valve disorder    mitral valve repair  . Stroke ([place]) 07/03/2014  . SVT (supraventricular tachycardia) ([place])      ADMITTING HISTORY  Chief Complaint: palpitations with near syncope  [name] is a 70 y.o. female has a past medical history significant for s/p mitral valve repair, CV, and SVT now with recurrent episodes of palpitations and near syncope with dizziness. In ER, EKG's show both SVT and rapid a-fib. She denies CP or SOB. No fever. No actual syncope. She is now admitted after being started on an Amiodarone drip in the [place]:   * SVT and Atrial flutter Patient had SVT in the 160s in the emergency room. Was given adenosis and initially no significant improvement. Later had vasovagal maneuver done and heart rate improved showing atrial flutter. Case was discussed with Dr. Aundra Dubin of cardiology who suggested starting on amiodarone drip. Patient was admitted to telemetry floor on amiodarone drip. Later had cardioversion done with which she returned to normal sinus rhythm. Changed to amiodarone orally. On day of discharge case was discussed Dr. Fletcher Anon. Has been set up for follow-up at [facility] with EP cardiology.  * History of CVA On  Eliquis  Stable for discharge home.  CONSULTS OBTAINED:  Treatment Team:  Larey Dresser, MD  DRUG ALLERGIES:   Allergies  Allergen Reactions  . Sulfa Antibiotics Nausea Only    DISCHARGE MEDICATIONS:   Discharge Medication List as of 07/13/2016  3:19 PM    START taking these medications   Details  amiodarone (PACERONE) 200 MG tablet Take 1 tablet (200 mg total) by mouth daily. Start with 400mg  Twice daily for 1 week and then 200 mg daily., Starting Mon 07/13/2016, Normal      CONTINUE these medications which have NOT CHANGED   Details  apixaban (ELIQUIS) 5 MG TABS tablet Take 1 tablet (5 mg total) by mouth 2 (two) times daily., Starting 07/09/2014, Until Discontinued, Normal    carvedilol (COREG) 3.125 MG tablet TAKE 1 TABLET (3.125 MG TOTAL) BY MOUTH 2 (TWO) TIMES DAILY., Starting Tue 04/21/2016, Normal    cholecalciferol (VITAMIN D) 400 units TABS tablet Take 400 Units by mouth daily., Until Discontinued, Historical Med    magnesium oxide (MAG-OX) 400 (241.3 MG) MG tablet Take 1 tablet (400 mg total) by mouth 2 (two) times daily., Starting 05/02/2015, Until Discontinued, Print        Today   VITAL SIGNS:  Blood pressure 110/70, pulse (!) 55, temperature 98.3 F (36.8 C), temperature source Oral, resp. rate 18, height 5\' 9"  (1.753 m), weight 71.2 kg (157 lb), SpO2 100 %.  I/O:  No intake or output data in the 24 hours ending 07/23/16 1342  PHYSICAL EXAMINATION:  Physical Exam  GENERAL:  70 y.o.-year-old  patient lying in the bed with no acute distress.  LUNGS: Normal breath sounds bilaterally, no wheezing, rales,rhonchi or crepitation. No use of accessory muscles of respiration.  CARDIOVASCULAR: S1, S2 normal. No murmurs, rubs, or gallops.  ABDOMEN: Soft, non-tender, non-distended. Bowel sounds present. No organomegaly or mass.  NEUROLOGIC: Moves all 4 extremities. PSYCHIATRIC: The patient is alert and oriented x 3.  SKIN: No obvious rash, lesion, or ulcer.    DATA REVIEW:   CBC No results for input(s): WBC, HGB, HCT, PLT in the last 168 hours.  Chemistries  No results for input(s): NA, K, CL, CO2, GLUCOSE, BUN, CREATININE, CALCIUM, MG, AST, ALT, ALKPHOS, BILITOT in the last 168 hours.  Invalid input(s): GFRCGP  Cardiac Enzymes No results for input(s): TROPONINI in the last 168 hours.  Microbiology Results  Results for orders placed or performed during the hospital encounter of 07/03/14  MRSA PCR Screening     Status: None   Collection Time: 07/03/14 11:36 AM  Result Value Ref Range Status   MRSA by PCR NEGATIVE NEGATIVE Final    Comment:        The GeneXpert MRSA Assay (FDA approved for NASAL specimens only), is one component of a comprehensive MRSA colonization surveillance program. It is not intended to diagnose MRSA infection nor to guide or monitor treatment for MRSA infections.     RADIOLOGY:  No results found.  Follow up with PCP in 1 week.  Management plans discussed with the patient, family and they are in agreement.  CODE STATUS:  Code Status History    Date Active Date Inactive Code Status Order ID Comments User Context   07/11/2016 10:36 PM 07/13/2016  6:58 PM Full Code [id number]  Idelle Crouch, MD Inpatient   05/01/2015  5:27 PM 05/02/2015  4:03 PM Full Code [id number]  Henreitta Leber, MD ED   12/27/2014  6:32 PM 12/28/2014  1:13 PM Full Code [id number]  Evans Lance, MD Inpatient   07/03/2014 11:45 AM 07/09/2014  7:28 PM Full Code [id number]  [place] Directive Documentation   Flowsheet Row Most Recent Value  Type of Advance Directive  Living will  Pre-existing out of facility DNR order (yellow form or pink MOST form)  No data  "MOST" Form in Place?  No data      TOTAL TIME TAKING CARE OF THIS PATIENT ON DAY OF DISCHARGE: more than 30 minutes.   Hillary Bow R M.D on 07/23/2016 at 1:42 PM  Between 7am to 6pm - Pager - [phone]  After 6pm go to [email or web address] -  password [email or web address] [place] [facility]  Office  [phone]  CC: Primary care physician; Idelle Crouch, MD  Note: This dictation was prepared with Dragon dictation along with smaller phrase technology. Any transcriptional errors that result from this process are unintentional.

## 2016-07-29 ENCOUNTER — Other Ambulatory Visit: Payer: Self-pay

## 2016-07-29 MED ORDER — APIXABAN 5 MG PO TABS: 5.0000 mg | ORAL_TABLET | Freq: Two times a day (BID) | ORAL | 2 refills | Status: DC

## 2016-07-31 ENCOUNTER — Other Ambulatory Visit: Payer: Self-pay | Admitting: Cardiovascular Disease

## 2016-07-31 ENCOUNTER — Encounter: Payer: Self-pay | Admitting: Cardiovascular Disease

## 2016-07-31 NOTE — Telephone Encounter (Signed)
Please review for refill. Thanks!  

## 2016-07-31 NOTE — Telephone Encounter (Signed)
Rx refill sent to pharmacy. 

## 2016-08-05 ENCOUNTER — Other Ambulatory Visit: Payer: Self-pay

## 2016-08-05 NOTE — Patient Outreach (Signed)
[facility]) Care Management  08/05/2016  Colleen Williamson [birth year] [id number]   Transition of Care Referral  Referral Date: 07/16/16 Referral Source: HTA Discharge Report Date of Discharge: 07/13/16 Facility: [place]: HTA   Multiple attempts to establish contact with patient. No response from letter mailed to patient. Case is being closed at this time.    Plan: RN CM will notify [facility] administrative assistant of case closure status. RN CM will send MD case closure along with reason.   Enzo Montgomery, RN,BSN,CCM [place] Management Telephonic Care Management Coordinator Direct Phone: [phone] Toll Free: [phone] Fax: [phone]

## 2016-08-11 ENCOUNTER — Ambulatory Visit (INDEPENDENT_AMBULATORY_CARE_PROVIDER_SITE_OTHER): Payer: PPO | Admitting: Internal Medicine

## 2016-08-11 ENCOUNTER — Encounter: Payer: Self-pay | Admitting: Internal Medicine

## 2016-08-11 VITALS — BP 128/80 | HR 79 | Ht 68.0 in | Wt 159.8 lb

## 2016-08-11 DIAGNOSIS — I471 Supraventricular tachycardia: Secondary | ICD-10-CM | POA: Diagnosis not present

## 2016-08-11 DIAGNOSIS — I484 Atypical atrial flutter: Secondary | ICD-10-CM

## 2016-08-11 DIAGNOSIS — I480 Paroxysmal atrial fibrillation: Secondary | ICD-10-CM | POA: Diagnosis not present

## 2016-08-11 DIAGNOSIS — I4719 Other supraventricular tachycardia: Secondary | ICD-10-CM

## 2016-08-11 NOTE — Patient Instructions (Addendum)
Medication Instructions:    Your physician recommends that you continue on your current medications as directed. Please refer to the Current Medication list given to you today.  --- If you need a refill on your cardiac medications before your next appointment, please call your pharmacy. ---  Labwork:  Your physician recommends that you return for lab work in: 6 months for Amiodarone surveillance labs:  TSH & LFTs  Testing/Procedures:  None ordered  Follow-Up:  Your physician wants you to follow-up in: 6 months with Dr. Lovena Le.  You will receive a reminder letter in the mail two months in advance. If you don't receive a letter, please call our office to schedule the follow-up appointment.  Thank you for choosing [facility]!!

## 2016-08-11 NOTE — Progress Notes (Signed)
HPI [name] returns today after being admitted for palpitations and having SVT and undergoing DCCV. She is a pleasant 71 yo woman with mitral valve disease who underwent mitral valve repair over 3 years ago. She had post op atrial fib by her report and sustained a stroke. She had recurrent atrial flutter and several DC cardioversions. She underwent catheter ablation of typical atrial flutter. She has felt well in the interim. She developed recurrent atrial arrhythmias, not AV nodal dependent and ultimately was cardioverted and started on amiodarone. She has done well so far with the amiodarone.  Allergies  Allergen Reactions  . Sulfa Antibiotics Nausea Only     Current Outpatient Prescriptions  Medication Sig Dispense Refill  . amiodarone (PACERONE) 200 MG tablet START WITH 2 TABS (400MG ) TWICE DAILY FOR 1 WEEK,THEN TAKE 1 TAB (200MG ) DAILY. 30 tablet 3  . apixaban (ELIQUIS) 5 MG TABS tablet Take 1 tablet (5 mg total) by mouth 2 (two) times daily. 60 tablet 2  . carvedilol (COREG) 3.125 MG tablet TAKE 1 TABLET (3.125 MG TOTAL) BY MOUTH 2 (TWO) TIMES DAILY. 180 tablet 3  . cholecalciferol (VITAMIN D) 400 units TABS tablet Take 400 Units by mouth daily.    . magnesium oxide (MAG-OX) 400 (241.3 MG) MG tablet Take 1 tablet (400 mg total) by mouth 2 (two) times daily. 60 tablet 5   No current facility-administered medications for this visit.      Past Medical History:  Diagnosis Date  . Abnormal mammogram, unspecified 2014  . Breast cyst   . Mitral valve disorder    mitral valve repair  . Stroke ([place]) 07/03/2014  . SVT (supraventricular tachycardia) (HCC)     ROS:   All systems reviewed and negative except as noted in the HPI.   Past Surgical History:  Procedure Laterality Date  . ABDOMINAL HYSTERECTOMY  2004  . BREAST CYST ASPIRATION  15 YRS AGO  . CARDIOVERSION N/A 09/21/2014   Procedure: CARDIOVERSION;  Surgeon: Josue Hector, MD;  Location: [place];  Service:  Cardiovascular;  Laterality: N/A;  . ELECTROPHYSIOLOGIC STUDY N/A 11/28/2014   Procedure: Cardioversion;  Surgeon: Teodoro Spray, MD;  Location: [place] [facility];  Service: Cardiovascular;  Laterality: N/A;  . ELECTROPHYSIOLOGIC STUDY N/A 12/27/2014   Procedure: A-Flutter;  Surgeon: Evans Lance, MD;  Location: [place] [facility];  Service: Cardiovascular;  Laterality: N/A;  . ELECTROPHYSIOLOGIC STUDY N/A 11/28/2014   Procedure: Cardioversion;  Surgeon: Teodoro Spray, MD;  Location: [facility];  Service: Cardiovascular;  Laterality: N/A;  Have discussed with Dr. Marcello Moores  . ELECTROPHYSIOLOGIC STUDY N/A 07/13/2016   Procedure: Cardioversion;  Surgeon: Wellington Hampshire, MD;  Location: [facility];  Service: Cardiovascular;  Laterality: N/A;  . EYE SURGERY  2007  . MITRAL VALVE REPAIR  05/2014  . RADIOLOGY WITH ANESTHESIA N/A 07/03/2014   Procedure: RADIOLOGY WITH ANESTHESIA;  Surgeon: Rob Hickman, MD;  Location: [place];  Service: Radiology;  Laterality: N/A;     Family History  Problem Relation Age of Onset  . Cancer Father   . Cancer      BOTH SIDES OF THE FAMILY  . Diabetes      MATERNAL SIDE OF THE FAMILY  . Breast cancer Maternal Aunt      Social History   Social History  . Marital status: Widowed    Spouse name: N/A  . Number of children: 3  . Years of education: 12+  Occupational History  . Not on file.   Social History Main Topics  . Smoking status: Former Research scientist (life sciences)  . Smokeless tobacco: Never Used     Comment: quit smoking in her 20's  . Alcohol use No  . Drug use: No  . Sexual activity: Not on file   Other Topics Concern  . Not on file   Social History Narrative   Patient is widowed with 3 children.   Patient is right handed.   Patient has hs education plus 1-2 yrs of college.   Patient drinks 1 cup daily.     BP 128/80   Pulse 79   Ht 5\' 8"  (1.727 m)   Wt 159 lb 12.8 oz (72.5 kg)   SpO2 97%   BMI 24.30 kg/m   Physical Exam:  Well appearing 71  yo woman, NAD HEENT: Unremarkable Neck:  6 cm JVD, no thyromegally Back:  No CVA tenderness Lungs:  Clear with no wheezes HEART:  Regular rate rhythm, no murmurs, no rubs, no clicks Abd:  soft, positive bowel sounds, no organomegally, no rebound, no guarding Ext:  2 plus pulses, no edema, no cyanosis, no clubbing Skin:  No rashes no nodules Neuro:  CN II through XII intact, motor grossly intact  EKG - NSR    Assess/Plan: 1. Atypical atrial flutter - she has had recurrent symptoms and it is unclear if her arrhythmias are atrial tachy or atypical atrial flutter. I have discussed the pro's and con's of repeat catheter ablation which would be a left sided procedure. I have recommended she continue amio 200 mg daily for now. Will hope to wean this down when I see her back in 6 months. 2. HTN - her blood pressure is well controlled. Continue low dose coreg. 3. Anti-coagulation - she will continue her Eliquis. She has had no bleeding. 4. Mitral valve repair - she has no MR on exam. Will follow.  Mikle Bosworth.D.

## 2016-11-02 ENCOUNTER — Other Ambulatory Visit: Payer: Self-pay | Admitting: *Deleted

## 2016-11-02 MED ORDER — AMIODARONE HCL 200 MG PO TABS: ORAL_TABLET | ORAL | 3 refills | Status: DC

## 2016-11-13 DIAGNOSIS — R490 Dysphonia: Secondary | ICD-10-CM | POA: Diagnosis not present

## 2016-11-13 DIAGNOSIS — K219 Gastro-esophageal reflux disease without esophagitis: Secondary | ICD-10-CM | POA: Diagnosis not present

## 2016-11-13 DIAGNOSIS — H6121 Impacted cerumen, right ear: Secondary | ICD-10-CM | POA: Diagnosis not present

## 2016-11-18 DIAGNOSIS — Z79899 Other long term (current) drug therapy: Secondary | ICD-10-CM | POA: Diagnosis not present

## 2016-11-18 DIAGNOSIS — Z1329 Encounter for screening for other suspected endocrine disorder: Secondary | ICD-10-CM | POA: Diagnosis not present

## 2016-11-18 DIAGNOSIS — E785 Hyperlipidemia, unspecified: Secondary | ICD-10-CM | POA: Diagnosis not present

## 2016-11-18 DIAGNOSIS — E559 Vitamin D deficiency, unspecified: Secondary | ICD-10-CM | POA: Diagnosis not present

## 2016-11-18 DIAGNOSIS — E538 Deficiency of other specified B group vitamins: Secondary | ICD-10-CM | POA: Diagnosis not present

## 2016-11-20 ENCOUNTER — Ambulatory Visit: Payer: PPO | Admitting: Cardiovascular Disease

## 2016-11-22 NOTE — Progress Notes (Signed)
Cardiology Office Note  Date:  11/24/2016   ID:  PEBBLE BOTKIN, DOB 09/11/1945, MRN [id number]  PCP:  Idelle Crouch, MD   Chief Complaint  Patient presents with  . other    1 yr f/u c/o sob. Meds reviewed verbally with pt.    HPI:  Mrs. Mcgann is a very pleasant 71 year old woman with history of  HTN mitral valve prolapse, repair earlier in 2015 at [facility],  postoperative atrial fibrillation,  stroke,  atrial flutter  underwent DCCV , anti-coagulation with Eliquis,  recurrent atrial flutter while on flecainide  second cardioversion 4/16,  catheter ablation in [place].  On amiodarone, eliquis Cardiac cath 2015 Carotid 1- 39 percent stenosis involving the right internal carotid artery and the left internal carotid artery. EF 55 in 06/2016 She presents for routine follow-up of her atrial flutter  epsiode of atrial fibrillation 07/11/2016, Admission to the hospital Hospital records reviewed with the patient in detail Cardioversion To restore normal sinus rhythm Echocardiogram reviewed showing normal LV function, intact mitral valve  Tolerating amiodarone 200 mg daily Reported having side effects on flecainide in the past, funny taste, hair loss Denies any further episodes of atrial fibrillation  EKG personally reviewed by myself on todays visit Shows normal sinus rhythm rate 60 bpm left axis deviation no significant ST or T-wave changes  PMH:   has a past medical history of Abnormal mammogram, unspecified (2014); Breast cyst; Mitral valve disorder; Stroke ([place]) (07/03/2014); and SVT (supraventricular tachycardia) ([place]).  PSH:    Past Surgical History:  Procedure Laterality Date  . ABDOMINAL HYSTERECTOMY  2004  . BREAST CYST ASPIRATION  15 YRS AGO  . CARDIOVERSION N/A 09/21/2014   Procedure: CARDIOVERSION;  Surgeon: Josue Hector, MD;  Location: [place];  Service: Cardiovascular;  Laterality: N/A;  . ELECTROPHYSIOLOGIC STUDY N/A 11/28/2014   Procedure:  Cardioversion;  Surgeon: Teodoro Spray, MD;  Location: [place] [facility];  Service: Cardiovascular;  Laterality: N/A;  . ELECTROPHYSIOLOGIC STUDY N/A 12/27/2014   Procedure: A-Flutter;  Surgeon: Evans Lance, MD;  Location: [place] [facility];  Service: Cardiovascular;  Laterality: N/A;  . ELECTROPHYSIOLOGIC STUDY N/A 11/28/2014   Procedure: Cardioversion;  Surgeon: Teodoro Spray, MD;  Location: [facility];  Service: Cardiovascular;  Laterality: N/A;  Have discussed with Dr. Marcello Moores  . ELECTROPHYSIOLOGIC STUDY N/A 07/13/2016   Procedure: Cardioversion;  Surgeon: Wellington Hampshire, MD;  Location: [facility];  Service: Cardiovascular;  Laterality: N/A;  . EYE SURGERY  2007  . MITRAL VALVE REPAIR  05/2014  . RADIOLOGY WITH ANESTHESIA N/A 07/03/2014   Procedure: RADIOLOGY WITH ANESTHESIA;  Surgeon: Rob Hickman, MD;  Location: [place];  Service: Radiology;  Laterality: N/A;    Current Outpatient Prescriptions  Medication Sig Dispense Refill  . amiodarone (PACERONE) 200 MG tablet Take 1 tablet by mouth daily. 30 tablet 3  . apixaban (ELIQUIS) 5 MG TABS tablet Take 1 tablet (5 mg total) by mouth 2 (two) times daily. 60 tablet 2  . carvedilol (COREG) 3.125 MG tablet TAKE 1 TABLET (3.125 MG TOTAL) BY MOUTH 2 (TWO) TIMES DAILY. 180 tablet 3  . cholecalciferol (VITAMIN D) 400 units TABS tablet Take 400 Units by mouth daily.    . magnesium oxide (MAG-OX) 400 (241.3 MG) MG tablet Take 1 tablet (400 mg total) by mouth 2 (two) times daily. 60 tablet 5   No current facility-administered medications for this visit.      Allergies:   Sulfa antibiotics  Social History:  The patient  reports that she has quit smoking. She has never used smokeless tobacco. She reports that she does not drink alcohol or use drugs.   Family History:   family history includes Breast cancer in her maternal aunt; Cancer in her father.    Review of Systems: Review of Systems  Constitutional: Negative.   Respiratory:  Negative.   Cardiovascular: Negative.   Gastrointestinal: Negative.   Musculoskeletal: Negative.   Neurological: Negative.   Psychiatric/Behavioral: Negative.   All other systems reviewed and are negative.    PHYSICAL EXAM: VS:  BP 114/78 (BP Location: Left Arm, Patient Position: Sitting, Cuff Size: Normal)   Pulse 60   Ht 5\' 8"  (1.727 m)   Wt 160 lb (72.6 kg)   BMI 24.33 kg/m  , BMI Body mass index is 24.33 kg/m. GEN: Well nourished, well developed, in no acute distress  HEENT: normal  Neck: no JVD, carotid bruits, or masses Cardiac: RRR; no murmurs, rubs, or gallops,no edema  Respiratory:  clear to auscultation bilaterally, normal work of breathing GI: soft, nontender, nondistended, + BS MS: no deformity or atrophy  Skin: warm and dry, no rash Neuro:  Strength and sensation are intact Psych: euthymic mood, full affect    Recent Labs: 07/11/2016: Magnesium 2.0; TSH 2.255 07/13/2016: ALT 33; BUN 11; Creatinine, Ser 0.55; Hemoglobin 14.2; Platelets 186; Potassium 4.1; Sodium 141    Lipid Panel Lab Results  Component Value Date   CHOL 132 07/04/2014   HDL 46 07/04/2014   LDLCALC 55 07/04/2014   TRIG 153 (H) 07/04/2014      Wt Readings from Last 3 Encounters:  11/24/16 160 lb (72.6 kg)  08/11/16 159 lb 12.8 oz (72.5 kg)  07/13/16 157 lb (71.2 kg)       ASSESSMENT AND PLAN:  Atypical atrial flutter ([place]) - Plan: EKG 12-Lead Currently on low-dose amiodarone tolerating this well without any recurrent symptoms Discussed a plan if she has recurrent arrhythmia Would take extra amiodarone, Possibly even extra carvedilol if blood pressure would allow She has periodic follow-up with Dr. Lovena Le  Essential hypertension - Plan: EKG 12-Lead Most of her blood pressure medications have been weaned off given prior history of orthostasis  Paroxysmal atrial fibrillation ([place]) - Plan: EKG 12-Lead Plan as above for atrial flutter  Cerebrovascular accident (CVA) due to  embolism of precerebral artery ([place]) - Plan: EKG 12-Lead Tolerating anticoagulation, eliquis 5 mg twice a day  S/P MVR (mitral valve repair) - Plan: EKG 12-Lead Recent echocardiogram results reviewed with her showing well-functioning mitral valve   Total encounter time more than 25 minutes  Greater than 50% was spent in counseling and coordination of care with the patient   Disposition:   F/U  12 months   Orders Placed This Encounter  Procedures  . EKG 12-Lead     Signed, Esmond Plants, M.D., Ph.D. 11/24/2016  [place], [place]

## 2016-11-24 ENCOUNTER — Encounter: Payer: Self-pay | Admitting: Cardiovascular Disease

## 2016-11-24 ENCOUNTER — Ambulatory Visit (INDEPENDENT_AMBULATORY_CARE_PROVIDER_SITE_OTHER): Payer: PPO | Admitting: Cardiovascular Disease

## 2016-11-24 VITALS — BP 114/78 | HR 60 | Ht 68.0 in | Wt 160.0 lb

## 2016-11-24 DIAGNOSIS — I6310 Cerebral infarction due to embolism of unspecified precerebral artery: Secondary | ICD-10-CM | POA: Diagnosis not present

## 2016-11-24 DIAGNOSIS — I484 Atypical atrial flutter: Secondary | ICD-10-CM | POA: Diagnosis not present

## 2016-11-24 DIAGNOSIS — Z9889 Other specified postprocedural states: Secondary | ICD-10-CM

## 2016-11-24 DIAGNOSIS — I10 Essential (primary) hypertension: Secondary | ICD-10-CM

## 2016-11-24 DIAGNOSIS — R42 Dizziness and giddiness: Secondary | ICD-10-CM | POA: Diagnosis not present

## 2016-11-24 DIAGNOSIS — I480 Paroxysmal atrial fibrillation: Secondary | ICD-10-CM | POA: Diagnosis not present

## 2016-11-24 NOTE — Patient Instructions (Signed)

## 2016-11-25 DIAGNOSIS — F419 Anxiety disorder, unspecified: Secondary | ICD-10-CM | POA: Diagnosis not present

## 2016-11-25 DIAGNOSIS — I10 Essential (primary) hypertension: Secondary | ICD-10-CM | POA: Diagnosis not present

## 2016-11-25 DIAGNOSIS — Z13220 Encounter for screening for lipoid disorders: Secondary | ICD-10-CM | POA: Diagnosis not present

## 2016-11-25 DIAGNOSIS — Z79899 Other long term (current) drug therapy: Secondary | ICD-10-CM | POA: Diagnosis not present

## 2016-11-25 DIAGNOSIS — I4892 Unspecified atrial flutter: Secondary | ICD-10-CM | POA: Diagnosis not present

## 2017-01-26 DIAGNOSIS — H16223 Keratoconjunctivitis sicca, not specified as Sjogren's, bilateral: Secondary | ICD-10-CM | POA: Diagnosis not present

## 2017-01-26 DIAGNOSIS — Z961 Presence of intraocular lens: Secondary | ICD-10-CM | POA: Diagnosis not present

## 2017-01-26 IMAGING — MG MM DIGITAL SCREENING BILAT W/ CAD
4 series · 4 of 4 positions shown · non-contrast
Comparison: Previous exam(s).

CLINICAL DATA: Screening.

EXAM:
DIGITAL SCREENING BILATERAL MAMMOGRAM WITH CAD

[R CC]
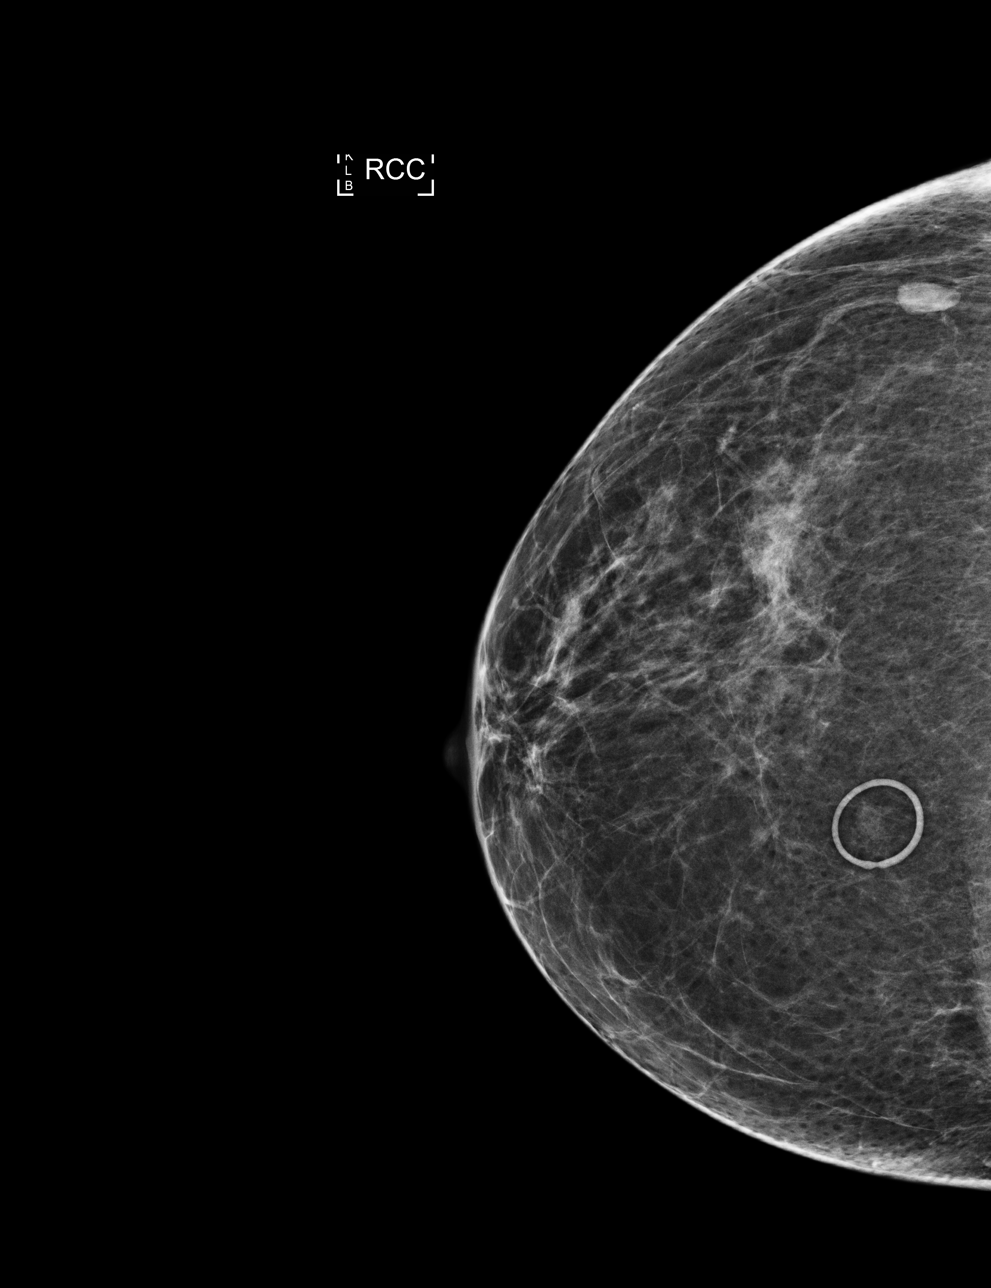

[L MLO]
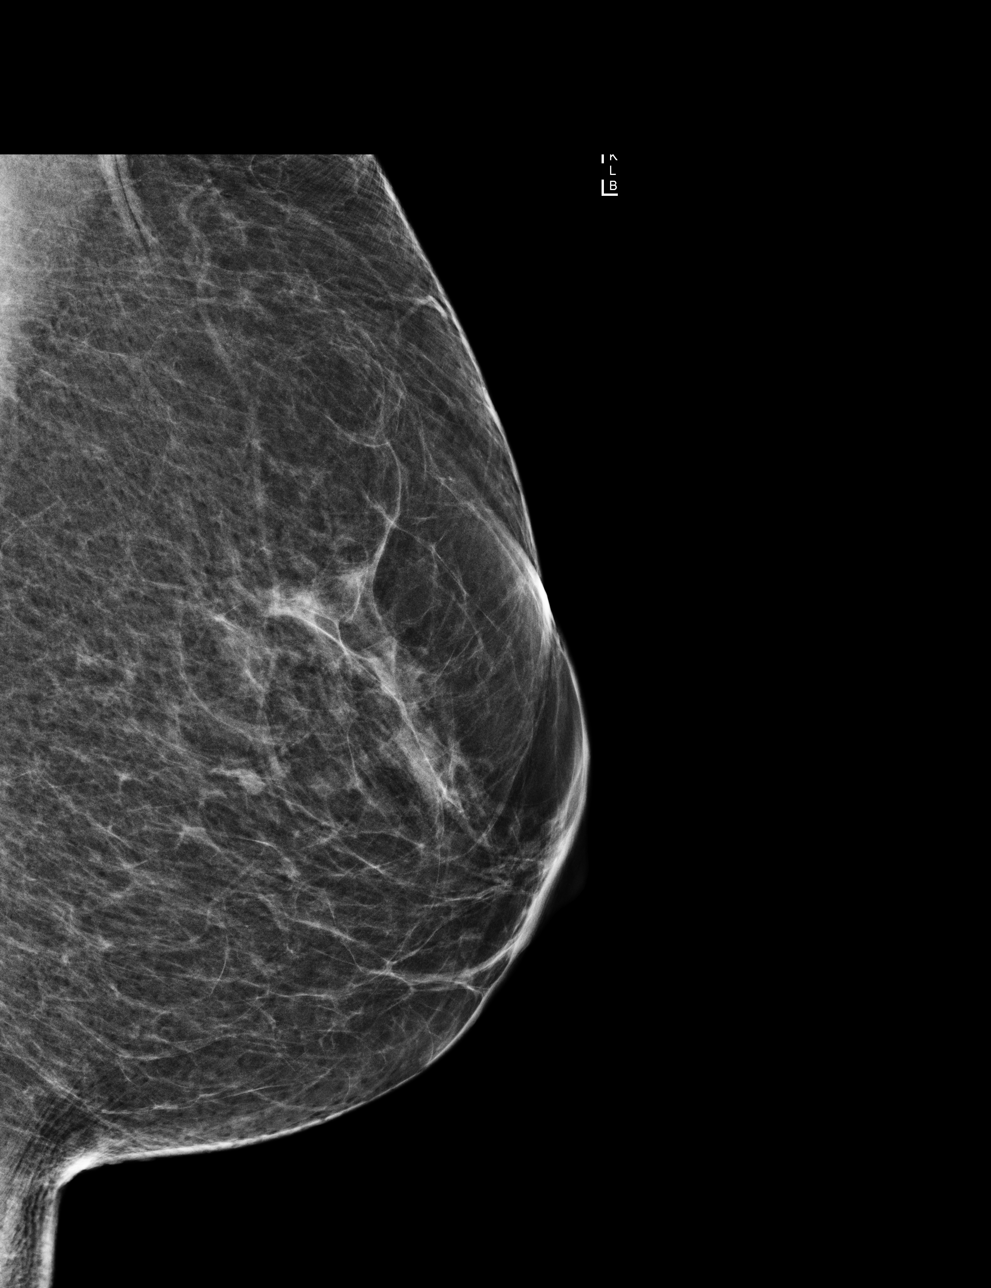

[L CC]
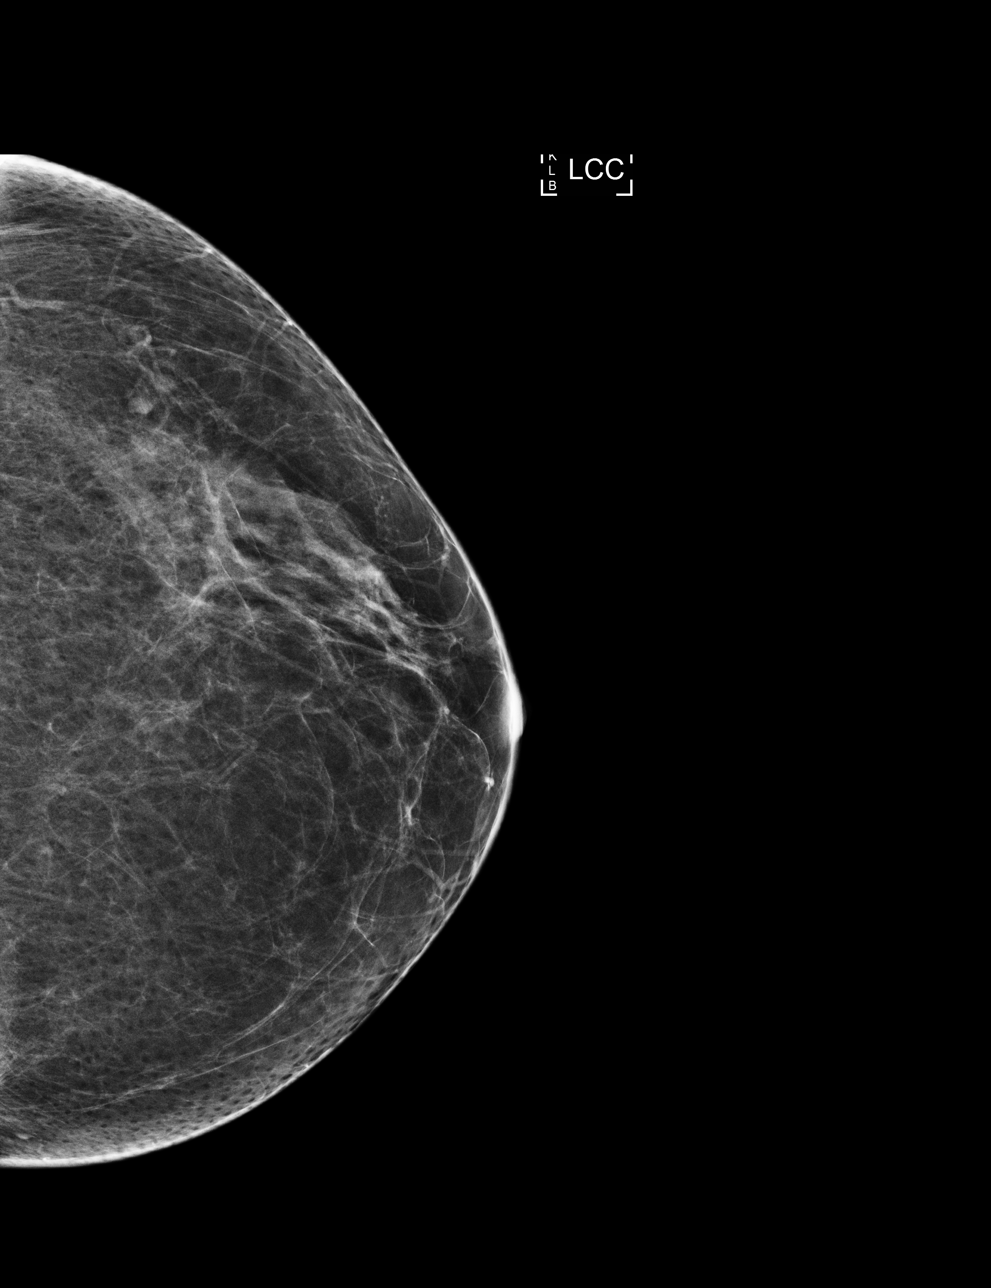

[R MLO]
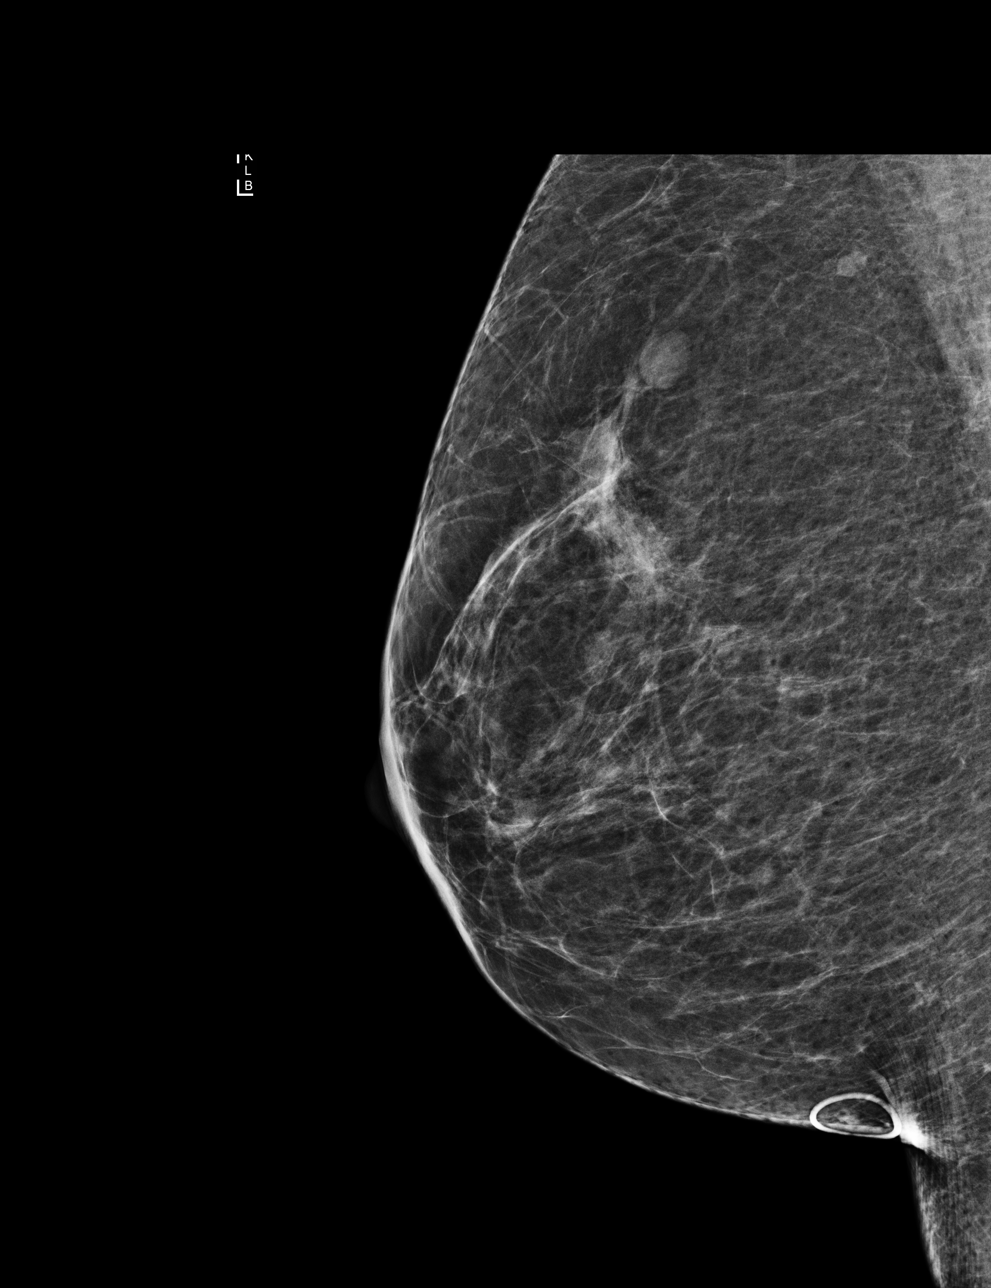

[4 of 4 positions shown; findings below may reference images not displayed]

ACR Breast Density Category b: There are scattered areas of
fibroglandular density.
FINDINGS: There are no findings suspicious for malignancy. Images were
processed with CAD.
IMPRESSION: No mammographic evidence of malignancy. A result letter of this
screening mammogram will be mailed directly to the patient.

RECOMMENDATION:
Screening mammogram in one year. (Code:AS-G-LCT)

BI-RADS CATEGORY  1: Negative.

## 2017-03-04 ENCOUNTER — Telehealth: Payer: Self-pay | Admitting: Cardiovascular Disease

## 2017-03-04 NOTE — Telephone Encounter (Signed)
Patient calling the office for samples of medication:   1.  What medication and dosage are you requesting samples for?  Eliquis 5 mg po BID   2.  Are you currently out of this medication? 1 week left    Patient given information on contacting the med management clinic

## 2017-03-04 NOTE — Telephone Encounter (Signed)
LMOV letting patient know samples placed up front for pick up.   4 boxes of Eliquis 5MG   LOT#: [id number] EXP: 06/2019

## 2017-03-21 ENCOUNTER — Other Ambulatory Visit: Payer: Self-pay | Admitting: Cardiovascular Disease

## 2017-03-29 ENCOUNTER — Other Ambulatory Visit: Payer: Self-pay | Admitting: Cardiovascular Disease

## 2017-04-19 ENCOUNTER — Ambulatory Visit (INDEPENDENT_AMBULATORY_CARE_PROVIDER_SITE_OTHER): Payer: PPO | Admitting: Cardiovascular Disease

## 2017-04-19 ENCOUNTER — Telehealth: Payer: Self-pay | Admitting: Cardiovascular Disease

## 2017-04-19 ENCOUNTER — Encounter: Payer: Self-pay | Admitting: Cardiovascular Disease

## 2017-04-19 VITALS — BP 120/90 | HR 50 | Ht 69.0 in | Wt 155.5 lb

## 2017-04-19 DIAGNOSIS — I483 Typical atrial flutter: Secondary | ICD-10-CM

## 2017-04-19 MED ORDER — APIXABAN 5 MG PO TABS: 5.0000 mg | ORAL_TABLET | Freq: Two times a day (BID) | ORAL | 11 refills | Status: DC

## 2017-04-19 NOTE — Telephone Encounter (Signed)
Pt [place], and started losing her balance. She states she started to fall, but someone caught her. States she did not have any Chest Pain, states she wasn't "breathing deep enough" and felt dizzy. States she went to the fire station and they took her BP it was 150/70. Please call.

## 2017-04-19 NOTE — Telephone Encounter (Signed)
S/w patient. She had episode over the weekend while [place] the mountains where she got off balance and almost fell. She is feeling somewhat dizzy and "off centered" and "going to the left" since then. She thinks she's not taking deep enough breaths. Patient feels like there is a "vice on my head." Patient is leaving Wednesday to go back out of town and wants to make sure she is ok. Patient scheduled to see Dr. Rockey Situ this afternoon.

## 2017-04-19 NOTE — Patient Instructions (Signed)
Medication Instructions:   Please the coreg down to one a day Skip the Pm dose  Labwork:  No new labs needed  Testing/Procedures:  No further testing at this time   Follow-Up: It was a pleasure seeing you in the office today. Please call us if you have new issues that need to be addressed before your next appt.  [phone]  Your physician wants you to follow-up in: 12 months.  You will receive a reminder letter in the mail two months in advance. If you don't receive a letter, please call our office to schedule the follow-up appointment.  If you need a refill on your cardiac medications before your next appointment, please call your pharmacy.

## 2017-04-19 NOTE — Progress Notes (Signed)
Cardiology Office Note  Date:  04/19/2017   ID:  Marquia, Costello Nov 10, 1945, MRN [id number]  PCP:  Idelle Crouch, MD   Chief Complaint  Patient presents with  . other    Pt. c/o feeling off balance & dizzy. Meds reviewed by the pt. verbally.     HPI:  Mrs. Shanley is a very pleasant 71 year old woman with history of  HTN mitral valve prolapse, repair earlier in 2015 at [facility],  postoperative atrial fibrillation,  stroke,  atrial flutter  underwent DCCV , anti-coagulation with Eliquis,  recurrent atrial flutter while on flecainide  second cardioversion 4/16,  catheter ablation in [place].  On amiodarone, eliquis Cardiac cath 2015 Carotid 1- 39 percent stenosis involving the RICA and the left internal carotid artery. EF 55 in 06/2016 She presents for routine follow-up of her atrial flutter  In follow-up today she reports that she was recently in the [place] hiking Initially had trouble climbing up some steps, felt lightheaded Caught to the top of the landing with more trouble, felt dizzy At to sit down, evaluated by a woman who is a paramedic there, heart rate was very low she was told Some orthostasis type symptoms Came in today to get checked out as she is leaving town again  EKG reviewed with her, heart rate 50 bpm sinus bradycardia no significant ST or T-wave changes   orthostatics done on today's visit showingIncrease in heart rate from 51-60 bpm with standing, slight decline in pressure 128 down to 119 with standing even after 3 minutes, no recovery She reports that she has been drinking fluids, denies any recent illness, denies sinus problems  Feels like her head is tight like it is in a vice  Other past medical history reviewed epsiode of atrial fibrillation 07/11/2016, Admission to the hospital Cardioversion To restore normal sinus rhythm Echocardiogram reviewed showing normal LV function, intact mitral valve  Tolerating amiodarone 200 mg  daily Reported having side effects on flecainide in the past, funny taste, hair loss Denies any further episodes of atrial fibrillation   PMH:   has a past medical history of Abnormal mammogram, unspecified (2014); Breast cyst; Mitral valve disorder; Stroke ([place]) (07/03/2014); and SVT (supraventricular tachycardia) ([place]).  PSH:    Past Surgical History:  Procedure Laterality Date  . ABDOMINAL HYSTERECTOMY  2004  . BREAST CYST ASPIRATION  15 YRS AGO  . CARDIOVERSION N/A 09/21/2014   Procedure: CARDIOVERSION;  Surgeon: Josue Hector, MD;  Location: [place];  Service: Cardiovascular;  Laterality: N/A;  . ELECTROPHYSIOLOGIC STUDY N/A 11/28/2014   Procedure: Cardioversion;  Surgeon: Teodoro Spray, MD;  Location: [place] [facility];  Service: Cardiovascular;  Laterality: N/A;  . ELECTROPHYSIOLOGIC STUDY N/A 12/27/2014   Procedure: A-Flutter;  Surgeon: Evans Lance, MD;  Location: [place] [facility];  Service: Cardiovascular;  Laterality: N/A;  . ELECTROPHYSIOLOGIC STUDY N/A 11/28/2014   Procedure: Cardioversion;  Surgeon: Teodoro Spray, MD;  Location: [facility];  Service: Cardiovascular;  Laterality: N/A;  Have discussed with Dr. Marcello Moores  . ELECTROPHYSIOLOGIC STUDY N/A 07/13/2016   Procedure: Cardioversion;  Surgeon: Wellington Hampshire, MD;  Location: [facility];  Service: Cardiovascular;  Laterality: N/A;  . EYE SURGERY  2007  . MITRAL VALVE REPAIR  05/2014  . RADIOLOGY WITH ANESTHESIA N/A 07/03/2014   Procedure: RADIOLOGY WITH ANESTHESIA;  Surgeon: Rob Hickman, MD;  Location: [place];  Service: Radiology;  Laterality: N/A;    Current Outpatient Prescriptions  Medication Sig Dispense  Refill  . amiodarone (PACERONE) 200 MG tablet TAKE 1 TABLET BY MOUTH DAILY. 30 tablet 3  . apixaban (ELIQUIS) 5 MG TABS tablet Take 1 tablet (5 mg total) by mouth 2 (two) times daily. 60 tablet 11  . carvedilol (COREG) 3.125 MG tablet TAKE 1 TABLET (3.125 MG TOTAL) BY MOUTH 2 (TWO) TIMES DAILY. 180 tablet  3  . cholecalciferol (VITAMIN D) 400 units TABS tablet Take 400 Units by mouth daily.    . magnesium oxide (MAG-OX) 400 (241.3 MG) MG tablet Take 1 tablet (400 mg total) by mouth 2 (two) times daily. 60 tablet 5   No current facility-administered medications for this visit.      Allergies:   Sulfa antibiotics   Social History:  The patient  reports that she has quit smoking. She has never used smokeless tobacco. She reports that she does not drink alcohol or use drugs.   Family History:   family history includes Breast cancer in her maternal aunt; Cancer in her father and unknown relative; Diabetes in her unknown relative.    Review of Systems: Review of Systems  Constitutional: Negative.   Respiratory: Negative.   Cardiovascular: Negative.   Gastrointestinal: Negative.   Musculoskeletal: Negative.   Neurological: Negative.   Psychiatric/Behavioral: Negative.   All other systems reviewed and are negative.    PHYSICAL EXAM: VS:  BP 120/90 (BP Location: Left Arm, Patient Position: Sitting, Cuff Size: Normal)   Pulse (!) 50   Ht 5\' 9"  (1.753 m)   Wt 155 lb 8 oz (70.5 kg)   BMI 22.96 kg/m  , BMI Body mass index is 22.96 kg/m.  GEN: Well nourished, well developed, in no acute distress  HEENT: normal  Neck: no JVD, carotid bruits, or masses Cardiac: regular rhythm, bradycardic, no murmurs, rubs, or gallops,no edema  Respiratory:  clear to auscultation bilaterally, normal work of breathing GI: soft, nontender, nondistended, + BS MS: no deformity or atrophy  Skin: warm and dry, no rash Neuro:  Strength and sensation are intact Psych: euthymic mood, full affect    Recent Labs: 07/11/2016: Magnesium 2.0; TSH 2.255 07/13/2016: ALT 33; BUN 11; Creatinine, Ser 0.55; Hemoglobin 14.2; Platelets 186; Potassium 4.1; Sodium 141    Lipid Panel Lab Results  Component Value Date   CHOL 132 07/04/2014   HDL 46 07/04/2014   LDLCALC 55 07/04/2014   TRIG 153 (H) 07/04/2014       Wt Readings from Last 3 Encounters:  04/19/17 155 lb 8 oz (70.5 kg)  11/24/16 160 lb (72.6 kg)  08/11/16 159 lb 12.8 oz (72.5 kg)       ASSESSMENT AND PLAN:  Atypical atrial flutter (HCC) - Plan: EKG 12-Lead Maintaining normal sinus rhythm, now with bradycardia She reports that she feels symptomatic with dizziness Unable to exclude sinus problems Recommended she decrease carvedilol down to 3.125 mg daily Stay on amiodarone and call our office if she continues to feel poorly  Essential hypertension - Plan: EKG 12-Lead Prior history of orthostasis Has recurrent symptoms of dizziness, gait instability Recommended she decrease Coreg down to 3.125 mg daily Weight is down 5 pounds  Paroxysmal atrial fibrillation ([place]) - Plan: EKG 12-Lead Plan as above for atrial flutter  Cerebrovascular accident (CVA) due to embolism of precerebral artery ([place]) - Plan: EKG 12-Lead Tolerating anticoagulation, eliquis 5 mg twice a day Samples provided today, she is in donut hole  S/P MVR (mitral valve repair) - Plan: EKG 12-Lead Recent echocardiogram results reviewed with her showing  well-functioning mitral valve   Total encounter time more than 25 minutes  Greater than 50% was spent in counseling and coordination of care with the patient   Disposition:   F/U  12 months   Orders Placed This Encounter  Procedures  . EKG 12-Lead     Signed, Esmond Plants, M.D., Ph.D. 04/19/2017  [place], [place]

## 2017-05-17 ENCOUNTER — Other Ambulatory Visit: Payer: Self-pay

## 2017-05-17 MED ORDER — APIXABAN 5 MG PO TABS: 5.0000 mg | ORAL_TABLET | Freq: Two times a day (BID) | ORAL | 0 refills | Status: DC

## 2017-05-17 NOTE — Telephone Encounter (Signed)
Pt's last BMET in 2017 - need updated labs for Eliquis dosing

## 2017-06-24 DIAGNOSIS — I639 Cerebral infarction, unspecified: Secondary | ICD-10-CM | POA: Diagnosis not present

## 2017-06-24 DIAGNOSIS — I10 Essential (primary) hypertension: Secondary | ICD-10-CM | POA: Diagnosis not present

## 2017-06-24 DIAGNOSIS — Z79899 Other long term (current) drug therapy: Secondary | ICD-10-CM | POA: Diagnosis not present

## 2017-06-29 ENCOUNTER — Telehealth: Payer: Self-pay | Admitting: Cardiovascular Disease

## 2017-06-29 NOTE — Telephone Encounter (Signed)
Patient calling the office for samples of medication: ° ° °1.  What medication and dosage are you requesting samples for? Eliquis ° °2.  Are you currently out of this medication?  ° ° °

## 2017-06-29 NOTE — Telephone Encounter (Signed)
Called patient. She is in the donut hole and is in need of Eliquis until the end of the year. Advised patient that we cannot continually give out samples. She was understanding. However, samples available at this time and will be provided. Samples left at front desk.   Drug name: Eliquis       Strength: 5 mb        Qty: 3 boxes  LOT: [id number]  Exp.Date:March 2021

## 2017-07-02 DIAGNOSIS — I4892 Unspecified atrial flutter: Secondary | ICD-10-CM | POA: Diagnosis not present

## 2017-07-02 DIAGNOSIS — Z13220 Encounter for screening for lipoid disorders: Secondary | ICD-10-CM | POA: Diagnosis not present

## 2017-07-02 DIAGNOSIS — Z1329 Encounter for screening for other suspected endocrine disorder: Secondary | ICD-10-CM | POA: Diagnosis not present

## 2017-07-02 DIAGNOSIS — Z79899 Other long term (current) drug therapy: Secondary | ICD-10-CM | POA: Diagnosis not present

## 2017-07-02 DIAGNOSIS — Z0000 Encounter for general adult medical examination without abnormal findings: Secondary | ICD-10-CM | POA: Diagnosis not present

## 2017-07-02 DIAGNOSIS — Z1231 Encounter for screening mammogram for malignant neoplasm of breast: Secondary | ICD-10-CM | POA: Diagnosis not present

## 2017-07-02 DIAGNOSIS — Z23 Encounter for immunization: Secondary | ICD-10-CM | POA: Diagnosis not present

## 2017-07-02 DIAGNOSIS — I10 Essential (primary) hypertension: Secondary | ICD-10-CM | POA: Diagnosis not present

## 2017-07-02 DIAGNOSIS — F419 Anxiety disorder, unspecified: Secondary | ICD-10-CM | POA: Diagnosis not present

## 2017-07-13 ENCOUNTER — Other Ambulatory Visit: Payer: Self-pay | Admitting: Cardiovascular Disease

## 2017-09-29 ENCOUNTER — Telehealth: Payer: Self-pay | Admitting: Cardiovascular Disease

## 2017-09-29 NOTE — Telephone Encounter (Signed)
Patient complains of pressure in her head yesterday evening. She was at a ball game in what she describes as a hot gym. She had to go up some stairs after the game and got lightheaded and off balance. Patient says yesterday she only had a cup of coffee to drink. Her daughter got her a cup of water last night and she drank it. Denies feeling like she has a cold or congestion. This morning, the pressure in her head has lessened. BP 123/73, HR 53. Denies chest pain, shortness of breath, palpitations, or dizziness this morning. She does not feel like she is in aFib. Advised patient that staying hydrated by drinking plenty of water is important. She admits she does not drink enough. She will work on getting rehydrated and if she continues to have the pressure in her head or any other symptoms to call us or her PCP.

## 2017-09-29 NOTE — Telephone Encounter (Signed)
Pt states she had a lot of pressure in her head yesterday, states she went to a ball game last night and felt light headed. States she does have the head pressure not very often, but more than once. Please call.

## 2017-10-12 ENCOUNTER — Other Ambulatory Visit: Payer: Self-pay | Admitting: Cardiovascular Disease

## 2017-12-24 DIAGNOSIS — Z1329 Encounter for screening for other suspected endocrine disorder: Secondary | ICD-10-CM | POA: Diagnosis not present

## 2017-12-24 DIAGNOSIS — Z13220 Encounter for screening for lipoid disorders: Secondary | ICD-10-CM | POA: Diagnosis not present

## 2017-12-24 DIAGNOSIS — Z79899 Other long term (current) drug therapy: Secondary | ICD-10-CM | POA: Diagnosis not present

## 2017-12-24 DIAGNOSIS — I10 Essential (primary) hypertension: Secondary | ICD-10-CM | POA: Diagnosis not present

## 2017-12-31 DIAGNOSIS — Z1211 Encounter for screening for malignant neoplasm of colon: Secondary | ICD-10-CM | POA: Diagnosis not present

## 2017-12-31 DIAGNOSIS — I480 Paroxysmal atrial fibrillation: Secondary | ICD-10-CM | POA: Diagnosis not present

## 2017-12-31 DIAGNOSIS — Z1231 Encounter for screening mammogram for malignant neoplasm of breast: Secondary | ICD-10-CM | POA: Diagnosis not present

## 2017-12-31 DIAGNOSIS — E7800 Pure hypercholesterolemia, unspecified: Secondary | ICD-10-CM | POA: Insufficient documentation

## 2017-12-31 DIAGNOSIS — I10 Essential (primary) hypertension: Secondary | ICD-10-CM | POA: Diagnosis not present

## 2017-12-31 DIAGNOSIS — Z79899 Other long term (current) drug therapy: Secondary | ICD-10-CM | POA: Diagnosis not present

## 2018-01-03 ENCOUNTER — Other Ambulatory Visit: Payer: Self-pay | Admitting: Cardiovascular Disease

## 2018-03-01 ENCOUNTER — Telehealth: Payer: Self-pay | Admitting: Cardiovascular Disease

## 2018-03-01 NOTE — Telephone Encounter (Signed)
°  Pt c/o swelling: STAT is pt has developed SOB within 24 hours  1) How much weight have you gained and in what time span?    2) If swelling, where is the swelling located? Just ankles   3) Are you currently taking a fluid pill? No   4) Are you currently SOB? No   5) Do you have a log of your daily weights (if so, list)? No   6) Have you gained 3 pounds in a day or 5 pounds in a week? No   7) Have you traveled recently? No   Has been going on for a while now, it is now black and blue. She states she's not hit anything  PCP (Dr Doy Hutching) told her it was just a hit vein but she thinks it is more  Would like advise on this Please call back

## 2018-03-01 NOTE — Telephone Encounter (Signed)
Returned the call to the patient. She stated that for the last several months she has been having some swelling on her ankles and black/blue areas. She is otherwise asymptomatic.She stated that the black and blue spots have been there but the swelling is new. She stated that the swelling is better in the mornings and increases throughout the day. She stated that she does eat a lot of salt. She was asked to push down on the swelling to see if an indention was there and there was not. She saw her PCP and he stated that it was varicose veins.  She has been advised to elevate her legs during the day and reduce her sodium intake. She will call back if the swelling is not better. She has a follow up with Dr. Rockey Situ on 04/19/18.

## 2018-04-01 DIAGNOSIS — F3341 Major depressive disorder, recurrent, in partial remission: Secondary | ICD-10-CM | POA: Diagnosis not present

## 2018-04-01 DIAGNOSIS — F419 Anxiety disorder, unspecified: Secondary | ICD-10-CM | POA: Diagnosis not present

## 2018-04-05 ENCOUNTER — Other Ambulatory Visit: Payer: Self-pay | Admitting: Internal Medicine

## 2018-04-05 DIAGNOSIS — Z1231 Encounter for screening mammogram for malignant neoplasm of breast: Secondary | ICD-10-CM

## 2018-04-17 NOTE — Progress Notes (Signed)
Cardiology Office Note  Date:  04/19/2018   ID:  Colleen Williamson, DOB 03/29/1946, MRN [id number]  PCP:  Idelle Crouch, MD   Chief Complaint  Patient presents with  . OTHER    12 month f/u c/o bilateral leg dark spots that "won't go away". Meds reviewed verbally with pt.    HPI:  Mrs. Mowers is a very pleasant 72 year old woman with history of  HTN mitral valve prolapse, repair earlier in 2015 at [facility],  postoperative atrial fibrillation,  stroke,  atrial flutter  underwent DCCV , anti-coagulation with Eliquis,  recurrent atrial flutter while on flecainide  second cardioversion 4/16,  catheter ablation in [place].  On amiodarone, eliquis Cardiac cath 2015 Carotid 1- 39 percent stenosis involving the RICA and the left internal carotid artery. EF 55 in 06/2016 Chronic SOB She presents for routine follow-up of her atrial flutter/atrial fibrillation  In follow-up today she reports that she is having trouble with her anxiety Started on lexapro Having break these, to talk with primary care  Denies any tachycardia or palpitations concerning for atrial fibrillation or flutter  EKG personally reviewed by myself on todays visit Shows sinus bradycardia rate 58 bpm rare PVC no significant ST or T wave changes  Other past medical history reviewed [place] had trouble climbing up some steps, felt lightheaded Caught to the top of the landing with more trouble, felt dizzy At to sit down, evaluated by a woman who is a paramedic there, heart rate was very low she was told  epsiode of atrial fibrillation 07/11/2016, Admission to the hospital Cardioversion To restore normal sinus rhythm Echocardiogram reviewed showing normal LV function, intact mitral valve  Reported having side effects on flecainide in the past, funny taste, hair loss Denies any further episodes of atrial fibrillation   PMH:   has a past medical history of Abnormal  mammogram, unspecified (2014), Breast cyst, Mitral valve disorder, Stroke ([employer]) (07/03/2014), and SVT (supraventricular tachycardia) ([place]).  PSH:    Past Surgical History:  Procedure Laterality Date  . ABDOMINAL HYSTERECTOMY  2004  . BREAST CYST ASPIRATION  15 YRS AGO  . CARDIOVERSION N/A 09/21/2014   Procedure: CARDIOVERSION;  Surgeon: Josue Hector, MD;  Location: [facility];  Service: Cardiovascular;  Laterality: N/A;  . ELECTROPHYSIOLOGIC STUDY N/A 11/28/2014   Procedure: Cardioversion;  Surgeon: Teodoro Spray, MD;  Location: [facility] [facility];  Service: Cardiovascular;  Laterality: N/A;  . ELECTROPHYSIOLOGIC STUDY N/A 12/27/2014   Procedure: A-Flutter;  Surgeon: Evans Lance, MD;  Location: [facility];  Service: Cardiovascular;  Laterality: N/A;  . ELECTROPHYSIOLOGIC STUDY N/A 11/28/2014   Procedure: Cardioversion;  Surgeon: Teodoro Spray, MD;  Location: [facility];  Service: Cardiovascular;  Laterality: N/A;  Have discussed with Dr. Marcello Moores  . ELECTROPHYSIOLOGIC STUDY N/A 07/13/2016   Procedure: Cardioversion;  Surgeon: Wellington Hampshire, MD;  Location: [facility];  Service: Cardiovascular;  Laterality: N/A;  . EYE SURGERY  2007  . MITRAL VALVE REPAIR  05/2014  . RADIOLOGY WITH ANESTHESIA N/A 07/03/2014   Procedure: RADIOLOGY WITH ANESTHESIA;  Surgeon: Rob Hickman, MD;  Location: [facility];  Service: Radiology;  Laterality: N/A;    Current Outpatient Medications  Medication Sig Dispense Refill  . amiodarone (PACERONE) 200 MG tablet TAKE 1 TABLET BY MOUTH EVERY DAY 30 tablet 0  . apixaban (ELIQUIS) 5 MG TABS tablet Take 1 tablet (5 mg total) by mouth 2 (two) times daily. 60 tablet  0  . carvedilol (COREG) 3.125 MG tablet TAKE 1 TABLET (3.125 MG TOTAL) BY MOUTH 2 (TWO) TIMES DAILY. 180 tablet 3  . cholecalciferol (VITAMIN D) 400 units TABS tablet Take 400 Units by mouth daily.    Marland Kitchen escitalopram (LEXAPRO) 10 MG tablet Take 10 mg by mouth daily.    . magnesium oxide (MAG-OX)  400 (241.3 MG) MG tablet Take 1 tablet (400 mg total) by mouth 2 (two) times daily. 60 tablet 5   No current facility-administered medications for this visit.     Allergies:   Sulfa antibiotics   Social History:  The patient  reports that she has quit smoking. She has never used smokeless tobacco. She reports that she does not drink alcohol or use drugs.   Family History:   family history includes Breast cancer in her maternal aunt; Cancer in her father and unknown relative; Diabetes in her unknown relative.    Review of Systems: Review of Systems  Constitutional: Negative.   Respiratory: Negative.   Cardiovascular: Negative.   Gastrointestinal: Negative.   Musculoskeletal: Negative.   Neurological: Negative.   Psychiatric/Behavioral: Negative.   All other systems reviewed and are negative.    PHYSICAL EXAM: VS:  BP 114/82 (BP Location: Left Arm, Patient Position: Sitting, Cuff Size: Normal)   Pulse (!) 58   Ht 5\' 9"  (1.753 m)   Wt 160 lb 12 oz (72.9 kg)   BMI 23.74 kg/m  , BMI Body mass index is 23.74 kg/m.  Constitutional:  oriented to person, place, and time. No distress.  HENT:  Head: Normocephalic and atraumatic.  Eyes:  no discharge. No scleral icterus.  Neck: Normal range of motion. Neck supple. No JVD present.  Cardiovascular: Normal rate, regular rhythm, normal heart sounds and intact distal pulses. Exam reveals no gallop and no friction rub. No edema No murmur heard. Pulmonary/Chest: Effort normal and breath sounds normal. No stridor. No respiratory distress.  no wheezes.  no rales.  no tenderness.  Abdominal: Soft.  no distension.  no tenderness.  Musculoskeletal: Normal range of motion.  no  tenderness or deformity.  Neurological:  normal muscle tone. Coordination normal. No atrophy Skin: Skin is warm and dry. No rash noted. not diaphoretic.  Psychiatric:  normal mood and affect. behavior is normal. Thought content normal.    Recent Labs: No results found  for requested labs within last 8760 hours.    Lipid Panel Lab Results  Component Value Date   CHOL 132 07/04/2014   HDL 46 07/04/2014   LDLCALC 55 07/04/2014   TRIG 153 (H) 07/04/2014      Wt Readings from Last 3 Encounters:  04/19/18 160 lb 12 oz (72.9 kg)  04/19/17 155 lb 8 oz (70.5 kg)  11/24/16 160 lb (72.6 kg)       ASSESSMENT AND PLAN:  Atypical atrial flutter ([place]) - Plan: EKG 12-Lead Maintaining normal sinus rhythm,  Asymptomatic bradycardia and asymptomatic PVCs Continue carvedilol and amiodarone Amiodarone surveillance labs normal ----Previous records requested and were reviewed, cardiac catheterization from April 25, 2014 showing no coronary disease  Essential hypertension - Plan: EKG 12-Lead Prior history of orthostasis No recurrent symptoms No changes to her medications  Paroxysmal atrial fibrillation ([place]) - Plan: EKG 12-Lead Plan as above for atrial flutter  Cerebrovascular accident (CVA) due to embolism of precerebral artery ([place]) - Plan: EKG 12-Lead Tolerating anticoagulation, eliquis 5 mg twice a day Stable  S/P MVR (mitral valve repair) - Plan: EKG 12-Lead  well-functioning mitral valve  on prior echocardiogram No significant murmur on exam No significant change in clinical status  Records requested, reviewed in detail  Total encounter time more than 45 minutes  Greater than 50% was spent in counseling and coordination of care with the patient  Disposition:   F/U  12 months   Orders Placed This Encounter  Procedures  . EKG 12-Lead     Signed, Esmond Plants, M.D., Ph.D. 04/19/2018  [place], [place]

## 2018-04-19 ENCOUNTER — Encounter: Payer: Self-pay | Admitting: Cardiovascular Disease

## 2018-04-19 ENCOUNTER — Ambulatory Visit: Payer: PPO | Admitting: Cardiovascular Disease

## 2018-04-19 VITALS — BP 114/82 | HR 58 | Ht 69.0 in | Wt 160.8 lb

## 2018-04-19 DIAGNOSIS — I471 Supraventricular tachycardia: Secondary | ICD-10-CM

## 2018-04-19 DIAGNOSIS — I483 Typical atrial flutter: Secondary | ICD-10-CM | POA: Diagnosis not present

## 2018-04-19 DIAGNOSIS — I10 Essential (primary) hypertension: Secondary | ICD-10-CM

## 2018-04-19 DIAGNOSIS — Z9889 Other specified postprocedural states: Secondary | ICD-10-CM

## 2018-04-19 DIAGNOSIS — I480 Paroxysmal atrial fibrillation: Secondary | ICD-10-CM

## 2018-04-19 DIAGNOSIS — I4719 Other supraventricular tachycardia: Secondary | ICD-10-CM

## 2018-04-19 DIAGNOSIS — I484 Atypical atrial flutter: Secondary | ICD-10-CM | POA: Diagnosis not present

## 2018-04-19 DIAGNOSIS — I6310 Cerebral infarction due to embolism of unspecified precerebral artery: Secondary | ICD-10-CM | POA: Diagnosis not present

## 2018-04-19 DIAGNOSIS — Z23 Encounter for immunization: Secondary | ICD-10-CM

## 2018-04-19 MED ORDER — AMIODARONE HCL 200 MG PO TABS: 200.0000 mg | ORAL_TABLET | Freq: Every day | ORAL | 2 refills | Status: DC

## 2018-04-19 MED ORDER — APIXABAN 5 MG PO TABS: 5.0000 mg | ORAL_TABLET | Freq: Two times a day (BID) | ORAL | 2 refills | Status: DC

## 2018-04-19 MED ORDER — CARVEDILOL 3.125 MG PO TABS: 3.1250 mg | ORAL_TABLET | Freq: Two times a day (BID) | ORAL | 3 refills | Status: DC

## 2018-04-19 NOTE — Patient Instructions (Addendum)
We will request your cardiac cath results from 2015 From [facility]   Medication Instructions:   No medication changes made  Labwork:  No new labs needed  Testing/Procedures:  No further testing at this time   Follow-Up: It was a pleasure seeing you in the office today. Please call us if you have new issues that need to be addressed before your next appt.  [phone]  Your physician wants you to follow-up in: 12 months.  You will receive a reminder letter in the mail two months in advance. If you don't receive a letter, please call our office to schedule the follow-up appointment.  If you need a refill on your cardiac medications before your next appointment, please call your pharmacy.  For educational health videos Log in to : [email or web address] Or : [email or web address], password : [email or web address]

## 2018-05-05 ENCOUNTER — Ambulatory Visit
Admission: RE | Admit: 2018-05-05 | Discharge: 2018-05-05 | Disposition: A | Discharge status: Home / Self Care | Payer: PPO | Source: Ambulatory Visit | Attending: Internal Medicine | Admitting: Internal Medicine

## 2018-05-05 DIAGNOSIS — Z1231 Encounter for screening mammogram for malignant neoplasm of breast: Secondary | ICD-10-CM | POA: Diagnosis not present

## 2018-05-09 ENCOUNTER — Other Ambulatory Visit: Payer: Self-pay | Admitting: Internal Medicine

## 2018-05-09 DIAGNOSIS — R928 Other abnormal and inconclusive findings on diagnostic imaging of breast: Secondary | ICD-10-CM

## 2018-05-09 DIAGNOSIS — N6310 Unspecified lump in the right breast, unspecified quadrant: Secondary | ICD-10-CM

## 2018-05-19 ENCOUNTER — Ambulatory Visit
Admission: RE | Admit: 2018-05-19 | Discharge: 2018-05-19 | Disposition: A | Discharge status: Home / Self Care | Payer: PPO | Source: Ambulatory Visit | Attending: Internal Medicine | Admitting: Internal Medicine

## 2018-05-19 DIAGNOSIS — N6310 Unspecified lump in the right breast, unspecified quadrant: Secondary | ICD-10-CM | POA: Insufficient documentation

## 2018-05-19 DIAGNOSIS — R928 Other abnormal and inconclusive findings on diagnostic imaging of breast: Secondary | ICD-10-CM

## 2018-05-19 DIAGNOSIS — N6001 Solitary cyst of right breast: Secondary | ICD-10-CM | POA: Diagnosis not present

## 2018-08-04 DIAGNOSIS — Z79899 Other long term (current) drug therapy: Secondary | ICD-10-CM | POA: Diagnosis not present

## 2018-08-04 DIAGNOSIS — E7800 Pure hypercholesterolemia, unspecified: Secondary | ICD-10-CM | POA: Diagnosis not present

## 2018-08-04 DIAGNOSIS — I10 Essential (primary) hypertension: Secondary | ICD-10-CM | POA: Diagnosis not present

## 2018-08-11 DIAGNOSIS — F419 Anxiety disorder, unspecified: Secondary | ICD-10-CM | POA: Diagnosis not present

## 2018-08-11 DIAGNOSIS — Z0000 Encounter for general adult medical examination without abnormal findings: Secondary | ICD-10-CM | POA: Diagnosis not present

## 2018-08-11 DIAGNOSIS — M25562 Pain in left knee: Secondary | ICD-10-CM | POA: Diagnosis not present

## 2018-08-11 DIAGNOSIS — I480 Paroxysmal atrial fibrillation: Secondary | ICD-10-CM | POA: Diagnosis not present

## 2018-08-11 DIAGNOSIS — E7800 Pure hypercholesterolemia, unspecified: Secondary | ICD-10-CM | POA: Diagnosis not present

## 2018-08-11 DIAGNOSIS — I10 Essential (primary) hypertension: Secondary | ICD-10-CM | POA: Diagnosis not present

## 2018-08-11 DIAGNOSIS — Z1211 Encounter for screening for malignant neoplasm of colon: Secondary | ICD-10-CM | POA: Diagnosis not present

## 2018-08-11 DIAGNOSIS — Z79899 Other long term (current) drug therapy: Secondary | ICD-10-CM | POA: Diagnosis not present

## 2018-08-11 DIAGNOSIS — R0609 Other forms of dyspnea: Secondary | ICD-10-CM | POA: Diagnosis not present

## 2018-09-01 DIAGNOSIS — M25562 Pain in left knee: Secondary | ICD-10-CM | POA: Diagnosis not present

## 2019-02-02 DIAGNOSIS — I10 Essential (primary) hypertension: Secondary | ICD-10-CM | POA: Diagnosis not present

## 2019-02-02 DIAGNOSIS — E7800 Pure hypercholesterolemia, unspecified: Secondary | ICD-10-CM | POA: Diagnosis not present

## 2019-02-02 DIAGNOSIS — Z79899 Other long term (current) drug therapy: Secondary | ICD-10-CM | POA: Diagnosis not present

## 2019-02-03 ENCOUNTER — Other Ambulatory Visit: Payer: Self-pay

## 2019-02-03 NOTE — Telephone Encounter (Signed)
Incoming fax from [facility].  Refill needed for Eliquis.

## 2019-02-03 NOTE — Telephone Encounter (Signed)
Eliquis 5mg  refill request received; pt is 73 yrs old, wt-72.9kg, Crea-0.70 via [facility] at [facility] on 12/24/2017 so pt needs updated labs, last seen by Dr. Rockey Situ on 04/19/2018  Called pt to make pt aware that she needs some updated labs so we can ensure kidney function remains stable. Had to leave a message for the pt and will await for her to return our call so we can get her scheduled.

## 2019-02-06 MED ORDER — APIXABAN 5 MG PO TABS: 5.0000 mg | ORAL_TABLET | Freq: Two times a day (BID) | ORAL | 1 refills | Status: DC

## 2019-02-06 NOTE — Telephone Encounter (Signed)
Spoke w/ pt. She reports labs were drawn at PCP's office last Tuesday for her annual CPE. BMET drawn 02/02/19 - wt 72.9, age 73, SCr 0.9, CrCl 66.94. Eliquis refill sent in as requested.  Pt is appreciative of the call.

## 2019-02-06 NOTE — Telephone Encounter (Signed)
Patient returning call  States that she just had labs done at [facility]  Please call to discuss for refill

## 2019-02-22 ENCOUNTER — Other Ambulatory Visit: Payer: Self-pay | Admitting: Internal Medicine

## 2019-02-22 DIAGNOSIS — I480 Paroxysmal atrial fibrillation: Secondary | ICD-10-CM | POA: Diagnosis not present

## 2019-02-22 DIAGNOSIS — R945 Abnormal results of liver function studies: Secondary | ICD-10-CM | POA: Diagnosis not present

## 2019-02-22 DIAGNOSIS — Z79899 Other long term (current) drug therapy: Secondary | ICD-10-CM | POA: Diagnosis not present

## 2019-02-22 DIAGNOSIS — E7800 Pure hypercholesterolemia, unspecified: Secondary | ICD-10-CM | POA: Diagnosis not present

## 2019-02-22 DIAGNOSIS — R600 Localized edema: Secondary | ICD-10-CM | POA: Diagnosis not present

## 2019-02-22 DIAGNOSIS — I10 Essential (primary) hypertension: Secondary | ICD-10-CM | POA: Diagnosis not present

## 2019-02-22 DIAGNOSIS — R7989 Other specified abnormal findings of blood chemistry: Secondary | ICD-10-CM

## 2019-02-24 ENCOUNTER — Other Ambulatory Visit: Payer: Self-pay

## 2019-02-24 ENCOUNTER — Ambulatory Visit
Admission: RE | Admit: 2019-02-24 | Discharge: 2019-02-24 | Disposition: A | Discharge status: Home / Self Care | Payer: PPO | Source: Ambulatory Visit | Attending: Internal Medicine | Admitting: Internal Medicine

## 2019-02-24 DIAGNOSIS — K8020 Calculus of gallbladder without cholecystitis without obstruction: Secondary | ICD-10-CM | POA: Diagnosis not present

## 2019-02-24 DIAGNOSIS — R945 Abnormal results of liver function studies: Secondary | ICD-10-CM | POA: Diagnosis not present

## 2019-02-24 DIAGNOSIS — R7989 Other specified abnormal findings of blood chemistry: Secondary | ICD-10-CM

## 2019-02-24 DIAGNOSIS — K7689 Other specified diseases of liver: Secondary | ICD-10-CM | POA: Diagnosis not present

## 2019-02-24 DIAGNOSIS — N281 Cyst of kidney, acquired: Secondary | ICD-10-CM | POA: Diagnosis not present

## 2019-03-01 ENCOUNTER — Ambulatory Visit: Payer: PPO

## 2019-03-16 ENCOUNTER — Ambulatory Visit (INDEPENDENT_AMBULATORY_CARE_PROVIDER_SITE_OTHER): Payer: PPO | Admitting: Vascular Surgery

## 2019-03-16 ENCOUNTER — Other Ambulatory Visit: Payer: Self-pay

## 2019-03-16 ENCOUNTER — Encounter (INDEPENDENT_AMBULATORY_CARE_PROVIDER_SITE_OTHER): Payer: Self-pay | Admitting: Vascular Surgery

## 2019-03-16 VITALS — BP 126/83 | HR 63 | Resp 10 | Ht 69.0 in | Wt 162.0 lb

## 2019-03-16 DIAGNOSIS — E7800 Pure hypercholesterolemia, unspecified: Secondary | ICD-10-CM | POA: Diagnosis not present

## 2019-03-16 DIAGNOSIS — I890 Lymphedema, not elsewhere classified: Secondary | ICD-10-CM | POA: Diagnosis not present

## 2019-03-16 DIAGNOSIS — I480 Paroxysmal atrial fibrillation: Secondary | ICD-10-CM

## 2019-03-16 DIAGNOSIS — I10 Essential (primary) hypertension: Secondary | ICD-10-CM

## 2019-03-16 DIAGNOSIS — R002 Palpitations: Secondary | ICD-10-CM | POA: Insufficient documentation

## 2019-03-16 DIAGNOSIS — I872 Venous insufficiency (chronic) (peripheral): Secondary | ICD-10-CM

## 2019-03-16 NOTE — Progress Notes (Signed)
MRN : [id number]  Colleen Williamson is a 73 y.o. ([birth year]) female who presents with chief complaint of  Chief Complaint  Patient presents with   New Patient (Initial Visit)  .  History of Present Illness:   Patient is seen for evaluation of leg swelling. The patient first noticed the swelling remotely but is now concerned because of a significant increase in the overall edema. The swelling is associated with pain and discoloration. The patient notes that in the morning the legs are significantly improved but they steadily worsened throughout the course of the day. Elevation makes the legs better, dependency makes them much worse.   There is no history of ulcerations associated with the swelling.   The patient denies any recent changes in their medications.  The patient has not been wearing graduated compression.  The patient has no had any past angiography, interventions or vascular surgery.  The patient denies a history of DVT or PE. There is no prior history of phlebitis. There is no history of primary lymphedema.  There is no history of radiation treatment to the groin or pelvis No history of malignancies. No history of trauma or groin or pelvic surgery. No history of foreign travel or parasitic infections area    Current Meds  Medication Sig   amiodarone (PACERONE) 200 MG tablet Take 1 tablet (200 mg total) by mouth daily.   apixaban (ELIQUIS) 5 MG TABS tablet Take 1 tablet (5 mg total) by mouth 2 (two) times daily.   carvedilol (COREG) 3.125 MG tablet Take 1 tablet (3.125 mg total) by mouth 2 (two) times daily.   cholecalciferol (VITAMIN D) 400 units TABS tablet Take 400 Units by mouth daily.   escitalopram (LEXAPRO) 10 MG tablet Take 10 mg by mouth daily.   magnesium oxide (MAG-OX) 400 (241.3 MG) MG tablet Take 1 tablet (400 mg total) by mouth 2 (two) times daily.    Past Medical History:  Diagnosis Date   Abnormal mammogram, unspecified 2014   Breast  cyst    Mitral valve disorder    mitral valve repair   Stroke ([place]) 07/03/2014   SVT (supraventricular tachycardia) ([place])     Past Surgical History:  Procedure Laterality Date   ABDOMINAL HYSTERECTOMY  2004   BREAST CYST ASPIRATION  15 YRS AGO   CARDIOVERSION N/A 09/21/2014   Procedure: CARDIOVERSION;  Surgeon: Josue Hector, MD;  Location: [facility];  Service: Cardiovascular;  Laterality: N/A;   ELECTROPHYSIOLOGIC STUDY N/A 11/28/2014   Procedure: Cardioversion;  Surgeon: Teodoro Spray, MD;  Location: [facility];  Service: Cardiovascular;  Laterality: N/A;   ELECTROPHYSIOLOGIC STUDY N/A 12/27/2014   Procedure: A-Flutter;  Surgeon: Evans Lance, MD;  Location: [facility];  Service: Cardiovascular;  Laterality: N/A;   ELECTROPHYSIOLOGIC STUDY N/A 11/28/2014   Procedure: Cardioversion;  Surgeon: Teodoro Spray, MD;  Location: [facility];  Service: Cardiovascular;  Laterality: N/A;  Have discussed with Dr. Marcello Moores   ELECTROPHYSIOLOGIC STUDY N/A 07/13/2016   Procedure: Cardioversion;  Surgeon: Wellington Hampshire, MD;  Location: [facility];  Service: Cardiovascular;  Laterality: N/A;   EYE SURGERY  2007   MITRAL VALVE REPAIR  05/2014   RADIOLOGY WITH ANESTHESIA N/A 07/03/2014   Procedure: RADIOLOGY WITH ANESTHESIA;  Surgeon: Rob Hickman, MD;  Location: [facility];  Service: Radiology;  Laterality: N/A;    Social History Social History   Tobacco Use   Smoking status: Former Smoker   Smokeless tobacco: Never  Used   Tobacco comment: quit smoking in her 20's  Substance Use Topics   Alcohol use: No    Alcohol/week: 0.0 standard drinks   Drug use: No    Family History Family History  Problem Relation Age of Onset   Cancer Father    Cancer Unknown        BOTH SIDES OF THE FAMILY   Diabetes Unknown        MATERNAL SIDE OF THE FAMILY   Breast cancer Maternal Aunt   No family history of bleeding/clotting disorders, porphyria or autoimmune  disease   Allergies  Allergen Reactions   Sulfa Antibiotics Nausea Only     REVIEW OF SYSTEMS (Negative unless checked)  Constitutional: [] Weight loss  [] Fever  [] Chills Cardiac: [] Chest pain   [] Chest pressure   [] Palpitations   [] Shortness of breath when laying flat   [] Shortness of breath with exertion. Vascular:  [] Pain in legs with walking   [x] Pain in legs at rest  [] History of DVT   [] Phlebitis   [x] Swelling in legs   [x] Varicose veins   [] Non-healing ulcers Pulmonary:   [] Uses home oxygen   [] Productive cough   [] Hemoptysis   [] Wheeze  [] COPD   [] Asthma Neurologic:  [] Dizziness   [] Seizures   [] History of stroke   [] History of TIA  [] Aphasia   [] Vissual changes   [] Weakness or numbness in arm   [] Weakness or numbness in leg Musculoskeletal:   [] Joint swelling   [] Joint pain   [] Low back pain Hematologic:  [] Easy bruising  [] Easy bleeding   [] Hypercoagulable state   [] Anemic Gastrointestinal:  [] Diarrhea   [] Vomiting  [] Gastroesophageal reflux/heartburn   [] Difficulty swallowing. Genitourinary:  [] Chronic kidney disease   [] Difficult urination  [] Frequent urination   [] Blood in urine Skin:  [] Rashes   [] Ulcers  Psychological:  [] History of anxiety   []  History of major depression.  Physical Examination  Vitals:   03/16/19 0848  BP: 126/83  Pulse: 63  Resp: 10  Weight: 162 lb (73.5 kg)  Height: 5\' 9"  (1.753 m)   Body mass index is 23.92 kg/m. Gen: WD/WN, NAD Head: [place]/AT, No temporalis wasting.  Ear/Nose/Throat: Hearing grossly intact, nares w/o erythema or drainage, poor dentition Eyes: PER, EOMI, sclera nonicteric.  Neck: Supple, no masses.  No bruit or JVD.  Pulmonary:  Good air movement, clear to auscultation bilaterally, no use of accessory muscles.  Cardiac: RRR, normal S1, S2, no Murmurs. Vascular: scattered varicosities present bilaterally more so at the ankle level and right > left.  Mild venous stasis changes to the legs bilaterally.  1+ soft pitting edema  right > left Vessel Right Left  PT Palpable Palpable  DP Palpable Palpable  Gastrointestinal: soft, non-distended. No guarding/no peritoneal signs.  Musculoskeletal: M/S 5/5 throughout.  No deformity or atrophy.  Neurologic: CN 2-12 intact. Pain and light touch intact in extremities.  Symmetrical.  Speech is fluent. Motor exam as listed above. Psychiatric: Judgment intact, Mood & affect appropriate for pt's clinical situation. Dermatologic: mild venous rashes no ulcers noted.  No changes consistent with cellulitis. Lymph : No Cervical lymphadenopathy, no lichenification or skin changes of chronic lymphedema.  CBC Lab Results  Component Value Date   WBC 4.9 07/13/2016   HGB 14.2 07/13/2016   HCT 42.1 07/13/2016   MCV 85.2 07/13/2016   PLT 186 07/13/2016    BMET    Component Value Date/Time   NA 141 07/13/2016 0511   NA 141 11/23/2013 1245   K 4.1  07/13/2016 0511   K 3.8 11/23/2013 1245   CL 109 07/13/2016 0511   CL 105 11/23/2013 1245   CO2 27 07/13/2016 0511   CO2 31 11/23/2013 1245   GLUCOSE 89 07/13/2016 0511   GLUCOSE 94 11/23/2013 1245   BUN 11 07/13/2016 0511   BUN 11 11/23/2013 1245   CREATININE 0.55 07/13/2016 0511   CREATININE 0.57 (L) 11/23/2013 1245   CALCIUM 9.2 07/13/2016 0511   CALCIUM 9.0 11/23/2013 1245   GFRNONAA >60 07/13/2016 0511   GFRNONAA >60 11/23/2013 1245   GFRAA >60 07/13/2016 0511   GFRAA >60 11/23/2013 1245   CrCl cannot be calculated (Patient's most recent lab result is older than the maximum 21 days allowed.).  COAG Lab Results  Component Value Date   INR 1.19 07/05/2014   INR 1.07 07/03/2014   INR 1.0 11/23/2013    Radiology US Abdomen Complete  Result Date: 02/24/2019 CLINICAL DATA:  73 year old female with elevated LFTs. EXAM: ABDOMEN ULTRASOUND COMPLETE COMPARISON:  11/29/2013 CT FINDINGS: Gallbladder: Mobile gallstones are identified, the largest measuring 8 mm. There is no evidence of gallbladder wall thickening,  pericholecystic fluid or sonographic Murphy sign. Common bile duct: Diameter: 7 mm. No evidence of intrahepatic or extrahepatic biliary dilatation. Liver: A 1.5 cm LEFT hepatic cyst is identified. No other hepatic abnormalities are present. Within normal limits in parenchymal echogenicity. Portal vein is patent on color Doppler imaging with normal direction of blood flow towards the liver. IVC: No abnormality visualized. Pancreas: Visualized portion unremarkable. Spleen: Size and appearance within normal limits. Right Kidney: Length: 11.2 cm. Echogenicity within normal limits. No mass or hydronephrosis visualized. Left Kidney: Length: 10.4 cm. Renal cysts are identified, the largest measuring 3.2 cm. Echogenicity within normal limits. No solid mass or hydronephrosis visualized. Abdominal aorta: No aneurysm visualized. Other findings: None. IMPRESSION: 1. No significant hepatic abnormalities.  No biliary dilatation. 2. Cholelithiasis without evidence of acute cholecystitis. 3. No other significant abnormalities. Electronically Signed   By: Margarette Canada M.D.   On: 02/24/2019 10:42     Assessment/Plan 1. Chronic venous insufficiency Recommend:  The patient is complaining of leg swelling associated with some pain and increased venous changes of the ankles.    I have had a long discussion with the patient regarding  Venous insufficiency and lymphedema and why they cause symptoms.  Patient will begin wearing graduated compression stockings on a daily basis, beginning first thing in the morning and removing them in the evening. The patient is instructed specifically not to sleep in the stockings.    In addition, behavioral modification including elevation during the day will be initiated, utilizing a recliner was recommended.  The patient is also instructed to continue exercising such as walking 4-5 times per week.  At this time the patient wishes to continue conservative therapy and is not interested in more  invasive treatments such as laser ablation and sclerotherapy.  The Patient will follow up PRN if the symptoms worsen.  2. Lymphedema No surgery or intervention at this point in time.  I have reviewed my discussion with the patient regarding venous insufficiency and why it causes symptoms. I have discussed with the patient the chronic skin changes that accompany venous insufficiency and the long term sequela such as ulceration. Patient will contnue wearing graduated compression stockings on a daily basis, as this has provided excellent control of his edema. The patient will put the stockings on first thing in the morning and removing them in the evening. The patient  is reminded not to sleep in the stockings.  In addition, behavioral modification including elevation during the day will be initiated. Exercise is strongly encouraged.  Given the patient's good control and lack of any problems regarding the venous insufficiency and lymphedema a lymph pump in not need at this time.  The patient will follow up with me PRN should anything change.  The patient voices agreement with this plan.   3. Paroxysmal atrial fibrillation (HCC) Continue antiarrhythmia medications as already ordered, these medications have been reviewed and there are no changes at this time.  Continue anticoagulation as ordered by Cardiology Service   4. HTN, goal below 140/90 Continue antihypertensive medications as already ordered, these medications have been reviewed and there are no changes at this time.   5. Pure hypercholesterolemia Continue statin as ordered and reviewed, no changes at this time      Hortencia Pilar, MD  03/16/2019 9:52 AM

## 2019-03-22 DIAGNOSIS — Z79899 Other long term (current) drug therapy: Secondary | ICD-10-CM | POA: Diagnosis not present

## 2019-04-19 ENCOUNTER — Other Ambulatory Visit: Payer: Self-pay

## 2019-04-19 ENCOUNTER — Encounter: Payer: Self-pay | Admitting: Nurse Practitioner

## 2019-04-19 ENCOUNTER — Ambulatory Visit: Payer: PPO | Admitting: Nurse Practitioner

## 2019-04-19 VITALS — BP 128/68 | HR 62 | Ht 69.0 in | Wt 164.0 lb

## 2019-04-19 DIAGNOSIS — Z9889 Other specified postprocedural states: Secondary | ICD-10-CM

## 2019-04-19 DIAGNOSIS — E782 Mixed hyperlipidemia: Secondary | ICD-10-CM | POA: Diagnosis not present

## 2019-04-19 DIAGNOSIS — I480 Paroxysmal atrial fibrillation: Secondary | ICD-10-CM

## 2019-04-19 DIAGNOSIS — Z23 Encounter for immunization: Secondary | ICD-10-CM

## 2019-04-19 DIAGNOSIS — Z79899 Other long term (current) drug therapy: Secondary | ICD-10-CM | POA: Diagnosis not present

## 2019-04-19 NOTE — Progress Notes (Signed)
Office Visit    Patient Name: Colleen Williamson Date of Encounter: 04/19/2019  Primary Care Provider:  Idelle Crouch, MD Primary Cardiologist:  Ida Rogue, MD  Chief Complaint    73 y/o ? 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  Past Medical History    Past Medical History:  Diagnosis Date  . Abnormal mammogram, unspecified 2014  . Atypical atrial flutter ([place])    a. 12/2014 s/p RFCA.  . Breast cyst   . History of cardiac cath    a. 03/2014 Cath [facility]): Nl cors.  . Mitral valve disorder    a. 03/2014 s/p MV repair [facility]); b. 06/2016 Echo: EF 55-60%, no rwma, Nl fxn'ing MV w/o stenosis. Mildly dil LA/RA.  Marland Kitchen PAF (paroxysmal atrial fibrillation) ([employer])    a. 06/2016 s/p DCCV; b.  CHA2DS2VASc = 4-->Eliquis. Rhythm mgmt w/ amio (prev intol to flecainide).  Marland Kitchen PSVT (paroxysmal supraventricular tachycardia) ([place])   . Stroke [facility])    a. [id number] embolic L MCA infarct in setting of PAF.   Past Surgical History:  Procedure Laterality Date  . ABDOMINAL HYSTERECTOMY  2004  . BREAST CYST ASPIRATION  15 YRS AGO  . CARDIOVERSION N/A 09/21/2014   Procedure: CARDIOVERSION;  Surgeon: Josue Hector, MD;  Location: [place];  Service: Cardiovascular;  Laterality: N/A;  . ELECTROPHYSIOLOGIC STUDY N/A 11/28/2014   Procedure: Cardioversion;  Surgeon: Teodoro Spray, MD;  Location: [place] CV LAB;  Service: Cardiovascular;  Laterality: N/A;  . ELECTROPHYSIOLOGIC STUDY N/A 12/27/2014   Procedure: A-Flutter;  Surgeon: Evans Lance, MD;  Location: [place] CV LAB;  Service: Cardiovascular;  Laterality: N/A;  . ELECTROPHYSIOLOGIC STUDY N/A 11/28/2014   Procedure: Cardioversion;  Surgeon: Teodoro Spray, MD;  Location: [facility] ORS;  Service:  Cardiovascular;  Laterality: N/A;  Have discussed with Dr. Marcello Moores  . ELECTROPHYSIOLOGIC STUDY N/A 07/13/2016   Procedure: Cardioversion;  Surgeon: Wellington Hampshire, MD;  Location: [facility];  Service: Cardiovascular;  Laterality: N/A;  . EYE SURGERY  2007  . MITRAL VALVE REPAIR  05/2014  . RADIOLOGY WITH ANESTHESIA N/A 07/03/2014   Procedure: RADIOLOGY WITH ANESTHESIA;  Surgeon: Rob Hickman, MD;  Location: [place];  Service: Radiology;  Laterality: N/A;    Allergies  Allergies  Allergen Reactions  . Flecainide     Funny taste; hairloss  . Sulfa Antibiotics Nausea Only    History of Present Illness    73 year old female with above complex past medical history including mitral regurgitation status post mitral valve repair in 2015 at [facility].  Catheterization at that time showed normal coronary arteries.  In December 2015, she was hospitalized with a left MCA territory embolic stroke in the setting of paroxysmal atrial fibrillation and required thrombectomy and initiation of Eliquis.  She subsequently developed atypical atrial flutter requiring multiple cardioversions and also antiarrhythmic therapy including flecainide.  She had recurrent atrial flutter on flecainide and she also did not tolerate flecainide in the setting of experiencing an alteration in taste and hair loss.  She ultimately underwent catheter ablation in 2016.  In 2017, she had recurrent atrial fibrillation and required cardioversion.  She has not had atrial fibrillation since then and has been managed with amiodarone and Eliquis therapy.  She was last seen in clinic in September 2019,  and says that since then she has done well.  She has some degree of chronic dyspnea on exertion that this is been stable and may be even improved.  She exercises 3 to 4 days a week without any changes in activity tolerance, symptoms, or limitations.  She denies chest pain, palpitations, PND, orthopnea, dizziness, syncope, or early satiety.  She  does have mild, dependent bilateral lower extremity swelling and has been seen by vascular.  She has been advised to keep her legs elevated when possible and also wear compression socks.  Home Medications    Prior to Admission medications   Medication Sig Start Date End Date Taking? Authorizing Provider  amiodarone (PACERONE) 200 MG tablet Take 1 tablet (200 mg total) by mouth daily. 04/19/18  Yes Gollan, Kathlene November, MD  apixaban (ELIQUIS) 5 MG TABS tablet Take 1 tablet (5 mg total) by mouth 2 (two) times daily. 02/06/19  Yes Minna Merritts, MD  carvedilol (COREG) 3.125 MG tablet Take 1 tablet (3.125 mg total) by mouth 2 (two) times daily. Patient taking differently: Take 3.125 mg by mouth once.  04/19/18  Yes Minna Merritts, MD  cholecalciferol (VITAMIN D) 400 units TABS tablet Take 400 Units by mouth daily.   Yes [provider]  escitalopram (LEXAPRO) 10 MG tablet Take 10 mg by mouth daily.   Yes [provider]  magnesium oxide (MAG-OX) 400 (241.3 MG) MG tablet Take 1 tablet (400 mg total) by mouth 2 (two) times daily. 05/02/15  Yes Idelle Crouch, MD    Review of Systems    Some degree of chronic dyspnea on exertion which is been stable.  She exercises regularly without limitations.  She has mild dependent lower extremity swelling which has been better with compression socks.  She denies chest pain, palpitations, PND, orthopnea, dizziness, syncope, or early satiety.  All other systems reviewed and are otherwise negative except as noted above.  Physical Exam    VS:  BP 128/68 (BP Location: Left Arm, Patient Position: Sitting, Cuff Size: Normal)   Pulse 62   Ht 5\' 9"  (1.753 m)   Wt 164 lb (74.4 kg)   SpO2 97%   BMI 24.22 kg/m  , BMI Body mass index is 24.22 kg/m. GEN: Well nourished, well developed, in no acute distress. HEENT: normal. Neck: Supple, no JVD, carotid bruits, or masses. Cardiac: RRR, no murmurs, rubs, or gallops. No clubbing, cyanosis, edema.   Radials/DP/PT 2+ and equal bilaterally.  Respiratory:  Respirations regular and unlabored, clear to auscultation bilaterally. GI: Soft, nontender, nondistended, BS + x 4. MS: no deformity or atrophy. Skin: warm and dry, no rash. Neuro:  Strength and sensation are intact. Psych: Normal affect.  Accessory Clinical Findings    ECG personally reviewed by me today -sinus bradycardia, 59, PVC, nonspecific ST and T changes.  Of note, on today's 12-lead, P waves are very difficult to discern however on attached rhythm strip, she has easily identifiable P waves in multiple leads.  No acute changes.  Labs from March 22 2019 Hemoglobin 15.4, hematocrit 47.7, WBC 4.1, platelets 239 Sodium 140, potassium 4.4, chloride 103, CO2 32.1, BUN 20, creatinine 0.8 Calcium 9.6 Total bilirubin 1.4, alkaline phosphatase 109, AST 39, ALT 34 Total protein 6.8, albumin 4.2 Total cholesterol 221, triglycerides 86, HDL 62.6, LDL 141 TSH 1.504  Assessment & Plan    1.  Paroxysmal atrial fibrillation: No recurrence in the past year.  She remains on amiodarone therapy and is in sinus rhythm  today.  Continue low-dose beta-blocker.  She is anticoagulated with Eliquis and had a normal CBC in August.  Regarding surveillance labs, AST/ALT and TSH were normal in August as well.  She has never had PFTs and I will arrange for this.  She does have annual eye exams.  2.  Status post mitral valve repair: Normally functioning valve by echo in 2017.  She is exercising regularly without symptoms or limitations.  3.  Mixed hyperlipidemia: Total cholesterol 221 with an HDL of 60.6 and LDL of 141.  10-year calculated risk is 11.5%.  Previously normal coronary arteries by catheterization in 2015.  She does have prior history of stroke however this was embolic in the setting of paroxysmal atrial fibrillation.  Continue diet and exercise.  4.  Disposition: Follow-up pulmonary function testing in the setting of amiodarone therapy.  She has  requested a flu shot today and this is been provided.  Follow-up in clinic in 1 year or sooner if necessary.  Murray Hodgkins, NP 04/19/2019, 9:58 AM

## 2019-04-19 NOTE — Patient Instructions (Addendum)
Medication Instructions:  Your physician recommends that you continue on your current medications as directed. Please refer to the Current Medication list given to you today.  If you need a refill on your cardiac medications before your next appointment, please call your pharmacy.   Lab work: None ordered If you have labs (blood work) drawn today and your tests are completely normal, you will receive your results only by: Marland Kitchen MyChart Message (if you have MyChart) OR . A paper copy in the mail If you have any lab test that is abnormal or we need to change your treatment, we will call you to review the results.  Testing/Procedures: 1- Pulmonary Function Test  Your physician has recommended that you have a pulmonary function test. Pulmonary Function Tests are a group of tests that measure how well air moves in and out of your lungs. Appt date__10/13___ Time____10 AM arrival at registation desk___ [facility] Lasts aprx 1 hr - No smoking within 1 hr - No alcohol 4 hrs prior - No vigorous exercise 30 min prior - No tight clothing, no girtles - No Albuterol, Xopenex or any other nebulized medications or inhalers 4 hours prior to testing.  - No caffeine 12 hours prior to testing  2- Covid test 1 day prior (10/12) before 11 am CV19 Pre admit testing DRIVE THRU  Please report to the PAT testing site (medical arts [facility]) for your DRIVE THRU covid testing that is required prior to your procedure.  Following covid testing, please remain in quarantine. If you must be around others, please wash hands, avoid touching face and wear your mask.    Follow-Up: At [facility], you and your health needs are our priority.  As part of our continuing mission to provide you with exceptional heart care, we have created designated Provider Care Teams.  These Care Teams include your primary Cardiologist (physician) and Advanced Practice Providers (APPs -  Physician Assistants and Nurse Practitioners)  who all work together to provide you with the care you need, when you need it. You will need a follow up appointment in 12 months.  Please call our office 2 months in advance to schedule this appointment.  You may see Ida Rogue, MD or one of the following Advanced Practice Providers on your designated Care Team:   Murray Hodgkins, NP Christell Faith, PA-C . Marrianne Mood, PA-C  Any Other Special Instructions Will Be Listed Below (If Applicable). N/A

## 2019-05-09 ENCOUNTER — Ambulatory Visit: Payer: PPO

## 2019-07-08 ENCOUNTER — Other Ambulatory Visit: Payer: Self-pay | Admitting: Cardiovascular Disease

## 2019-07-10 NOTE — Telephone Encounter (Signed)
Refill Request.  

## 2019-07-10 NOTE — Telephone Encounter (Signed)
Pt's age 73, wt 74.4 kg, SCr 0.8, CrCl 75.76, last ov w CB 04/19/19.

## 2019-07-26 IMAGING — MG DIGITAL DIAGNOSTIC UNILATERAL RIGHT MAMMOGRAM WITH TOMO AND CAD
4 series · 4 of 12 positions shown · non-contrast
Comparison: Previous exam(s).

CLINICAL DATA: Screening recall for possible right breast mass.

EXAM:
DIGITAL DIAGNOSTIC UNILATERAL RIGHT MAMMOGRAM WITH CAD AND TOMO
RIGHT BREAST ULTRASOUND

[R MLO synth-2D]
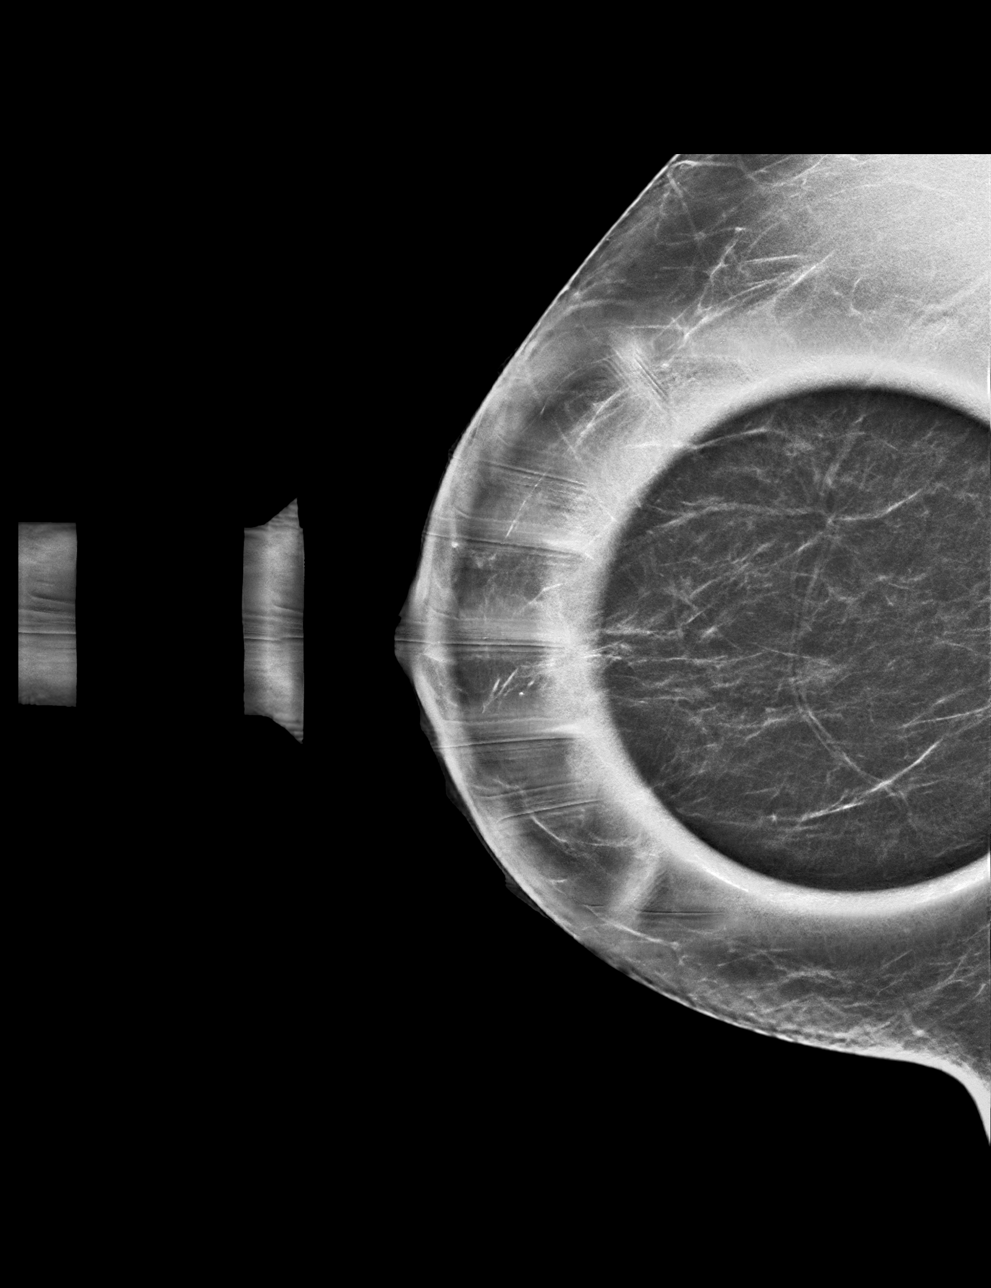

[R CC synth-2D]
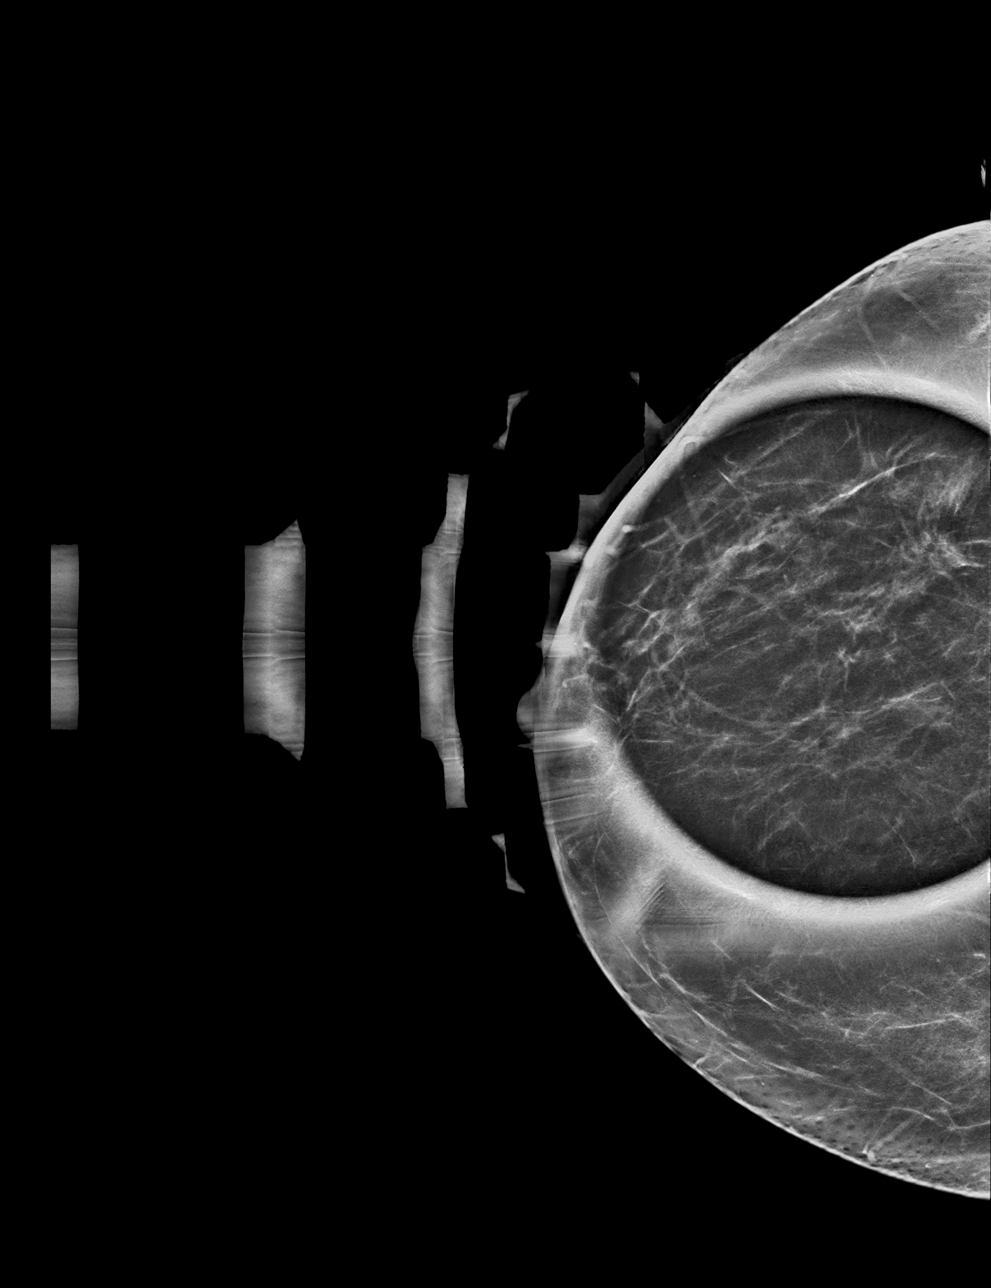

[R MLO tomo · tomo slice 28/55.0]
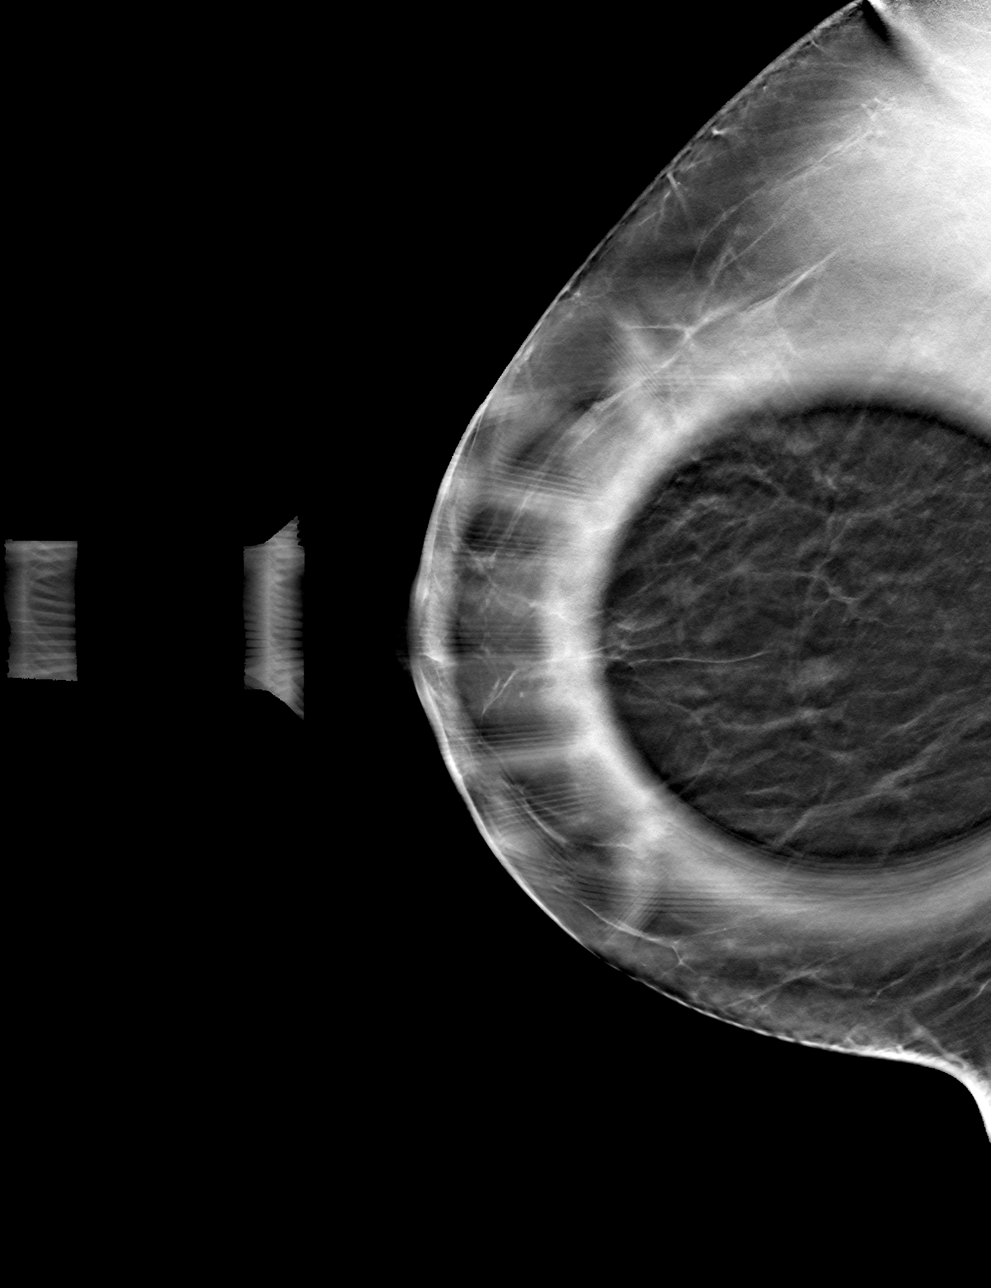

[R CC tomo · tomo slice 27/54.0]
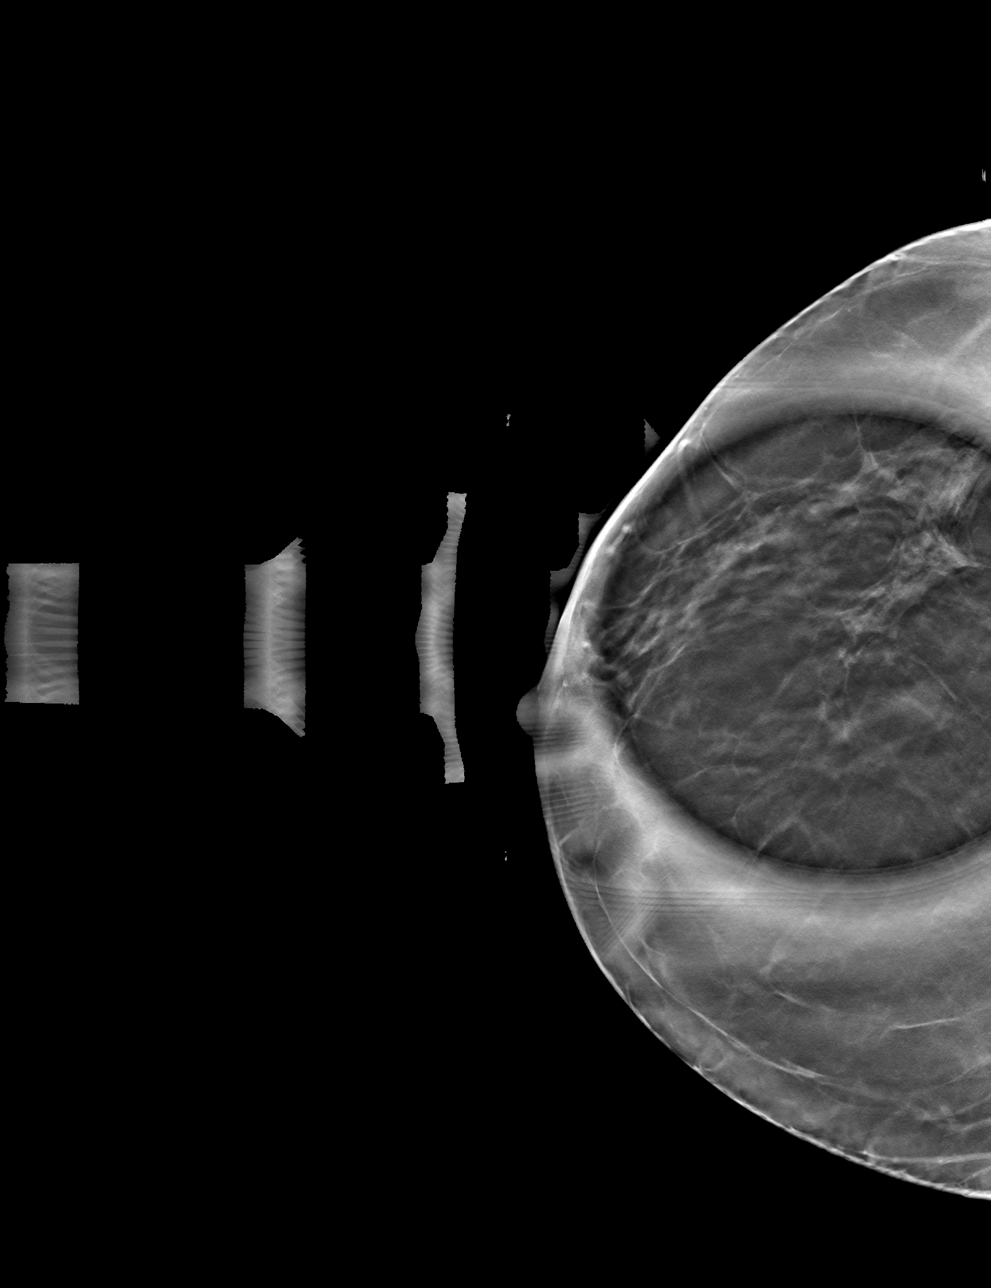

[4 of 12 positions shown; findings below may reference images not displayed]

ACR Breast Density Category b: There are scattered areas of
fibroglandular density.
FINDINGS: Spot compression CC and MLO tomograms were performed of the right
breast. There is an oval circumscribed mass in the lower right
breast measuring approximately 6-7 mm.

Mammographic images were processed with CAD.

Targeted ultrasound of the right breast was performed. There is a
simple cyst at 6 o'clock 1 cm from the nipple measuring 0.7 x 0.4 x
0.5 cm. This corresponds well with the mass seen in the right breast
at mammography.
IMPRESSION: No findings of malignancy in the right breast.

RECOMMENDATION:
Screening mammogram in one year.(Code:IR-A-TK4)

I have discussed the findings and recommendations with the patient.
Results were also provided in writing at the conclusion of the
visit. If applicable, a reminder letter will be sent to the patient
regarding the next appointment.

BI-RADS CATEGORY  2: Benign.

## 2019-08-30 ENCOUNTER — Other Ambulatory Visit: Payer: Self-pay | Admitting: Internal Medicine

## 2019-08-30 DIAGNOSIS — E7800 Pure hypercholesterolemia, unspecified: Secondary | ICD-10-CM | POA: Diagnosis not present

## 2019-08-30 DIAGNOSIS — Z0000 Encounter for general adult medical examination without abnormal findings: Secondary | ICD-10-CM | POA: Diagnosis not present

## 2019-08-30 DIAGNOSIS — Z1211 Encounter for screening for malignant neoplasm of colon: Secondary | ICD-10-CM | POA: Diagnosis not present

## 2019-08-30 DIAGNOSIS — H35373 Puckering of macula, bilateral: Secondary | ICD-10-CM | POA: Diagnosis not present

## 2019-08-30 DIAGNOSIS — Z79899 Other long term (current) drug therapy: Secondary | ICD-10-CM | POA: Diagnosis not present

## 2019-08-30 DIAGNOSIS — Z1231 Encounter for screening mammogram for malignant neoplasm of breast: Secondary | ICD-10-CM

## 2019-08-30 DIAGNOSIS — I10 Essential (primary) hypertension: Secondary | ICD-10-CM | POA: Diagnosis not present

## 2019-08-30 DIAGNOSIS — R0609 Other forms of dyspnea: Secondary | ICD-10-CM | POA: Diagnosis not present

## 2019-08-30 DIAGNOSIS — I480 Paroxysmal atrial fibrillation: Secondary | ICD-10-CM | POA: Diagnosis not present

## 2019-08-30 DIAGNOSIS — F419 Anxiety disorder, unspecified: Secondary | ICD-10-CM | POA: Diagnosis not present

## 2019-08-30 DIAGNOSIS — R0600 Dyspnea, unspecified: Secondary | ICD-10-CM | POA: Diagnosis not present

## 2019-09-07 DIAGNOSIS — Z1211 Encounter for screening for malignant neoplasm of colon: Secondary | ICD-10-CM | POA: Diagnosis not present

## 2019-09-13 DIAGNOSIS — E7800 Pure hypercholesterolemia, unspecified: Secondary | ICD-10-CM | POA: Diagnosis not present

## 2019-09-13 DIAGNOSIS — I10 Essential (primary) hypertension: Secondary | ICD-10-CM | POA: Diagnosis not present

## 2019-09-13 DIAGNOSIS — Z79899 Other long term (current) drug therapy: Secondary | ICD-10-CM | POA: Diagnosis not present

## 2019-11-29 ENCOUNTER — Ambulatory Visit
Admission: RE | Admit: 2019-11-29 | Discharge: 2019-11-29 | Disposition: A | Discharge status: Home / Self Care | Payer: PPO | Source: Ambulatory Visit | Attending: Internal Medicine | Admitting: Internal Medicine

## 2019-11-29 DIAGNOSIS — Z1231 Encounter for screening mammogram for malignant neoplasm of breast: Secondary | ICD-10-CM | POA: Insufficient documentation

## 2020-02-08 DIAGNOSIS — Z9049 Acquired absence of other specified parts of digestive tract: Secondary | ICD-10-CM | POA: Diagnosis not present

## 2020-02-08 DIAGNOSIS — I480 Paroxysmal atrial fibrillation: Secondary | ICD-10-CM | POA: Diagnosis not present

## 2020-02-08 DIAGNOSIS — Z800 Family history of malignant neoplasm of digestive organs: Secondary | ICD-10-CM | POA: Diagnosis not present

## 2020-02-08 DIAGNOSIS — I4892 Unspecified atrial flutter: Secondary | ICD-10-CM | POA: Diagnosis not present

## 2020-02-08 DIAGNOSIS — Z86010 Personal history of colonic polyps: Secondary | ICD-10-CM | POA: Diagnosis not present

## 2020-02-20 ENCOUNTER — Telehealth: Payer: Self-pay | Admitting: Cardiovascular Disease

## 2020-02-20 NOTE — Telephone Encounter (Signed)
    [facility] Pre-operative Risk Assessment    HEARTCARE STAFF: - Please ensure there is not already an duplicate clearance open for this procedure. - Under Visit Info/Reason for Call, type in Other and utilize the format Clearance MM/DD/YY or Clearance TBD. Do not use dashes or single digits. - If request is for dental extraction, please clarify the # of teeth to be extracted.  Request for surgical clearance:  1. What type of surgery is being performed? Colonoscopy   2. When is this surgery scheduled? 05/23/20  3. What type of clearance is required (medical clearance vs. Pharmacy clearance to hold med vs. Both)? Both   4. Are there any medications that need to be held prior to surgery and how long? Eliquis x 3 days   5. Practice name and name of physician performing surgery? [place], [place]  6. What is the office phone number? [phone]   7.   What is the office fax number? [phone]  8.   Anesthesia type (None, local, MAC, general) ? Not listed    Colleen Williamson 02/20/2020, 4:36 PM  _________________________________________________________________   (provider comments below)

## 2020-02-20 NOTE — Telephone Encounter (Signed)
Patient's procedure is not till October 28th. She has an appointment with Dr Rockey Situ in September.  Pre op clearance can be addressed then.  I will remove this from the pool.  Kerin Ransom PA-C 02/20/2020 4:50 PM

## 2020-04-10 ENCOUNTER — Ambulatory Visit: Payer: PPO | Admitting: Cardiovascular Disease

## 2020-04-23 ENCOUNTER — Encounter: Payer: Self-pay | Admitting: Cardiovascular Disease

## 2020-04-23 ENCOUNTER — Ambulatory Visit: Payer: PPO | Admitting: Cardiovascular Disease

## 2020-04-23 ENCOUNTER — Other Ambulatory Visit: Payer: Self-pay

## 2020-04-23 VITALS — BP 128/92 | HR 56 | Ht 69.0 in | Wt 164.0 lb

## 2020-04-23 DIAGNOSIS — E782 Mixed hyperlipidemia: Secondary | ICD-10-CM | POA: Diagnosis not present

## 2020-04-23 DIAGNOSIS — I471 Supraventricular tachycardia: Secondary | ICD-10-CM

## 2020-04-23 DIAGNOSIS — I10 Essential (primary) hypertension: Secondary | ICD-10-CM | POA: Diagnosis not present

## 2020-04-23 DIAGNOSIS — I480 Paroxysmal atrial fibrillation: Secondary | ICD-10-CM

## 2020-04-23 DIAGNOSIS — I6310 Cerebral infarction due to embolism of unspecified precerebral artery: Secondary | ICD-10-CM

## 2020-04-23 DIAGNOSIS — Z9889 Other specified postprocedural states: Secondary | ICD-10-CM

## 2020-04-23 DIAGNOSIS — Z23 Encounter for immunization: Secondary | ICD-10-CM | POA: Diagnosis not present

## 2020-04-23 MED ORDER — CARVEDILOL 3.125 MG PO TABS: 3.1250 mg | ORAL_TABLET | Freq: Every day | ORAL | 3 refills | Status: DC

## 2020-04-23 MED ORDER — APIXABAN 5 MG PO TABS: 5.0000 mg | ORAL_TABLET | Freq: Two times a day (BID) | ORAL | 3 refills | Status: DC

## 2020-04-23 MED ORDER — AMIODARONE HCL 200 MG PO TABS: 200.0000 mg | ORAL_TABLET | Freq: Every day | ORAL | 3 refills | Status: DC

## 2020-04-23 NOTE — Patient Instructions (Signed)
Senior flu shot  Medication Instructions:  No changes  If you need a refill on your cardiac medications before your next appointment, please call your pharmacy.    Lab work: No new labs needed   If you have labs (blood work) drawn today and your tests are completely normal, you will receive your results only by: Marland Kitchen MyChart Message (if you have MyChart) OR . A paper copy in the mail If you have any lab test that is abnormal or we need to change your treatment, we will call you to review the results.   Testing/Procedures: No new testing needed   Follow-Up: At [facility], you and your health needs are our priority.  As part of our continuing mission to provide you with exceptional heart care, we have created designated Provider Care Teams.  These Care Teams include your primary Cardiologist (physician) and Advanced Practice Providers (APPs -  Physician Assistants and Nurse Practitioners) who all work together to provide you with the care you need, when you need it.  . You will need a follow up appointment in 12 months  . Providers on your designated Care Team:   . Murray Hodgkins, NP . Christell Faith, PA-C . Marrianne Mood, PA-C  Any Other Special Instructions Will Be Listed Below (If Applicable).  COVID-19 Vaccine Information can be found at: [email or web address] For questions related to vaccine distribution or appointments, please email [email or web address] or call [phone].

## 2020-04-23 NOTE — Progress Notes (Signed)
Cardiology Office Note  Date:  04/23/2020   ID:  Williamson, Colleen August 30, 1945, MRN [id number]  PCP:  Idelle Crouch, MD   Chief Complaint  Patient presents with  . Follow-up    Annual follow up. Medications verbally reviewed with patient.     HPI:  [name] is a very pleasant 74 year old woman with history of  HTN mitral valve prolapse, repair earlier in 2015 at [facility],  postoperative atrial fibrillation,  stroke,  atrial flutter  underwent DCCV , anti-coagulation with Eliquis,  recurrent atrial flutter while on flecainide  second cardioversion 4/16,  catheter ablation in [place].  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 EF 55 in 06/2016 Chronic SOB She presents for routine follow-up of her atrial flutter/atrial fibrillation  Relatively sedentary Chronic mild shortness of breath Wanting to start an exercise program Denies any tachycardia palpitations concerning for arrhythmia Tolerating medications well  No falls, tolerating Eliquis, low-dose amiodarone, takes carvedilol once a day  EKG personally reviewed by myself on todays visit Shows sinus bradycardia rate 56 bpm  no significant ST or T wave changes  Other past medical history reviewed epsiode of atrial fibrillation 07/11/2016, Admission to the hospital Cardioversion To restore normal sinus rhythm Echocardiogram reviewed showing normal LV function, intact mitral valve  Reported having side effects on flecainide in the past, funny taste, hair loss Denies any further episodes of atrial fibrillation   PMH:   has a past medical history of Abnormal mammogram, unspecified (2014), Atypical atrial flutter (Colleen Williamson), Breast cyst, History of cardiac cath, Mitral valve disorder, PAF (paroxysmal atrial fibrillation) (Colleen Williamson), PSVT (paroxysmal supraventricular tachycardia) (Colleen Williamson), and Stroke (Colleen Williamson).  PSH:    Past Surgical  History:  Procedure Laterality Date  . ABDOMINAL HYSTERECTOMY  2004  . BREAST CYST ASPIRATION  15 YRS AGO  . CARDIOVERSION N/A 09/21/2014   Procedure: CARDIOVERSION;  Surgeon: Josue Hector, MD;  Location: [place];  Service: Cardiovascular;  Laterality: N/A;  . ELECTROPHYSIOLOGIC STUDY N/A 11/28/2014   Procedure: Cardioversion;  Surgeon: Teodoro Spray, MD;  Location: [place] CV LAB;  Service: Cardiovascular;  Laterality: N/A;  . ELECTROPHYSIOLOGIC STUDY N/A 12/27/2014   Procedure: A-Flutter;  Surgeon: Evans Lance, MD;  Location: [place] CV LAB;  Service: Cardiovascular;  Laterality: N/A;  . ELECTROPHYSIOLOGIC STUDY N/A 11/28/2014   Procedure: Cardioversion;  Surgeon: Teodoro Spray, MD;  Location: [facility];  Service: Cardiovascular;  Laterality: N/A;  Have discussed with Dr. Marcello Moores  . ELECTROPHYSIOLOGIC STUDY N/A 07/13/2016   Procedure: Cardioversion;  Surgeon: Wellington Hampshire, MD;  Location: [facility];  Service: Cardiovascular;  Laterality: N/A;  . EYE SURGERY  2007  . MITRAL VALVE REPAIR  05/2014  . RADIOLOGY WITH ANESTHESIA N/A 07/03/2014   Procedure: RADIOLOGY WITH ANESTHESIA;  Surgeon: Rob Hickman, MD;  Location: [place];  Service: Radiology;  Laterality: N/A;    Current Outpatient Medications  Medication Sig Dispense Refill  . amiodarone (PACERONE) 200 MG tablet Take 1 tablet (200 mg total) by mouth daily. 90 tablet 2  . carvedilol (COREG) 3.125 MG tablet Take 1 tablet (3.125 mg total) by mouth 2 (two) times daily. (Patient taking differently: Take 3.125 mg by mouth once. ) 180 tablet 3  . cholecalciferol (VITAMIN D) 400 units TABS tablet Take 400 Units by mouth daily.    Marland Kitchen ELIQUIS 5 MG TABS tablet TAKE 1 TABLET BY MOUTH TWICE A DAY  180 tablet 1  . escitalopram (LEXAPRO) 10 MG tablet Take 10 mg by mouth daily.    . magnesium oxide (MAG-OX) 400 (241.3 MG) MG tablet Take 1 tablet (400 mg total) by mouth 2 (two) times daily. 60 tablet 5  . meclizine (ANTIVERT) 25 MG  tablet Take 25 mg by mouth 3 (three) times daily.     No current facility-administered medications for this visit.    Allergies:   Flecainide and Sulfa antibiotics   Social History:  The patient  reports that she has quit smoking. She has never used smokeless tobacco. She reports that she does not drink alcohol and does not use drugs.   Family History:   family history includes Breast cancer in her maternal aunt; Cancer in her father and another family member; Diabetes in an other family member.    Review of Systems: Review of Systems  Constitutional: Negative.   Respiratory: Negative.   Cardiovascular: Negative.   Gastrointestinal: Negative.   Musculoskeletal: Negative.   Neurological: Negative.   Psychiatric/Behavioral: Negative.   All other systems reviewed and are negative.   PHYSICAL EXAM: VS:  BP (!) 128/92 (BP Location: Left Arm, Patient Position: Sitting, Cuff Size: Normal)   Pulse (!) 56   Ht 5\' 9"  (1.753 m)   Wt 164 lb (74.4 kg)   SpO2 96%   BMI 24.22 kg/m  , BMI Body mass index is 24.22 kg/m.  Constitutional:  oriented to person, place, and time. No distress.  HENT:  Head: Grossly normal Eyes:  no discharge. No scleral icterus.  Neck: No JVD, no carotid bruits  Cardiovascular: Regular rate and rhythm, no murmurs appreciated Pulmonary/Chest: Clear to auscultation bilaterally, no wheezes or rails Abdominal: Soft.  no distension.  no tenderness.  Musculoskeletal: Normal range of motion Neurological:  normal muscle tone. Coordination normal. No atrophy Skin: Skin warm and dry Psychiatric: normal affect, pleasant   Recent Labs: No results found for requested labs within last 8760 hours.    Lipid Panel Lab Results  Component Value Date   CHOL 132 07/04/2014   HDL 46 07/04/2014   LDLCALC 55 07/04/2014   TRIG 153 (H) 07/04/2014      Wt Readings from Last 3 Encounters:  04/23/20 164 lb (74.4 kg)  04/19/19 164 lb (74.4 kg)  03/16/19 162 lb (73.5 kg)        ASSESSMENT AND PLAN:  Atypical atrial flutter ([place]) - Plan: EKG 12-Lead Denies any recurrent arrhythmia, no change to her medications  Shortness of breath  catheterization from April 25, 2014 showing no coronary disease No further work-up at this time  Essential hypertension - Plan: EKG 12-Lead Prior history of orthostasis No recurrent symptoms No changes to her medications  Paroxysmal atrial fibrillation ([place]) - Plan: EKG 12-Lead No recurrent symptoms  Cerebrovascular 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 EKG 12-Lead     Signed, Esmond Plants, M.D., Ph.D. 04/23/2020  [place], [place]

## 2020-04-24 ENCOUNTER — Ambulatory Visit: Payer: PPO | Admitting: Cardiovascular Disease

## 2020-05-07 ENCOUNTER — Telehealth: Payer: Self-pay | Admitting: Cardiovascular Disease

## 2020-05-07 NOTE — Telephone Encounter (Signed)
Patient with diagnosis of afib on Eliquis for anticoagulation.    Procedure: colonscopy Date of procedure: 05/15/20  CHADS2-VASc score of  5 (HTN, AGE, stroke/tia x 2, female)  CrCl 61.8 ml/min  Due to patient history of stroke, would recommend she hold Eliquis ONLY 1 day prior to procedure.  MD is requesting a 3 day hold. Will send to Dr. Rockey Situ for input.

## 2020-05-07 NOTE — Telephone Encounter (Signed)
   [facility] [place] [facility] Pre-operative Risk Assessment    HEARTCARE STAFF: - Please ensure there is not already an duplicate clearance open for this procedure. - Under Visit Info/Reason for Call, type in Other and utilize the format Clearance MM/DD/YY or Clearance TBD. Do not use dashes or single digits. - If request is for dental extraction, please clarify the # of teeth to be extracted.  Request for surgical clearance:  1. What type of surgery is being performed? colonscopy  2. When is this surgery scheduled? 05/15/20  3. What type of clearance is required (medical clearance vs. Pharmacy clearance to hold med vs. Both)? pharm  4. Are there any medications that need to be held prior to surgery and how long? Eliquis 3 days prior  5. Practice name and name of physician performing surgery? [facility]  6. What is the office phone number? [phone]   7.   What is the office fax number? [phone]  8.   Anesthesia type (None, local, MAC, general) ? Monitored  Also requested last OV note   Colleen Williamson 05/07/2020, 8:45 AM  _________________________________________________________________   (provider comments below)

## 2020-05-08 NOTE — Telephone Encounter (Signed)
Acceptable risk for procedure/colonoscopy Okay to hold Eliquis 2 days prior to procedure, would prefer not to hold for 3 days given prior history of stroke

## 2020-05-09 NOTE — Telephone Encounter (Signed)
   Primary Cardiologist: Ida Rogue, MD  Chart reviewed as part of pre-operative protocol coverage. Given past medical history and time since last visit, based on ACC/AHA guidelines, Colleen Williamson would be at acceptable risk for the planned procedure without further cardiovascular testing.   Patient with diagnosis of afib on Eliquis for anticoagulation.    Procedure: colonscopy Date of procedure: 05/15/20  CHADS2-VASc score of  5 (HTN, AGE, stroke/tia x 2, female)  CrCl 61.8 ml/min  She may hold her Eliquis for 2 days prior to procedure, would prefer not to hold for 3 days given her prior history of stroke.  I will route this recommendation to the requesting party via Epic fax function and remove from pre-op pool.  Please call with questions.  [name]. [name] NP-C    05/09/2020, 7:18 AM [name] [address] Office [phone] Fax [phone]

## 2020-05-13 ENCOUNTER — Other Ambulatory Visit: Payer: Self-pay

## 2020-05-13 ENCOUNTER — Other Ambulatory Visit
Admission: RE | Admit: 2020-05-13 | Discharge: 2020-05-13 | Disposition: A | Discharge status: Home / Self Care | Payer: PPO | Source: Ambulatory Visit | Attending: Internal Medicine | Admitting: Internal Medicine

## 2020-05-13 DIAGNOSIS — Z20822 Contact with and (suspected) exposure to covid-19: Secondary | ICD-10-CM | POA: Diagnosis not present

## 2020-05-13 DIAGNOSIS — Z01812 Encounter for preprocedural laboratory examination: Secondary | ICD-10-CM | POA: Insufficient documentation

## 2020-05-13 LAB — SARS CORONAVIRUS 2 (TAT 6-24 HRS): SARS Coronavirus 2: NEGATIVE

## 2020-05-14 ENCOUNTER — Encounter: Payer: Self-pay | Admitting: Internal Medicine

## 2020-05-15 ENCOUNTER — Ambulatory Visit: Payer: PPO | Admitting: Anesthesiology

## 2020-05-15 ENCOUNTER — Encounter
Admission: RE | Disposition: A | Discharge status: Home / Self Care | Payer: Self-pay | Source: Home / Self Care | Attending: Internal Medicine

## 2020-05-15 ENCOUNTER — Ambulatory Visit
Admission: RE | Admit: 2020-05-15 | Discharge: 2020-05-15 | Disposition: A | Discharge status: Home / Self Care | Payer: PPO | Attending: Internal Medicine | Admitting: Internal Medicine

## 2020-05-15 ENCOUNTER — Other Ambulatory Visit: Payer: Self-pay

## 2020-05-15 ENCOUNTER — Encounter: Payer: Self-pay | Admitting: Internal Medicine

## 2020-05-15 DIAGNOSIS — F32A Depression, unspecified: Secondary | ICD-10-CM | POA: Diagnosis not present

## 2020-05-15 DIAGNOSIS — Z87891 Personal history of nicotine dependence: Secondary | ICD-10-CM | POA: Insufficient documentation

## 2020-05-15 DIAGNOSIS — Z882 Allergy status to sulfonamides status: Secondary | ICD-10-CM | POA: Insufficient documentation

## 2020-05-15 DIAGNOSIS — I10 Essential (primary) hypertension: Secondary | ICD-10-CM | POA: Diagnosis not present

## 2020-05-15 DIAGNOSIS — K9189 Other postprocedural complications and disorders of digestive system: Secondary | ICD-10-CM | POA: Diagnosis not present

## 2020-05-15 DIAGNOSIS — Z1211 Encounter for screening for malignant neoplasm of colon: Secondary | ICD-10-CM | POA: Diagnosis not present

## 2020-05-15 DIAGNOSIS — Z888 Allergy status to other drugs, medicaments and biological substances status: Secondary | ICD-10-CM | POA: Diagnosis not present

## 2020-05-15 DIAGNOSIS — I69398 Other sequelae of cerebral infarction: Secondary | ICD-10-CM | POA: Insufficient documentation

## 2020-05-15 DIAGNOSIS — I471 Supraventricular tachycardia: Secondary | ICD-10-CM | POA: Insufficient documentation

## 2020-05-15 DIAGNOSIS — Z980 Intestinal bypass and anastomosis status: Secondary | ICD-10-CM | POA: Diagnosis not present

## 2020-05-15 DIAGNOSIS — K648 Other hemorrhoids: Secondary | ICD-10-CM | POA: Diagnosis not present

## 2020-05-15 DIAGNOSIS — F418 Other specified anxiety disorders: Secondary | ICD-10-CM | POA: Diagnosis not present

## 2020-05-15 DIAGNOSIS — I4892 Unspecified atrial flutter: Secondary | ICD-10-CM | POA: Diagnosis not present

## 2020-05-15 DIAGNOSIS — Z86010 Personal history of colonic polyps: Secondary | ICD-10-CM | POA: Insufficient documentation

## 2020-05-15 DIAGNOSIS — E7800 Pure hypercholesterolemia, unspecified: Secondary | ICD-10-CM | POA: Diagnosis not present

## 2020-05-15 DIAGNOSIS — F419 Anxiety disorder, unspecified: Secondary | ICD-10-CM | POA: Diagnosis not present

## 2020-05-15 HISTORY — DX: Anxiety disorder, unspecified: F41.9

## 2020-05-15 HISTORY — PX: COLONOSCOPY WITH PROPOFOL: SHX5780

## 2020-05-15 SURGERY — COLONOSCOPY WITH PROPOFOL
Anesthesia: General

## 2020-05-15 MED ORDER — PROPOFOL 500 MG/50ML IV EMUL
INTRAVENOUS | Status: DC | PRN
  Administered 2020-05-15: 88 ug/kg/min via INTRAVENOUS

## 2020-05-15 MED ORDER — PROPOFOL 10 MG/ML IV BOLUS
INTRAVENOUS | Status: AC
  Filled 2020-05-15: qty 20

## 2020-05-15 MED ORDER — LIDOCAINE HCL (CARDIAC) PF 100 MG/5ML IV SOSY
PREFILLED_SYRINGE | INTRAVENOUS | Status: DC | PRN
  Administered 2020-05-15: 50 mg via INTRAVENOUS

## 2020-05-15 MED ORDER — SODIUM CHLORIDE 0.9 % IV SOLN: INTRAVENOUS | Status: DC

## 2020-05-15 MED ORDER — PROPOFOL 500 MG/50ML IV EMUL
INTRAVENOUS | Status: AC
  Filled 2020-05-15: qty 50

## 2020-05-15 NOTE — Op Note (Addendum)
[facility] Gastroenterology Patient Name: Colleen Williamson Procedure Date: 05/15/2020 9:03 AM MRN: [id number] Account #: [id number] Date of Birth: 1945/11/29 Admit Type: Outpatient Age: 74 Room: [facility] ENDO ROOM 3 Gender: Female Note Status: Finalized Procedure:             Colonoscopy Indications:           High risk colon cancer surveillance: Personal history                         of colonic polyps Providers:             Benay Pike. Deyna Carbon MD, MD Medicines:             Propofol per Anesthesia Complications:         No immediate complications. Procedure:             Pre-Anesthesia Assessment:                        - The risks and benefits of the procedure and the                         sedation options and risks were discussed with the                         patient. All questions were answered and informed                         consent was obtained.                        - Patient identification and proposed procedure were                         verified prior to the procedure by the nurse. The                         procedure was verified in the procedure room.                        - ASA Grade Assessment: III - A patient with severe                         systemic disease.                        - After reviewing the risks and benefits, the patient                         was deemed in satisfactory condition to undergo the                         procedure.                        After obtaining informed consent, the colonoscope was                         passed under direct vision. Throughout the procedure,  the patient's blood pressure, pulse, and oxygen                         saturations were monitored continuously. The was                         introduced through the anus and advanced to the the                         ileocolonic anastomosis. The Colonoscope was                         introduced through the anus and  advanced to the. The                         patient tolerated the procedure well. The quality of                         the bowel preparation was good. Ileocolonic                         anastomosis were photographed. The colonoscopy was                         technically difficult and complex due to restricted                         mobility of the colon. Successful completion of the                         procedure was aided by withdrawing and reinserting the                         scope. The patient tolerated the procedure well. Findings:      The perianal and digital rectal examinations were normal. Pertinent       negatives include normal sphincter tone and no palpable rectal lesions.      There was evidence of a prior end-to-side ileo-colonic anastomosis in       the ascending colon. This was patent and was characterized by healthy       appearing mucosa. The anastomosis was traversed. Estimated blood loss:       none.      The exam was otherwise without abnormality on direct and retroflexion       views. Impression:            - Patent end-to-side ileo-colonic anastomosis,                         characterized by healthy appearing mucosa.                        - The examination was otherwise normal on direct and                         retroflexion views.                        - No specimens collected. Recommendation:        - Patient has  a contact number available for                         emergencies. The signs and symptoms of potential                         delayed complications were discussed with the patient.                         Return to normal activities tomorrow. Written                         discharge instructions were provided to the patient.                        - Resume previous diet.                        - Continue present medications.                        - Repeat colonoscopy in 5 years for surveillance.                        - Return to GI  office PRN.                        - The findings and recommendations were discussed with                         the patient. Procedure Code(s):     --- Professional ---                        D1761, Colorectal cancer screening; colonoscopy on                         individual at high risk Diagnosis Code(s):     --- Professional ---                        Z98.0, Intestinal bypass and anastomosis status                        Z86.010, Personal history of colonic polyps CPT copyright 2019 American Medical Association. All rights reserved. The codes documented in this report are preliminary and upon coder review may  be revised to meet current compliance requirements. Efrain Sella MD, MD 05/15/2020 9:41:30 AM This report has been signed electronically. Number of Addenda: 0 Note Initiated On: 05/15/2020 9:03 AM Scope Withdrawal Time: 0 hours 3 minutes 54 seconds  Total Procedure Duration: 0 hours 12 minutes 35 seconds  Estimated Blood Loss:  Estimated blood loss: none. Estimated blood loss: none.      [facility]

## 2020-05-15 NOTE — Anesthesia Postprocedure Evaluation (Signed)
Anesthesia Post Note  Patient: Colleen Williamson  Procedure(s) Performed: COLONOSCOPY WITH PROPOFOL (N/A )  Patient location during evaluation: Endoscopy Anesthesia Type: General Level of consciousness: awake and alert and oriented Pain management: pain level controlled Vital Signs Assessment: post-procedure vital signs reviewed and stable Respiratory status: spontaneous breathing, nonlabored ventilation and respiratory function stable Cardiovascular status: blood pressure returned to baseline and stable Postop Assessment: no signs of nausea or vomiting Anesthetic complications: no   No complications documented.   Last Vitals:  Vitals:   05/15/20 0950 05/15/20 1000  BP: 125/64 139/64  Pulse: (!) 56 (!) 49  Resp: 15 15  Temp:    SpO2: 100% 97%    Last Pain:  Vitals:   05/15/20 0930  TempSrc: Temporal  PainSc:                  [name]

## 2020-05-15 NOTE — H&P (Signed)
Outpatient short stay form Pre-procedure 05/15/2020 9:19 AM Colleen Williamson K. Alice Reichert, M.D.  Primary Physician:Jeffrey Sparks, M.D>  Reason for visit:  Personal hx of colon polyps  History of present illness:                           Patient presents for colonoscopy for a personal hx of colon polyps. The patient denies abdominal pain, abnormal weight loss or rectal bleeding.      Current Facility-Administered Medications:  .  0.9 %  sodium chloride infusion, , Intravenous, Continuous, Efrain Sella, MD, Last Rate: 999 mL/hr at 05/15/20 1443, Continued from Pre-op at 05/15/20 0914  Facility-Administered Medications Ordered in Other Encounters:  .  lidocaine (cardiac) 100 mg/9mL (XYLOCAINE) injection 2%, , Intravenous, Anesthesia Intra-op, Gaynelle Cage, CRNA, 50 mg at 05/15/20 0916 .  propofol (DIPRIVAN) 500 MG/50ML infusion, , Intravenous, Continuous PRN, Gaynelle Cage, CRNA, Last Rate: 39.283 mL/hr at 05/15/20 0916, 88 mcg/kg/min at 05/15/20 0916  Medications Prior to Admission  Medication Sig Dispense Refill Last Dose  . carvedilol (COREG) 3.125 MG tablet Take 1 tablet (3.125 mg total) by mouth daily. 90 tablet 3 05/15/2020 at Unknown time  . amiodarone (PACERONE) 200 MG tablet Take 1 tablet (200 mg total) by mouth daily. 90 tablet 3   . apixaban (ELIQUIS) 5 MG TABS tablet Take 1 tablet (5 mg total) by mouth 2 (two) times daily. 180 tablet 3 05/12/2020  . cholecalciferol (VITAMIN D) 400 units TABS tablet Take 400 Units by mouth daily.     Marland Kitchen escitalopram (LEXAPRO) 10 MG tablet Take 10 mg by mouth daily.     . magnesium oxide (MAG-OX) 400 (241.3 MG) MG tablet Take 1 tablet (400 mg total) by mouth 2 (two) times daily. 60 tablet 5   . meclizine (ANTIVERT) 25 MG tablet Take 25 mg by mouth 3 (three) times daily.        Allergies  Allergen Reactions  . Flecainide     Funny taste; hairloss  . Sulfa Antibiotics Nausea Only     Past Medical History:  Diagnosis Date  . Abnormal  mammogram, unspecified 2014  . Anxiety   . 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 12/2014 s/p RFCA.  . Breast cyst   . History of cardiac cath    a. 03/2014 Cath [facility]): Nl cors.  . Mitral valve disorder    a. 03/2014 s/p MV repair [facility]); b. 06/2016 Echo: EF 55-60%, no rwma, Nl fxn'ing MV w/o stenosis. Mildly dil LA/RA.  Marland Kitchen PAF (paroxysmal atrial fibrillation) ([place])    a. 06/2016 s/p DCCV; b.  CHA2DS2VASc = 4-->Eliquis. Rhythm mgmt w/ amio (prev intol to flecainide).  Marland Kitchen PSVT (paroxysmal supraventricular tachycardia) ([place])   . Stroke [facility])    a. 15/4008 embolic L MCA infarct in setting of PAF.    Review of systems:  Otherwise negative.    Physical Exam  Gen: Alert, oriented. Appears stated age.  HEENT: [place]/AT. PERRLA. Lungs: CTA, no wheezes. CV: RR nl S1, S2. Abd: soft, benign, no masses. BS+ Ext: No edema. Pulses 2+    Planned procedures: Proceed with colonoscopy. The patient understands the nature of the planned procedure, indications, risks, alternatives and potential complications including but not limited to bleeding, infection, perforation, damage to internal organs and possible oversedation/side effects from anesthesia. The patient agrees and gives consent to proceed.  Please refer to procedure notes for findings, recommendations and patient disposition/instructions.  Colleen Williamson K. Alice Reichert, M.D. Gastroenterology 05/15/2020  9:19 AM    \

## 2020-05-15 NOTE — Anesthesia Preprocedure Evaluation (Signed)
Anesthesia Evaluation  Patient identified by MRN, date of birth, ID band Patient awake    Reviewed: Allergy & Precautions, NPO status , Patient's Chart, lab work & pertinent test results  History of Anesthesia Complications Negative for: history of anesthetic complications  Airway Mallampati: III  TM Distance: >3 FB Neck ROM: Full    Dental no notable dental hx.    Pulmonary neg sleep apnea, neg COPD, former smoker,    breath sounds clear to auscultation- rhonchi (-) wheezing      Cardiovascular Exercise Tolerance: Good hypertension, Pt. on medications (-) CAD, (-) Past MI, (-) Cardiac Stents and (-) CABG + Valvular Problems/Murmurs (s/p MVR)  Rhythm:Regular Rate:Normal - Systolic murmurs and - Diastolic murmurs Echo 16/10/96: - Left ventricle: The cavity size was normal. Wall thickness was  normal. Systolic function was normal. The estimated ejection  fraction was in the range of 55% to 60%. Indeterminant diastolic  function (atrial flutter). Wall motion was normal; there were no  regional wall motion abnormalities.  - Aortic valve: There was no stenosis.  - Mitral valve: Status post mitral valve repair. There was no  evidence for stenosis. There was no significant regurgitation.  Pressure half-time: 97 ms. Mean gradient (D): 4 mm Hg.  - Left atrium: The atrium was mildly dilated.  - Right ventricle: The cavity size was normal. Systolic function  was normal.  - Right atrium: The atrium was mildly dilated.  - Pulmonary arteries: No complete TR doppler jet so unable to  estimate PA systolic pressure.  - Inferior vena cava: The vessel was normal in size. The  respirophasic diameter changes were in the normal range (>= 50%),  consistent with normal central venous pressure.    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 12/2014 s/p RFCA. No date: Breast cyst No date: History of cardiac cath     Comment:  a. 03/2014 Cath [facility]): Nl cors. No date: Mitral valve disorder     Comment:  a. 03/2014 s/p MV repair [facility]); b. 06/2016 Echo: EF               55-60%, no rwma, Nl fxn'ing MV w/o stenosis. Mildly dil               LA/RA. No date: PAF (paroxysmal atrial fibrillation) ([place])     Comment:  a. 06/2016 s/p DCCV; b.  CHA2DS2VASc = 4-->Eliquis.               Rhythm mgmt w/ amio (prev intol to flecainide). No date: PSVT (paroxysmal supraventricular tachycardia) ([place]) No date: Stroke [facility])     Comment:  a. 10/5407 embolic L MCA infarct in setting of PAF.   Reproductive/Obstetrics                             Anesthesia Physical Anesthesia Plan  ASA: III  Anesthesia Plan: General   Post-op Pain Management:  Induction: Intravenous  PONV Risk Score and Plan: 2 and Propofol infusion  Airway Management Planned: Natural Airway  Additional Equipment:   Intra-op Plan:   Post-operative Plan:   Informed Consent: I have reviewed the patients History and Physical, chart, labs and discussed the procedure including the risks, benefits and alternatives for the proposed anesthesia with the patient or authorized representative who has indicated his/her understanding and acceptance.     Dental advisory given  Plan Discussed with: CRNA and Anesthesiologist  Anesthesia Plan Comments:         Anesthesia Quick Evaluation

## 2020-05-15 NOTE — Interval H&P Note (Signed)
History and Physical Interval Note:  05/15/2020 9:19 AM  Colleen Williamson  has presented today for surgery, with the diagnosis of PRS HX COLON POLYPS.  The various methods of treatment have been discussed with the patient and family. After consideration of risks, benefits and other options for treatment, the patient has consented to  Procedure(s): COLONOSCOPY WITH PROPOFOL (N/A) as a surgical intervention.  The patient's history has been reviewed, patient examined, no change in status, stable for surgery.  I have reviewed the patient's chart and labs.  Questions were answered to the patient's satisfaction.     [place], [place]

## 2020-05-15 NOTE — Transfer of Care (Signed)
Immediate Anesthesia Transfer of Care Note  Patient: Colleen Williamson  Procedure(s) Performed: COLONOSCOPY WITH PROPOFOL (N/A )  Patient Location: PACU and Endoscopy Unit  Anesthesia Type:General  Level of Consciousness: sedated  Airway & Oxygen Therapy: Patient Spontanous Breathing and Patient connected to nasal cannula oxygen  Post-op Assessment: Report given to RN and Post -op Vital signs reviewed and stable  Post vital signs: Reviewed and stable  Last Vitals:  Vitals Value Taken Time  BP    Temp    Pulse 42 05/15/20 0938  Resp 11 05/15/20 0938  SpO2 100 % 05/15/20 0938  Vitals shown include unvalidated device data.  Last Pain:  Vitals:   05/15/20 0806  TempSrc: Tympanic  PainSc: 0-No pain         Complications: No complications documented.

## 2020-05-17 ENCOUNTER — Encounter: Payer: Self-pay | Admitting: Internal Medicine

## 2020-05-23 ENCOUNTER — Ambulatory Visit: Admit: 2020-05-23 | Payer: PPO | Admitting: Internal Medicine

## 2020-05-23 SURGERY — COLONOSCOPY
Anesthesia: General

## 2020-08-28 DIAGNOSIS — I10 Essential (primary) hypertension: Secondary | ICD-10-CM | POA: Diagnosis not present

## 2020-08-28 DIAGNOSIS — Z79899 Other long term (current) drug therapy: Secondary | ICD-10-CM | POA: Diagnosis not present

## 2020-08-28 DIAGNOSIS — E7800 Pure hypercholesterolemia, unspecified: Secondary | ICD-10-CM | POA: Diagnosis not present

## 2020-08-28 DIAGNOSIS — I480 Paroxysmal atrial fibrillation: Secondary | ICD-10-CM | POA: Diagnosis not present

## 2020-09-04 ENCOUNTER — Other Ambulatory Visit: Payer: Self-pay | Admitting: Internal Medicine

## 2020-09-04 DIAGNOSIS — Z0000 Encounter for general adult medical examination without abnormal findings: Secondary | ICD-10-CM | POA: Diagnosis not present

## 2020-09-04 DIAGNOSIS — I10 Essential (primary) hypertension: Secondary | ICD-10-CM | POA: Diagnosis not present

## 2020-09-04 DIAGNOSIS — F419 Anxiety disorder, unspecified: Secondary | ICD-10-CM | POA: Diagnosis not present

## 2020-09-04 DIAGNOSIS — Z1231 Encounter for screening mammogram for malignant neoplasm of breast: Secondary | ICD-10-CM

## 2020-09-04 DIAGNOSIS — Z79899 Other long term (current) drug therapy: Secondary | ICD-10-CM | POA: Diagnosis not present

## 2020-09-04 DIAGNOSIS — I480 Paroxysmal atrial fibrillation: Secondary | ICD-10-CM | POA: Diagnosis not present

## 2020-09-04 DIAGNOSIS — I341 Nonrheumatic mitral (valve) prolapse: Secondary | ICD-10-CM | POA: Diagnosis not present

## 2020-09-04 DIAGNOSIS — E7800 Pure hypercholesterolemia, unspecified: Secondary | ICD-10-CM | POA: Diagnosis not present

## 2020-11-18 ENCOUNTER — Other Ambulatory Visit: Payer: Self-pay

## 2020-11-18 ENCOUNTER — Ambulatory Visit: Payer: PPO | Admitting: Dermatology

## 2020-11-18 DIAGNOSIS — I8310 Varicose veins of unspecified lower extremity with inflammation: Secondary | ICD-10-CM | POA: Diagnosis not present

## 2020-11-18 DIAGNOSIS — L814 Other melanin hyperpigmentation: Secondary | ICD-10-CM | POA: Diagnosis not present

## 2020-11-18 DIAGNOSIS — L578 Other skin changes due to chronic exposure to nonionizing radiation: Secondary | ICD-10-CM

## 2020-11-18 DIAGNOSIS — L821 Other seborrheic keratosis: Secondary | ICD-10-CM | POA: Diagnosis not present

## 2020-11-18 DIAGNOSIS — D229 Melanocytic nevi, unspecified: Secondary | ICD-10-CM

## 2020-11-18 DIAGNOSIS — L719 Rosacea, unspecified: Secondary | ICD-10-CM

## 2020-11-18 DIAGNOSIS — Z1283 Encounter for screening for malignant neoplasm of skin: Secondary | ICD-10-CM

## 2020-11-18 DIAGNOSIS — D1800 Hemangioma unspecified site: Secondary | ICD-10-CM | POA: Diagnosis not present

## 2020-11-18 NOTE — Patient Instructions (Signed)

## 2020-11-18 NOTE — Progress Notes (Signed)
   New Patient Visit  Subjective  Colleen Williamson is a 76 y.o. female who presents for the following: Annual Exam (No hx of skin cancers or dysplastic nevi ). The patient presents for Total-Body Skin Exam (TBSE) for skin cancer screening and mole check.  The following portions of the chart were reviewed this encounter and updated as appropriate:   Tobacco  Allergies  Meds  Problems  Med Hx  Surg Hx  Fam Hx     Review of Systems:  No other skin or systemic complaints except as noted in HPI or Assessment and Plan.  Objective  Well appearing patient in no apparent distress; mood and affect are within normal limits.  A full examination was performed including scalp, head, eyes, ears, nose, lips, neck, chest, axillae, abdomen, back, buttocks, bilateral upper extremities, bilateral lower extremities, hands, feet, fingers, toes, fingernails, and toenails. All findings within normal limits unless otherwise noted below.  Objective  Face: Erythema of the cheeks, chin, and nose.  Assessment & Plan  Rosacea Face Rosacea is a chronic progressive skin condition usually affecting the face of adults, causing redness and/or acne bumps. It is treatable but not curable. It sometimes affects the eyes (ocular rosacea) as well. It may respond to topical and/or systemic medication and can flare with stress, sun exposure, alcohol, exercise and some foods.  Daily application of broad spectrum spf 30+ sunscreen to face is recommended to reduce flares.  Mild - no treatment at this time.  Skin cancer screening   Lentigines - Scattered tan macules - Due to sun exposure - Benign-appering, observe - Recommend daily broad spectrum sunscreen SPF 30+ to sun-exposed areas, reapply every 2 hours as needed. - Call for any changes  Seborrheic Keratoses - Stuck-on, waxy, tan-brown papules and/or plaques  - Benign-appearing - Discussed benign etiology and prognosis. - Observe - Call for any  changes  Melanocytic Nevi - Tan-brown and/or pink-flesh-colored symmetric macules and papules - Benign appearing on exam today - Observation - Call clinic for new or changing moles - Recommend daily use of broad spectrum spf 30+ sunscreen to sun-exposed areas.   Hemangiomas - Red papules - Discussed benign nature - Observe - Call for any changes  Actinic Damage - Chronic condition, secondary to cumulative UV/sun exposure - diffuse scaly erythematous macules with underlying dyspigmentation - Recommend daily broad spectrum sunscreen SPF 30+ to sun-exposed areas, reapply every 2 hours as needed.  - Staying in the shade or wearing long sleeves, sun glasses (UVA+UVB protection) and wide brim hats (4-inch brim around the entire circumference of the hat) are also recommended for sun protection.  - Call for new or changing lesions.  Spider Veins  - Dilated blue, purple or red veins at the lower extremities - Reassured - These can be treated by sclerotherapy (a procedure to inject a medicine into the veins to make them disappear) if desired, but the treatment is not covered by insurance  Skin cancer screening performed today.  Return if symptoms worsen or fail to improve.  Luther Redo, CMA, am acting as scribe for Sarina Ser, MD .  Documentation: I have reviewed the above documentation for accuracy and completeness, and I agree with the above.  Sarina Ser, MD

## 2020-11-20 ENCOUNTER — Encounter: Payer: Self-pay | Admitting: Dermatology

## 2020-12-11 DIAGNOSIS — Z79899 Other long term (current) drug therapy: Secondary | ICD-10-CM | POA: Diagnosis not present

## 2020-12-11 DIAGNOSIS — E7800 Pure hypercholesterolemia, unspecified: Secondary | ICD-10-CM | POA: Diagnosis not present

## 2021-04-22 NOTE — Progress Notes (Signed)
Cardiology Office Note  Date:  04/23/2021   ID:  Colleen Williamson, Colleen Williamson [birth year], MRN [id number]  PCP:  Idelle Crouch, MD   Chief Complaint  Patient presents with   12 month follow up     "Doing well." Medications reviewed by the patient verbally.      HPI:  Colleen Williamson is a very pleasant 75 year old woman with history of  HTN mitral valve prolapse, moderate to severe regurgitation, repair 2015 at [facility],  postoperative atrial fibrillation,  stroke,  atrial flutter  underwent DCCV , anti-coagulation with Eliquis,  recurrent atrial flutter while on flecainide  second cardioversion 4/16,  catheter ablation in [facility]  On amiodarone, eliquis Cardiac cath 2015,  Carotid 1- 39 percent stenosis involving the RICA and the left internal carotid artery. EF 55 in 06/2016 Chronic SOB She presents for routine follow-up of her atrial flutter/atrial fibrillation  LOV 03/2020 In follow-up she reports that she feels well, no complaints Denies any tachypalpitations concerning for arrhythmia Tolerating low-dose amiodarone, and carvedilol  Active  Prior records reviewed,  had cardiac catheterization done September 30 , 2015 prior to mitral valve surgery at outside facility -[facility] Notes from cardiology at that time, normal LV function, no coronary calcification, moderate to severe mitral valve regurgitation with normal pulmonary pressures  Discussed family history, no significant coronary disease Parent passed, father 5 yo Mother MVA Sister died colon cancer  EKG personally reviewed by myself on todays visit Shows sinus bradycardia rate 53 bpm  no significant ST or T wave changes  Other past medical history reviewed  epsiode of atrial fibrillation 07/11/2016, Admission to the hospital Cardioversion To restore normal sinus rhythm Echocardiogram reviewed showing normal LV function, intact mitral valve  Reported having side effects on flecainide in the past, funny taste,  hair loss Denies any further episodes of atrial fibrillation   PMH:   has a past medical history of Abnormal mammogram, unspecified (2014), Anxiety, Atypical atrial flutter ([place]), Breast cyst, History of cardiac cath, Mitral valve disorder, PAF (paroxysmal atrial fibrillation) ([place]), PSVT (paroxysmal supraventricular tachycardia) ([place]), and Stroke ([place]).  PSH:    Past Surgical History:  Procedure Laterality Date   ABDOMINAL HYSTERECTOMY  2004   BREAST CYST ASPIRATION  15 YRS AGO   CARDIOVERSION N/A 09/21/2014   Procedure: CARDIOVERSION;  Surgeon: Josue Hector, MD;  Location: [facility];  Service: Cardiovascular;  Laterality: N/A;   COLONOSCOPY WITH PROPOFOL N/A 05/15/2020   Procedure: COLONOSCOPY WITH PROPOFOL;  Surgeon: Toledo, Benay Pike, MD;  Location: [facility];  Service: Endoscopy;  Laterality: N/A;   ELECTROPHYSIOLOGIC STUDY N/A 11/28/2014   Procedure: Cardioversion;  Surgeon: Teodoro Spray, MD;  Location: [place] CV [facility];  Service: Cardiovascular;  Laterality: N/A;   ELECTROPHYSIOLOGIC STUDY N/A 12/27/2014   Procedure: A-Flutter;  Surgeon: Evans Lance, MD;  Location: [place] [facility];  Service: Cardiovascular;  Laterality: N/A;   ELECTROPHYSIOLOGIC STUDY N/A 11/28/2014   Procedure: Cardioversion;  Surgeon: Teodoro Spray, MD;  Location: [facility];  Service: Cardiovascular;  Laterality: N/A;  Have discussed with Dr. Marcello Moores   ELECTROPHYSIOLOGIC STUDY N/A 07/13/2016   Procedure: Cardioversion;  Surgeon: Wellington Hampshire, MD;  Location: [facility] OR[facility];  Service: Cardiovascular;  Laterality: N/A;   EYE SURGERY  2007   MITRAL VALVE REPAIR  05/2014   RADIOLOGY WITH ANESTHESIA N/A 07/03/2014   Procedure: RADIOLOGY WITH ANESTHESIA;  Surgeon: Rob Hickman, MD;  Location: [place];  Service: Radiology;  Laterality: N/A;  Current Outpatient Medications  Medication Sig Dispense Refill   amiodarone (PACERONE) 200 MG tablet Take 1 tablet (200 mg total) by mouth daily. 90 tablet 3    apixaban (ELIQUIS) 5 MG TABS tablet Take 1 tablet (5 mg total) by mouth 2 (two) times daily. 180 tablet 3   carvedilol (COREG) 3.125 MG tablet Take 1 tablet (3.125 mg total) by mouth daily. 90 tablet 3   cholecalciferol (VITAMIN D) 400 units TABS tablet Take 400 Units by mouth daily.     escitalopram (LEXAPRO) 10 MG tablet Take 10 mg by mouth daily.     ipratropium (ATROVENT) 0.03 % nasal spray Place into the nose.     magnesium oxide (MAG-OX) 400 (241.3 MG) MG tablet Take 1 tablet (400 mg total) by mouth 2 (two) times daily. 60 tablet 5   meclizine (ANTIVERT) 25 MG tablet Take 25 mg by mouth 3 (three) times daily. (Patient not taking: Reported on 04/23/2021)     No current facility-administered medications for this visit.    Allergies:   Flecainide and Sulfa antibiotics   Social History:  The patient  reports that she has quit smoking. She has never used smokeless tobacco. She reports that she does not drink alcohol and does not use drugs.   Family History:   family history includes Breast cancer in her maternal aunt; Cancer in her father and another family member; Diabetes in an other family member.    Review of Systems: Review of Systems  Constitutional: Negative.   Respiratory: Negative.    Cardiovascular: Negative.   Gastrointestinal: Negative.   Musculoskeletal: Negative.   Neurological: Negative.   Psychiatric/Behavioral: Negative.    All other systems reviewed and are negative.  PHYSICAL EXAM: VS:  BP 130/80 (BP Location: Left Arm, Patient Position: Sitting, Cuff Size: Normal)   Pulse (!) 53   Ht 5\' 8"  (1.727 m)   Wt 161 lb 8 oz (73.3 kg)   SpO2 95%   BMI 24.56 kg/m  , BMI Body mass index is 24.56 kg/m.  Constitutional:  oriented to person, place, and time. No distress.  HENT:  Head: Grossly normal Eyes:  no discharge. No scleral icterus.  Neck: No JVD, no carotid bruits  Cardiovascular: Regular rate and rhythm, no murmurs appreciated Pulmonary/Chest: Clear to  auscultation bilaterally, no wheezes or rails Abdominal: Soft.  no distension.  no tenderness.  Musculoskeletal: Normal range of motion Neurological:  normal muscle tone. Coordination normal. No atrophy Skin: Skin warm and dry Psychiatric: normal affect, pleasant   Recent Labs: No results found for requested labs within last 8760 hours.    Lipid Panel Lab Results  Component Value Date   CHOL 132 07/04/2014   HDL 46 07/04/2014   LDLCALC 55 07/04/2014   TRIG 153 (H) 07/04/2014      Wt Readings from Last 3 Encounters:  04/23/21 161 lb 8 oz (73.3 kg)  05/15/20 164 lb (74.4 kg)  04/23/20 164 lb (74.4 kg)       ASSESSMENT AND PLAN:  Atypical atrial flutter ([place]) - Plan: EKG 12-Lead Denies any recurrent arrhythmia, no change to her medications Continue low-dose amiodarone, carvedilol  Shortness of breath  catheterization from April 25, 2014 showing no coronary disease Records reviewed No further work-up at this time  Essential hypertension - Plan: EKG 12-Lead Prior history of orthostasis No recurrent symptoms No changes to her medications  Paroxysmal atrial fibrillation ([place]) - Plan: EKG 12-Lead No recurrent symptoms  Cerebrovascular accident (CVA) due to embolism of  precerebral artery (HCC) - Plan: EKG 12-Lead Tolerating anticoagulation, eliquis 5 mg twice a day Stable  S/P MVR (mitral valve repair) - Plan: EKG 12-Lead  well-functioning mitral valve on prior echocardiogram No significant murmur on exam,  Consider repeat echocardiogram and follow-up if clinically indicated   Total encounter time more than 25 minutes  Greater than 50% was spent in counseling and coordination of care with the patient   Orders Placed This Encounter  Procedures   EKG 12-Lead      Signed, Esmond Plants, M.D., Ph.D. 04/23/2021  [place], [place]

## 2021-04-23 ENCOUNTER — Other Ambulatory Visit: Payer: Self-pay

## 2021-04-23 ENCOUNTER — Encounter: Payer: Self-pay | Admitting: Cardiovascular Disease

## 2021-04-23 ENCOUNTER — Ambulatory Visit: Payer: PPO | Admitting: Cardiovascular Disease

## 2021-04-23 VITALS — BP 130/80 | HR 53 | Ht 68.0 in | Wt 161.5 lb

## 2021-04-23 DIAGNOSIS — I10 Essential (primary) hypertension: Secondary | ICD-10-CM

## 2021-04-23 DIAGNOSIS — Z9889 Other specified postprocedural states: Secondary | ICD-10-CM | POA: Diagnosis not present

## 2021-04-23 DIAGNOSIS — I6310 Cerebral infarction due to embolism of unspecified precerebral artery: Secondary | ICD-10-CM

## 2021-04-23 DIAGNOSIS — I471 Supraventricular tachycardia: Secondary | ICD-10-CM | POA: Diagnosis not present

## 2021-04-23 DIAGNOSIS — I480 Paroxysmal atrial fibrillation: Secondary | ICD-10-CM | POA: Diagnosis not present

## 2021-04-23 DIAGNOSIS — E782 Mixed hyperlipidemia: Secondary | ICD-10-CM

## 2021-04-23 MED ORDER — CARVEDILOL 3.125 MG PO TABS: 3.1250 mg | ORAL_TABLET | Freq: Every day | ORAL | 3 refills | Status: DC

## 2021-04-23 MED ORDER — AMIODARONE HCL 200 MG PO TABS: 200.0000 mg | ORAL_TABLET | Freq: Every day | ORAL | 3 refills | Status: DC

## 2021-04-23 NOTE — Patient Instructions (Addendum)
Medication Instructions:  No changes  If you need a refill on your cardiac medications before your next appointment, please call your pharmacy.   Lab work: No new labs needed  Testing/Procedures: No new testing needed  Follow-Up: At [facility], you and your health needs are our priority.  As part of our continuing mission to provide you with exceptional heart care, we have created designated Provider Care Teams.  These Care Teams include your primary Cardiologist (physician) and Advanced Practice Providers (APPs -  Physician Assistants and Nurse Practitioners) who all work together to provide you with the care you need, when you need it.  You will need a follow up appointment in 12 months  Providers on your designated Care Team:   Christopher Berge, NP Ryan Dunn, PA-C Jacquelyn Visser, PA-C Cadence Furth, PA-C  COVID-19 Vaccine Information can be found at: [email or web address] For questions related to vaccine distribution or appointments, please email [email or web address] or call [phone].    

## 2021-04-27 ENCOUNTER — Other Ambulatory Visit: Payer: Self-pay | Admitting: Cardiovascular Disease

## 2021-04-28 NOTE — Telephone Encounter (Signed)
Prescription refill request for Eliquis received. Indication: PAF Last office visit: 04/23/21  Johnny Bridge MD Scr: 0.9 on 12/11/20 Age:  75 Weight: 73.3kg  Based on above findings Eliquis 5mg  twice daily is the appropriate dose.  Refill approved.

## 2021-04-28 NOTE — Telephone Encounter (Signed)
Prescription refill request for Eliquis received.  Indication: afib  Last office visit: 04/23/2021, [place] Scr: 0.7, 12/11/2020 Age: 75 yo  Weight: 73.3 kg   Refill sent.

## 2021-04-28 NOTE — Telephone Encounter (Signed)
Refill Request.  

## 2021-09-03 DIAGNOSIS — R8290 Unspecified abnormal findings in urine: Secondary | ICD-10-CM | POA: Diagnosis not present

## 2021-09-03 DIAGNOSIS — E7800 Pure hypercholesterolemia, unspecified: Secondary | ICD-10-CM | POA: Diagnosis not present

## 2021-09-03 DIAGNOSIS — Z79899 Other long term (current) drug therapy: Secondary | ICD-10-CM | POA: Diagnosis not present

## 2021-09-10 DIAGNOSIS — Z79899 Other long term (current) drug therapy: Secondary | ICD-10-CM | POA: Diagnosis not present

## 2021-09-10 DIAGNOSIS — E7800 Pure hypercholesterolemia, unspecified: Secondary | ICD-10-CM | POA: Diagnosis not present

## 2021-09-10 DIAGNOSIS — Z1231 Encounter for screening mammogram for malignant neoplasm of breast: Secondary | ICD-10-CM | POA: Diagnosis not present

## 2021-09-10 DIAGNOSIS — F419 Anxiety disorder, unspecified: Secondary | ICD-10-CM | POA: Diagnosis not present

## 2021-09-10 DIAGNOSIS — R002 Palpitations: Secondary | ICD-10-CM | POA: Diagnosis not present

## 2021-09-10 DIAGNOSIS — Z0000 Encounter for general adult medical examination without abnormal findings: Secondary | ICD-10-CM | POA: Diagnosis not present

## 2021-09-10 DIAGNOSIS — I480 Paroxysmal atrial fibrillation: Secondary | ICD-10-CM | POA: Diagnosis not present

## 2021-09-10 DIAGNOSIS — I10 Essential (primary) hypertension: Secondary | ICD-10-CM | POA: Diagnosis not present

## 2021-09-12 ENCOUNTER — Other Ambulatory Visit: Payer: Self-pay | Admitting: Internal Medicine

## 2021-09-12 DIAGNOSIS — Z1231 Encounter for screening mammogram for malignant neoplasm of breast: Secondary | ICD-10-CM

## 2021-10-22 ENCOUNTER — Ambulatory Visit
Admission: RE | Admit: 2021-10-22 | Discharge: 2021-10-22 | Disposition: A | Discharge status: Home / Self Care | Payer: PPO | Source: Ambulatory Visit | Attending: Internal Medicine | Admitting: Internal Medicine

## 2021-10-22 ENCOUNTER — Other Ambulatory Visit: Payer: Self-pay

## 2021-10-22 DIAGNOSIS — Z1231 Encounter for screening mammogram for malignant neoplasm of breast: Secondary | ICD-10-CM | POA: Insufficient documentation

## 2021-12-01 ENCOUNTER — Telehealth: Payer: Self-pay | Admitting: Cardiovascular Disease

## 2021-12-01 MED ORDER — APIXABAN 5 MG PO TABS
5.0000 mg | ORAL_TABLET | Freq: Two times a day (BID) | ORAL | 1 refills | Status: AC
Start: 2021-12-01 — End: ?

## 2021-12-01 NOTE — Telephone Encounter (Signed)
?*  STAT* If patient is at the pharmacy, call can be transferred to refill team. ? ? ?1. Which medications need to be refilled? (please list name of each medication and dose if known) apixaban (ELIQUIS) 5 MG TABS tablet ? ?2. Which pharmacy/location (including street and city if local pharmacy) is medication to be sent to? [id number] [place] ?3. Do they need a 30 day or 90 day supply? 90  ?

## 2021-12-01 NOTE — Telephone Encounter (Signed)
Prescription refill request for Eliquis received. ?Indication: PAF ?Last office visit: 04/23/21  Johnny Bridge MD ?Scr: 0.9 on 09/03/21 ?Age: 76 ?Weight: 73.3kg ? ?Based on above findings Eliquis '5mg'$  twice daily is the appropriate dose.  Refill approved. ? ?

## 2021-12-01 NOTE — Telephone Encounter (Signed)
Please review

## 2021-12-10 ENCOUNTER — Ambulatory Visit: Payer: PPO | Admitting: Dermatology

## 2021-12-10 DIAGNOSIS — E7800 Pure hypercholesterolemia, unspecified: Secondary | ICD-10-CM | POA: Diagnosis not present

## 2021-12-10 DIAGNOSIS — Z79899 Other long term (current) drug therapy: Secondary | ICD-10-CM | POA: Diagnosis not present

## 2022-04-06 ENCOUNTER — Telehealth: Payer: Self-pay | Admitting: Cardiovascular Disease

## 2022-04-06 DIAGNOSIS — Z79899 Other long term (current) drug therapy: Secondary | ICD-10-CM

## 2022-04-06 DIAGNOSIS — E782 Mixed hyperlipidemia: Secondary | ICD-10-CM

## 2022-04-06 DIAGNOSIS — I480 Paroxysmal atrial fibrillation: Secondary | ICD-10-CM

## 2022-04-06 NOTE — Telephone Encounter (Signed)
I called and spoke with the patient. I advised her Dr. Rockey Situ did not have any pending labs for her at this time.  Per the patient, Dr. Doy Hutching started her on a rosuvastatin 10 mg once daily and she cannot get a hold of him to recheck her labs.   She is due to see Dr. Rockey Situ in December and would like to see if he would mind checking her lab work.  I advised the patient I will if he is ok with [place] ordering: - Lipid - CMET (Eliquis/ Amiodarone) - CBC (Eliquis) - TSH (Amodarone)  She is aware we will call her back with his response and where to go for her labs.  The patient voices understanding and was appreciative of the call back.

## 2022-04-06 NOTE — Telephone Encounter (Signed)
Patient is calling requesting lab orders be placed for her to have performed prior to her upcoming follow up in December. She is requesting a callback to notify her once orders have been placed. Please advise.

## 2022-04-08 ENCOUNTER — Encounter: Payer: Self-pay | Admitting: *Deleted

## 2022-04-08 NOTE — Telephone Encounter (Signed)
Minna Merritts, MD  Sent: Tue April 07, 2022  3:02 PM  To: Emily Filbert, RN          Message  That is fine thx  TG

## 2022-04-08 NOTE — Telephone Encounter (Signed)
I spoke with the patient and advised her that Dr. Rockey Situ will place lab orders for her to have done prior to her follow up in December.   She is aware that we are ordering: Lipid/ CMET/ TSH/ CBC  -Medical Mall Entrance at [facility] 1st desk on the right to check in (REGISTRATION)  Lab hours: Monday- Friday (7:30 am- 5:30 pm)   I have advised the patient to wait until a little closer to her follow up to have her labs done, but I am mailing her a reminder as she does not use MyChart.  The patient voices understanding of the above and is agreeable.

## 2022-05-25 ENCOUNTER — Telehealth: Payer: Self-pay | Admitting: Cardiovascular Disease

## 2022-05-25 NOTE — Telephone Encounter (Signed)
Spoke with pt who wanted to know if she could have her labs done now rather than waiting as she states that she is tired all of the time. Advised that labs have been ordered and that they need to be fasting.    She is aware that we are ordering: Lipid/ CMET/ TSH/ CBC   -Medical Mall Entrance at [facility] 1st desk on the right to check in (REGISTRATION)  Lab hours: Monday- Friday (7:30 am- 5:30 pm)   The patient voices understanding of the above and is agreeable.

## 2022-05-25 NOTE — Telephone Encounter (Signed)
Patient called and said that she would like a complete blood work check done

## 2022-05-26 DIAGNOSIS — Z961 Presence of intraocular lens: Secondary | ICD-10-CM | POA: Diagnosis not present

## 2022-05-26 DIAGNOSIS — H35373 Puckering of macula, bilateral: Secondary | ICD-10-CM | POA: Diagnosis not present

## 2022-05-27 ENCOUNTER — Other Ambulatory Visit
Admission: RE | Admit: 2022-05-27 | Discharge: 2022-05-27 | Disposition: A | Discharge status: Home / Self Care | Payer: PPO | Attending: Cardiovascular Disease | Admitting: Cardiovascular Disease

## 2022-05-27 DIAGNOSIS — E782 Mixed hyperlipidemia: Secondary | ICD-10-CM | POA: Diagnosis not present

## 2022-05-27 DIAGNOSIS — I480 Paroxysmal atrial fibrillation: Secondary | ICD-10-CM | POA: Insufficient documentation

## 2022-05-27 DIAGNOSIS — Z79899 Other long term (current) drug therapy: Secondary | ICD-10-CM | POA: Diagnosis not present

## 2022-05-27 LAB — COMPREHENSIVE METABOLIC PANEL
ALT: 41 U/L (ref 0–44)
AST: 49 U/L — ABNORMAL HIGH (ref 15–41)
Albumin: 3.9 g/dL (ref 3.5–5.0)
Alkaline Phosphatase: 93 U/L (ref 38–126)
Anion gap: 5 (ref 5–15)
BUN: 19 mg/dL (ref 8–23)
CO2: 28 mmol/L (ref 22–32)
Calcium: 9.3 mg/dL (ref 8.9–10.3)
Chloride: 109 mmol/L (ref 98–111)
Creatinine, Ser: 0.83 mg/dL (ref 0.44–1.00)
GFR, Estimated: >60 mL/min (ref >60)
Glucose, Bld: 96 mg/dL (ref 70–99)
Potassium: 4 mmol/L (ref 3.5–5.1)
Sodium: 142 mmol/L (ref 135–145)
Total Bilirubin: 1.5 mg/dL — ABNORMAL HIGH (ref 0.3–1.2)
Total Protein: 6.9 g/dL (ref 6.5–8.1)

## 2022-05-27 LAB — CBC
HCT: 46.7 % — ABNORMAL HIGH (ref 36.0–46.0)
Hemoglobin: 15 g/dL (ref 12.0–15.0)
MCH: 28 pg (ref 26.0–34.0)
MCHC: 32.1 g/dL (ref 30.0–36.0)
MCV: 87.3 fL (ref 80.0–100.0)
Platelets: 208 10*3/uL (ref 150–400)
RBC: 5.35 MIL/uL — ABNORMAL HIGH (ref 3.87–5.11)
RDW: 14.7 % (ref 11.5–15.5)
WBC: 4.6 10*3/uL (ref 4.0–10.5)
nRBC: 0 % (ref 0.0–0.2)

## 2022-05-27 LAB — LIPID PANEL
Cholesterol: 170 mg/dL (ref 0–200)
HDL: 65 mg/dL (ref >40)
LDL Cholesterol: 93 mg/dL (ref 0–99)
Total CHOL/HDL Ratio: 2.6 RATIO
Triglycerides: 60 mg/dL (ref <150)
VLDL: 12 mg/dL (ref 0–40)

## 2022-05-27 LAB — TSH: TSH: 1.482 u[IU]/mL (ref 0.350–4.500)

## 2022-06-29 NOTE — Progress Notes (Unsigned)
Cardiology Office Note  Date:  06/30/2022   ID:  Colleen Williamson, Colleen Williamson 1945/11/03, MRN [id number]  PCP:  Idelle Crouch, MD   Chief Complaint  Patient presents with   12 month follow up     Patient c/o shortness of breath with little to no activity. Medications reviewed by the patient verbally.      HPI:  Colleen Williamson is a very pleasant 76 year old woman with history of  HTN mitral valve prolapse, moderate to severe regurgitation, repair 2015 at [facility],  postoperative atrial fibrillation,  stroke,  atrial flutter  underwent DCCV , on Eliquis,  recurrent atrial flutter while on flecainide  second cardioversion 4/16,  catheter ablation in [facility]  On amiodarone, eliquis Cardiac cath 2015,  Carotid 1- 39 percent stenosis involving the RICA and the left internal carotid artery. EF 55 in 06/2016 Chronic SOB She presents for routine follow-up of her atrial flutter/atrial fibrillation  LOV 03/2021 Reports recent [place] to see her [place] Got very SOB walking up a hill in the [place] her shortness of breath was out of proportion to her conditioning  Walks 30 minutes a day, medium pace  Rare dizzy spells when standing still for long periods of time such as in line  Does not wear compression hose Tolerating low-dose amiodarone, carvedilol once a day  EKG personally reviewed by myself on todays visit Normal sinus rhythm rate 58 bpm no significant ST-T wave changes, poor R wave progression to the anterior precordial leads  Prior records reviewed,  had cardiac catheterization done September 30 , 2015 prior to mitral valve surgery at outside facility -[facility] Notes from cardiology at that time, normal LV function, no coronary calcification, moderate to severe mitral valve regurgitation with normal pulmonary pressures  Discussed family history, no significant coronary disease Parent passed, father 69 yo Mother MVA Sister died colon cancer  epsiode of  atrial fibrillation 07/11/2016, Admission to the hospital Cardioversion To restore normal sinus rhythm Echocardiogram reviewed showing normal LV function, intact mitral valve  Reported having side effects on flecainide in the past, funny taste, hair loss Denies any further episodes of atrial fibrillation   PMH:   has a past medical history of Abnormal mammogram, unspecified (2014), Anxiety, Atypical atrial flutter ([place]), History of cardiac cath, Mitral valve disorder, PAF (paroxysmal atrial fibrillation) ([place]), PSVT (paroxysmal supraventricular tachycardia), and Stroke ([place]).  PSH:    Past Surgical History:  Procedure Laterality Date   ABDOMINAL HYSTERECTOMY  2004   BREAST CYST ASPIRATION  15 YRS AGO   CARDIOVERSION N/A 09/21/2014   Procedure: CARDIOVERSION;  Surgeon: Josue Hector, MD;  Location: [facility];  Service: Cardiovascular;  Laterality: N/A;   COLONOSCOPY WITH PROPOFOL N/A 05/15/2020   Procedure: COLONOSCOPY WITH PROPOFOL;  Surgeon: Toledo, Benay Pike, MD;  Location: [facility];  Service: Endoscopy;  Laterality: N/A;   ELECTROPHYSIOLOGIC STUDY N/A 11/28/2014   Procedure: Cardioversion;  Surgeon: Teodoro Spray, MD;  Location: [place] CV LAB;  Service: Cardiovascular;  Laterality: N/A;   ELECTROPHYSIOLOGIC STUDY N/A 12/27/2014   Procedure: A-Flutter;  Surgeon: Evans Lance, MD;  Location: [place] CV LAB;  Service: Cardiovascular;  Laterality: N/A;   ELECTROPHYSIOLOGIC STUDY N/A 11/28/2014   Procedure: Cardioversion;  Surgeon: Teodoro Spray, MD;  Location: [facility];  Service: Cardiovascular;  Laterality: N/A;  Have discussed with Dr. Marcello Moores   ELECTROPHYSIOLOGIC STUDY N/A 07/13/2016   Procedure: Cardioversion;  Surgeon: Wellington Hampshire, MD;  Location: [facility];  Service: Cardiovascular;  Laterality: N/A;   EYE SURGERY  2007   MITRAL VALVE REPAIR  05/2014   RADIOLOGY WITH ANESTHESIA N/A 07/03/2014   Procedure: RADIOLOGY WITH ANESTHESIA;  Surgeon: Rob Hickman,  MD;  Location: [place];  Service: Radiology;  Laterality: N/A;    Current Outpatient Medications  Medication Sig Dispense Refill   amiodarone (PACERONE) 200 MG tablet Take 1 tablet (200 mg total) by mouth daily. 90 tablet 3   apixaban (ELIQUIS) 5 MG TABS tablet Take 1 tablet (5 mg total) by mouth 2 (two) times daily. 180 tablet 1   carvedilol (COREG) 3.125 MG tablet Take 1 tablet (3.125 mg total) by mouth daily. 90 tablet 3   cholecalciferol (VITAMIN D) 400 units TABS tablet Take 400 Units by mouth daily.     escitalopram (LEXAPRO) 10 MG tablet Take 10 mg by mouth daily.     ipratropium (ATROVENT) 0.03 % nasal spray Place into the nose.     magnesium oxide (MAG-OX) 400 (241.3 MG) MG tablet Take 1 tablet (400 mg total) by mouth 2 (two) times daily. 60 tablet 5   meclizine (ANTIVERT) 25 MG tablet Take 25 mg by mouth 3 (three) times daily.     rosuvastatin (CRESTOR) 10 MG tablet Take 10 mg by mouth daily.     No current facility-administered medications for this visit.    Allergies:   Flecainide and Sulfa antibiotics   Social History:  The patient  reports that she has quit smoking. She has never used smokeless tobacco. She reports that she does not drink alcohol and does not use drugs.   Family History:   family history includes Breast cancer in her maternal aunt; Cancer in her father and another family member; Diabetes in an other family member.    Review of Systems: Review of Systems  Constitutional: Negative.   Respiratory:  Positive for shortness of breath.   Cardiovascular: Negative.   Gastrointestinal: Negative.   Musculoskeletal: Negative.   Neurological:  Positive for dizziness.  Psychiatric/Behavioral: Negative.    All other systems reviewed and are negative.   PHYSICAL EXAM: VS:  BP (!) 140/90 (BP Location: Left Arm, Patient Position: Sitting, Cuff Size: Normal)   Pulse (!) 58   Ht '5\' 8"'$  (1.727 m)   Wt 161 lb 4 oz (73.1 kg)   SpO2 96%   BMI 24.52 kg/m  , BMI Body  mass index is 24.52 kg/m. Constitutional:  oriented to person, place, and time. No distress.  HENT:  Head: Grossly normal Eyes:  no discharge. No scleral icterus.  Neck: No JVD, no carotid bruits  Cardiovascular: Regular rate and rhythm, no murmurs appreciated Pulmonary/Chest: Clear to auscultation bilaterally, no wheezes or rails Abdominal: Soft.  no distension.  no tenderness.  Musculoskeletal: Normal range of motion Neurological:  normal muscle tone. Coordination normal. No atrophy Skin: Skin warm and dry Psychiatric: normal affect, pleasant  Recent Labs: 05/27/2022: ALT 41; BUN 19; Creatinine, Ser 0.83; Hemoglobin 15.0; Platelets 208; Potassium 4.0; Sodium 142; TSH 1.482    Lipid Panel Lab Results  Component Value Date   CHOL 170 05/27/2022   HDL 65 05/27/2022   LDLCALC 93 05/27/2022   TRIG 60 05/27/2022      Wt Readings from Last 3 Encounters:  06/30/22 161 lb 4 oz (73.1 kg)  04/23/21 161 lb 8 oz (73.3 kg)  05/15/20 164 lb (74.4 kg)       ASSESSMENT AND PLAN:  Atypical atrial flutter (HCC) No recurrent arrhythmia, continue  amiodarone and carvedilol once a day Presumably cut back from twice daily dosing or carvedilol for bradycardia  Shortness of breath  catheterization from April 25, 2014 showing no coronary disease Reports recent episodes walking [place] the hills with her family Clinical exam unchanged, EKG unchanged Unable to exclude pulmonary edema, recommend she try Lasix 20 mg with potassium 10 for 3 days then 2 or 3 days a week to see if this helps her symptoms  Essential hypertension - Plan: EKG 12-Lead Prior history of orthostasis Blood pressure stable today but having brief episodes of dizziness when standing in place for long period of time Recommended compression hose, movement  Paroxysmal atrial fibrillation ([place]) - Plan: EKG 12-Lead No recurrent symptoms  Cerebrovascular accident (CVA) due to embolism of precerebral artery ([place]) - Plan: EKG  12-Lead Tolerating eliquis 5 mg twice a day Stable, no new symptoms  S/P MVR (mitral valve repair) - Plan: EKG 12-Lead  well-functioning mitral valve on prior echocardiogram No significant murmur on exam,  Discussed repeating echocardiogram, she would like to hold off for now   Total encounter time more than 40 minutes  Greater than 50% was spent in counseling and coordination of care with the patient   No orders of the defined types were placed in this encounter.     Signed, Esmond Plants, M.D., Ph.D. 06/30/2022  [place], [place]

## 2022-06-30 ENCOUNTER — Encounter: Payer: Self-pay | Admitting: Cardiovascular Disease

## 2022-06-30 ENCOUNTER — Ambulatory Visit: Payer: PPO | Attending: Cardiovascular Disease | Admitting: Cardiovascular Disease

## 2022-06-30 VITALS — BP 140/90 | HR 58 | Ht 68.0 in | Wt 161.2 lb

## 2022-06-30 DIAGNOSIS — E782 Mixed hyperlipidemia: Secondary | ICD-10-CM | POA: Diagnosis not present

## 2022-06-30 DIAGNOSIS — I639 Cerebral infarction, unspecified: Secondary | ICD-10-CM

## 2022-06-30 DIAGNOSIS — I6310 Cerebral infarction due to embolism of unspecified precerebral artery: Secondary | ICD-10-CM | POA: Diagnosis not present

## 2022-06-30 DIAGNOSIS — I4892 Unspecified atrial flutter: Secondary | ICD-10-CM | POA: Diagnosis not present

## 2022-06-30 DIAGNOSIS — I471 Supraventricular tachycardia, unspecified: Secondary | ICD-10-CM | POA: Diagnosis not present

## 2022-06-30 DIAGNOSIS — I480 Paroxysmal atrial fibrillation: Secondary | ICD-10-CM | POA: Diagnosis not present

## 2022-06-30 DIAGNOSIS — Z9889 Other specified postprocedural states: Secondary | ICD-10-CM

## 2022-06-30 DIAGNOSIS — I10 Essential (primary) hypertension: Secondary | ICD-10-CM | POA: Diagnosis not present

## 2022-06-30 MED ORDER — FUROSEMIDE 20 MG PO TABS: 20.0000 mg | ORAL_TABLET | Freq: Every day | ORAL | 1 refills | Status: DC | PRN

## 2022-06-30 MED ORDER — POTASSIUM CHLORIDE ER 10 MEQ PO TBCR: EXTENDED_RELEASE_TABLET | ORAL | 1 refills | Status: DC

## 2022-06-30 NOTE — Patient Instructions (Addendum)
Please call if shortness of breath gets worse  Medication Instructions:  Lasix /furosemide 20 mg daily as needed With potassium 10 meq with lasix  If you need a refill on your cardiac medications before your next appointment, please call your pharmacy.   Lab work: No new labs needed  Testing/Procedures: No new testing needed  Follow-Up: At [facility], you and your health needs are our priority.  As part of our continuing mission to provide you with exceptional heart care, we have created designated Provider Care Teams.  These Care Teams include your primary Cardiologist (physician) and Advanced Practice Providers (APPs -  Physician Assistants and Nurse Practitioners) who all work together to provide you with the care you need, when you need it.  You will need a follow up appointment in 6 months  Providers on your designated Care Team:   Murray Hodgkins, NP Christell Faith, PA-C Cadence Kathlen Mody, [place]  COVID-19 Vaccine Information can be found at: [email or web address] For questions related to vaccine distribution or appointments, please email [email or web address] or call [phone].

## 2022-07-03 ENCOUNTER — Ambulatory Visit: Payer: PPO | Admitting: Podiatry

## 2022-07-03 DIAGNOSIS — M79674 Pain in right toe(s): Secondary | ICD-10-CM | POA: Diagnosis not present

## 2022-07-03 DIAGNOSIS — L600 Ingrowing nail: Secondary | ICD-10-CM

## 2022-07-03 DIAGNOSIS — B351 Tinea unguium: Secondary | ICD-10-CM

## 2022-07-03 DIAGNOSIS — M79675 Pain in left toe(s): Secondary | ICD-10-CM

## 2022-07-03 NOTE — Progress Notes (Signed)
Chief Complaint  Patient presents with   Ingrown Toenail    Patient is here for right foot ingrown toe nail.    Subjective: Patient presents today for evaluation of pain to the medial lateral border of the right great toe. Patient is concerned for possible ingrown nail.  It is very sensitive to touch.  Patient also is requesting to have her nails debrided today.  She says they are long and painful and symptomatic and she is unable to trim her own nails.  Finally the patient states that she developed a lesion to the medial aspect of the right foot and she only noticed it about 2 weeks ago.  It is becoming somewhat symptomatic and tender.  She would like to have it evaluated today.  Patient presents today for further treatment and evaluation.  Past Medical History:  Diagnosis Date   Abnormal mammogram, unspecified 2014   Anxiety    Atypical atrial flutter ([place])    a. 12/2014 s/p RFCA.   History of cardiac cath    a. 03/2014 Cath [facility]): Nl cors.   Mitral valve disorder    a. 03/2014 s/p MV repair [facility]); b. 06/2016 Echo: EF 55-60%, no rwma, Nl fxn'ing MV w/o stenosis. Mildly dil LA/RA.   PAF (paroxysmal atrial fibrillation) ([place])    a. 06/2016 s/p DCCV; b.  CHA2DS2VASc = 4-->Eliquis. Rhythm mgmt w/ amio (prev intol to flecainide).   PSVT (paroxysmal supraventricular tachycardia)    Stroke ([place])    a. 22/6333 embolic L MCA infarct in setting of PAF.   Past Surgical History:  Procedure Laterality Date   ABDOMINAL HYSTERECTOMY  2004   BREAST CYST ASPIRATION  15 YRS AGO   CARDIOVERSION N/A 09/21/2014   Procedure: CARDIOVERSION;  Surgeon: Josue Hector, MD;  Location: [place];  Service: Cardiovascular;  Laterality: N/A;   COLONOSCOPY WITH PROPOFOL N/A 05/15/2020   Procedure: COLONOSCOPY WITH PROPOFOL;  Surgeon: Toledo, Benay Pike, MD;  Location: [facility] ENDOSCOPY;  Service: Endoscopy;  Laterality: N/A;   ELECTROPHYSIOLOGIC STUDY N/A 11/28/2014   Procedure: Cardioversion;  Surgeon:  Teodoro Spray, MD;  Location: [place] CV LAB;  Service: Cardiovascular;  Laterality: N/A;   ELECTROPHYSIOLOGIC STUDY N/A 12/27/2014   Procedure: A-Flutter;  Surgeon: Amyia Lodwick Lance, MD;  Location: [place] CV LAB;  Service: Cardiovascular;  Laterality: N/A;   ELECTROPHYSIOLOGIC STUDY N/A 11/28/2014   Procedure: Cardioversion;  Surgeon: Teodoro Spray, MD;  Location: [facility] ORS;  Service: Cardiovascular;  Laterality: N/A;  Have discussed with Dr. Marcello Moores   ELECTROPHYSIOLOGIC STUDY N/A 07/13/2016   Procedure: Cardioversion;  Surgeon: Wellington Hampshire, MD;  Location: [facility] ORS;  Service: Cardiovascular;  Laterality: N/A;   EYE SURGERY  2007   MITRAL VALVE REPAIR  05/2014   RADIOLOGY WITH ANESTHESIA N/A 07/03/2014   Procedure: RADIOLOGY WITH ANESTHESIA;  Surgeon: Rob Hickman, MD;  Location: [place];  Service: Radiology;  Laterality: N/A;   Allergies  Allergen Reactions   Flecainide     Funny taste; hairloss   Sulfa Antibiotics Nausea Only    Objective:  General: Well developed, nourished, in no acute distress, alert and oriented x3   Dermatology: Skin is warm, dry and supple bilateral.  Medial and lateral border of the right great toenail is tender with evidence of an ingrowing nail. Pain on palpation noted to the border of the nail fold.  Hyperkeratotic elongated nails also noted to the bilateral feet with associated tenderness  Vascular: DP and PT pulses palpable.  No clinical evidence of vascular compromise  Neruologic: Grossly intact via light touch bilateral.  Musculoskeletal: No gross deformity noted.  There is a soft tissue mass along the medial aspect of the right forefoot which appears to be somewhat consistent with a superficial ganglion cyst although it is small and only measures approximately 0.5 cm in diameter.  None adhered  Assesement: #1 Paronychia with ingrowing nail medial and lateral border right great toe #2 pain due to onychomycosis of toenails both #3 benign  appearing soft tissue mass medial aspect of the right foot  Plan of Care:  1. Patient evaluated.  2. Discussed treatment alternatives and plan of care. Explained nail avulsion procedure and post procedure course to patient. 3. Patient opted for permanent partial nail avulsion of the ingrown portion of the nail.  4. Prior to procedure, local anesthesia infiltration utilized using 3 ml of a 50:50 mixture of 2% plain lidocaine and 0.5% plain marcaine in a normal hallux block fashion and a betadine prep performed.  5. Partial permanent nail avulsion with chemical matrixectomy performed using [id number] applications of phenol followed by alcohol flush.  6. Light dressing applied.  Post care instructions provided 7.  Mechanical debridement of nails 1-5 bilateral was also performed using a nail nipper without incident or bleeding 8.  In regards to the suspected ganglion cyst to the medial aspect of the right foot, pressure was applied to the area but it was not ruptured.  Decision was made to locally anesthetize the area and an attempt at draining the lesion.  The area was prepped aseptically and 1 mL of lidocaine plain was infiltrated into the area.  After this an 18-gauge needle was utilized to perforate the lesion however there was no drainage associated to the area.  Light dressing was applied 9.  Return to clinic in 3 weeks.  At this time we may need to consider taking the patient to the [facility] office procedure room for excision of the benign lesion  Edrick Kins, DPM [facility]  Dr. Edrick Kins, DPM    [address], [place] [address]                Office [phone]  Fax [phone]

## 2022-07-09 ENCOUNTER — Other Ambulatory Visit: Payer: Self-pay | Admitting: Podiatry

## 2022-07-09 ENCOUNTER — Telehealth: Payer: Self-pay | Admitting: *Deleted

## 2022-07-09 MED ORDER — CEPHALEXIN 500 MG PO CAPS: 500.0000 mg | ORAL_CAPSULE | Freq: Three times a day (TID) | ORAL | 0 refills | Status: DC

## 2022-07-09 NOTE — Telephone Encounter (Signed)
Patient has been updated on prescription sent and  recommendations thru voice message.

## 2022-07-09 NOTE — Telephone Encounter (Signed)
Patient is calling because her post procedural nail is still red, swollen and a little painful. She has only soaked the nail for one day, suggested that she resumes the instructions given for soaking the nail twice daily in epsom salts solutions, dry, apply the ointment and cover w/ bandage,to do this until there is no drainage.I will also send to physician for other recommendations

## 2022-07-09 NOTE — Telephone Encounter (Signed)
This is expected the first week especially with the phenol application. Just to be safe I'll send in keflex TID x 10 days now. - Dr. Amalia Hailey

## 2022-07-21 ENCOUNTER — Telehealth: Payer: Self-pay

## 2022-07-22 ENCOUNTER — Other Ambulatory Visit: Payer: Self-pay | Admitting: Podiatry

## 2022-07-22 DIAGNOSIS — R030 Elevated blood-pressure reading, without diagnosis of hypertension: Secondary | ICD-10-CM | POA: Diagnosis not present

## 2022-07-22 DIAGNOSIS — M79674 Pain in right toe(s): Secondary | ICD-10-CM | POA: Diagnosis not present

## 2022-07-22 DIAGNOSIS — Z6823 Body mass index (BMI) 23.0-23.9, adult: Secondary | ICD-10-CM | POA: Diagnosis not present

## 2022-07-22 MED ORDER — CEPHALEXIN 500 MG PO CAPS: 500.0000 mg | ORAL_CAPSULE | Freq: Three times a day (TID) | ORAL | 0 refills | Status: DC

## 2022-07-22 NOTE — Telephone Encounter (Signed)
Just tell the patient to continue soaking and applying antibiotic ointment and I will look at it on the 29th. I don't see a prescription for the doxycycline that was sent in, but I had sent in Keflex on 07/09/22. I went ahead and sent in a refill.  Thanks, Dr. Amalia Hailey

## 2022-07-24 ENCOUNTER — Ambulatory Visit: Payer: PPO | Admitting: Podiatry

## 2022-07-24 DIAGNOSIS — L600 Ingrowing nail: Secondary | ICD-10-CM

## 2022-07-24 MED ORDER — DOXYCYCLINE HYCLATE 100 MG PO TABS: 100.0000 mg | ORAL_TABLET | Freq: Two times a day (BID) | ORAL | 0 refills | Status: DC

## 2022-07-24 NOTE — Progress Notes (Signed)
   Chief Complaint  Patient presents with   Follow-up    Follow up on the right foot, patient stated she is not doing well, swelling even after taking antibiotics, Patient is currently taking doxy, is on  last dose today    Subjective: 76 y.o. female presents today status post permanent nail avulsion procedure of the medial and lateral aspect of the left great toe that was performed on 07/03/2022.  Patient states that she continues to have some tenderness and redness to the area.  Her PCP actually prescribed 1 week of doxycycline and she says there has been some improvement with the antibiotics.  She has been soaking her foot as instructed.  No new complaints at this time.   Past Medical History:  Diagnosis Date   Abnormal mammogram, unspecified 2014   Anxiety    Atypical atrial flutter ([place])    a. 12/2014 s/p RFCA.   History of cardiac cath    a. 03/2014 Cath [facility]): Nl cors.   Mitral valve disorder    a. 03/2014 s/p MV repair [facility]); b. 06/2016 Echo: EF 55-60%, no rwma, Nl fxn'ing MV w/o stenosis. Mildly dil LA/RA.   PAF (paroxysmal atrial fibrillation) ([place])    a. 06/2016 s/p DCCV; b.  CHA2DS2VASc = 4-->Eliquis. Rhythm mgmt w/ amio (prev intol to flecainide).   PSVT (paroxysmal supraventricular tachycardia)    Stroke ([place])    a. 62/7035 embolic L MCA infarct in setting of PAF.    Objective: Neurovascular status intact.  Skin is warm, dry and supple.  There is some callus tissue and debris to the medial and lateral aspect of the left hallux nail plate.  Mild localized erythema around the nail avulsion site  Assessment: #1 s/p partial permanent nail matrixectomy medial lateral border left great toe   Plan of care: #1 patient was evaluated  #2 light debridement of the periungual debris was performed to the border of the respective toe and nail plate using a tissue nipper. #3  Refill prescription for doxycycline 100 mg 2 times daily #14 #4 return to clinic in 1 week.  If there is no  significant improvement we may need to reanesthetize the toe to evaluate the sensitive portion of the nail avulsion site   Edrick Kins, DPM [facility]  Dr. Edrick Kins, DPM    [address], [place] [address]                Office [phone]  Fax [phone]

## 2022-07-29 ENCOUNTER — Ambulatory Visit
Admission: RE | Admit: 2022-07-29 | Discharge: 2022-07-29 | Disposition: A | Discharge status: Home / Self Care | Payer: PPO | Source: Ambulatory Visit | Attending: Physician Assistant | Admitting: Physician Assistant

## 2022-07-29 ENCOUNTER — Other Ambulatory Visit: Payer: Self-pay | Admitting: Physician Assistant

## 2022-07-29 DIAGNOSIS — M7989 Other specified soft tissue disorders: Secondary | ICD-10-CM | POA: Insufficient documentation

## 2022-07-29 DIAGNOSIS — L03031 Cellulitis of right toe: Secondary | ICD-10-CM | POA: Diagnosis not present

## 2022-07-29 DIAGNOSIS — R202 Paresthesia of skin: Secondary | ICD-10-CM | POA: Diagnosis not present

## 2022-08-03 ENCOUNTER — Ambulatory Visit (INDEPENDENT_AMBULATORY_CARE_PROVIDER_SITE_OTHER): Payer: PPO | Admitting: Podiatry

## 2022-08-03 VITALS — BP 169/92 | HR 58

## 2022-08-03 DIAGNOSIS — L600 Ingrowing nail: Secondary | ICD-10-CM | POA: Diagnosis not present

## 2022-08-03 NOTE — Progress Notes (Signed)
   Chief Complaint  Patient presents with   Nail Problem    Nail check, right hallux ingrown, patient states the toe is still sore TX: keflex (still taking)     Subjective: 77 y.o. female presents today status post permanent nail avulsion procedure of the medial and lateral aspect of the left great toe that was performed on 07/03/2022.  Patient states that she continues to have some pain and tenderness although she has been taking the antibiotics.  Presenting for follow-up treatment evaluation  Past Medical History:  Diagnosis Date   Abnormal mammogram, unspecified 2014   Anxiety    Atypical atrial flutter ([place])    a. 12/2014 s/p RFCA.   History of cardiac cath    a. 03/2014 Cath [facility]): Nl cors.   Mitral valve disorder    a. 03/2014 s/p MV repair [facility]); b. 06/2016 Echo: EF 55-60%, no rwma, Nl fxn'ing MV w/o stenosis. Mildly dil LA/RA.   PAF (paroxysmal atrial fibrillation) ([place])    a. 06/2016 s/p DCCV; b.  CHA2DS2VASc = 4-->Eliquis. Rhythm mgmt w/ amio (prev intol to flecainide).   PSVT (paroxysmal supraventricular tachycardia)    Stroke ([place])    a. 14/7829 embolic L MCA infarct in setting of PAF.    Objective: Neurovascular status intact.  Skin is warm, dry and supple.  There continues to be pain and tenderness with some callus tissue and debris to the lateral aspect of the left hallux nail plate.  Mild localized erythema around the nail avulsion site  Assessment: #1 s/p partial permanent nail matrixectomy medial lateral border right great toe #2 residual paronychia lateral border right great toe -Patient evaluated -Although the patient has been on oral antibiotics she continues to have tenderness to the lateral border of the right great toe.  Decision made to perform partial temporary nail avulsion of the offending border of the toenail plate.  The patient agrees.  The toe was prepped in aseptic manner and digital block performed using 3 mL of 2% lidocaine plain -The offending border  of the nail was avulsed in the standard manner and dressings applied.  Post care instructions provided -Return to clinic 2 weeks   Edrick Kins, DPM [facility]  Dr. Edrick Kins, DPM    [address], [place] [address]                Office [phone]  Fax [phone]

## 2022-08-19 ENCOUNTER — Ambulatory Visit (INDEPENDENT_AMBULATORY_CARE_PROVIDER_SITE_OTHER): Payer: PPO | Admitting: Podiatry

## 2022-08-19 DIAGNOSIS — L600 Ingrowing nail: Secondary | ICD-10-CM | POA: Diagnosis not present

## 2022-08-19 NOTE — Progress Notes (Signed)
   Chief Complaint  Patient presents with   Ingrown Toenail    Right hallux ingrow follow-up, patient stated that the medial border is sore now     Subjective: 77 y.o. female presents today status post permanent nail avulsion procedure of the medial and lateral border of the right great toe performed on 07/03/2022.  Patient continued to have some residual tenderness to the area and temporary nail avulsion revision was performed on 08/03/2022.  Patient states that today she does feel much better she does have some slight tenderness however.  Presents for further treatment and evaluation  Past Medical History:  Diagnosis Date   Abnormal mammogram, unspecified 2014   Anxiety    Atypical atrial flutter ([place])    a. 12/2014 s/p RFCA.   History of cardiac cath    a. 03/2014 Cath [facility]): Nl cors.   Mitral valve disorder    a. 03/2014 s/p MV repair [facility]); b. 06/2016 Echo: EF 55-60%, no rwma, Nl fxn'ing MV w/o stenosis. Mildly dil LA/RA.   PAF (paroxysmal atrial fibrillation) ([place])    a. 06/2016 s/p DCCV; b.  CHA2DS2VASc = 4-->Eliquis. Rhythm mgmt w/ amio (prev intol to flecainide).   PSVT (paroxysmal supraventricular tachycardia)    Stroke ([place])    a. 14/9702 embolic L MCA infarct in setting of PAF.    Objective: Neurovascular status intact.  Skin is warm, dry and supple. Nail and respective nail fold appears to be healing appropriately.   Assessment: #1 s/p partial permanent nail matrixectomy medial lateral border right great toe   Plan of care: #1 patient was evaluated  #2 light debridement of the periungual debris was performed to the border of the respective toe and nail plate using a tissue nipper. #3  OTC topical Tolcylen antifungal dispensed at checkout #4 return to clinic as needed   Edrick Kins, DPM [facility]  Dr. Edrick Kins, DPM    [address], [place] [address]                Office [phone]  Fax  [phone]

## 2022-09-16 ENCOUNTER — Other Ambulatory Visit: Payer: Self-pay | Admitting: Internal Medicine

## 2022-09-16 DIAGNOSIS — R0602 Shortness of breath: Secondary | ICD-10-CM | POA: Diagnosis not present

## 2022-09-16 DIAGNOSIS — F419 Anxiety disorder, unspecified: Secondary | ICD-10-CM | POA: Diagnosis not present

## 2022-09-16 DIAGNOSIS — I480 Paroxysmal atrial fibrillation: Secondary | ICD-10-CM | POA: Diagnosis not present

## 2022-09-16 DIAGNOSIS — Z79899 Other long term (current) drug therapy: Secondary | ICD-10-CM | POA: Diagnosis not present

## 2022-09-16 DIAGNOSIS — Z1231 Encounter for screening mammogram for malignant neoplasm of breast: Secondary | ICD-10-CM

## 2022-09-16 DIAGNOSIS — Z0000 Encounter for general adult medical examination without abnormal findings: Secondary | ICD-10-CM | POA: Diagnosis not present

## 2022-09-16 DIAGNOSIS — E7800 Pure hypercholesterolemia, unspecified: Secondary | ICD-10-CM | POA: Diagnosis not present

## 2022-09-16 DIAGNOSIS — I10 Essential (primary) hypertension: Secondary | ICD-10-CM | POA: Diagnosis not present

## 2022-10-09 DIAGNOSIS — R399 Unspecified symptoms and signs involving the genitourinary system: Secondary | ICD-10-CM | POA: Diagnosis not present

## 2022-10-27 DIAGNOSIS — I480 Paroxysmal atrial fibrillation: Secondary | ICD-10-CM | POA: Diagnosis not present

## 2022-10-27 DIAGNOSIS — R0602 Shortness of breath: Secondary | ICD-10-CM | POA: Diagnosis not present

## 2022-10-28 ENCOUNTER — Ambulatory Visit
Admission: RE | Admit: 2022-10-28 | Discharge: 2022-10-28 | Disposition: A | Discharge status: Home / Self Care | Payer: PPO | Source: Ambulatory Visit | Attending: Internal Medicine | Admitting: Internal Medicine

## 2022-10-28 DIAGNOSIS — Z1231 Encounter for screening mammogram for malignant neoplasm of breast: Secondary | ICD-10-CM | POA: Diagnosis not present

## 2022-12-16 DIAGNOSIS — Z79899 Other long term (current) drug therapy: Secondary | ICD-10-CM | POA: Diagnosis not present

## 2022-12-16 DIAGNOSIS — I10 Essential (primary) hypertension: Secondary | ICD-10-CM | POA: Diagnosis not present

## 2022-12-16 DIAGNOSIS — E7800 Pure hypercholesterolemia, unspecified: Secondary | ICD-10-CM | POA: Diagnosis not present

## 2023-02-25 ENCOUNTER — Telehealth: Payer: Self-pay | Admitting: Cardiovascular Disease

## 2023-02-25 ENCOUNTER — Ambulatory Visit: Payer: PPO | Attending: Cardiology | Admitting: Cardiology

## 2023-02-25 VITALS — BP 122/80 | HR 140 | Ht 68.0 in | Wt 154.1 lb

## 2023-02-25 DIAGNOSIS — Z01812 Encounter for preprocedural laboratory examination: Secondary | ICD-10-CM | POA: Diagnosis not present

## 2023-02-25 DIAGNOSIS — I480 Paroxysmal atrial fibrillation: Secondary | ICD-10-CM | POA: Diagnosis not present

## 2023-02-25 DIAGNOSIS — I484 Atypical atrial flutter: Secondary | ICD-10-CM

## 2023-02-25 DIAGNOSIS — I483 Typical atrial flutter: Secondary | ICD-10-CM

## 2023-02-25 DIAGNOSIS — D6869 Other thrombophilia: Secondary | ICD-10-CM | POA: Diagnosis not present

## 2023-02-25 MED ORDER — SODIUM CHLORIDE 0.9 % IV SOLN: INTRAVENOUS | Status: DC

## 2023-02-25 NOTE — Progress Notes (Signed)
Cardiology Office Note Date:  02/25/2023  Patient ID:  Colleen, Williamson 1946/06/27, MRN [id number] PCP:  Colleen Arbour, MD  Cardiologist:  Colleen Nordmann, MD Electrophysiologist: Colleen Bunting, MD    Chief Complaint: palpitations  History of Present Illness: Colleen Williamson is a 77 y.o. female with PMH notable for parox AFib, ATach, Aflutter, HTN, CVA, s/p MVr; seen today for Colleen Bunting, MD for acute visit due to palpitations.   She is s/p MVr 2015 at [facility]; s/p typical aflutter ablation 2016 by Dr. Ladona Williamson.  She last saw Dr. Ladona Williamson 2018. Has been seen regularly by Dr. Mariah Williamson, most recently 06/2022. Was c/o SOB and he started lasix x 3d then PRN. Also having episodes of dizziness, concerned for orthostatic hypoTN.  She joined her sister for a doctors appt in our office a few weeks ago where amiodarone was discussed and all the possible side effects. She has been on amiodarone long-term 200mg  daily and became concerned about the side effect profile, and so she self-lowered her amiodarone to 200mg  every other day. She noticed palpitations, R arm tingling about 2 days ago. At this time, she increased her amiodarone back to 200mg  daily. When her symptoms persisted today, she called the office for an appointment.  She also has noticed increased SOB above her chronic SOB - was SOB walking from waiting room to the exam room today and would not normally be SOB for this short of a distance. She has noticed decreased appetite. Denies lower extremity edema. No chest pain, chest pressure.  She diligently takes eliquis BID, no missed doses in recent years.   AAD History: Flecainide - stopped d/t SE (funny taste, hair loss) Amiodarone - 2018  Past Medical History:  Diagnosis Date   Abnormal mammogram, unspecified 2014   Anxiety    Atypical atrial flutter ([facility])    a. 12/2014 s/p RFCA.   History of cardiac cath    a. 03/2014 Cath [facility]): Nl cors.   Mitral valve disorder    a. 03/2014 s/p MV  repair [facility]); b. 06/2016 Echo: EF 55-60%, no rwma, Nl fxn'ing MV w/o stenosis. Mildly dil LA/RA.   PAF (paroxysmal atrial fibrillation) ([facility])    a. 06/2016 s/p DCCV; b.  CHA2DS2VASc = 4-->Eliquis. Rhythm mgmt w/ amio (prev intol to flecainide).   PSVT (paroxysmal supraventricular tachycardia)    Stroke ([facility])    a. 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    Past Surgical History:  Procedure Laterality Date   ABDOMINAL HYSTERECTOMY  2004   BREAST CYST ASPIRATION  15 YRS AGO   CARDIOVERSION N/A 09/21/2014   Procedure: CARDIOVERSION;  Surgeon: Colleen Stade, MD;  Location: [facility] ENDOSCOPY;  Service: Cardiovascular;  Laterality: N/A;   COLONOSCOPY WITH PROPOFOL N/A 05/15/2020   Procedure: COLONOSCOPY WITH PROPOFOL;  Surgeon: Williamson, Colleen Nearing, MD;  Location: [facility] ENDOSCOPY;  Service: Endoscopy;  Laterality: N/A;   ELECTROPHYSIOLOGIC STUDY N/A 11/28/2014   Procedure: Cardioversion;  Surgeon: Colleen Heading, MD;  Location: [facility] INVASIVE CV LAB;  Service: Cardiovascular;  Laterality: N/A;   ELECTROPHYSIOLOGIC STUDY N/A 12/27/2014   Procedure: A-Flutter;  Surgeon: Marinus Maw, MD;  Location: [facility] INVASIVE CV LAB;  Service: Cardiovascular;  Laterality: N/A;   ELECTROPHYSIOLOGIC STUDY N/A 11/28/2014   Procedure: Cardioversion;  Surgeon: Colleen Heading, MD;  Location: [facility] ORS;  Service: Cardiovascular;  Laterality: N/A;  Have discussed with Dr. Maisie Williamson   ELECTROPHYSIOLOGIC STUDY N/A 07/13/2016   Procedure: Cardioversion;  Surgeon: Colleen Center  Argentina Donovan, MD;  Location: [facility] ORS;  Service: Cardiovascular;  Laterality: N/A;   EYE SURGERY  2007   MITRAL VALVE REPAIR  05/2014   RADIOLOGY WITH ANESTHESIA N/A 07/03/2014   Procedure: RADIOLOGY WITH ANESTHESIA;  Surgeon: Colleen Grout, MD;  Location: [facility] OR;  Service: Radiology;  Laterality: N/A;    Current Outpatient Medications  Medication Instructions   amiodarone (PACERONE) 200 mg, Oral, Daily   apixaban (ELIQUIS) 5 mg, Oral, 2 times daily    carvedilol (COREG) 3.125 mg, Oral, Daily   cephALEXin (KEFLEX) 500 mg, Oral, 3 times daily   cholecalciferol (VITAMIN D3) 400 Units, Oral, Daily   doxycycline (VIBRA-TABS) 100 mg, Oral, 2 times daily   escitalopram (LEXAPRO) 10 mg, Daily   furosemide (LASIX) 20 mg, Oral, Daily PRN   ipratropium (ATROVENT) 0.03 % nasal spray Nasal   magnesium oxide (MAG-OX) 400 mg, Oral, 2 times daily   meclizine (ANTIVERT) 25 mg, Oral, 3 times daily   potassium chloride (KLOR-CON) 10 MEQ tablet Take 1 tablet daily PRN with furosemide.   rosuvastatin (CRESTOR) 10 mg, Oral, Daily    Social History:  The patient  reports that she has quit smoking. She has never used smokeless tobacco. She reports that she does not drink alcohol and does not use drugs.   Family History:  The patient's family history includes Breast cancer in her maternal aunt; Cancer in her father and another family member; Diabetes in an other family member.  ROS:  Please see the history of present illness. All other systems are reviewed and otherwise negative.   PHYSICAL EXAM:  VS:  BP 122/80 (BP Location: Left Arm, Patient Position: Sitting, Cuff Size: Normal)   Pulse (!) 140   Ht 5\' 8"  (1.727 m)   Wt 154 lb 2 oz (69.9 kg)   BMI 23.43 kg/m  BMI: There is no height or weight on file to calculate BMI.  GEN- The patient is well appearing, alert and oriented x 3 today.   Lungs- Clear to ausculation bilaterally, normal work of breathing.  Heart- Regular, very tachycardic rate and rhythm, no murmurs, rubs or gallops Extremities- Trace peripheral edema, warm, dry   EKG is ordered. Personal review of EKG from today shows:  Atypical atrial flutter, rate 141bpm with PVC        Recent Labs: 05/27/2022: ALT 41; BUN 19; Creatinine, Ser 0.83; Hemoglobin 15.0; Platelets 208; Potassium 4.0; Sodium 142; TSH 1.482  05/27/2022: Cholesterol 170; HDL 65; LDL Cholesterol 93; Total CHOL/HDL Ratio 2.6; Triglycerides 60; VLDL 12   CrCl cannot be  calculated (Patient's most recent lab result is older than the maximum 21 days allowed.).   Wt Readings from Last 3 Encounters:  06/30/22 161 lb 4 oz (73.1 kg)  04/23/21 161 lb 8 oz (73.3 kg)  05/15/20 164 lb (74.4 kg)     Additional studies reviewed include: Previous EP, cardiology notes.   TTE, 10/27/2022 (CE from [facility]) INTERPRETATION  NORMAL LEFT VENTRICULAR SYSTOLIC FUNCTION  NORMAL RIGHT VENTRICULAR SYSTOLIC FUNCTION  MILD VALVULAR REGURGITATION (See above)  NO VALVULAR STENOSIS   TTE, 07/12/2016 - Left ventricle: The cavity size was normal. Wall thickness was normal. Systolic function was normal. The estimated ejection fraction was in the range of 55% to 60%. Indeterminate diastolic function (atrial flutter). Wall motion was normal; there were no regional wall motion abnormalities.  - Aortic valve: There was no stenosis.  - Mitral valve: Status post mitral valve repair. There was no evidence for stenosis. There  was no significant regurgitation. Pressure half-time: 97 ms. Mean gradient (D): 4 mm Hg.  - Left atrium: The atrium was mildly dilated.  - Right ventricle: The cavity size was normal. Systolic function was normal.  - Right atrium: The atrium was mildly dilated.  - Pulmonary arteries: No complete TR doppler jet so unable to estimate PA systolic pressure.  - Inferior vena cava: The vessel was normal in size. The respirophasic diameter changes were in the normal range (>= 50%), consistent with normal central venous pressure.   Impressions:   - The patient appeared to be in atrial flutter. Normal LV size with EF 55-60%. Normal RV size and systolic function. Stable repaired mitral valve. 1   ASSESSMENT AND PLAN:  #) parox AFib #) h/o typical aflutter #) atypical atrial flutter S/p typical aflutter ablation 2017 Presents in atypical flutter w RVR Carotid massage attempted by Dr. Graciela Husbands to confirm atrial flutter, unsuccessful Patient's atrial arrhythmias well-controlled  for many years on 200mg  amiodarone daily Recently self-lowered dose to 200mg  every other day a few weeks ago, doubtful that lowering dose to every other day triggered current event with amiodarone's long half-life Will resume 200mg  amiodarone daily Recent TSH normal, LFTs with slight elevation in AST, ALT No missed doses of OAC, so will proceed with DCCV at next available - pre-procedure CBC, CMP, thyroid labs today Discussed with Dr. Ladona Williamson regarding repeat ablation, likely LA flutter, so will refer to Dr. Lalla Brothers for further ablation eval   #) Hypercoag d/t parox afib CHA2DS2-VASc Score = 6 [CHF History: 0, HTN History: 1, Diabetes History: 0, Stroke History: 2, Vascular Disease History: 0, Age Score: 2, Gender Score: 1].  Therefore, the patient's annual risk of stroke is 9.7 %.    Stroke ppx - 5mg  eliquis BID, appropriately dosed No bleeding concerns  Informed Consent   Shared Decision Making/Informed Consent The risks (stroke, cardiac arrhythmias rarely resulting in the need for a temporary or permanent pacemaker, skin irritation or burns and complications associated with conscious sedation including aspiration, arrhythmia, respiratory failure and death), benefits (restoration of normal sinus rhythm) and alternatives of a direct current cardioversion were explained in detail to Colleen Williamson and she agrees to proceed.        Current medicines are reviewed at length with the patient today.   The patient has concerns regarding her medicines.  The following changes were made today:  INCREASE amiodarone 200mg  daily    Labs/ tests ordered today include:  Orders Placed This Encounter  Procedures   CBC   Comp Met (CMET)   TSH   T4   Magnesium   EKG 12-Lead     Disposition: Follow up with EP APP  2-4 weeks post- DCCV      Signed, Sherie Don, NP  02/25/23  10:34 AM  Electrophysiology [facility]

## 2023-02-25 NOTE — Telephone Encounter (Signed)
Pt is requesting a callback after stating she woke up Tuesday morning and the top of her thumb was black but it hasn't changed and now she's concerned. Please advise

## 2023-02-25 NOTE — Patient Instructions (Addendum)
Medication Instructions:  - Your physician has recommended you make the following change in your medication:   1) RESUME Amiodarone 200 mg: - take 1 tablet by mouth once daily   *If you need a refill on your cardiac medications before your next appointment, please call your pharmacy*   Lab Work: - Your physician recommends that you have lab work today:  CMET/ CBC/ TSH/ T4/ Magnesium  If you have labs (blood work) drawn today and your tests are completely normal, you will receive your results only by: MyChart Message (if you have MyChart) OR A paper copy in the mail If you have any lab test that is abnormal or we need to change your treatment, we will call you to review the results.   Testing/Procedures: 1) Cardioversion: - Your physician has recommended that you have a Cardioversion (DCCV). Electrical Cardioversion uses a jolt of electricity to your heart either through paddles or wired patches attached to your chest. This is a controlled, usually prescheduled, procedure. Defibrillation is done under light anesthesia in the hospital, and you usually go home the day of the procedure. This is done to get your heart back into a normal rhythm. You are not awake for the procedure. Please see the instruction sheet given to you today.     You are scheduled for a Cardioversion on Friday, August 2 with Dr. Mariah Milling.  Please arrive at the [facility] Entrance of [facility], [address], [place] [address] at 6:30 AM (This is 1 hour(s) prior to your procedure time).  Proceed to the Check-In Desk directly inside the entrance.  Procedure Parking: Use the entrance off of the [address] side of the hospital. Turn right upon entering and follow the driveway to parking that is directly in front of the [facility] Center. There is no valet parking available at this entrance, however there is an awning directly in front of the [facility]ascular Center for drop off/ pick up for patients.     DIET:  Nothing to eat or drink after midnight except a sip of water with medications (see medication instructions below)   MEDICATION INSTRUCTIONS: !!IF ANY NEW MEDICATIONS ARE STARTED AFTER TODAY, PLEASE NOTIFY YOUR PROVIDER AS SOON AS POSSIBLE!!  FYI: Medications such as Semaglutide (Ozempic, [place]), Tirzepatide (Mounjaro, Zepbound), Dulaglutide (Trulicity), etc ("GLP1 agonists") AND Canagliflozin (Invokana), Dapagliflozin (Farxiga), Empagliflozin (Jardiance), Ertugliflozin (Steglatro), Bexagliflozin [employer]) or any combination with one of these drugs such as Invokamet (Canagliflozin/Metformin), Synjardy (Empagliflozin/Metformin), etc ("SGLT2 inhibitors") must be held around the time of a procedure. This is not a comprehensive list of all of these drugs. Please review all of your medications and talk to your provider if you take any one of these. If you are not sure, ask your provider.    1) You may take all of you regular morning medications the day of your procedure with enough water to get them down safely.   2) Continue taking your anticoagulant (blood thinner): Apixaban (Eliquis).  You will need to continue this after your procedure until you are told by your provider that it is safe to stop.    LABS: Done today   FYI:  For your safety, and to allow [place] to monitor your vital signs accurately during the surgery/procedure we request: If you have artificial nails, gel coating, SNS etc, please have those removed prior to your surgery/procedure. Not having the nail coverings /polish removed may result in cancellation or delay of your surgery/procedure.  You  must have a responsible person to drive you home and stay in the waiting area during your procedure. Failure to do so could result in cancellation.  Bring your insurance cards.  *Special Note: Every effort is made to have your procedure done on time. Occasionally there are emergencies that occur at the hospital that may cause  delays. Please be patient if a delay does occur.       Follow-Up: At [facility], you and your health needs are our priority.  As part of our continuing mission to provide you with exceptional heart care, we have created designated Provider Care Teams.  These Care Teams include your primary Cardiologist (physician) and Advanced Practice Providers (APPs -  Physician Assistants and Nurse Practitioners) who all work together to provide you with the care you need, when you need it.  We recommend signing up for the patient portal called "MyChart".  Sign up information is provided on this After Visit Summary.  MyChart is used to connect with patients for Virtual Visits (Telemedicine).  Patients are able to view lab/test results, encounter notes, upcoming appointments, etc.  Non-urgent messages can be sent to your provider as well.   To learn more about what you can do with MyChart, go to [email or web address].    Your next appointment:   2-4 week(s)  Provider:   Sherie Don, NP    Other Instructions Electrical Cardioversion Electrical cardioversion is the delivery of a jolt of electricity to restore a normal rhythm to the heart. A rhythm that is too fast or is not regular (arrhythmia) keeps the heart from pumping blood well. There is also another type of cardioversion called a chemical (pharmacologic) cardioversion. This is when your health care provider gives you one or more medicines to bring back your regular heart rhythm. Electrical cardioversion is done as a scheduled procedure for arrhythmiasthat are not life-threatening. Electrical cardioversion may also be done in an emergency for sudden life-threatening arrhythmias. Tell a health care provider about: Any allergies you have. All medicines you are taking, including vitamins, herbs, eye drops, creams, and over-the-counter medicines. Any problems you or family members have had with sedatives or anesthesia. Any bleeding problems  you have. Any surgeries you have had, including a pacemaker, defibrillator, or other implanted device. Any medical conditions you have. Whether you are pregnant or may be pregnant. What are the risks? Your provider will talk with you about risks. These include: Allergic reactions to medicines. Irritation to the skin on your chest or back where the sticky pads (electrodes) or paddles were put during electrical cardioversion. A blood clot that breaks free and travels to other parts of your body, such as your brain. Return of a worse abnormal heart rhythm that will need to be treated with medicines, a pacemaker, or an implantable cardioverter defibrillator (ICD). What happens before the procedure? Medicines Your provider may give you: Blood-thinning medicines (anticoagulants) so your blood does not clot as easily. If your provider gives you this medicine, you may need to take it for 4 weeks before the procedure. Medicines to help stabilize your heart rate and rhythm. Ask your provider about: Changing or stopping your regular medicines. These include any diabetes medicines or blood thinners you take. Taking medicines such as aspirin and ibuprofen. These medicines can thin your blood. Do not take them unless your provider tells you to. Taking over-the-counter medicines, vitamins, herbs, and supplements. General instructions Follow instructions from your provider about what you may eat and drink. Do not put  any lotions, powders, or ointments on your chest and back for 24 hours before the procedure. They can cause problems with the electrodes or paddles used to deliver electricity to your heart. Do not wear jewelry as this can interfere with delivering electricity to your heart. If you will be going home right after the procedure, plan to have a responsible adult: Take you home from the hospital or clinic. You will not be allowed to drive. Care for you for the time you are told. Tests You may  have an exam or testing. This may include: Blood labs. A transesophageal echocardiogram (TEE). What happens during the procedure?     An IV will be inserted into one of your veins. You will be given a sedative. This helps you relax. Electrodes or metal paddles will be placed on your chest. They may be placed in one of these ways: One placed on your right chest, the other on the left ribs. One placed on your chest and the other on your back. An electrical shock will be delivered. The shock briefly stops (resets) your heart rhythm. Your provider will check to see if your heart rhythm is now normal. Some people need only one shock. Some need more to restore a normal heart rhythm. The procedure may vary among providers and hospitals. What happens after the procedure? Your blood pressure, heart rate, breathing rate, and blood oxygen level will be monitored until you leave the hospital or clinic. Your heart rhythm will be watched to make sure it does not change. This information is not intended to replace advice given to you by your health care provider. Make sure you discuss any questions you have with your health care provider. Document Revised: 03/05/2022 Document Reviewed: 03/05/2022 Elsevier Patient Education  2024 [employer].

## 2023-02-25 NOTE — Telephone Encounter (Signed)
Called and spoke with patient. Patient states that she woke up Tuesday with a black thumb that she reports is getting better. Patient says that Wednesday morning she woke up with a pounding in her chest and increase shortness of breath. Patient states that the symptoms lasted about 5 -10 minutes. Patient reports that she had pounding in her chest last night when laying down for bed. She states she had pounding in her chest again this morning but it did not last as long. Patient states that about 2 weeks ago she started taking her Amiodarone every other day because she was worried it was effecting her liver. Patient states that since her chest started pounding she is now taking the amiodarone daily. Patient scheduled with Sherie Don, NP today.

## 2023-02-25 NOTE — H&P (View-Only) (Signed)
Cardiology Office Note Date:  02/25/2023  Patient ID:  Colleen, Williamson 1946/06/27, MRN [id number] PCP:  Marguarite Arbour, MD  Cardiologist:  Julien Nordmann, MD Electrophysiologist: Lewayne Bunting, MD    Chief Complaint: palpitations  History of Present Illness: Colleen Williamson is a 77 y.o. female with PMH notable for parox AFib, ATach, Aflutter, HTN, CVA, s/p MVr; seen today for Lewayne Bunting, MD for acute visit due to palpitations.   She is s/p MVr 2015 at [facility]; s/p typical aflutter ablation 2016 by Dr. Ladona Ridgel.  She last saw Dr. Ladona Ridgel 2018. Has been seen regularly by Dr. Mariah Milling, most recently 06/2022. Was c/o SOB and he started lasix x 3d then PRN. Also having episodes of dizziness, concerned for orthostatic hypoTN.  She joined her sister for a doctors appt in our office a few weeks ago where amiodarone was discussed and all the possible side effects. She has been on amiodarone long-term 200mg  daily and became concerned about the side effect profile, and so she self-lowered her amiodarone to 200mg  every other day. She noticed palpitations, R arm tingling about 2 days ago. At this time, she increased her amiodarone back to 200mg  daily. When her symptoms persisted today, she called the office for an appointment.  She also has noticed increased SOB above her chronic SOB - was SOB walking from waiting room to the exam room today and would not normally be SOB for this short of a distance. She has noticed decreased appetite. Denies lower extremity edema. No chest pain, chest pressure.  She diligently takes eliquis BID, no missed doses in recent years.   AAD History: Flecainide - stopped d/t SE (funny taste, hair loss) Amiodarone - 2018  Past Medical History:  Diagnosis Date   Abnormal mammogram, unspecified 2014   Anxiety    Atypical atrial flutter (HCC)    a. 12/2014 s/p RFCA.   History of cardiac cath    a. 03/2014 Cath [facility]): Nl cors.   Mitral valve disorder    a. 03/2014 s/p MV  repair [facility]); b. 06/2016 Echo: EF 55-60%, no rwma, Nl fxn'ing MV w/o stenosis. Mildly dil LA/RA.   PAF (paroxysmal atrial fibrillation) (HCC)    a. 06/2016 s/p DCCV; b.  CHA2DS2VASc = 4-->Eliquis. Rhythm mgmt w/ amio (prev intol to flecainide).   PSVT (paroxysmal supraventricular tachycardia)    Stroke (HCC)    a. 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    Past Surgical History:  Procedure Laterality Date   ABDOMINAL HYSTERECTOMY  2004   BREAST CYST ASPIRATION  15 YRS AGO   CARDIOVERSION N/A 09/21/2014   Procedure: CARDIOVERSION;  Surgeon: Wendall Stade, MD;  Location: [facility] ENDOSCOPY;  Service: Cardiovascular;  Laterality: N/A;   COLONOSCOPY WITH PROPOFOL N/A 05/15/2020   Procedure: COLONOSCOPY WITH PROPOFOL;  Surgeon: Toledo, Boykin Nearing, MD;  Location: [facility] ENDOSCOPY;  Service: Endoscopy;  Laterality: N/A;   ELECTROPHYSIOLOGIC STUDY N/A 11/28/2014   Procedure: Cardioversion;  Surgeon: Dalia Heading, MD;  Location: [facility] INVASIVE CV LAB;  Service: Cardiovascular;  Laterality: N/A;   ELECTROPHYSIOLOGIC STUDY N/A 12/27/2014   Procedure: A-Flutter;  Surgeon: Marinus Maw, MD;  Location: [facility] INVASIVE CV LAB;  Service: Cardiovascular;  Laterality: N/A;   ELECTROPHYSIOLOGIC STUDY N/A 11/28/2014   Procedure: Cardioversion;  Surgeon: Dalia Heading, MD;  Location: [facility] ORS;  Service: Cardiovascular;  Laterality: N/A;  Have discussed with Dr. Maisie Fus   ELECTROPHYSIOLOGIC STUDY N/A 07/13/2016   Procedure: Cardioversion;  Surgeon: Jerolyn Center  Argentina Donovan, MD;  Location: [facility] ORS;  Service: Cardiovascular;  Laterality: N/A;   EYE SURGERY  2007   MITRAL VALVE REPAIR  05/2014   RADIOLOGY WITH ANESTHESIA N/A 07/03/2014   Procedure: RADIOLOGY WITH ANESTHESIA;  Surgeon: Oneal Grout, MD;  Location: [facility] OR;  Service: Radiology;  Laterality: N/A;    Current Outpatient Medications  Medication Instructions   amiodarone (PACERONE) 200 mg, Oral, Daily   apixaban (ELIQUIS) 5 mg, Oral, 2 times daily    carvedilol (COREG) 3.125 mg, Oral, Daily   cephALEXin (KEFLEX) 500 mg, Oral, 3 times daily   cholecalciferol (VITAMIN D3) 400 Units, Oral, Daily   doxycycline (VIBRA-TABS) 100 mg, Oral, 2 times daily   escitalopram (LEXAPRO) 10 mg, Daily   furosemide (LASIX) 20 mg, Oral, Daily PRN   ipratropium (ATROVENT) 0.03 % nasal spray Nasal   magnesium oxide (MAG-OX) 400 mg, Oral, 2 times daily   meclizine (ANTIVERT) 25 mg, Oral, 3 times daily   potassium chloride (KLOR-CON) 10 MEQ tablet Take 1 tablet daily PRN with furosemide.   rosuvastatin (CRESTOR) 10 mg, Oral, Daily    Social History:  The patient  reports that she has quit smoking. She has never used smokeless tobacco. She reports that she does not drink alcohol and does not use drugs.   Family History:  The patient's family history includes Breast cancer in her maternal aunt; Cancer in her father and another family member; Diabetes in an other family member.  ROS:  Please see the history of present illness. All other systems are reviewed and otherwise negative.   PHYSICAL EXAM:  VS:  BP 122/80 (BP Location: Left Arm, Patient Position: Sitting, Cuff Size: Normal)   Pulse (!) 140   Ht 5\' 8"  (1.727 m)   Wt 154 lb 2 oz (69.9 kg)   BMI 23.43 kg/m  BMI: There is no height or weight on file to calculate BMI.  GEN- The patient is well appearing, alert and oriented x 3 today.   Lungs- Clear to ausculation bilaterally, normal work of breathing.  Heart- Regular, very tachycardic rate and rhythm, no murmurs, rubs or gallops Extremities- Trace peripheral edema, warm, dry   EKG is ordered. Personal review of EKG from today shows:  Atypical atrial flutter, rate 141bpm with PVC        Recent Labs: 05/27/2022: ALT 41; BUN 19; Creatinine, Ser 0.83; Hemoglobin 15.0; Platelets 208; Potassium 4.0; Sodium 142; TSH 1.482  05/27/2022: Cholesterol 170; HDL 65; LDL Cholesterol 93; Total CHOL/HDL Ratio 2.6; Triglycerides 60; VLDL 12   CrCl cannot be  calculated (Patient's most recent lab result is older than the maximum 21 days allowed.).   Wt Readings from Last 3 Encounters:  06/30/22 161 lb 4 oz (73.1 kg)  04/23/21 161 lb 8 oz (73.3 kg)  05/15/20 164 lb (74.4 kg)     Additional studies reviewed include: Previous EP, cardiology notes.   TTE, 10/27/2022 (CE from [facility]) INTERPRETATION  NORMAL LEFT VENTRICULAR SYSTOLIC FUNCTION  NORMAL RIGHT VENTRICULAR SYSTOLIC FUNCTION  MILD VALVULAR REGURGITATION (See above)  NO VALVULAR STENOSIS   TTE, 07/12/2016 - Left ventricle: The cavity size was normal. Wall thickness was normal. Systolic function was normal. The estimated ejection fraction was in the range of 55% to 60%. Indeterminate diastolic function (atrial flutter). Wall motion was normal; there were no regional wall motion abnormalities.  - Aortic valve: There was no stenosis.  - Mitral valve: Status post mitral valve repair. There was no evidence for stenosis. There  was no significant regurgitation. Pressure half-time: 97 ms. Mean gradient (D): 4 mm Hg.  - Left atrium: The atrium was mildly dilated.  - Right ventricle: The cavity size was normal. Systolic function was normal.  - Right atrium: The atrium was mildly dilated.  - Pulmonary arteries: No complete TR doppler jet so unable to estimate PA systolic pressure.  - Inferior vena cava: The vessel was normal in size. The respirophasic diameter changes were in the normal range (>= 50%), consistent with normal central venous pressure.   Impressions:   - The patient appeared to be in atrial flutter. Normal LV size with EF 55-60%. Normal RV size and systolic function. Stable repaired mitral valve. 1   ASSESSMENT AND PLAN:  #) parox AFib #) h/o typical aflutter #) atypical atrial flutter S/p typical aflutter ablation 2017 Presents in atypical flutter w RVR Carotid massage attempted by Dr. Graciela Husbands to confirm atrial flutter, unsuccessful Patient's atrial arrhythmias well-controlled  for many years on 200mg  amiodarone daily Recently self-lowered dose to 200mg  every other day a few weeks ago, doubtful that lowering dose to every other day triggered current event with amiodarone's long half-life Will resume 200mg  amiodarone daily Recent TSH normal, LFTs with slight elevation in AST, ALT No missed doses of OAC, so will proceed with DCCV at next available - pre-procedure CBC, CMP, thyroid labs today Discussed with Dr. Ladona Ridgel regarding repeat ablation, likely LA flutter, so will refer to Dr. Lalla Brothers for further ablation eval   #) Hypercoag d/t parox afib CHA2DS2-VASc Score = 6 [CHF History: 0, HTN History: 1, Diabetes History: 0, Stroke History: 2, Vascular Disease History: 0, Age Score: 2, Gender Score: 1].  Therefore, the patient's annual risk of stroke is 9.7 %.    Stroke ppx - 5mg  eliquis BID, appropriately dosed No bleeding concerns  Informed Consent   Shared Decision Making/Informed Consent The risks (stroke, cardiac arrhythmias rarely resulting in the need for a temporary or permanent pacemaker, skin irritation or burns and complications associated with conscious sedation including aspiration, arrhythmia, respiratory failure and death), benefits (restoration of normal sinus rhythm) and alternatives of a direct current cardioversion were explained in detail to Ms. Tocci and she agrees to proceed.        Current medicines are reviewed at length with the patient today.   The patient has concerns regarding her medicines.  The following changes were made today:  INCREASE amiodarone 200mg  daily    Labs/ tests ordered today include:  Orders Placed This Encounter  Procedures   CBC   Comp Met (CMET)   TSH   T4   Magnesium   EKG 12-Lead     Disposition: Follow up with EP APP  2-4 weeks post- DCCV      Signed, Sherie Don, NP  02/25/23  10:34 AM  Electrophysiology [facility]

## 2023-02-26 ENCOUNTER — Ambulatory Visit: Payer: PPO | Admitting: Anesthesiology

## 2023-02-26 ENCOUNTER — Other Ambulatory Visit: Payer: Self-pay

## 2023-02-26 ENCOUNTER — Encounter
Admission: RE | Disposition: A | Discharge status: Home / Self Care | Payer: Self-pay | Source: Home / Self Care | Attending: Cardiovascular Disease

## 2023-02-26 ENCOUNTER — Ambulatory Visit
Admission: RE | Admit: 2023-02-26 | Discharge: 2023-02-26 | Disposition: A | Discharge status: Home / Self Care | Payer: PPO | Attending: Cardiovascular Disease | Admitting: Cardiovascular Disease

## 2023-02-26 ENCOUNTER — Encounter: Payer: Self-pay | Admitting: Cardiovascular Disease

## 2023-02-26 DIAGNOSIS — I484 Atypical atrial flutter: Secondary | ICD-10-CM | POA: Insufficient documentation

## 2023-02-26 DIAGNOSIS — I10 Essential (primary) hypertension: Secondary | ICD-10-CM | POA: Insufficient documentation

## 2023-02-26 DIAGNOSIS — I4892 Unspecified atrial flutter: Secondary | ICD-10-CM | POA: Diagnosis not present

## 2023-02-26 DIAGNOSIS — Z7901 Long term (current) use of anticoagulants: Secondary | ICD-10-CM | POA: Insufficient documentation

## 2023-02-26 DIAGNOSIS — I480 Paroxysmal atrial fibrillation: Secondary | ICD-10-CM | POA: Insufficient documentation

## 2023-02-26 DIAGNOSIS — Z952 Presence of prosthetic heart valve: Secondary | ICD-10-CM | POA: Diagnosis not present

## 2023-02-26 DIAGNOSIS — Z79899 Other long term (current) drug therapy: Secondary | ICD-10-CM | POA: Diagnosis not present

## 2023-02-26 DIAGNOSIS — I483 Typical atrial flutter: Secondary | ICD-10-CM | POA: Diagnosis not present

## 2023-02-26 DIAGNOSIS — Z87891 Personal history of nicotine dependence: Secondary | ICD-10-CM | POA: Diagnosis not present

## 2023-02-26 DIAGNOSIS — Z8673 Personal history of transient ischemic attack (TIA), and cerebral infarction without residual deficits: Secondary | ICD-10-CM | POA: Insufficient documentation

## 2023-02-26 HISTORY — PX: CARDIOVERSION: SHX1299

## 2023-02-26 SURGERY — CARDIOVERSION
Anesthesia: General

## 2023-02-26 MED ORDER — PROPOFOL 10 MG/ML IV BOLUS
INTRAVENOUS | Status: AC
  Filled 2023-02-26: qty 20

## 2023-02-26 MED ORDER — PROPOFOL 10 MG/ML IV BOLUS
INTRAVENOUS | Status: DC | PRN
  Administered 2023-02-26: 50 mg via INTRAVENOUS

## 2023-02-26 NOTE — Anesthesia Preprocedure Evaluation (Signed)
Anesthesia Evaluation  Patient identified by MRN, date of birth, ID band Patient awake    Reviewed: Allergy & Precautions, NPO status , Patient's Chart, lab work & pertinent test results  History of Anesthesia Complications Negative for: history of anesthetic complications  Airway Mallampati: III  TM Distance: >3 FB Neck ROM: Full    Dental  (+) Dental Advidsory Given, Caps, Teeth Intact   Pulmonary neg shortness of breath, neg sleep apnea, neg COPD, neg recent URI, former smoker   breath sounds clear to auscultation- rhonchi (-) wheezing      Cardiovascular Exercise Tolerance: Good hypertension, Pt. on medications (-) angina (-) CAD, (-) Past MI, (-) Cardiac Stents and (-) CABG + dysrhythmias Atrial Fibrillation + Valvular Problems/Murmurs (s/p MVR)  Rhythm:Regular Rate:Normal - Systolic murmurs and - Diastolic murmurs Echo 07/12/16: - Left ventricle: The cavity size was normal. Wall thickness was  normal. Systolic function was normal. The estimated ejection  fraction was in the range of 55% to 60%. Indeterminant diastolic  function (atrial flutter). Wall motion was normal; there were no  regional wall motion abnormalities.  - Aortic valve: There was no stenosis.  - Mitral valve: Status post mitral valve repair. There was no  evidence for stenosis. There was no significant regurgitation.  Pressure half-time: 97 ms. Mean gradient (D): 4 mm Hg.  - Left atrium: The atrium was mildly dilated.  - Right ventricle: The cavity size was normal. Systolic function  was normal.  - Right atrium: The atrium was mildly dilated.  - Pulmonary arteries: No complete TR doppler jet so unable to  estimate PA systolic pressure.  - Inferior vena cava: The vessel was normal in size. The  respirophasic diameter changes were in the normal range (>= 50%),  consistent with normal central venous pressure.    Neuro/Psych neg Seizures  PSYCHIATRIC DISORDERS Anxiety Depression    CVA (occured postop after MVR, R sided, problems with doing certain things with her hand), Residual Symptoms    GI/Hepatic negative GI ROS, Neg liver ROS,,,  Endo/Other  negative endocrine ROSneg diabetes    Renal/GU negative Renal ROS     Musculoskeletal negative musculoskeletal ROS (+)    Abdominal  (+) - obese  Peds  Hematology negative hematology ROS (+)   Anesthesia Other Findings Past Medical History: 2014: Abnormal mammogram, unspecified No date: Anxiety No date: Atypical atrial flutter (HCC)     Comment:  a. 12/2014 s/p RFCA. No date: Breast cyst No date: History of cardiac cath     Comment:  a. 03/2014 Cath [facility]): Nl cors. No date: Mitral valve disorder     Comment:  a. 03/2014 s/p MV repair [facility]); b. 06/2016 Echo: EF               55-60%, no rwma, Nl fxn'ing MV w/o stenosis. Mildly dil               LA/RA. No date: PAF (paroxysmal atrial fibrillation) (HCC)     Comment:  a. 06/2016 s/p DCCV; b.  CHA2DS2VASc = 4-->Eliquis.               Rhythm mgmt w/ amio (prev intol to flecainide). No date: PSVT (paroxysmal supraventricular tachycardia) (HCC) No date: Stroke [facility])     Comment:  a. 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   Reproductive/Obstetrics  Anesthesia Physical Anesthesia Plan  ASA: 3  Anesthesia Plan: General   Post-op Pain Management:    Induction: Intravenous  PONV Risk Score and Plan: 2 and Propofol infusion and TIVA  Airway Management Planned: Natural Airway and Nasal Cannula  Additional Equipment:   Intra-op Plan:   Post-operative Plan:   Informed Consent: I have reviewed the patients History and Physical, chart, labs and discussed the procedure including the risks, benefits and alternatives for the proposed anesthesia with the patient or authorized representative who has indicated his/her understanding and acceptance.     Dental  advisory given  Plan Discussed with: CRNA and Anesthesiologist  Anesthesia Plan Comments:         Anesthesia Quick Evaluation

## 2023-02-26 NOTE — Transfer of Care (Signed)
Immediate Anesthesia Transfer of Care Note  Patient: Colleen Williamson  Procedure(s) Performed: CARDIOVERSION  Patient Location: PACU  Anesthesia Type:MAC  Level of Consciousness: drowsy  Airway & Oxygen Therapy: Patient connected to nasal cannula oxygen  Post-op Assessment: Report given to RN and Post -op Vital signs reviewed and stable  Post vital signs: Reviewed and stable  Last Vitals:  Vitals Value Taken Time  BP 132/89   Temp    Pulse 61   Resp 19 02/26/23 0734  SpO2 99   Vitals shown include unfiled device data.  Last Pain:  Vitals:   02/26/23 0656  TempSrc: Oral  PainSc: 0-No pain         Complications: No notable events documented.

## 2023-02-26 NOTE — Anesthesia Postprocedure Evaluation (Signed)
Anesthesia Post Note  Patient: Colleen Williamson  Procedure(s) Performed: CARDIOVERSION  Anesthesia Type: General Anesthetic complications: no   No notable events documented.   Last Vitals:  Vitals:   02/26/23 0656  BP: (!) 131/93  Pulse: (!) 140  Resp: 17  Temp: 36.7 C  SpO2: 96%    Last Pain:  Vitals:   02/26/23 0656  TempSrc: Oral  PainSc: 0-No pain                 Monico Hoar

## 2023-02-26 NOTE — CV Procedure (Signed)
Cardioversion procedure note For atrial flutter, typical  Procedure Details:  Consent: Risks of procedure as well as the alternatives and risks of each were explained to the (patient/caregiver). Consent for procedure obtained.  Time Out: Verified patient identification, verified procedure, site/side was marked, verified correct patient position, special equipment/implants available, medications/allergies/relevent history reviewed, required imaging and test results available. Performed  Patient placed on cardiac monitor, pulse oximetry, supplemental oxygen as necessary.  Sedation given: propofol IV, Dr. Rosey Bath Pacer pads placed anterior and posterior chest.   Cardioverted 1 time(s).  Cardioverted at  150 J. Synchronized biphasic Converted to NSR   Evaluation: Findings: Post procedure EKG shows: NSR Complications: None Patient did tolerate procedure well.  Time Spent Directly with the Patient:  52 minutes   Esmond Plants, M.D., Ph.D.

## 2023-03-01 NOTE — H&P (Signed)
H&P Addendum, pre-cardioversion ° °Patient was seen and evaluated prior to -cardioversion procedure °Symptoms, prior testing details again confirmed with the patient °Patient examined, no significant change from prior exam °Lab work reviewed in detail personally by myself °Patient understands risk and benefit of the procedure,  °The risks (stroke, cardiac arrhythmias rarely resulting in the need for a temporary or permanent pacemaker, skin irritation or burns and complications associated with conscious sedation including aspiration, arrhythmia, respiratory failure and death), benefits (restoration of normal sinus rhythm) and alternatives of a direct current cardioversion were explained in detail °Patient willing to proceed. ° °Signed, °Tim Aryiah Monterosso, MD, Ph.D °[facility]  °

## 2023-03-01 NOTE — Interval H&P Note (Signed)
History and Physical Interval Note:  03/01/2023 9:17 PM  Colleen Williamson  has presented today for surgery, with the diagnosis of Cardioversion   Afib.  The various methods of treatment have been discussed with the patient and family. After consideration of risks, benefits and other options for treatment, the patient has consented to  Procedure(s): CARDIOVERSION (N/A) as a surgical intervention.  The patient's history has been reviewed, patient examined, no change in status, stable for surgery.  I have reviewed the patient's chart and labs.  Questions were answered to the patient's satisfaction.     Julien Nordmann

## 2023-03-15 NOTE — Progress Notes (Unsigned)
Cardiology Office Note Date:  03/15/2023  Patient ID:  Colleen Williamson, Colleen Williamson 09-30-1945, MRN [id number] PCP:  Marguarite Arbour, MD  Cardiologist:  Julien Nordmann, MD Electrophysiologist: Lewayne Bunting, MD    Chief Complaint: Aflutter, dccv follow-up  History of Present Illness: Colleen Williamson is a 77 y.o. female with PMH notable for parox AFib, ATach, Aflutter, HTN, CVA, s/p MVr; seen today for Lewayne Bunting, MD for acute visit due to palpitations.   She is s/p MVr 2015 at [facility]; s/p typical aflutter ablation 2016 by Dr. Ladona Ridgel. She last saw Dr. Ladona Ridgel 2018. I saw her earlier this month. She had self-lowered amiodarone to 200mg  every other day for fear of side effects. She was likely in atypical flutter during appt, set up for DCCV at next available. S/p successful cardioversion 8/2.   On follow-up today,  *** AF burden, symptoms *** palpitations *** bleeding concerns  - update amio labs today   She denies chest pain, palpitations, dyspnea, PND, orthopnea, nausea, vomiting, dizziness, syncope, edema, weight gain, or early satiety.      AAD History: Flecainide - stopped d/t SE (funny taste, hair loss) Amiodarone - 2018  Past Medical History:  Diagnosis Date   Abnormal mammogram, unspecified 2014   Anxiety    Atypical atrial flutter (HCC)    a. 12/2014 s/p RFCA.   History of cardiac cath    a. 03/2014 Cath [facility]): Nl cors.   Mitral valve disorder    a. 03/2014 s/p MV repair [facility]); b. 06/2016 Echo: EF 55-60%, no rwma, Nl fxn'ing MV w/o stenosis. Mildly dil LA/RA.   PAF (paroxysmal atrial fibrillation) (HCC)    a. 06/2016 s/p DCCV; b.  CHA2DS2VASc = 4-->Eliquis. Rhythm mgmt w/ amio (prev intol to flecainide).   PSVT (paroxysmal supraventricular tachycardia)    Stroke (HCC)    a. 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    Past Surgical History:  Procedure Laterality Date   ABDOMINAL HYSTERECTOMY  2004   BREAST CYST ASPIRATION  15 YRS AGO   CARDIOVERSION N/A  09/21/2014   Procedure: CARDIOVERSION;  Surgeon: Wendall Stade, MD;  Location: [facility] ENDOSCOPY;  Service: Cardiovascular;  Laterality: N/A;   CARDIOVERSION N/A 02/26/2023   Procedure: CARDIOVERSION;  Surgeon: Antonieta Iba, MD;  Location: [facility] ORS;  Service: Cardiovascular;  Laterality: N/A;   COLONOSCOPY WITH PROPOFOL N/A 05/15/2020   Procedure: COLONOSCOPY WITH PROPOFOL;  Surgeon: Toledo, Boykin Nearing, MD;  Location: [facility] ENDOSCOPY;  Service: Endoscopy;  Laterality: N/A;   ELECTROPHYSIOLOGIC STUDY N/A 11/28/2014   Procedure: Cardioversion;  Surgeon: Dalia Heading, MD;  Location: [facility] INVASIVE CV LAB;  Service: Cardiovascular;  Laterality: N/A;   ELECTROPHYSIOLOGIC STUDY N/A 12/27/2014   Procedure: A-Flutter;  Surgeon: Marinus Maw, MD;  Location: [facility] INVASIVE CV LAB;  Service: Cardiovascular;  Laterality: N/A;   ELECTROPHYSIOLOGIC STUDY N/A 11/28/2014   Procedure: Cardioversion;  Surgeon: Dalia Heading, MD;  Location: [facility] ORS;  Service: Cardiovascular;  Laterality: N/A;  Have discussed with Dr. Maisie Fus   ELECTROPHYSIOLOGIC STUDY N/A 07/13/2016   Procedure: Cardioversion;  Surgeon: Iran Ouch, MD;  Location: [facility] ORS;  Service: Cardiovascular;  Laterality: N/A;   EYE SURGERY  2007   MITRAL VALVE REPAIR  05/2014   RADIOLOGY WITH ANESTHESIA N/A 07/03/2014   Procedure: RADIOLOGY WITH ANESTHESIA;  Surgeon: Oneal Grout, MD;  Location: [facility] OR;  Service: Radiology;  Laterality: N/A;    Current Outpatient Medications  Medication Instructions   amiodarone (PACERONE)  200 mg, Oral, Daily   apixaban (ELIQUIS) 5 mg, Oral, 2 times daily   carvedilol (COREG) 3.125 mg, Oral, Daily   cholecalciferol (VITAMIN D3) 400 Units, Oral, Daily   ipratropium (ATROVENT) 0.03 % nasal spray Nasal   meclizine (ANTIVERT) 25 mg, Oral, 3 times daily PRN   rosuvastatin (CRESTOR) 10 mg, Oral, Daily    Social History:  The patient  reports that she has quit smoking. She has never used smokeless tobacco. She reports  that she does not drink alcohol and does not use drugs.   Family History:  The patient's family history includes Breast cancer in her maternal aunt; Cancer in her father and another family member; Diabetes in an other family member.  ROS:  Please see the history of present illness. All other systems are reviewed and otherwise negative.   PHYSICAL EXAM:  VS:  There were no vitals taken for this visit. BMI: There is no height or weight on file to calculate BMI.  GEN- The patient is well appearing, alert and oriented x 3 today.   Lungs- Clear to ausculation bilaterally, normal work of breathing.  Heart- Regular, very tachycardic rate and rhythm, no murmurs, rubs or gallops Extremities- Trace peripheral edema, warm, dry   EKG is ordered. Personal review of EKG from today shows:        Recent Labs: 02/25/2023: ALT 59; BUN 20; Creatinine, Ser 0.87; Hemoglobin 17.0; Magnesium 2.0; Platelets 230; Potassium 4.7; Sodium 139; TSH 1.320  05/27/2022: Cholesterol 170; HDL 65; LDL Cholesterol 93; Total CHOL/HDL Ratio 2.6; Triglycerides 60; VLDL 12   CrCl cannot be calculated (Unknown ideal weight.).   Wt Readings from Last 3 Encounters:  02/26/23 155 lb (70.3 kg)  02/25/23 154 lb 2 oz (69.9 kg)  06/30/22 161 lb 4 oz (73.1 kg)     Additional studies reviewed include: Previous EP, cardiology notes.   TTE, 10/27/2022 (CE from [facility]) INTERPRETATION  NORMAL LEFT VENTRICULAR SYSTOLIC FUNCTION  NORMAL RIGHT VENTRICULAR SYSTOLIC FUNCTION  MILD VALVULAR REGURGITATION (See above)  NO VALVULAR STENOSIS   TTE, 07/12/2016 - Left ventricle: The cavity size was normal. Wall thickness was normal. Systolic function was normal. The estimated ejection fraction was in the range of 55% to 60%. Indeterminate diastolic function (atrial flutter). Wall motion was normal; there were no regional wall motion abnormalities.  - Aortic valve: There was no stenosis.  - Mitral valve: Status post mitral valve repair. There was  no evidence for stenosis. There was no significant regurgitation. Pressure half-time: 97 ms. Mean gradient (D): 4 mm Hg.  - Left atrium: The atrium was mildly dilated.  - Right ventricle: The cavity size was normal. Systolic function was normal.  - Right atrium: The atrium was mildly dilated.  - Pulmonary arteries: No complete TR doppler jet so unable to estimate PA systolic pressure.  - Inferior vena cava: The vessel was normal in size. The respirophasic diameter changes were in the normal range (>= 50%), consistent with normal central venous pressure.   Impressions:  - The patient appeared to be in atrial flutter. Normal LV size with EF 55-60%. Normal RV size and systolic function. Stable repaired mitral valve. 1   ASSESSMENT AND PLAN:  #) parox AFib #) h/o typical aflutter #) atypical atrial flutter S/p typical aflutter ablation 2017 S/p successful DCCV  Presents in atypical flutter w RVR Carotid massage attempted by Dr. Graciela Husbands to confirm atrial flutter, unsuccessful Patient's atrial arrhythmias well-controlled for many years on 200mg  amiodarone daily Recently self-lowered dose  to 200mg  every other day a few weeks ago, doubtful that lowering dose to every other day triggered current event with amiodarone's long half-life Will resume 200mg  amiodarone daily Recent TSH normal, LFTs with slight elevation in AST, ALT No missed doses of OAC, so will proceed with DCCV at next available - pre-procedure CBC, CMP, thyroid labs today Discussed with Dr. Ladona Ridgel regarding repeat ablation, likely LA flutter, so will refer to Dr. Lalla Brothers for further ablation eval   #) Hypercoag d/t parox afib CHA2DS2-VASc Score = 6 [CHF History: 0, HTN History: 1, Diabetes History: 0, Stroke History: 2, Vascular Disease History: 0, Age Score: 2, Gender Score: 1].  Therefore, the patient's annual risk of stroke is 9.7 %.    Stroke ppx - 5mg  eliquis BID, appropriately dosed No bleeding concerns     {Are you  ordering a CV Procedure (e.g. stress test, cath, DCCV, TEE, etc)?   Press F2        :[id number]}    Current medicines are reviewed at length with the patient today.   The patient has concerns regarding her medicines.  The following changes were made today:  INCREASE amiodarone 200mg  daily    Labs/ tests ordered today include:  No orders of the defined types were placed in this encounter.    Disposition: Follow up with EP APP  2-4 weeks post- DCCV      Signed, Sherie Don, NP  03/15/23  9:55 AM  Electrophysiology [facility]

## 2023-03-16 ENCOUNTER — Ambulatory Visit: Payer: PPO | Attending: Cardiology | Admitting: Cardiology

## 2023-03-16 ENCOUNTER — Encounter: Payer: Self-pay | Admitting: Cardiology

## 2023-03-16 VITALS — BP 130/80 | HR 55 | Ht 65.0 in | Wt 152.4 lb

## 2023-03-16 DIAGNOSIS — I484 Atypical atrial flutter: Secondary | ICD-10-CM | POA: Diagnosis not present

## 2023-03-16 DIAGNOSIS — D6869 Other thrombophilia: Secondary | ICD-10-CM | POA: Diagnosis not present

## 2023-03-16 DIAGNOSIS — Z79899 Other long term (current) drug therapy: Secondary | ICD-10-CM

## 2023-03-16 DIAGNOSIS — I483 Typical atrial flutter: Secondary | ICD-10-CM | POA: Diagnosis not present

## 2023-03-16 NOTE — Patient Instructions (Signed)
Medication Instructions:  Your physician recommends that you continue on your current medications as directed. Please refer to the Current Medication list given to you today.  *If you need a refill on your cardiac medications before your next appointment, please call your pharmacy*  Lab Work: Your physician recommends that you return for lab work in early October: CMP, TSH, T4  If you have labs (blood work) drawn today and your tests are completely normal, you will receive your results only by: MyChart Message (if you have MyChart) OR A paper copy in the mail If you have any lab test that is abnormal or we need to change your treatment, we will call you to review the results.  Testing/Procedures: -None ordered  Follow-Up: At [facility], you and your health needs are our priority.  As part of our continuing mission to provide you with exceptional heart care, we have created designated Provider Care Teams.  These Care Teams include your primary Cardiologist (physician) and Advanced Practice Providers (APPs -  Physician Assistants and Nurse Practitioners) who all work together to provide you with the care you need, when you need it.  Your next appointment:   Pre-op appointment in early October  Provider:   Steffanie Dunn, MD    Other Instructions -None

## 2023-03-17 LAB — TSH: TSH: 1.16 u[IU]/mL (ref 0.450–4.500)

## 2023-03-17 LAB — COMPREHENSIVE METABOLIC PANEL
ALT: 68 IU/L — ABNORMAL HIGH (ref 0–32)
AST: 72 IU/L — ABNORMAL HIGH (ref 0–40)
Albumin: 4.4 g/dL (ref 3.8–4.8)
Alkaline Phosphatase: 125 IU/L — ABNORMAL HIGH (ref 44–121)
BUN/Creatinine Ratio: 19 (ref 12–28)
BUN: 15 mg/dL (ref 8–27)
Bilirubin Total: 1.6 mg/dL — ABNORMAL HIGH (ref 0.0–1.2)
CO2: 25 mmol/L (ref 20–29)
Calcium: 9.7 mg/dL (ref 8.7–10.3)
Chloride: 101 mmol/L (ref 96–106)
Creatinine, Ser: 0.77 mg/dL (ref 0.57–1.00)
Globulin, Total: 2.3 g/dL (ref 1.5–4.5)
Glucose: 85 mg/dL (ref 70–99)
Potassium: 4.6 mmol/L (ref 3.5–5.2)
Sodium: 140 mmol/L (ref 134–144)
Total Protein: 6.7 g/dL (ref 6.0–8.5)
eGFR: 80 mL/min/{1.73_m2} (ref >59)

## 2023-03-17 LAB — T4: T4, Total: 13.4 ug/dL — ABNORMAL HIGH (ref 4.5–12.0)

## 2023-05-05 ENCOUNTER — Ambulatory Visit: Payer: PPO | Admitting: Cardiology

## 2023-05-11 NOTE — H&P (View-Only) (Signed)
Electrophysiology Office Follow up Visit Note:    Date:  05/12/2023   ID:  Colleen Williamson, DOB February 29, 1946, MRN [id number]  PCP:  Colleen Arbour, MD  [facility] Cardiologist:  Julien Nordmann, MD  [facility] Electrophysiologist:  Colleen Prude, MD    Interval History:    Colleen Williamson is a 77 y.o. female who presents for a follow up visit.   The patient had a prior atrial flutter ablation with Dr. Ladona Williamson on December 27, 2014.  During the procedure the CTI was ablated.  The patient recently saw Colleen Williamson on March 16, 2023.  The patient has a history of paroxysmal atrial fibrillation, atrial tachycardia, atrial flutter, hypertension, stroke, mitral valve repair.  The mitral valve repair was performed at [facility] in 2015.  The patient was previously on amiodarone but this was self lowered given fear of side effects.  She has previously taken flecainide.  She is referred to discuss catheter ablation.  Today she tells me that her heart rate was elevated during the previous episode of arrhythmia for greater than 12 hours.  It lasted all night.  She was symptomatic with palpitations.  She has chronic shortness of breath.  She is very eager to stop amiodarone and is concerned about the risk of recurrence of her atrial fibrillation and flutter.    Past medical, surgical, social and family history were reviewed.  ROS:   Please see the history of present illness.    All other systems reviewed and are negative.  EKGs/Labs/Other Studies Reviewed:    The following studies were reviewed today:  July 12, 2016 echo EF 55-60 Post mitral valve repair, no stenosis, no significant regurgitation Mildly dilated left atrium RV normal  February 25, 2023 EKG shows regular tachycardia with ventricular rate 140 to 150 bpm.  Appears to be atypical atrial flutter.      Physical Exam:    VS:  BP (!) 144/80   Pulse 67   Ht 5\' 8"  (1.727 m)   Wt 153 lb 12.8 oz (69.8 kg)   SpO2 97%    BMI 23.39 kg/m     Wt Readings from Last 3 Encounters:  05/12/23 153 lb 12.8 oz (69.8 kg)  03/16/23 152 lb 6.4 oz (69.1 kg)  02/26/23 155 lb (70.3 kg)     GEN:  Well nourished, well developed in no acute distress.  Appears younger than stated age. CARDIAC: RRR, no murmurs, rubs, gallops RESPIRATORY:  Clear to auscultation without rales, wheezing or rhonchi       ASSESSMENT:    1. Atypical atrial flutter (HCC)   2. Typical atrial flutter (HCC)   3. Paroxysmal atrial fibrillation (HCC)   4. S/P MVR (mitral valve repair)    PLAN:    In order of problems listed above:  #Atrial fibrillation #Atypical atrial flutter #History of typical atrial flutter The patient has had recurrent symptomatic atrial arrhythmias.  She has previously been on flecainide and amiodarone.  Treatment options have been discussed with the patient including repeat catheter ablation and alternative antiarrhythmic drug.  Discussed treatment options today for AF including antiarrhythmic drug therapy and ablation. Discussed risks, recovery and likelihood of success with each treatment strategy. Risk, benefits, and alternatives to EP study and ablation for afib were discussed. These risks include but are not limited to stroke, bleeding, vascular damage, tamponade, perforation, damage to the esophagus, lungs, phrenic nerve and other structures, pulmonary vein stenosis, worsening renal function, coronary vasospasm and death.  Discussed  potential need for repeat ablation procedures and antiarrhythmic drugs after an initial ablation. The patient understands these risk and wishes to proceed.  We will therefore proceed with catheter ablation at the next available time.  Carto, ICE, anesthesia are requested for the procedure.  Will also obtain CT PV protocol prior to the procedure to exclude LAA thrombus and further evaluate atrial anatomy.  Ablation strategy would be PVI plus posterior wall plus CTI.  She would need full EP  study during the procedure as well to exclude other forms of SVT.  She is going to stop amiodarone today in hopes of allowing her flutter circuits to be inducible.  Continue Eliquis  #History of mitral valve repair NYHA class II.  Warm and dry on exam.  Rhythm control indicated as above.  #Hypertension Slightly above goal today.  Recommend checking blood pressures 1-2 times per week at home and recording the values.  Recommend bringing these recordings to the primary care physician. Continue Coreg.   Signed, Steffanie Dunn, MD, [facility] 05/12/2023 9:17 AM    Electrophysiology [facility]

## 2023-05-11 NOTE — Progress Notes (Unsigned)
Electrophysiology Office Follow up Visit Note:    Date:  05/12/2023   ID:  Colleen Williamson, DOB 24-Apr-1946, MRN [id number]  PCP:  Marguarite Arbour, MD  [facility] Cardiologist:  Julien Nordmann, MD  [facility] Electrophysiologist:  Lanier Prude, MD    Interval History:    Colleen Williamson is a 77 y.o. female who presents for a follow up visit.   The patient had a prior atrial flutter ablation with Dr. Ladona Ridgel on December 27, 2014.  During the procedure the CTI was ablated.  The patient recently saw Levy Sjogren on March 16, 2023.  The patient has a history of paroxysmal atrial fibrillation, atrial tachycardia, atrial flutter, hypertension, stroke, mitral valve repair.  The mitral valve repair was performed at [facility] in 2015.  The patient was previously on amiodarone but this was self lowered given fear of side effects.  She has previously taken flecainide.  She is referred to discuss catheter ablation.  Today she tells me that her heart rate was elevated during the previous episode of arrhythmia for greater than 12 hours.  It lasted all night.  She was symptomatic with palpitations.  She has chronic shortness of breath.  She is very eager to stop amiodarone and is concerned about the risk of recurrence of her atrial fibrillation and flutter.    Past medical, surgical, social and family history were reviewed.  ROS:   Please see the history of present illness.    All other systems reviewed and are negative.  EKGs/Labs/Other Studies Reviewed:    The following studies were reviewed today:  July 12, 2016 echo EF 55-60 Post mitral valve repair, no stenosis, no significant regurgitation Mildly dilated left atrium RV normal  February 25, 2023 EKG shows regular tachycardia with ventricular rate 140 to 150 bpm.  Appears to be atypical atrial flutter.      Physical Exam:    VS:  BP (!) 144/80   Pulse 67   Ht 5\' 8"  (1.727 m)   Wt 153 lb 12.8 oz (69.8 kg)   SpO2 97%    BMI 23.39 kg/m     Wt Readings from Last 3 Encounters:  05/12/23 153 lb 12.8 oz (69.8 kg)  03/16/23 152 lb 6.4 oz (69.1 kg)  02/26/23 155 lb (70.3 kg)     GEN:  Well nourished, well developed in no acute distress.  Appears younger than stated age. CARDIAC: RRR, no murmurs, rubs, gallops RESPIRATORY:  Clear to auscultation without rales, wheezing or rhonchi       ASSESSMENT:    1. Atypical atrial flutter (HCC)   2. Typical atrial flutter (HCC)   3. Paroxysmal atrial fibrillation (HCC)   4. S/P MVR (mitral valve repair)    PLAN:    In order of problems listed above:  #Atrial fibrillation #Atypical atrial flutter #History of typical atrial flutter The patient has had recurrent symptomatic atrial arrhythmias.  She has previously been on flecainide and amiodarone.  Treatment options have been discussed with the patient including repeat catheter ablation and alternative antiarrhythmic drug.  Discussed treatment options today for AF including antiarrhythmic drug therapy and ablation. Discussed risks, recovery and likelihood of success with each treatment strategy. Risk, benefits, and alternatives to EP study and ablation for afib were discussed. These risks include but are not limited to stroke, bleeding, vascular damage, tamponade, perforation, damage to the esophagus, lungs, phrenic nerve and other structures, pulmonary vein stenosis, worsening renal function, coronary vasospasm and death.  Discussed  potential need for repeat ablation procedures and antiarrhythmic drugs after an initial ablation. The patient understands these risk and wishes to proceed.  We will therefore proceed with catheter ablation at the next available time.  Carto, ICE, anesthesia are requested for the procedure.  Will also obtain CT PV protocol prior to the procedure to exclude LAA thrombus and further evaluate atrial anatomy.  Ablation strategy would be PVI plus posterior wall plus CTI.  She would need full EP  study during the procedure as well to exclude other forms of SVT.  She is going to stop amiodarone today in hopes of allowing her flutter circuits to be inducible.  Continue Eliquis  #History of mitral valve repair NYHA class II.  Warm and dry on exam.  Rhythm control indicated as above.  #Hypertension Slightly above goal today.  Recommend checking blood pressures 1-2 times per week at home and recording the values.  Recommend bringing these recordings to the primary care physician. Continue Coreg.   Signed, Steffanie Dunn, MD, [facility], [facility] 05/12/2023 9:17 AM    Electrophysiology [facility]

## 2023-05-12 ENCOUNTER — Ambulatory Visit: Payer: PPO | Attending: Cardiology | Admitting: Cardiology

## 2023-05-12 ENCOUNTER — Encounter: Payer: Self-pay | Admitting: Cardiology

## 2023-05-12 VITALS — BP 144/80 | HR 67 | Ht 68.0 in | Wt 153.8 lb

## 2023-05-12 DIAGNOSIS — I484 Atypical atrial flutter: Secondary | ICD-10-CM

## 2023-05-12 DIAGNOSIS — I480 Paroxysmal atrial fibrillation: Secondary | ICD-10-CM | POA: Diagnosis not present

## 2023-05-12 DIAGNOSIS — I483 Typical atrial flutter: Secondary | ICD-10-CM | POA: Diagnosis not present

## 2023-05-12 DIAGNOSIS — Z9889 Other specified postprocedural states: Secondary | ICD-10-CM | POA: Diagnosis not present

## 2023-05-12 NOTE — Patient Instructions (Signed)
Medication Instructions:  Your physician has recommended you make the following change in your medication:  1) STOP taking amiodarone  *If you need a refill on your cardiac medications before your next appointment, please call your pharmacy*  Lab Work: TODAY: BMET and CBC   Testing/Procedures: Your physician has requested that you have cardiac CT. Cardiac computed tomography (CT) is a painless test that uses an x-ray machine to take clear, detailed pictures of your heart. For further information please visit [email or web address] Please follow instruction sheet as given.  Your physician has recommended that you have an ablation. Catheter ablation is a medical procedure used to treat some cardiac arrhythmias (irregular heartbeats). During catheter ablation, a long, thin, flexible tube is put into a blood vessel in your groin (upper thigh), or neck. This tube is called an ablation catheter. It is then guided to your heart through the blood vessel. Radio frequency waves destroy small areas of heart tissue where abnormal heartbeats may cause an arrhythmia to start. Please see the instruction sheet given to you today.  Follow-Up: At [facility], you and your health needs are our priority.  As part of our continuing mission to provide you with exceptional heart care, we have created designated Provider Care Teams.  These Care Teams include your primary Cardiologist (physician) and Advanced Practice Providers (APPs -  Physician Assistants and Nurse Practitioners) who all work together to provide you with the care you need, when you need it.  Your next appointment:   We will call you to schedule your follow up appointments.

## 2023-05-12 NOTE — Addendum Note (Signed)
Addended by: Frutoso Schatz on: 05/12/2023 09:51 AM   Modules accepted: Orders

## 2023-05-13 LAB — CBC
Hematocrit: 49.9 % — ABNORMAL HIGH (ref 34.0–46.6)
Hemoglobin: 15.7 g/dL (ref 11.1–15.9)
MCH: 29.2 pg (ref 26.6–33.0)
MCHC: 31.5 g/dL (ref 31.5–35.7)
MCV: 93 fL (ref 79–97)
Platelets: 209 10*3/uL (ref 150–450)
RBC: 5.37 x10E6/uL — ABNORMAL HIGH (ref 3.77–5.28)
RDW: 13.1 % (ref 11.7–15.4)
WBC: 4.4 10*3/uL (ref 3.4–10.8)

## 2023-05-13 LAB — BASIC METABOLIC PANEL
BUN/Creatinine Ratio: 24 (ref 12–28)
BUN: 20 mg/dL (ref 8–27)
CO2: 25 mmol/L (ref 20–29)
Calcium: 9.4 mg/dL (ref 8.7–10.3)
Chloride: 105 mmol/L (ref 96–106)
Creatinine, Ser: 0.84 mg/dL (ref 0.57–1.00)
Glucose: 77 mg/dL (ref 70–99)
Potassium: 4.4 mmol/L (ref 3.5–5.2)
Sodium: 143 mmol/L (ref 134–144)
eGFR: 72 mL/min/{1.73_m2} (ref >59)

## 2023-05-26 ENCOUNTER — Telehealth (HOSPITAL_COMMUNITY): Payer: Self-pay | Admitting: *Deleted

## 2023-05-26 NOTE — Telephone Encounter (Signed)
Attempted to call patient regarding upcoming cardiac CT appointment. °Left message on voicemail with name and callback number ° °Edie Vallandingham RN Navigator Cardiac Imaging °[facility] °[phone] Office °[phone] Cell ° °

## 2023-05-27 ENCOUNTER — Ambulatory Visit
Admission: RE | Admit: 2023-05-27 | Discharge: 2023-05-27 | Disposition: A | Discharge status: Home / Self Care | Payer: PPO | Source: Ambulatory Visit | Attending: Cardiology | Admitting: Cardiology

## 2023-05-27 DIAGNOSIS — I480 Paroxysmal atrial fibrillation: Secondary | ICD-10-CM | POA: Insufficient documentation

## 2023-05-27 DIAGNOSIS — I483 Typical atrial flutter: Secondary | ICD-10-CM | POA: Insufficient documentation

## 2023-05-27 DIAGNOSIS — I484 Atypical atrial flutter: Secondary | ICD-10-CM | POA: Diagnosis not present

## 2023-05-27 DIAGNOSIS — Z9889 Other specified postprocedural states: Secondary | ICD-10-CM | POA: Diagnosis not present

## 2023-05-27 MED ORDER — SODIUM CHLORIDE 0.9 % IV BOLUS
150.0000 mL | Freq: Once | INTRAVENOUS | Status: AC
  Administered 2023-05-27: 150 mL via INTRAVENOUS

## 2023-05-27 MED ORDER — IOHEXOL 350 MG/ML SOLN
75.0000 mL | Freq: Once | INTRAVENOUS | Status: AC | PRN
  Administered 2023-05-27: 75 mL via INTRAVENOUS

## 2023-06-09 NOTE — Pre-Procedure Instructions (Signed)
Instructed patient on the following items: Arrival time 1100 Nothing to eat or drink after midnight No meds AM of procedure Responsible person to drive you home and stay with you for 24 hrs  Have you missed any doses of anti-coagulant Eliquis- takes twice a day, hasn't missed any doses.  Don't take dose in the morning.

## 2023-06-10 ENCOUNTER — Ambulatory Visit (HOSPITAL_COMMUNITY)
Admission: RE | Disposition: A | Discharge status: Home / Self Care | Payer: Self-pay | Source: Home / Self Care | Attending: Cardiology

## 2023-06-10 ENCOUNTER — Ambulatory Visit (HOSPITAL_COMMUNITY): Payer: PPO | Admitting: Anesthesiology

## 2023-06-10 ENCOUNTER — Ambulatory Visit (HOSPITAL_BASED_OUTPATIENT_CLINIC_OR_DEPARTMENT_OTHER): Payer: PPO | Admitting: Anesthesiology

## 2023-06-10 ENCOUNTER — Other Ambulatory Visit: Payer: Self-pay

## 2023-06-10 ENCOUNTER — Other Ambulatory Visit (HOSPITAL_COMMUNITY): Payer: Self-pay

## 2023-06-10 ENCOUNTER — Ambulatory Visit (HOSPITAL_COMMUNITY)
Admission: RE | Admit: 2023-06-10 | Discharge: 2023-06-10 | Disposition: A | Discharge status: Home / Self Care | Payer: PPO | Attending: Cardiology | Admitting: Cardiology

## 2023-06-10 DIAGNOSIS — I4891 Unspecified atrial fibrillation: Secondary | ICD-10-CM

## 2023-06-10 DIAGNOSIS — Z7901 Long term (current) use of anticoagulants: Secondary | ICD-10-CM | POA: Diagnosis not present

## 2023-06-10 DIAGNOSIS — I483 Typical atrial flutter: Secondary | ICD-10-CM | POA: Diagnosis not present

## 2023-06-10 DIAGNOSIS — Z79899 Other long term (current) drug therapy: Secondary | ICD-10-CM | POA: Diagnosis not present

## 2023-06-10 DIAGNOSIS — Z952 Presence of prosthetic heart valve: Secondary | ICD-10-CM | POA: Diagnosis not present

## 2023-06-10 DIAGNOSIS — R0602 Shortness of breath: Secondary | ICD-10-CM | POA: Diagnosis not present

## 2023-06-10 DIAGNOSIS — Z87891 Personal history of nicotine dependence: Secondary | ICD-10-CM | POA: Diagnosis not present

## 2023-06-10 DIAGNOSIS — F32A Depression, unspecified: Secondary | ICD-10-CM | POA: Insufficient documentation

## 2023-06-10 DIAGNOSIS — F419 Anxiety disorder, unspecified: Secondary | ICD-10-CM | POA: Insufficient documentation

## 2023-06-10 DIAGNOSIS — I480 Paroxysmal atrial fibrillation: Secondary | ICD-10-CM | POA: Diagnosis not present

## 2023-06-10 DIAGNOSIS — Z8673 Personal history of transient ischemic attack (TIA), and cerebral infarction without residual deficits: Secondary | ICD-10-CM | POA: Diagnosis not present

## 2023-06-10 DIAGNOSIS — I10 Essential (primary) hypertension: Secondary | ICD-10-CM | POA: Diagnosis not present

## 2023-06-10 DIAGNOSIS — F418 Other specified anxiety disorders: Secondary | ICD-10-CM | POA: Diagnosis not present

## 2023-06-10 DIAGNOSIS — I4892 Unspecified atrial flutter: Secondary | ICD-10-CM

## 2023-06-10 DIAGNOSIS — I4819 Other persistent atrial fibrillation: Secondary | ICD-10-CM | POA: Insufficient documentation

## 2023-06-10 HISTORY — PX: ATRIAL FIBRILLATION ABLATION: EP1191

## 2023-06-10 LAB — POCT ACTIVATED CLOTTING TIME
Activated Clotting Time: 227 s
Activated Clotting Time: 314 s

## 2023-06-10 SURGERY — ATRIAL FIBRILLATION ABLATION
Anesthesia: General

## 2023-06-10 MED ORDER — MIDAZOLAM HCL 5 MG/5ML IJ SOLN
INTRAMUSCULAR | Status: AC
  Filled 2023-06-10: qty 5

## 2023-06-10 MED ORDER — HEPARIN SODIUM (PORCINE) 1000 UNIT/ML IJ SOLN
INTRAMUSCULAR | Status: AC
  Filled 2023-06-10: qty 10

## 2023-06-10 MED ORDER — PANTOPRAZOLE SODIUM 40 MG PO TBEC
40.0000 mg | DELAYED_RELEASE_TABLET | Freq: Every day | ORAL | Status: DC
  Administered 2023-06-10: 40 mg via ORAL
  Filled 2023-06-10 (×2): qty 1

## 2023-06-10 MED ORDER — LACTATED RINGERS IV SOLN: INTRAVENOUS | Status: DC | PRN

## 2023-06-10 MED ORDER — SODIUM CHLORIDE 0.9 % IV SOLN: 250.0000 mL | INTRAVENOUS | Status: DC | PRN

## 2023-06-10 MED ORDER — ACETAMINOPHEN 500 MG PO TABS
ORAL_TABLET | ORAL | Status: AC
  Filled 2023-06-10: qty 2

## 2023-06-10 MED ORDER — ONDANSETRON HCL 4 MG/2ML IJ SOLN: 4.0000 mg | Freq: Four times a day (QID) | INTRAMUSCULAR | Status: DC | PRN

## 2023-06-10 MED ORDER — ONDANSETRON HCL 4 MG/2ML IJ SOLN
INTRAMUSCULAR | Status: DC | PRN
  Administered 2023-06-10: 4 mg via INTRAVENOUS

## 2023-06-10 MED ORDER — DEXAMETHASONE SODIUM PHOSPHATE 10 MG/ML IJ SOLN
INTRAMUSCULAR | Status: DC | PRN
  Administered 2023-06-10: 8 mg via INTRAVENOUS

## 2023-06-10 MED ORDER — COLCHICINE 0.6 MG PO TABS
0.6000 mg | ORAL_TABLET | Freq: Two times a day (BID) | ORAL | Status: DC
  Administered 2023-06-10: 0.6 mg via ORAL
  Filled 2023-06-10 (×2): qty 1

## 2023-06-10 MED ORDER — FENTANYL CITRATE (PF) 250 MCG/5ML IJ SOLN
INTRAMUSCULAR | Status: DC | PRN
  Administered 2023-06-10 (×2): 50 ug via INTRAVENOUS

## 2023-06-10 MED ORDER — SODIUM CHLORIDE 0.9% FLUSH: 3.0000 mL | INTRAVENOUS | Status: DC | PRN

## 2023-06-10 MED ORDER — ACETAMINOPHEN 325 MG PO TABS: 650.0000 mg | ORAL_TABLET | ORAL | Status: DC | PRN

## 2023-06-10 MED ORDER — ATROPINE SULFATE 1 MG/10ML IJ SOSY
PREFILLED_SYRINGE | INTRAMUSCULAR | Status: AC
  Filled 2023-06-10: qty 10

## 2023-06-10 MED ORDER — MIDAZOLAM HCL 2 MG/2ML IJ SOLN
INTRAMUSCULAR | Status: DC | PRN
  Administered 2023-06-10: 2 mg via INTRAVENOUS

## 2023-06-10 MED ORDER — HEPARIN (PORCINE) IN NACL 1000-0.9 UT/500ML-% IV SOLN
INTRAVENOUS | Status: DC | PRN
  Administered 2023-06-10 (×3): 500 mL

## 2023-06-10 MED ORDER — SUGAMMADEX SODIUM 200 MG/2ML IV SOLN
INTRAVENOUS | Status: DC | PRN
  Administered 2023-06-10: 200 mg via INTRAVENOUS

## 2023-06-10 MED ORDER — APIXABAN 5 MG PO TABS
5.0000 mg | ORAL_TABLET | Freq: Two times a day (BID) | ORAL | Status: DC
  Administered 2023-06-10: 5 mg via ORAL
  Filled 2023-06-10: qty 1

## 2023-06-10 MED ORDER — ACETAMINOPHEN 500 MG PO TABS
1000.0000 mg | ORAL_TABLET | Freq: Once | ORAL | Status: AC
Start: 2023-06-10 — End: 2023-06-10
  Administered 2023-06-10: 1000 mg via ORAL

## 2023-06-10 MED ORDER — PANTOPRAZOLE SODIUM 40 MG PO TBEC
40.0000 mg | DELAYED_RELEASE_TABLET | Freq: Every day | ORAL | 0 refills | Status: DC
  Filled 2023-06-10: qty 45, 45d supply, fill #0

## 2023-06-10 MED ORDER — ROCURONIUM BROMIDE 10 MG/ML (PF) SYRINGE
PREFILLED_SYRINGE | INTRAVENOUS | Status: DC | PRN
  Administered 2023-06-10: 20 mg via INTRAVENOUS
  Administered 2023-06-10: 50 mg via INTRAVENOUS

## 2023-06-10 MED ORDER — PROPOFOL 10 MG/ML IV BOLUS
INTRAVENOUS | Status: DC | PRN
  Administered 2023-06-10: 100 mg via INTRAVENOUS

## 2023-06-10 MED ORDER — FENTANYL CITRATE (PF) 100 MCG/2ML IJ SOLN
INTRAMUSCULAR | Status: AC
  Filled 2023-06-10: qty 2

## 2023-06-10 MED ORDER — SODIUM CHLORIDE 0.9 % IV SOLN: INTRAVENOUS | Status: DC

## 2023-06-10 MED ORDER — EPHEDRINE SULFATE (PRESSORS) 50 MG/ML IJ SOLN
INTRAMUSCULAR | Status: DC | PRN
  Administered 2023-06-10 (×2): 5 mg via INTRAVENOUS

## 2023-06-10 MED ORDER — COLCHICINE 0.6 MG PO TABS
0.6000 mg | ORAL_TABLET | Freq: Two times a day (BID) | ORAL | 0 refills | Status: DC
  Filled 2023-06-10: qty 10, 5d supply, fill #0

## 2023-06-10 MED ORDER — HEPARIN SODIUM (PORCINE) 1000 UNIT/ML IJ SOLN
INTRAMUSCULAR | Status: DC | PRN
  Administered 2023-06-10: 10000 [IU] via INTRAVENOUS
  Administered 2023-06-10: 6000 [IU] via INTRAVENOUS

## 2023-06-10 MED ORDER — EPHEDRINE SULFATE-NACL 50-0.9 MG/10ML-% IV SOSY
PREFILLED_SYRINGE | INTRAVENOUS | Status: DC | PRN
  Administered 2023-06-10 (×2): 5 mg via INTRAVENOUS

## 2023-06-10 MED ORDER — LIDOCAINE 2% (20 MG/ML) 5 ML SYRINGE
INTRAMUSCULAR | Status: DC | PRN
  Administered 2023-06-10: 100 mg via INTRAVENOUS

## 2023-06-10 MED ORDER — SODIUM CHLORIDE 0.9% FLUSH: 3.0000 mL | Freq: Two times a day (BID) | INTRAVENOUS | Status: DC

## 2023-06-10 MED ORDER — PROTAMINE SULFATE 10 MG/ML IV SOLN
INTRAVENOUS | Status: DC | PRN
  Administered 2023-06-10: 30 mg via INTRAVENOUS

## 2023-06-10 SURGICAL SUPPLY — 19 items
CABLE PFA RX CATH CONN (CABLE) IMPLANT
CATH FARAWAVE ABLATION 31 (CATHETERS) IMPLANT
CATH OCTARAY 2.0 F 3-3-3-3-3 (CATHETERS) IMPLANT
CATH SMTCH THERMOCOOL SF DF (CATHETERS) IMPLANT
CATH SOUNDSTAR ECO 8FR (CATHETERS) IMPLANT
CATH WEB BI DIR CSDF CRV REPRO (CATHETERS) IMPLANT
CLOSURE PERCLOSE PROSTYLE (VASCULAR PRODUCTS) IMPLANT
COVER SWIFTLINK CONNECTOR (BAG) ×1 IMPLANT
DILATOR VESSEL 38 20CM 16FR (INTRODUCER) IMPLANT
GUIDEWIRE INQWIRE 1.5J.035X260 (WIRE) IMPLANT
INQWIRE 1.5J .035X260CM (WIRE) ×1
PACK EP LF (CUSTOM PROCEDURE TRAY) ×1 IMPLANT
PAD DEFIB RADIO PHYSIO CONN (PAD) ×1 IMPLANT
PATCH CARTO3 (PAD) IMPLANT
SHEATH FARADRIVE STEERABLE (SHEATH) IMPLANT
SHEATH PINNACLE 8F 10CM (SHEATH) IMPLANT
SHEATH PINNACLE 9F 10CM (SHEATH) IMPLANT
SHEATH WIRE KIT BAYLIS SL1 (KITS) IMPLANT
TUBING SMART ABLATE COOLFLOW (TUBING) IMPLANT

## 2023-06-10 NOTE — Anesthesia Procedure Notes (Signed)
Procedure Name: Intubation Date/Time: 06/10/2023 1:11 PM  Performed by: Hilda Lias, CRNAPre-anesthesia Checklist: Patient identified, Emergency Drugs available, Suction available and Patient being monitored Patient Re-evaluated:Patient Re-evaluated prior to induction Oxygen Delivery Method: Circle System Utilized Preoxygenation: Pre-oxygenation with 100% oxygen Induction Type: IV induction Ventilation: Mask ventilation without difficulty Laryngoscope Size: Mac and 3 Grade View: Grade I Tube type: Oral Tube size: 7.0 mm Number of attempts: 2 Airway Equipment and Method: Stylet and Oral airway Placement Confirmation: ETT inserted through vocal cords under direct vision, positive ETCO2 and breath sounds checked- equal and bilateral Secured at: 21 cm Tube secured with: Tape Dental Injury: Teeth and Oropharynx as per pre-operative assessment

## 2023-06-10 NOTE — Interval H&P Note (Signed)
History and Physical Interval Note:  06/10/2023 11:44 AM  Colleen Williamson  has presented today for surgery, with the diagnosis of afib.  The various methods of treatment have been discussed with the patient and family. After consideration of risks, benefits and other options for treatment, the patient has consented to  Procedure(s): ATRIAL FIBRILLATION ABLATION (N/A) as a surgical intervention.  The patient's history has been reviewed, patient examined, no change in status, stable for surgery.  I have reviewed the patient's chart and labs.  Questions were answered to the patient's satisfaction.     Evian Derringer T Eivan Gallina

## 2023-06-10 NOTE — Discharge Instructions (Signed)

## 2023-06-10 NOTE — Anesthesia Preprocedure Evaluation (Addendum)
Anesthesia Evaluation  Patient identified by MRN, date of birth, ID band Patient awake    Reviewed: Allergy & Precautions, NPO status , Patient's Chart, lab work & pertinent test results  History of Anesthesia Complications Negative for: history of anesthetic complications  Airway Mallampati: I  TM Distance: >3 FB Neck ROM: Full    Dental  (+) Teeth Intact, Dental Advisory Given   Pulmonary former smoker   breath sounds clear to auscultation       Cardiovascular hypertension, Pt. on medications (-) CAD + dysrhythmias Atrial Fibrillation + Valvular Problems/Murmurs (s/p MV repair)  Rhythm:Regular Rate:Normal  10/2022 ECHO:  NORMAL LEFT VENTRICULAR SYSTOLIC FUNCTION  NORMAL RIGHT VENTRICULAR SYSTOLIC FUNCTION  MILD VALVULAR REGURGITATION (See above)  NO VALVULAR STENOSIS     Neuro/Psych   Anxiety Depression    CVA    GI/Hepatic negative GI ROS, Neg liver ROS,,,  Endo/Other  negative endocrine ROS    Renal/GU negative Renal ROS     Musculoskeletal   Abdominal   Peds  Hematology Eliquis   Anesthesia Other Findings   Reproductive/Obstetrics                             Anesthesia Physical Anesthesia Plan  ASA: 3  Anesthesia Plan: General   Post-op Pain Management: Tylenol PO (pre-op)*   Induction: Intravenous  PONV Risk Score and Plan: 3 and Ondansetron, Dexamethasone and Treatment may vary due to age or medical condition  Airway Management Planned: Oral ETT  Additional Equipment: None  Intra-op Plan:   Post-operative Plan: Extubation in OR  Informed Consent: I have reviewed the patients History and Physical, chart, labs and discussed the procedure including the risks, benefits and alternatives for the proposed anesthesia with the patient or authorized representative who has indicated his/her understanding and acceptance.     Dental advisory given  Plan Discussed with: CRNA  and Surgeon  Anesthesia Plan Comments:         Anesthesia Quick Evaluation

## 2023-06-10 NOTE — Transfer of Care (Signed)
Immediate Anesthesia Transfer of Care Note  Patient: Colleen Williamson  Procedure(s) Performed: ATRIAL FIBRILLATION ABLATION  Patient Location: PACU  Anesthesia Type:General  Level of Consciousness: awake, alert , and oriented  Airway & Oxygen Therapy: Patient Spontanous Breathing and Patient connected to nasal cannula oxygen  Post-op Assessment: Report given to RN and Post -op Vital signs reviewed and stable  Post vital signs: Reviewed and stable  Last Vitals:  Vitals Value Taken Time  BP 139/70 06/10/23 1530  Temp 36.4 C 06/10/23 1514  Pulse 52 06/10/23 1533  Resp 15 06/10/23 1535  SpO2 100 % 06/10/23 1533  Vitals shown include unfiled device data.  Last Pain:  Vitals:   06/10/23 1530  TempSrc:   PainSc: 0-No pain         Complications: There were no known notable events for this encounter.

## 2023-06-10 NOTE — Progress Notes (Signed)
Patient walked to the bathroom without difficulties. Bilateral groins level 0, clean, dry, and intact.

## 2023-06-11 ENCOUNTER — Encounter (HOSPITAL_COMMUNITY): Payer: Self-pay | Admitting: Cardiology

## 2023-06-11 MED FILL — Atropine Sulfate Soln Prefill Syr 1 MG/10ML (0.1 MG/ML): INTRAMUSCULAR | Qty: 10 | Status: AC

## 2023-06-11 MED FILL — Fentanyl Citrate Preservative Free (PF) Inj 100 MCG/2ML: INTRAMUSCULAR | Qty: 2 | Status: AC

## 2023-06-11 NOTE — Anesthesia Postprocedure Evaluation (Signed)
Anesthesia Post Note  Patient: Colleen Williamson  Procedure(s) Performed: ATRIAL FIBRILLATION ABLATION     Patient location during evaluation: Cath Lab Anesthesia Type: General Level of consciousness: awake and alert Pain management: pain level controlled Vital Signs Assessment: post-procedure vital signs reviewed and stable Respiratory status: spontaneous breathing, nonlabored ventilation and respiratory function stable Cardiovascular status: blood pressure returned to baseline and stable Postop Assessment: no apparent nausea or vomiting Anesthetic complications: no   There were no known notable events for this encounter.  Last Vitals:    Last Pain:  Vitals:   06/10/23 1558  TempSrc:   PainSc: 0-No pain                 [name]

## 2023-06-12 ENCOUNTER — Telehealth: Payer: Self-pay | Admitting: Physician Assistant

## 2023-06-12 NOTE — Telephone Encounter (Signed)
Colleen Williamson is a 77 y.o. female who underwent AFib/Flutter ablation 11/14. She was discharged on Colchicine and Protonix. She called the answering service with diarrhea, nausea. Pt gave me permission to speak to daughter, [name]. She has not taken Colchicine since yesterday AM. Still feels s/w nauseated this AM. She says she is "aware" of her chest. She denies pain. She has not had pleuritic chest pain, chest pain with lying supine, shortness of breath, syncope. PLAN: -Hold Colchicine today. -Continue Protonix 40 mg once daily - take 30 min before meals. -Push fluids today, eat bland diet (BRAT) -Try to resume Colchicine tomorrow - once daily with food -Reviewed with Dr. Lalla Brothers - ok to DC if diarrhea continues  Tereso Newcomer, PA-C    06/12/2023 9:29 AM

## 2023-06-15 ENCOUNTER — Other Ambulatory Visit (HOSPITAL_COMMUNITY): Payer: Self-pay

## 2023-06-15 NOTE — Telephone Encounter (Signed)
Pt called for clarification regarding the plan she received from the on call physician. Pt made aware of Charlotte Sanes recommendations. Pt verbalized understanding.

## 2023-06-15 NOTE — Telephone Encounter (Signed)
Patient is calling again to go over the instructions of this medication given prior to procedure. States that Colchicine is still giving her diarrhea. Requesting call back to discuss further.

## 2023-07-07 ENCOUNTER — Ambulatory Visit: Payer: PPO | Admitting: Cardiology

## 2023-07-13 NOTE — Progress Notes (Unsigned)
  Electrophysiology Office Follow up Visit Note:    Date:  07/14/2023   ID:  Colleen Williamson, DOB 1946-07-02, MRN [id number]  PCP:  Marguarite Arbour, MD  [facility] Cardiologist:  Julien Nordmann, MD  [facility] Electrophysiologist:  Lanier Prude, MD    Interval History:     Colleen Williamson is a 77 y.o. female who presents for a follow up visit.   She had an AF ablation 06/10/2023. During the ablation, the veins, posterior wall, peri-mitral flutters were ablated.   Since the ablation, she has done well without recurrence of arrhythmia.  She feels better in normal rhythm.  She continues to take Eliquis.          Past medical, surgical, social and family history were reviewed.  ROS:   Please see the history of present illness.    All other systems reviewed and are negative.  EKGs/Labs/Other Studies Reviewed:    The following studies were reviewed today:          Physical Exam:    VS:  BP 122/86 (BP Location: Left Arm, Patient Position: Sitting, Cuff Size: Normal)   Pulse 67   Ht 5\' 8"  (1.727 m)   Wt 146 lb 6.4 oz (66.4 kg)   SpO2 96%   BMI 22.26 kg/m     Wt Readings from Last 3 Encounters:  07/14/23 146 lb 6.4 oz (66.4 kg)  06/10/23 150 lb (68 kg)  05/12/23 153 lb 12.8 oz (69.8 kg)     GEN: no distress CARD: RRR, No MRG RESP: No IWOB. CTAB.      ASSESSMENT:    1. Atypical atrial flutter (HCC)   2. Typical atrial flutter (HCC)   3. Persistent atrial fibrillation (HCC)   4. Essential hypertension    PLAN:    In order of problems listed above:  #Persistent atrial fibrillation #Typical atrial flutter #Atypical atrial flutter Doing well after ablation 05/2023 and prior CTI ablation 12/2014. On eliquis for stroke ppx.  #Hypertension at goal today.  Recommend checking blood pressures 1-2 times per week at home and recording the values.  Recommend bringing these recordings to the primary care physician.  Follow-up 9 months with  me.   Signed, Steffanie Dunn, MD, [facility], [facility] 07/14/2023 8:24 AM    Electrophysiology [facility]

## 2023-07-14 ENCOUNTER — Ambulatory Visit: Payer: PPO | Attending: Cardiology | Admitting: Cardiology

## 2023-07-14 ENCOUNTER — Encounter: Payer: Self-pay | Admitting: Cardiology

## 2023-07-14 VITALS — BP 122/86 | HR 67 | Ht 68.0 in | Wt 146.4 lb

## 2023-07-14 DIAGNOSIS — I484 Atypical atrial flutter: Secondary | ICD-10-CM | POA: Diagnosis not present

## 2023-07-14 DIAGNOSIS — I4819 Other persistent atrial fibrillation: Secondary | ICD-10-CM

## 2023-07-14 DIAGNOSIS — I483 Typical atrial flutter: Secondary | ICD-10-CM | POA: Diagnosis not present

## 2023-07-14 DIAGNOSIS — I10 Essential (primary) hypertension: Secondary | ICD-10-CM | POA: Diagnosis not present

## 2023-07-14 NOTE — Patient Instructions (Signed)
Medication Instructions:  Your physician recommends that you continue on your current medications as directed. Please refer to the Current Medication list given to you today.  *If you need a refill on your cardiac medications before your next appointment, please call your pharmacy*  Follow-Up: At [facility], you and your health needs are our priority.  As part of our continuing mission to provide you with exceptional heart care, we have created designated Provider Care Teams.  These Care Teams include your primary Cardiologist (physician) and Advanced Practice Providers (APPs -  Physician Assistants and Nurse Practitioners) who all work together to provide you with the care you need, when you need it.  Your next appointment:   9 months  Provider:   Steffanie Dunn, MD

## 2023-07-15 ENCOUNTER — Ambulatory Visit: Payer: PPO | Admitting: Cardiology

## 2023-09-10 ENCOUNTER — Ambulatory Visit: Payer: PPO | Admitting: Cardiology

## 2023-09-15 ENCOUNTER — Ambulatory Visit: Payer: PPO | Admitting: Cardiology

## 2023-09-22 ENCOUNTER — Other Ambulatory Visit: Payer: Self-pay | Admitting: Internal Medicine

## 2023-09-22 DIAGNOSIS — I10 Essential (primary) hypertension: Secondary | ICD-10-CM | POA: Diagnosis not present

## 2023-09-22 DIAGNOSIS — F419 Anxiety disorder, unspecified: Secondary | ICD-10-CM | POA: Diagnosis not present

## 2023-09-22 DIAGNOSIS — Z0000 Encounter for general adult medical examination without abnormal findings: Secondary | ICD-10-CM | POA: Diagnosis not present

## 2023-09-22 DIAGNOSIS — Z1231 Encounter for screening mammogram for malignant neoplasm of breast: Secondary | ICD-10-CM

## 2023-09-22 DIAGNOSIS — R7989 Other specified abnormal findings of blood chemistry: Secondary | ICD-10-CM | POA: Diagnosis not present

## 2023-09-22 DIAGNOSIS — I480 Paroxysmal atrial fibrillation: Secondary | ICD-10-CM | POA: Diagnosis not present

## 2023-09-22 DIAGNOSIS — E7800 Pure hypercholesterolemia, unspecified: Secondary | ICD-10-CM | POA: Diagnosis not present

## 2023-09-22 DIAGNOSIS — Z79899 Other long term (current) drug therapy: Secondary | ICD-10-CM | POA: Diagnosis not present

## 2023-11-03 ENCOUNTER — Ambulatory Visit
Admission: RE | Admit: 2023-11-03 | Discharge: 2023-11-03 | Disposition: A | Discharge status: Home / Self Care | Payer: PPO | Source: Ambulatory Visit | Attending: Internal Medicine | Admitting: Internal Medicine

## 2023-11-03 DIAGNOSIS — Z1231 Encounter for screening mammogram for malignant neoplasm of breast: Secondary | ICD-10-CM | POA: Insufficient documentation

## 2023-11-09 ENCOUNTER — Other Ambulatory Visit: Payer: Self-pay | Admitting: Internal Medicine

## 2023-11-09 DIAGNOSIS — R928 Other abnormal and inconclusive findings on diagnostic imaging of breast: Secondary | ICD-10-CM

## 2023-11-10 ENCOUNTER — Ambulatory Visit
Admission: RE | Admit: 2023-11-10 | Discharge: 2023-11-10 | Disposition: A | Discharge status: Home / Self Care | Source: Ambulatory Visit | Attending: Internal Medicine | Admitting: Internal Medicine

## 2023-11-10 DIAGNOSIS — R928 Other abnormal and inconclusive findings on diagnostic imaging of breast: Secondary | ICD-10-CM | POA: Insufficient documentation

## 2023-11-10 DIAGNOSIS — N6315 Unspecified lump in the right breast, overlapping quadrants: Secondary | ICD-10-CM | POA: Diagnosis not present

## 2023-11-10 DIAGNOSIS — R92321 Mammographic fibroglandular density, right breast: Secondary | ICD-10-CM | POA: Diagnosis not present

## 2023-11-13 ENCOUNTER — Emergency Department
Admission: EM | Admit: 2023-11-13 | Discharge: 2023-11-13 | Disposition: A | Discharge status: Home / Self Care | Attending: Emergency Medicine | Admitting: Emergency Medicine

## 2023-11-13 ENCOUNTER — Emergency Department

## 2023-11-13 ENCOUNTER — Other Ambulatory Visit: Payer: Self-pay

## 2023-11-13 DIAGNOSIS — I4891 Unspecified atrial fibrillation: Secondary | ICD-10-CM | POA: Insufficient documentation

## 2023-11-13 DIAGNOSIS — I4892 Unspecified atrial flutter: Secondary | ICD-10-CM | POA: Diagnosis not present

## 2023-11-13 DIAGNOSIS — I700 Atherosclerosis of aorta: Secondary | ICD-10-CM | POA: Diagnosis not present

## 2023-11-13 DIAGNOSIS — R059 Cough, unspecified: Secondary | ICD-10-CM | POA: Diagnosis not present

## 2023-11-13 DIAGNOSIS — R0602 Shortness of breath: Secondary | ICD-10-CM | POA: Diagnosis present

## 2023-11-13 DIAGNOSIS — Z7901 Long term (current) use of anticoagulants: Secondary | ICD-10-CM | POA: Insufficient documentation

## 2023-11-13 LAB — CBC
HCT: 49.6 % — ABNORMAL HIGH (ref 36.0–46.0)
Hemoglobin: 16.4 g/dL — ABNORMAL HIGH (ref 12.0–15.0)
MCH: 28.5 pg (ref 26.0–34.0)
MCHC: 33.1 g/dL (ref 30.0–36.0)
MCV: 86.3 fL (ref 80.0–100.0)
Platelets: 232 10*3/uL (ref 150–400)
RBC: 5.75 MIL/uL — ABNORMAL HIGH (ref 3.87–5.11)
RDW: 13.1 % (ref 11.5–15.5)
WBC: 5.7 10*3/uL (ref 4.0–10.5)
nRBC: 0 % (ref 0.0–0.2)

## 2023-11-13 LAB — RESP PANEL BY RT-PCR (RSV, FLU A&B, COVID)  RVPGX2
Influenza A by PCR: NEGATIVE
Influenza B by PCR: NEGATIVE
Resp Syncytial Virus by PCR: NEGATIVE
SARS Coronavirus 2 by RT PCR: NEGATIVE

## 2023-11-13 LAB — COMPREHENSIVE METABOLIC PANEL WITH GFR
ALT: 23 U/L (ref 0–44)
AST: 37 U/L (ref 15–41)
Albumin: 4.1 g/dL (ref 3.5–5.0)
Alkaline Phosphatase: 89 U/L (ref 38–126)
Anion gap: 10 (ref 5–15)
BUN: 22 mg/dL (ref 8–23)
CO2: 23 mmol/L (ref 22–32)
Calcium: 9.3 mg/dL (ref 8.9–10.3)
Chloride: 106 mmol/L (ref 98–111)
Creatinine, Ser: 0.71 mg/dL (ref 0.44–1.00)
GFR, Estimated: >60 mL/min (ref >60)
Glucose, Bld: 111 mg/dL — ABNORMAL HIGH (ref 70–99)
Potassium: 4.1 mmol/L (ref 3.5–5.1)
Sodium: 139 mmol/L (ref 135–145)
Total Bilirubin: 2.3 mg/dL — ABNORMAL HIGH (ref 0.0–1.2)
Total Protein: 7.3 g/dL (ref 6.5–8.1)

## 2023-11-13 LAB — TROPONIN I (HIGH SENSITIVITY)
Troponin I (High Sensitivity): 12 ng/L (ref <18)
Troponin I (High Sensitivity): 11 ng/L (ref <18)

## 2023-11-13 MED ORDER — SODIUM CHLORIDE 0.9 % IV BOLUS
1000.0000 mL | Freq: Once | INTRAVENOUS | Status: AC
  Administered 2023-11-13: 1000 mL via INTRAVENOUS

## 2023-11-13 MED ORDER — CARVEDILOL 6.25 MG PO TABS
3.1250 mg | ORAL_TABLET | Freq: Once | ORAL | Status: AC
  Administered 2023-11-13: 3.125 mg via ORAL
  Filled 2023-11-13: qty 1

## 2023-11-13 MED ORDER — ADENOSINE 12 MG/4ML IV SOLN
INTRAVENOUS | Status: AC
  Filled 2023-11-13: qty 4

## 2023-11-13 MED ORDER — DILTIAZEM HCL 25 MG/5ML IV SOLN
10.0000 mg | Freq: Once | INTRAVENOUS | Status: AC
  Administered 2023-11-13: 10 mg via INTRAVENOUS
  Filled 2023-11-13: qty 5

## 2023-11-13 MED ORDER — ADENOSINE 12 MG/4ML IV SOLN
12.0000 mg | Freq: Once | INTRAVENOUS | Status: AC
  Administered 2023-11-13: 12 mg via INTRAVENOUS

## 2023-11-13 MED ORDER — ADENOSINE 6 MG/2ML IV SOLN
6.0000 mg | Freq: Once | INTRAVENOUS | Status: AC
  Administered 2023-11-13: 6 mg via INTRAVENOUS
  Filled 2023-11-13: qty 2

## 2023-11-13 NOTE — ED Notes (Signed)
 Discharge instructions reviewed with pt and pt daughter. Both verbalized understanding. Pt ambulated to w/c from stretcher. Pt discharged home in care of daughter.

## 2023-11-13 NOTE — ED Provider Notes (Signed)
 [facility] Provider Note    Event Date/Time   First MD Initiated Contact with Patient 11/13/23 [phone]     (approximate)  History   Chief Complaint: Rapid heart rate  HPI  Colleen Williamson is a 78 y.o. female with a past medical history of anxiety, atrial fibrillation status post ablation in November on Eliquis  and carvedilol  presents to the emergency department for rapid heart rate.  According to the patient last night she felt her heart rate accelerates significantly.  Patient states mild shortness of breath but denies any chest pain.  No fever.  Patient states since her ablation in November she has not had any rapid heart rate episodes.  Physical Exam   Triage Vital Signs: ED Triage Vitals  Encounter Vitals Group     BP 11/13/23 0901 (!) 122/95     Systolic BP Percentile --      Diastolic BP Percentile --      Pulse Rate 11/13/23 0901 (!) 166     Resp 11/13/23 0901 16     Temp 11/13/23 0901 (!) 97.5 F (36.4 C)     Temp Source 11/13/23 0901 Oral     SpO2 11/13/23 0901 97 %     Weight 11/13/23 0902 140 lb (63.5 kg)     Height 11/13/23 0902 5\' 8"  (1.727 m)     Head Circumference --      Peak Flow --      Pain Score 11/13/23 0902 0     Pain Loc --      Pain Education --      Exclude from Growth Chart --     Most recent vital signs: Vitals:   11/13/23 0901 11/13/23 0918  BP: (!) 122/95   Pulse: (!) 166   Resp: 16   Temp: (!) 97.5 F (36.4 C)   SpO2: 97% 96%    General: Awake, no distress.  CV:  Good peripheral perfusion.  Regular rhythm rate around 160 bpm. Resp:  Normal effort.  Equal breath sounds bilaterally. Abd:  No distention.  Soft, nontender.  No rebound or guarding. ED Results / Procedures / Treatments   EKG  EKG viewed and interpreted by myself shows sinus tachycardia at 166 bpm with a narrow QRS, normal axis, normal intervals, nonspecific ST changes.  Repeat EKG viewed and interpreted by myself shows atrial fibrillation at  111 bpm with a narrow QRS, normal axis, normal intervals nonspecific ST changes.  RADIOLOGY  Chest x-ray shows no acute disease.   MEDICATIONS ORDERED IN ED: Medications - No data to display   IMPRESSION / MDM / ASSESSMENT AND PLAN / ED COURSE  I reviewed the triage vital signs and the nursing notes.  Patient's presentation is most consistent with acute presentation with potential threat to life or bodily function.  Patient resents emergency department for heart palpitations/rapid heart rate.  Patient found to be in a tachycardic rhythm around 166 bpm.  However as the rhythm appears very irregular I suspect this is either SVT versus atrial flutter with a 2-1 block.  Attempted adenosine  without relief however he did show likely atrial flutter.  Give the patient 10 mg of IV diltiazem  and the patient appeared to convert back to a sinus rhythm.  Patient's workup shows a reassuring troponin negative x 2, chemistry reassuring, CBC normal, COVID panel is negative and chest x-ray shows no acute process.  Patient appears to have converted back to an atrial fibrillation rhythm around 100 bpm.  Patient has not taken her Eliquis  or Coreg  this morning.  Will dose the patient with Coreg  in the emergency department she will take her Eliquis  when she gets home.  Discussed with the patient to increase her Coreg  from 3.125 daily to 3.125 twice daily until she can be seen by cardiology.  Discussed return precautions for any rapid heart rate or shortness of breath.  Patient agreeable to plan of care.  CRITICAL CARE Performed by: Ruth Cove   Total critical care time: 30 minutes  Critical care time was exclusive of separately billable procedures and treating other patients.  Critical care was necessary to treat or prevent imminent or life-threatening deterioration.  Critical care was time spent personally by me on the following activities: development of treatment plan with patient and/or surrogate  as well as nursing, discussions with consultants, evaluation of patient's response to treatment, examination of patient, obtaining history from patient or surrogate, ordering and performing treatments and interventions, ordering and review of laboratory studies, ordering and review of radiographic studies, pulse oximetry and re-evaluation of patient's condition.   FINAL CLINICAL IMPRESSION(S) / ED DIAGNOSES   Atrial flutter Atrial fibrillation   Note:  This document was prepared using Dragon voice recognition software and may include unintentional dictation errors.   Ruth Cove, MD 11/13/23 1240

## 2023-11-13 NOTE — ED Triage Notes (Signed)
 Pt states she had an ablation for A-Fib in November. Today, she feels as though she's back in A-Fib. States she feels as though her heart is racing. Pt c/o of lightheadedness and SOB.

## 2023-11-13 NOTE — Discharge Instructions (Addendum)
 As we discussed please begin taking your Coreg  medication 3.125 mg twice daily.  Please call your cardiologist to arrange a follow-up appointment as soon as possible.  Return to the emergency department for any chest pain shortness of breath or return of significant elevated heart rate or any other symptom concerning to yourself.

## 2023-11-15 ENCOUNTER — Emergency Department (HOSPITAL_COMMUNITY)

## 2023-11-15 ENCOUNTER — Other Ambulatory Visit: Payer: Self-pay

## 2023-11-15 ENCOUNTER — Encounter (HOSPITAL_COMMUNITY): Payer: Self-pay

## 2023-11-15 ENCOUNTER — Telehealth: Payer: Self-pay | Admitting: Cardiology

## 2023-11-15 ENCOUNTER — Ambulatory Visit: Attending: Physician Assistant | Admitting: Physician Assistant

## 2023-11-15 ENCOUNTER — Inpatient Hospital Stay (HOSPITAL_COMMUNITY)
Admission: EM | Admit: 2023-11-15 | Discharge: 2023-11-19 | DRG: 309 | Disposition: A | Discharge status: Home / Self Care | Attending: Cardiology | Admitting: Cardiology

## 2023-11-15 ENCOUNTER — Encounter

## 2023-11-15 ENCOUNTER — Encounter: Payer: Self-pay | Admitting: Physician Assistant

## 2023-11-15 ENCOUNTER — Other Ambulatory Visit

## 2023-11-15 VITALS — BP 88/46 | HR 168 | Ht 68.0 in | Wt 141.8 lb

## 2023-11-15 DIAGNOSIS — I4892 Unspecified atrial flutter: Principal | ICD-10-CM | POA: Diagnosis present

## 2023-11-15 DIAGNOSIS — Z882 Allergy status to sulfonamides status: Secondary | ICD-10-CM | POA: Diagnosis not present

## 2023-11-15 DIAGNOSIS — I484 Atypical atrial flutter: Secondary | ICD-10-CM | POA: Diagnosis not present

## 2023-11-15 DIAGNOSIS — I4719 Other supraventricular tachycardia: Secondary | ICD-10-CM | POA: Diagnosis not present

## 2023-11-15 DIAGNOSIS — I4819 Other persistent atrial fibrillation: Secondary | ICD-10-CM | POA: Diagnosis not present

## 2023-11-15 DIAGNOSIS — R000 Tachycardia, unspecified: Secondary | ICD-10-CM | POA: Diagnosis not present

## 2023-11-15 DIAGNOSIS — I10 Essential (primary) hypertension: Secondary | ICD-10-CM | POA: Diagnosis not present

## 2023-11-15 DIAGNOSIS — F418 Other specified anxiety disorders: Secondary | ICD-10-CM | POA: Diagnosis not present

## 2023-11-15 DIAGNOSIS — E876 Hypokalemia: Secondary | ICD-10-CM | POA: Diagnosis not present

## 2023-11-15 DIAGNOSIS — I110 Hypertensive heart disease with heart failure: Secondary | ICD-10-CM | POA: Diagnosis not present

## 2023-11-15 DIAGNOSIS — I5032 Chronic diastolic (congestive) heart failure: Secondary | ICD-10-CM | POA: Diagnosis not present

## 2023-11-15 DIAGNOSIS — F419 Anxiety disorder, unspecified: Secondary | ICD-10-CM | POA: Diagnosis not present

## 2023-11-15 DIAGNOSIS — D6869 Other thrombophilia: Secondary | ICD-10-CM | POA: Diagnosis not present

## 2023-11-15 DIAGNOSIS — Z888 Allergy status to other drugs, medicaments and biological substances status: Secondary | ICD-10-CM | POA: Diagnosis not present

## 2023-11-15 DIAGNOSIS — I340 Nonrheumatic mitral (valve) insufficiency: Secondary | ICD-10-CM | POA: Diagnosis not present

## 2023-11-15 DIAGNOSIS — Z952 Presence of prosthetic heart valve: Secondary | ICD-10-CM | POA: Diagnosis not present

## 2023-11-15 DIAGNOSIS — Z833 Family history of diabetes mellitus: Secondary | ICD-10-CM | POA: Diagnosis not present

## 2023-11-15 DIAGNOSIS — R42 Dizziness and giddiness: Secondary | ICD-10-CM | POA: Diagnosis not present

## 2023-11-15 DIAGNOSIS — Z87891 Personal history of nicotine dependence: Secondary | ICD-10-CM | POA: Diagnosis not present

## 2023-11-15 DIAGNOSIS — I9589 Other hypotension: Secondary | ICD-10-CM | POA: Diagnosis not present

## 2023-11-15 DIAGNOSIS — Z7901 Long term (current) use of anticoagulants: Secondary | ICD-10-CM | POA: Diagnosis not present

## 2023-11-15 DIAGNOSIS — I4891 Unspecified atrial fibrillation: Secondary | ICD-10-CM | POA: Diagnosis not present

## 2023-11-15 DIAGNOSIS — Z79899 Other long term (current) drug therapy: Secondary | ICD-10-CM

## 2023-11-15 DIAGNOSIS — I2489 Other forms of acute ischemic heart disease: Secondary | ICD-10-CM | POA: Diagnosis not present

## 2023-11-15 DIAGNOSIS — Z8673 Personal history of transient ischemic attack (TIA), and cerebral infarction without residual deficits: Secondary | ICD-10-CM

## 2023-11-15 DIAGNOSIS — R0789 Other chest pain: Secondary | ICD-10-CM | POA: Diagnosis not present

## 2023-11-15 DIAGNOSIS — D6859 Other primary thrombophilia: Secondary | ICD-10-CM | POA: Diagnosis not present

## 2023-11-15 DIAGNOSIS — I639 Cerebral infarction, unspecified: Secondary | ICD-10-CM | POA: Diagnosis not present

## 2023-11-15 LAB — CBC WITH DIFFERENTIAL/PLATELET
Abs Immature Granulocytes: 0.01 10*3/uL (ref 0.00–0.07)
Basophils Absolute: 0 10*3/uL (ref 0.0–0.1)
Basophils Relative: 1 %
Eosinophils Absolute: 0.1 10*3/uL (ref 0.0–0.5)
Eosinophils Relative: 2 %
HCT: 41.5 % (ref 36.0–46.0)
Hemoglobin: 13.5 g/dL (ref 12.0–15.0)
Immature Granulocytes: 0 %
Lymphocytes Relative: 27 %
Lymphs Abs: 1.5 10*3/uL (ref 0.7–4.0)
MCH: 29.3 pg (ref 26.0–34.0)
MCHC: 32.5 g/dL (ref 30.0–36.0)
MCV: 90.2 fL (ref 80.0–100.0)
Monocytes Absolute: 0.5 10*3/uL (ref 0.1–1.0)
Monocytes Relative: 9 %
Neutro Abs: 3.4 10*3/uL (ref 1.7–7.7)
Neutrophils Relative %: 61 %
Platelets: 186 10*3/uL (ref 150–400)
RBC: 4.6 MIL/uL (ref 3.87–5.11)
RDW: 13.5 % (ref 11.5–15.5)
WBC: 5.5 10*3/uL (ref 4.0–10.5)
nRBC: 0 % (ref 0.0–0.2)

## 2023-11-15 LAB — COMPREHENSIVE METABOLIC PANEL WITH GFR
ALT: 23 U/L (ref 0–44)
AST: 36 U/L (ref 15–41)
Albumin: 3 g/dL — ABNORMAL LOW (ref 3.5–5.0)
Alkaline Phosphatase: 69 U/L (ref 38–126)
Anion gap: 6 (ref 5–15)
BUN: 23 mg/dL (ref 8–23)
CO2: 21 mmol/L — ABNORMAL LOW (ref 22–32)
Calcium: 7.5 mg/dL — ABNORMAL LOW (ref 8.9–10.3)
Chloride: 115 mmol/L — ABNORMAL HIGH (ref 98–111)
Creatinine, Ser: 0.76 mg/dL (ref 0.44–1.00)
GFR, Estimated: >60 mL/min (ref >60)
Glucose, Bld: 88 mg/dL (ref 70–99)
Potassium: 3.6 mmol/L (ref 3.5–5.1)
Sodium: 142 mmol/L (ref 135–145)
Total Bilirubin: 1.6 mg/dL — ABNORMAL HIGH (ref 0.0–1.2)
Total Protein: 5.4 g/dL — ABNORMAL LOW (ref 6.5–8.1)

## 2023-11-15 LAB — TROPONIN I (HIGH SENSITIVITY)
Troponin I (High Sensitivity): 40 ng/L — ABNORMAL HIGH (ref <18)
Troponin I (High Sensitivity): 57 ng/L — ABNORMAL HIGH (ref <18)

## 2023-11-15 LAB — MAGNESIUM: Magnesium: 1.5 mg/dL — ABNORMAL LOW (ref 1.7–2.4)

## 2023-11-15 MED ORDER — APIXABAN 5 MG PO TABS
5.0000 mg | ORAL_TABLET | Freq: Two times a day (BID) | ORAL | Status: DC
  Administered 2023-11-15 – 2023-11-19 (×8): 5 mg via ORAL
  Filled 2023-11-15 (×8): qty 1

## 2023-11-15 MED ORDER — DILTIAZEM LOAD VIA INFUSION
10.0000 mg | Freq: Once | INTRAVENOUS | Status: AC
  Administered 2023-11-15: 10 mg via INTRAVENOUS
  Filled 2023-11-15: qty 10

## 2023-11-15 MED ORDER — DOFETILIDE 500 MCG PO CAPS: 500.0000 ug | ORAL_CAPSULE | Freq: Two times a day (BID) | ORAL | Status: DC

## 2023-11-15 MED ORDER — SODIUM CHLORIDE 0.9% FLUSH: 3.0000 mL | INTRAVENOUS | Status: DC | PRN

## 2023-11-15 MED ORDER — SODIUM CHLORIDE 0.9 % IV SOLN: 250.0000 mL | INTRAVENOUS | Status: AC | PRN

## 2023-11-15 MED ORDER — APIXABAN 5 MG PO TABS
5.0000 mg | ORAL_TABLET | Freq: Once | ORAL | Status: AC
  Administered 2023-11-15: 5 mg via ORAL
  Filled 2023-11-15: qty 1

## 2023-11-15 MED ORDER — POTASSIUM CHLORIDE CRYS ER 20 MEQ PO TBCR
60.0000 meq | EXTENDED_RELEASE_TABLET | Freq: Once | ORAL | Status: AC
  Administered 2023-11-15: 60 meq via ORAL
  Filled 2023-11-15: qty 3

## 2023-11-15 MED ORDER — DILTIAZEM HCL-DEXTROSE 125-5 MG/125ML-% IV SOLN (PREMIX)
5.0000 mg/h | INTRAVENOUS | Status: DC
  Administered 2023-11-15: 5 mg/h via INTRAVENOUS
  Filled 2023-11-15 (×2): qty 125

## 2023-11-15 MED ORDER — MAGNESIUM SULFATE 4 GM/100ML IV SOLN
4.0000 g | Freq: Once | INTRAVENOUS | Status: AC
  Administered 2023-11-15: 4 g via INTRAVENOUS
  Filled 2023-11-15: qty 100

## 2023-11-15 MED ORDER — SODIUM CHLORIDE 0.9% FLUSH
3.0000 mL | Freq: Two times a day (BID) | INTRAVENOUS | Status: DC
  Administered 2023-11-15 – 2023-11-19 (×5): 3 mL via INTRAVENOUS

## 2023-11-15 NOTE — Progress Notes (Addendum)
 Cardiology Office Note:  .   Date:  11/15/2023  ID:  [name], DOB [birth year], MRN [id number] PCP: Yehuda Helms, MD  [name] Cardiologist:  Belva Boyden, MD Electrophysiologist:  Boyce Byes, MD {  History of Present Illness: .   Colleen Williamson is a 78 y.o. female w/PMHx of  MV repair 2017 stroke HTN Atrial fibrillation and flutter (typical/atypical)  Saw Dr. Marven Slimmer 07/14/23, post ablation, doing well, no recurrent arrhythmia as of then, planned for 9 mo visit  ER visit 11/13/23 w/palpitation rapid heart beat Reported to have started the evening prior Given adenosine  with out CV >> no clear comment on underlying rhythjm though f/u EKG felt to be Afib Give IV dilt that reported slowing and eventual SR by note > then back to AFib Note mentioned she had not taken her AM meds (eliquis /coreg ) Given home dose of coreg  and advised to take her Eliquis  once home Discharged rate controlled ~100bpm  Today's visit is scheduled as work in/add on today for post ER visit   ROS:   She comes with her daughter Darrell Else out of nowhere Saturday her heart started racing, persisted on and went to the ER She did feel a bit better going home from the ER but last night again started to race She feels weak, lightheaded, SOB and heavy in her chest this morning Has not had near syncope or syncope She took her coreg  this AM, not her eliquis    Arrhythmia/AAD hx CTI ablation 12/2014  AFib ablation 06/10/23 (During the ablation, the veins, posterior wall, peri-mitral flutters were ablated)  Remote flecainide  stopped with intolerance 2/2 poor taste inher mouth Amiodarone  started 2016 >>  stopped pre-ablation Oct 2024   Studies Reviewed: Aaron Aas    EKG done today and reviewed by myself:  AFlutter/ATach 168bpm, some aberrantly conducted beats  In my review of her ER EKS, no afib, also dont think she had SR, suspect she was in an ATach with variable AV  conduction  06/10/23: EPS/ablation CONCLUSIONS: 1. Successful PVI 2. Successful ablation/isolation of the posterior wall 3. Successful ablation of perimitral atypical flutter (MV-RSPV line of ablation) 4. Intracardiac echo reveals trivial pericardial effusion, dilated LA 5. No early apparent complications. 6. Colchicine  0.6mg  PO BID x 5 days 7. Protonix  40mg  PO daily x 45 days   05/27/23: cardiac CT IMPRESSION: 1. There is normal pulmonary vein drainage into the left atrium. (2 on the right and 2 on the left).   2. The left atrial appendage is a large chicken wing type with ostial size 38 x 31 mm and length 51 mm, Area 90 mm2. There is no thrombus in the left atrial appendage.   3. The esophagus runs in the left atrial midline and is not in the proximity to any of the pulmonary veins.   4.  Coronary calcium score of 0.   05/12/2016: TTE Study Conclusions  - Left ventricle: The cavity size was normal. Wall thickness was    normal. Systolic function was normal. The estimated ejection    fraction was in the range of 55% to 60%. Indeterminant diastolic    function (atrial flutter). Wall motion was normal; there were no    regional wall motion abnormalities.  - Aortic valve: There was no stenosis.  - Mitral valve: Status post mitral valve repair. There was no    evidence for stenosis. There was no significant regurgitation.    Pressure half-time: 97 ms. Mean gradient (D): 4 mm  Hg.  - Left atrium: The atrium was mildly dilated.  - Right ventricle: The cavity size was normal. Systolic function    was normal.  - Right atrium: The atrium was mildly dilated.  - Pulmonary arteries: No complete TR doppler jet so unable to    estimate PA systolic pressure.  - Inferior vena cava: The vessel was normal in size. The    respirophasic diameter changes were in the normal range (>= 50%),    consistent with normal central venous pressure.    Risk Assessment/Calculations:    Physical  Exam:   VS:  There were no vitals taken for this visit.   Wt Readings from Last 3 Encounters:  11/13/23 140 lb (63.5 kg)  07/14/23 146 lb 6.4 oz (66.4 kg)  06/10/23 150 lb (68 kg)    GEN: Well nourished, well developed in no acute distress NECK: No JVD; No carotid bruits CARDIAC: RRR/tachycardic, no murmurs, rubs, gallops RESPIRATORY:  CTA b/l without rales, wheezing or rhonchi  ABDOMEN: Soft, non-tender, non-distended EXTREMITIES: No edema; No deformity  Skin is warm, dry  ASSESSMENT AND PLAN: .    persistent AFib AFlutter (typical and atypical) CHA2DS2Vasc is 6, on Eliquis , appropriately dosed >> MISSED a dose this morning and Saturday morning  Suspect an atrial tachycardia    HTN Hypotensive this morning  4. Hx MV repair (remotely)  Secondary hypercoagulable state 2/2 AFib   Seen as well by DOD Pt/daughter feel best with EMS, 911 called Arrived     Dispo: pending ER/admission/plan of care  Signed, [name] Randol Butt, PA-C

## 2023-11-15 NOTE — Progress Notes (Signed)
 Pharmacy: Dofetilide  (Tikosyn ) - Initial Consult Assessment and Electrolyte Replacement  Pharmacy consulted to assist in monitoring and replacing electrolytes in this 78 y.o. female admitted on 11/15/2023 undergoing dofetilide  initiation. First dofetilide  dose: 500mcg 4/22 @2000   Assessment:  Patient Exclusion Criteria: If any screening criteria checked as "Yes", then  patient  should NOT receive dofetilide  until criteria item is corrected.  If "Yes" please indicate correction plan.  YES  NO Patient  Exclusion Criteria Correction Plan/Comments   []   [x]   Baseline QTc interval is greater than or equal to 440 msec. IF above YES box checked dofetilide  contraindicated unless patient has ICD; then may proceed if QTc 500-550 msec or with known ventricular conduction abnormalities may proceed with QTc 550-600 msec. QTc =   "Her QTc in SR was 458 ms. So, given that we hope she will convert and be in that ball park" per discussion with EP.   First dose tomorrow after cardioversion, will recheck prior to giving dose.   []   [x]   Patient is known or suspected to have a digoxin level greater than 2 ng/ml: No results found for: "DIGOXIN"     []   [x]   Creatinine clearance less than 20 ml/min (calculated using Cockcroft-Gault, actual body weight and serum creatinine): Estimated Creatinine Clearance: 61.3 mL/min (by C-G formula based on SCr of 0.76 mg/dL).     []   [x]  Patient has received drugs known to prolong the QT intervals within the last 48 hour (examples: phenothiazines, tricyclics or tetracyclic antidepressants, macrolides, 1st generation H-1 antihistamines (especially diphenhydramine), fluoroquinolones, azoles, ondansetron , metoclopramide, promethazine).   Updated information on QT prolonging agents is available to be searched on the following database:QT prolonging agents -If SSRI or antihistamine needed, preferred options are sertraline  and loratadine respectively     []   [x]  Patient  received a dose of a thiazide diuretic in the last 48 hours [including hydrochlorothiazide (Oretic) alone or in any combination including triamterene (Dyazide, Maxzide)].    []   [x]  Patient received a medication known to increase dofetilide  plasma concentrations prior to initial dofetilide  dose:  Trimethoprim (Primsol, Proloprim) in the last 36 hours Verapamil  (Calan , Verelan ) in the last 36 hours or a sustained release dose in the last 72 hours Megestrol (Megace) in the last 5 days  Cimetidine (Tagamet) in the last 6 hours Ketoconazole (Nizoral) in the last 24 hours Itraconazole (Sporanox) in the last 48 hours  Prochlorperazine (Compazine) in the last 36 hours     []   [x]   Patient is known to have a history of torsades de pointes; congenital or acquired long QT syndromes.    []   [x]   Patient has received a Class 1 and Class 3 antiarrhythmic with less than 2 half-lives since last dose. (Disopyramide, Quinidine, Procainamide, Lidocaine , Mexiletine, Flecainide , Propafenone, Sotalol, Dronedarone)    []   [x]   Patient has received amiodarone  therapy in the past 3 months or amiodarone  level is greater than 0.3 ng/ml.    Labs:    Component Value Date/Time   K 3.6 11/15/2023 1246   K 3.8 11/23/2013 1245   MG 1.5 (L) 11/15/2023 1246   MG 2.1 05/28/2012 1054     Plan: Select One Calculated CrCl  Dose q12h  [x]  > 60 ml/min 500 mcg  []  40-60 ml/min 250 mcg  []  20-40 ml/min 125 mcg   [x]   Physician selected initial dose within range recommended for patients level of renal function - will monitor for response.  []   Physician selected initial dose outside  of range recommended for patients level of renal function - will discuss if the dose should be altered at this time.   Patient has been appropriately anticoagulated with Eliquis .  Potassium: K 3.5-3.7:  Hold Tikosyn  initiation and give KCl 60 mEq po x1 and repeat BMET 2hr after dose - repeat appropriate dose if K < 4     Magnesium : Mg 1.3-1.7: Hold Tikosyn  initiation and give Mg 4 gm x1 IV, recheck Mg 4hr after end of infusion - repeat appropriate dose if not > 1.8     Thank you for allowing pharmacy to participate in this patient's care   Angelo Kennedy Eulice Rutledge 11/15/2023  2:23 PM

## 2023-11-15 NOTE — ED Provider Notes (Signed)
 [place] EMERGENCY DEPARTMENT AT [facility] Provider Note  CSN: [id number] Arrival date & time: 11/15/23 1147  Chief Complaint(s) Chest Pain and Tachycardia  HPI Colleen Williamson is a 78 y.o. female history of atrial fibrillation, atrial flutter, prior stroke on Eliquis  presenting with rapid heart rate.  Patient reports that over the weekend, developed fast heart rate, chest pain, lightheadedness and dizziness.  Initially went to outside hospital, received adenosine  without improvement, started on a diltiazem  with improvement.  Did not take her Eliquis  that morning, was instructed to take it when she got home but just took the evening dose.  Also did not take her Eliquis  this morning.  Reports that she felt a little bit better after initial ER visit, but then the symptoms recurred yesterday and were persistent today so she went to cardiology office who sent her to the emergency department.  She reports currently the chest pain is resolved.  No fevers or chills, nausea or vomiting.  No abdominal pain.  Past Medical History Past Medical History:  Diagnosis Date  . Abnormal mammogram, unspecified 2014  . Anxiety   . Atypical atrial flutter (HCC)    a. 12/2014 s/p RFCA.  Aaron Aas History of cardiac cath    a. 03/2014 Cath [facility]): Nl cors.  . Mitral valve disorder    a. 03/2014 s/p MV repair [facility]); b. 06/2016 Echo: EF 55-60%, no rwma, Nl fxn'ing MV w/o stenosis. Mildly dil LA/RA.  Aaron Aas PAF (paroxysmal atrial fibrillation) (HCC)    a. 06/2016 s/p DCCV; b.  CHA2DS2VASc = 4-->Eliquis . Rhythm mgmt w/ amio (prev intol to flecainide ).  . PSVT (paroxysmal supraventricular tachycardia) (HCC)   . Stroke [facility])    a. 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   Patient Active Problem List   Diagnosis Date Noted  . Heart palpitations 03/16/2019  . Chronic venous insufficiency 03/16/2019  . Lymphedema 03/16/2019  . Typical atrial flutter (HCC) 04/19/2018  . Need for immunization against  influenza 04/19/2018  . Pure hypercholesterolemia 12/31/2017  . Elevated troponin   . Near syncope 07/11/2016  . Atrial fibrillation, rapid (HCC) 07/11/2016  . Dizziness 11/20/2015  . Anxiety 08/20/2015  . Depression 08/20/2015  . Supraventricular tachycardia (HCC) 05/01/2015  . HTN, goal below 140/90 04/30/2015  . Fatigue 12/13/2014  . History of heart valve repair 09/20/2014  . Cardioembolic stroke (HCC) 07/12/2014  . Cerebral infarction (HCC) 07/03/2014  . Cerebrovascular accident (CVA) (HCC) 07/03/2014  . Stroke (HCC)   . Mitral valve prolapse 06/13/2014  . Paroxysmal atrial fibrillation (HCC) 05/14/2014  . Limb pain 04/20/2014  . Unspecified atrial flutter (HCC) 04/12/2014  . Cyst of breast 01/03/2013   Home Medication(s) Prior to Admission medications   Medication Sig Start Date End Date Taking? Authorizing Provider  amoxicillin (AMOXIL) 500 MG capsule Take 2,000 mg by mouth See admin instructions. Take 4 capsules (2000 mg) by mouth 1 hour prior to dental appointments.    [provider]  apixaban  (ELIQUIS ) 5 MG TABS tablet Take 1 tablet (5 mg total) by mouth 2 (two) times daily. 12/01/21   Gollan, Timothy J, MD  carvedilol  (COREG ) 3.125 MG tablet Take 3.125 mg by mouth 2 (two) times daily with a meal. 09/22/23   [provider]  ipratropium (ATROVENT) 0.03 % nasal spray Place 1 spray into both nostrils daily as needed (allergies/congestion.). 09/04/20 11/15/23  [provider]  rosuvastatin (CRESTOR) 10 MG tablet Take 10 mg by mouth in the morning. 03/06/22   [provider]                                                                                                                                    Past Surgical History Past Surgical History:  Procedure Laterality Date  . ABDOMINAL HYSTERECTOMY  2004  . ATRIAL FIBRILLATION ABLATION N/A 06/10/2023   Procedure: ATRIAL FIBRILLATION ABLATION;  Surgeon: Boyce Byes, MD;  Location: [facility]  INVASIVE CV LAB;  Service: Cardiovascular;  Laterality: N/A;  . BREAST CYST ASPIRATION  15 YRS AGO  . CARDIOVERSION N/A 09/21/2014   Procedure: CARDIOVERSION;  Surgeon: Loyde Rule, MD;  [facility] ENDOSCOPY;  Service: Cardiovascular;  Laterality: N/A;  . CARDIOVERSION N/A 02/26/2023   Procedure: CARDIOVERSION;  Surgeon: Devorah Fonder, MD;  Location: [facility] ORS;  Service: Cardiovascular;  Laterality: N/A;  . COLONOSCOPY WITH PROPOFOL  N/A 05/15/2020   Procedure: COLONOSCOPY WITH PROPOFOL ;  Surgeon: Toledo, Alphonsus Jeans, MD;  Location: [facility] ENDOSCOPY;  Service: Endoscopy;  Laterality: N/A;  . ELECTROPHYSIOLOGIC STUDY N/A 11/28/2014   Procedure: Cardioversion;  Surgeon: Ronney Cola, MD;  Location: [facility] INVASIVE CV LAB;  Service: Cardiovascular;  Laterality: N/A;  . ELECTROPHYSIOLOGIC STUDY N/A 12/27/2014   Procedure: A-Flutter;  Surgeon: Tammie Fall, MD;  Location[facility] INVASIVE CV LAB;  Service: Cardiovascular;  Laterality: N/A;  . ELECTROPHYSIOLOGIC STUDY N/A 11/28/2014   Procedure: Cardioversion;  Surgeon: Ronney Cola, MD;  Location: [facility] ORS;  Service: Cardiovascular;  Laterality: N/A;  Have discussed with Dr. Andy Bannister  . ELECTROPHYSIOLOGIC STUDY N/A 07/13/2016   Procedure: Cardioversion;  Surgeon: Wenona Hamilton, MD;  Location: [facility] ORS;  Service: Cardiovascular;  Laterality: N/A;  . EYE SURGERY  2007  . MITRAL VALVE REPAIR  05/2014  . RADIOLOGY WITH ANESTHESIA N/A 07/03/2014   Procedure: RADIOLOGY WITH ANESTHESIA;  Surgeon: Bronson Canny, MD;  Location: [facility] OR;  Service: Radiology;  Laterality: N/A;   Family History Family History  Problem Relation Age of Onset  . Cancer Father   . Cancer Other        BOTH SIDES OF THE FAMILY  . Diabetes Other        MATERNAL SIDE OF THE FAMILY  . Breast cancer Maternal Aunt     Social History Social History   Tobacco Use  . Smoking status: Former  . Smokeless tobacco: Never  . Tobacco comments:    quit smoking in her 20's  Vaping Use   . Vaping status: Former  Substance Use Topics  . Alcohol use: No    Alcohol/week: 0.0 standard drinks of alcohol  . Drug use: No   Allergies Flecainide  and Sulfa antibiotics  Review of Systems Review of Systems  All other systems reviewed and are negative.   Physical Exam Vital Signs  I have reviewed the triage vital signs BP (!) 153/98   Pulse (!) 163   Temp 98 F (36.7 C) (Oral)  Resp 16   SpO2 100%  Physical Exam Vitals and nursing note reviewed.  Constitutional:      General: She is not in acute distress.    Appearance: She is well-developed.  HENT:     Head: Normocephalic and atraumatic.     Mouth/Throat:     Mouth: Mucous membranes are moist.  Eyes:     Pupils: Pupils are equal, round, and reactive to light.  Cardiovascular:     Rate and Rhythm: Regular rhythm. Tachycardia present.     Heart sounds: No murmur heard. Pulmonary:     Effort: Pulmonary effort is normal. No respiratory distress.     Breath sounds: Normal breath sounds.  Abdominal:     General: Abdomen is flat.     Palpations: Abdomen is soft.     Tenderness: There is no abdominal tenderness.  Musculoskeletal:        General: No tenderness.     Right lower leg: No edema.     Left lower leg: No edema.  Skin:    General: Skin is warm and dry.  Neurological:     General: No focal deficit present.     Mental Status: She is alert. Mental status is at baseline.  Psychiatric:        Mood and Affect: Mood normal.        Behavior: Behavior normal.     ED Results and Treatments Labs (all labs ordered are listed, but only abnormal results are displayed) Labs Reviewed  COMPREHENSIVE METABOLIC PANEL WITH GFR  CBC WITH DIFFERENTIAL/PLATELET  MAGNESIUM   TROPONIN I (HIGH SENSITIVITY)                                                                                                                          Radiology No results found.  Pertinent labs & imaging results that were available during  my care of the patient were reviewed by me and considered in my medical decision making (see MDM for details).  Medications Ordered in ED Medications  apixaban  (ELIQUIS ) tablet 5 mg (has no administration in time range)  diltiazem  (CARDIZEM ) 1 mg/mL load via infusion 10 mg (has no administration in time range)    And  diltiazem  (CARDIZEM ) 125 mg in dextrose  5% 125 mL (1 mg/mL) infusion (has no administration in time range)  Procedures Procedures  (including critical care time)  Medical Decision Making / ED Course   MDM:  78 year old presenting to the emergency department with rapid heartbeat.  Suspect atrial flutter, on review of chart, patient had adenosine  in outside hospital, which revealed flutter waves.  Do not think it would be useful to repeat this today.  Will give diltiazem .  Will give missed dose of Eliquis .  Would consider cardioversion, however the patient missed 2 doses of Eliquis  and has had a prior stroke in the setting of atrial fibrillation so think it would be relatively dangerous to proceed with electrical cardioversion.  Will discuss with cardiology.  Clinical Course as of 11/15/23 1242  Mon Nov 15, 2023  1231 Discussed with cardiology who will consult [WS]    Clinical Course User Index [WS] Mordecai Applebaum, MD     Additional history obtained: -Additional history obtained from {wsadditionalhistorian:28072} -External records from outside source obtained and reviewed including: Chart review including previous notes, labs, imaging, consultation notes including ***   Lab Tests: -I ordered, reviewed, and interpreted labs.   The pertinent results include:   Labs Reviewed  COMPREHENSIVE METABOLIC PANEL WITH GFR  CBC WITH DIFFERENTIAL/PLATELET  MAGNESIUM   TROPONIN I (HIGH SENSITIVITY)    Notable for ***  EKG   EKG  Interpretation Date/Time:  Monday November 15 2023 12:01:41 EDT Ventricular Rate:  163 PR Interval:  40 QRS Duration:  92 QT Interval:  297 QTC Calculation: 490 R Axis:   191  Text Interpretation: Atrial tachycardia Probable right ventricular hypertrophy ST depression, probably rate related Confirmed by Hiawatha Lout ([id number]) on 11/15/2023 12:11:54 PM         Imaging Studies ordered: I ordered imaging studies including *** On my interpretation imaging demonstrates *** I independently visualized and interpreted imaging. I agree with the radiologist interpretation   Medicines ordered and prescription drug management: Meds ordered this encounter  Medications  . apixaban  (ELIQUIS ) tablet 5 mg  . AND Linked Order Group   . diltiazem  (CARDIZEM ) 1 mg/mL load via infusion 10 mg   . diltiazem  (CARDIZEM ) 125 mg in dextrose  5% 125 mL (1 mg/mL) infusion    -I have reviewed the patients home medicines and have made adjustments as needed   Consultations Obtained: I requested consultation with the ***,  and discussed lab and imaging findings as well as pertinent plan - they recommend: ***   Cardiac Monitoring: The patient was maintained on a cardiac monitor.  I personally viewed and interpreted the cardiac monitored which showed an underlying rhythm of: ***  Social Determinants of Health:  Diagnosis or treatment significantly limited by social determinants of health: {wssoc:28071}   Reevaluation: After the interventions noted above, I reevaluated the patient and found that their symptoms have {resolved/improved/worsened:23923::"improved"}  Co morbidities that complicate the patient evaluation . Past Medical History:  Diagnosis Date  . Abnormal mammogram, unspecified 2014  . Anxiety   . Atypical atrial flutter (HCC)    a. 12/2014 s/p RFCA.  Aaron Aas History of cardiac cath    a. 03/2014 Cath [facility]): Nl cors.  . Mitral valve disorder    a. 03/2014 s/p MV repair [facility]); b. 06/2016 Echo:  EF 55-60%, no rwma, Nl fxn'ing MV w/o stenosis. Mildly dil LA/RA.  Aaron Aas PAF (paroxysmal atrial fibrillation) (HCC)    a. 06/2016 s/p DCCV; b.  CHA2DS2VASc = 4-->Eliquis . Rhythm mgmt w/ amio (prev intol to flecainide ).  . PSVT (paroxysmal supraventricular tachycardia) (HCC)   . Stroke [facility])    a.  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      Dispostion: Disposition decision including need for hospitalization was considered, and patient {wsdispo:28070::"discharged from emergency department."}    Final Clinical Impression(s) / ED Diagnoses Final diagnoses:  None     This chart was dictated using voice recognition software.  Despite best efforts to proofread,  errors can occur which can change the documentation meaning.

## 2023-11-15 NOTE — ED Notes (Signed)
 Pt stated that she was cold.  Facilities contacted to increase the temperature of the heat for her room.

## 2023-11-15 NOTE — H&P (Addendum)
 ELECTROPHYSIOLOGY CONSULT NOTE    Patient ID: Colleen Williamson MRN: [id number], DOB/AGE: 1945/10/01 78 y.o.  Admit date: 11/15/2023 Date of Consult: 11/15/2023  Primary Physician: Yehuda Helms, MD Primary Cardiologist: Belva Boyden, MD  Electrophysiologist: Dr. Marven Slimmer   Referring Provider: Dr. Isaiah Marc  Patient Profile: Colleen Williamson is a 78 y.o. female with a history of MV repair in 2016 complicated by CVA, developed subsequent AFL and required DCCV x2, persistent AF, & HTN who is being seen today for the evaluation of AFL at the request of Dr. Isaiah Marc.   HPI:  Colleen Williamson is a 78 y.o. female who presented to the ER on 4/21 from the EP Clinic with palpitations, lightheadedness, shortness of breath and hypotension.  She was seen in the ER with palpitations on 11/13/23 and was treated with adenosine  and did not cardiovert, no clear comment on underlying rhythm though follow up EKG felt to be AF.  She was treated with IV diltiazem  that slowed and was eventually documented as SR > back to AF.  She did not take her medications Eliquis /coreg  that day as she suspected she might need a procedure and held them.  In ER, she was given her coreg  and instructed to take her Eliquis  when she got home.   She returned to the EP Clinic on 4/21 for follow up and was hypotensive, lightheaded with palpitations. EKG showed SVT.  She held her Eliquis  again am of 4/21 thinking she might need a procedure. She was referred to the ER for evaluation.   She reports she is very compliant with medications. Two family members present in room.  She previously tolerated amiodarone  well but it was stopped prior to ablation for rhythm identification. She has never taken Tikosyn .  She confirms she missed her am Eliquis  dose 4/19 and am 4/21 of eliquis  in the event she needed a procedure. She was given her am dose of Eliquis  in the ER.  She was treated with cardizem  and ventricular rates slowed to 80's.    She denies chest pain, palpitations, dyspnea, PND, orthopnea, nausea, vomiting, dizziness, syncope, edema, weight gain, or early satiety.   Labs Potassium4.1 (04/19 4098)   Creatinine, ser  0.71 (04/19 0926) PLT  232 (04/19 0926) HGB  16.4* (04/19 0926) WBC 5.7 (04/19 0926) Troponin I (High Sensitivity)11 (04/19 1106).    Past Medical History:  Diagnosis Date   Abnormal mammogram, unspecified 2014   Anxiety    Atypical atrial flutter (HCC)    a. 12/2014 s/p RFCA.   History of cardiac cath    a. 03/2014 Cath [facility]): Nl cors.   Mitral valve disorder    a. 03/2014 s/p MV repair [facility]); b. 06/2016 Echo: EF 55-60%, no rwma, Nl fxn'ing MV w/o stenosis. Mildly dil LA/RA.   PAF (paroxysmal atrial fibrillation) (HCC)    a. 06/2016 s/p DCCV; b.  CHA2DS2VASc = 4-->Eliquis . Rhythm mgmt w/ amio (prev intol to flecainide ).   PSVT (paroxysmal supraventricular tachycardia) (HCC)    Stroke (HCC)    a. 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     Surgical History:  Past Surgical History:  Procedure Laterality Date   ABDOMINAL HYSTERECTOMY  2004   ATRIAL FIBRILLATION ABLATION N/A 06/10/2023   Procedure: ATRIAL FIBRILLATION ABLATION;  Surgeon: Boyce Byes, MD;  [facility] INV[facility] CV LAB;  Service: Cardiovascular;  Laterality: N/A;   BREAST CYST ASPIRATION  15 YRS AGO   CARDIOVERSION N/A 09/21/2014   Procedure: CARDIOVERSION;  Surgeon: Loyde Rule, MD;  Location: [facility] ENDOSCOPY;  Service: Cardiovascular;  Laterality: N/A;   CARDIOVERSION N/A 02/26/2023   Procedure: CARDIOVERSION;  Surgeon: Devorah Fonder, MD;  Location: [facility] ORS;  Service: Cardiovascular;  Laterality: N/A;   COLONOSCOPY WITH PROPOFOL  N/A 05/15/2020   Procedure: COLONOSCOPY WITH PROPOFOL ;  Surgeon: Toledo, Alphonsus Jeans, MD;  Location: [facility] ENDOSCOPY;  Service: Endoscopy;  Laterality: N/A;   ELECTROPHYSIOLOGIC STUDY N/A 11/28/2014   Procedure: Cardioversion;  Surgeon: Ronney Cola, MD;  Location: [facility] INVASIVE CV  LAB;  Service: Cardiovascular;  Laterality: N/A;   ELECTROPHYSIOLOGIC STUDY N/A 12/27/2014   Procedure: A-Flutter;  Surgeon: Tammie Fall, MD;  Location: [facility] INVASIVE CV LAB;  Service: Cardiovascular;  Laterality: N/A;   ELECTROPHYSIOLOGIC STUDY N/A 11/28/2014   Procedure: Cardioversion;  Surgeon: Ronney Cola, MD;  Location: [facility] ORS;  Service: Cardiovascular;  Laterality: N/A;  Have discussed with Dr. Andy Bannister   ELECTROPHYSIOLOGIC STUDY N/A 07/13/2016   Procedure: Cardioversion;  Surgeon: Wenona Hamilton, MD;  Location: [facility] ORS;  Service: Cardiovascular;  Laterality: N/A;   EYE SURGERY  2007   MITRAL VALVE REPAIR  05/2014   RADIOLOGY WITH ANESTHESIA N/A 07/03/2014   Procedure: RADIOLOGY WITH ANESTHESIA;  Surgeon: Bronson Canny, MD;  Location: [facility] OR;  Service: Radiology;  Laterality: N/A;     (Not in a hospital admission)   Inpatient Medications:   apixaban   5 mg Oral Once   diltiazem   10 mg Intravenous Once    Allergies:  Allergies  Allergen Reactions   Flecainide      Funny taste; hairloss   Sulfa Antibiotics Nausea Only    Family History  Problem Relation Age of Onset   Cancer Father    Cancer Other        BOTH SIDES OF THE FAMILY   Diabetes Other        MATERNAL SIDE OF THE FAMILY   Breast cancer Maternal Aunt      Physical Exam: Vitals:   11/15/23 1155 11/15/23 1208  BP: (!) 153/98   Pulse: (!) 163   Resp: 16   Temp:  98 F (36.7 C)  TempSrc:  Oral  SpO2: 100%     GEN- pleasant adult female in NAD, A&O x 3, normal affect HEENT: Normocephalic, atraumatic Lungs- CTAB, Normal effort.  Heart- Regular rate and rhythm (in AFL, V-rates 80's, No M/G/R.  GI- Soft, NT, ND.  Extremities- No clubbing, cyanosis, or edema   Radiology/Studies: DG Chest Portable 1 View Result Date: 11/13/2023 CLINICAL DATA:  Cough.  Recent ablation for atrial fibrillation. EXAM: PORTABLE CHEST 1 VIEW COMPARISON:  05/01/2015 FINDINGS: Previous median sternotomy and mitral valve  surgery. Defibrillator pads overlie the region. Heart size is normal. Aortic atherosclerotic calcification is seen. The lungs are clear. The vascularity is normal. No effusions. No acute bone finding. IMPRESSION: No active disease. Previous median sternotomy and mitral valve surgery. Aortic atherosclerotic calcification. Electronically Signed   By: Bettylou Brunner M.D.   On: 11/13/2023 11:55   MM 3D DIAGNOSTIC MAMMOGRAM UNILATERAL RIGHT BREAST Result Date: 11/10/2023 CLINICAL DATA:  RIGHT breast asymmetry EXAM: DIGITAL DIAGNOSTIC UNILATERAL RIGHT MAMMOGRAM WITH TOMOSYNTHESIS AND CAD; ULTRASOUND RIGHT BREAST LIMITED TECHNIQUE: Right digital diagnostic mammography and breast tomosynthesis was performed. The images were evaluated with computer-aided detection. ; Targeted ultrasound examination of the right breast was performed COMPARISON:  Previous exam(s). ACR Breast Density Category b: There are scattered areas of fibroglandular density. FINDINGS: Spot compression tomosynthesis views demonstrate a persistent low-density  oval circumscribed mass in the RIGHT slightly outer breast at middle depth. On physical exam, no suspicious mass is appreciated. Targeted ultrasound was performed of the RIGHT breast. At 9 o'clock 2 cm from the nipple, there is an oval circumscribed anechoic mass with posterior acoustic enhancement. It measures 6 x 7 x 5 mm and is consistent with a benign simple cyst. IMPRESSION: There is a benign cyst at the site of screening mammographic concern. RECOMMENDATION: Screening mammogram in one year.(Code:SM-B-01Y) I have discussed the findings and recommendations with the patient. If applicable, a reminder letter will be sent to the patient regarding the next appointment. BI-RADS CATEGORY  2: Benign. Electronically Signed   By: Clancy Crimes M.D.   On: 11/10/2023 10:04   US  LIMITED ULTRASOUND INCLUDING AXILLA RIGHT BREAST Result Date: 11/10/2023 CLINICAL DATA:  RIGHT breast asymmetry EXAM:  DIGITAL DIAGNOSTIC UNILATERAL RIGHT MAMMOGRAM WITH TOMOSYNTHESIS AND CAD; ULTRASOUND RIGHT BREAST LIMITED TECHNIQUE: Right digital diagnostic mammography and breast tomosynthesis was performed. The images were evaluated with computer-aided detection. ; Targeted ultrasound examination of the right breast was performed COMPARISON:  Previous exam(s). ACR Breast Density Category b: There are scattered areas of fibroglandular density. FINDINGS: Spot compression tomosynthesis views demonstrate a persistent low-density oval circumscribed mass in the RIGHT slightly outer breast at middle depth. On physical exam, no suspicious mass is appreciated. Targeted ultrasound was performed of the RIGHT breast. At 9 o'clock 2 cm from the nipple, there is an oval circumscribed anechoic mass with posterior acoustic enhancement. It measures 6 x 7 x 5 mm and is consistent with a benign simple cyst. IMPRESSION: There is a benign cyst at the site of screening mammographic concern. RECOMMENDATION: Screening mammogram in one year.(Code:SM-B-01Y) I have discussed the findings and recommendations with the patient. If applicable, a reminder letter will be sent to the patient regarding the next appointment. BI-RADS CATEGORY  2: Benign. Electronically Signed   By: Clancy Crimes M.D.   On: 11/10/2023 10:04   MM 3D SCREENING MAMMOGRAM BILATERAL BREAST Result Date: 11/05/2023 CLINICAL DATA:  Screening. EXAM: DIGITAL SCREENING BILATERAL MAMMOGRAM WITH TOMOSYNTHESIS AND CAD TECHNIQUE: Bilateral screening digital craniocaudal and mediolateral oblique mammograms were obtained. Bilateral screening digital breast tomosynthesis was performed. The images were evaluated with computer-aided detection. COMPARISON:  Previous exam(s). ACR Breast Density Category b: There are scattered areas of fibroglandular density. FINDINGS: In the right breast, a possible asymmetry warrants further evaluation. In the left breast, no findings suspicious for malignancy.  IMPRESSION: Further evaluation is suggested for possible asymmetry in the right breast. RECOMMENDATION: Diagnostic mammogram and possibly ultrasound of the right breast. (Code:[id number]-72M) The patient will be contacted regarding the findings, and additional imaging will be scheduled. BI-RADS CATEGORY  0: Incomplete: Need additional imaging evaluation. Electronically Signed   By: Sande Cromer M.D.   On: 11/05/2023 15:09    EKG: SVT, likely AFL (personally reviewed)  TELEMETRY: AFL 80's (personally reviewed)  DEVICE HISTORY: n/a  Studies:  EPS 12/2014 > isthmus-dependent right atrial flutter upon presentation, successful RF ablation of AF along the CTI with bidirectional isthmus block ECHO 2017 > LVEF 55-60% CT Cardiac Morphology 04/2023 > normal PV drainage into the LA, large chicken wing type LAA, CCS 0  EPS 06/10/2023 > PVI, posterior wall, peri-mitral flutters ablated     Arrhythmia / AAD Atypical Atrial Flutter  Typical Atrial Flutter  Persistent Atrial Fibrillation  DCCV > 11/2014   Assessment/Plan:  SVT   Typical & Atypical Atrial Flutter  Persistent Atrial Fibrillation  S/p PVI,  CTI ablation  -continue OAC for stroke prophylaxis  -continue cardizem  gtt for now -hold Coreg  3.125 mg BID for now given soft pressures on presentation -plan for TEE with DCCV given delayed dose of Eliquis  (received am dose 4/21) -admit for Tikosyn  loading > Cr CL 49mL/min, prior QTc in SR , medications reviewed and no offending agents, K+ 3.6 / Mg+ 1.5   Secondary Hypercoagulable State  -continue Eliquis  5mg  BID, dose reviewed and appropriate by age/wt   Hypertension  -well controlled on current regimen   -BP improved with rate control   Electrolyte Disturbance: Hypokalemia, Hypomagnesemia  -replacement  -follow daily while on Tikosyn       For questions or updates, please contact [facility] Please consult [email or web address] for contact info under Cardiology/STEMI.  Signed, Creighton Doffing, NP-C, AGACNP-BC [facility] - Electrophysiology  11/15/2023, 12:29 PM

## 2023-11-15 NOTE — ED Triage Notes (Signed)
 Pt BIB [facility] from cardiovascular center d/t chest pressure & heaviness, with some dizziness. EMS reports A-Fib, A/Ox4, ST at 163 bpm, received 324 ASA, 118/98, 96% on RA, CBG 111, denies nausea.

## 2023-11-15 NOTE — Telephone Encounter (Signed)
 Patient c/o Palpitations: STAT if patient c/o lightheadedness, shortness of breath, or chest pain  How long have you had palpitations/irregular HR/ Afib? Are you having the symptoms now? Has a h/x of Afib, post ablation in Nov. Still having the sx of palpitations/aflutter.  Are you currently experiencing lightheadedness, SOB or CP? Lightheadedness and sob   Do you have a history of afib (atrial fibrillation) or irregular heart rhythm? yes  Have you checked your BP or HR? (document readings if available): not this morning  Are you experiencing any other symptoms?  Lightheadedness prone to fainting, she is nauseous (hasn't been eating d/t symptoms making her sick on her stomach)   Patients daughter states patient was in the hospital over the weekend for what they thought was SVT, turned out to be A-Flutter. Patient was given adenosine  which stopped the a-flutter momentarily but has since been having symptoms. Patients daughter, Peterson Brandt, wants patient seen asap as she is concerned about her, stating she is a 'limp dishrag'. Advised the patients daughter that Dr. Marven Slimmer is not in the office in [place] today, patients daughter would like a specialist to see patient and lay eyes on her due to her symptoms because she is worried she may need a cardioversion. Call transferred to triage.

## 2023-11-15 NOTE — Telephone Encounter (Signed)
 I spoke with the patient's daughter, who reported that the patient was seen in the ER on 11/13/23 for what was initially believed to be SVT. The daughter stated that the patient was given adenosine  but was later found to be in atrial flutter. She noted that the ED physician recommended increasing carvedilol  to 3.125 mg by mouth, BID, and following up with a cardiologist. However, the daughter reported that despite the dosage increase, the patient continues to feel like her heart is racing, experiences shortness of breath, and feels lightheaded. The daughter mentioned that these symptoms typically start in the morning.  An appointment has been scheduled for 10:30 AM with Mertha Abrahams for further evaluation. The patient's daughter has also been informed of the ED precautions to take if any new symptoms develop or worsen.

## 2023-11-15 NOTE — Patient Instructions (Signed)
 To go to hospital for further analysis

## 2023-11-15 NOTE — ED Notes (Signed)
 The patient's family requested that the heat be turned down in her room.  Facilties was contacted to have the heat turned down in the room.

## 2023-11-16 ENCOUNTER — Inpatient Hospital Stay (HOSPITAL_COMMUNITY)

## 2023-11-16 ENCOUNTER — Other Ambulatory Visit (HOSPITAL_COMMUNITY): Payer: Self-pay

## 2023-11-16 ENCOUNTER — Encounter (HOSPITAL_COMMUNITY): Payer: Self-pay | Admitting: Cardiology

## 2023-11-16 ENCOUNTER — Other Ambulatory Visit: Payer: Self-pay

## 2023-11-16 ENCOUNTER — Telehealth (HOSPITAL_COMMUNITY): Payer: Self-pay | Admitting: Pharmacy Technician

## 2023-11-16 ENCOUNTER — Inpatient Hospital Stay (HOSPITAL_COMMUNITY)
Admit: 2023-11-16 | Discharge: 2023-11-16 | Disposition: A | Discharge status: Home / Self Care | Attending: Cardiology | Admitting: Cardiology

## 2023-11-16 ENCOUNTER — Encounter (HOSPITAL_COMMUNITY)
Admission: EM | Disposition: A | Discharge status: Home / Self Care | Payer: Self-pay | Source: Home / Self Care | Attending: Cardiology

## 2023-11-16 DIAGNOSIS — I340 Nonrheumatic mitral (valve) insufficiency: Secondary | ICD-10-CM | POA: Diagnosis not present

## 2023-11-16 DIAGNOSIS — I4892 Unspecified atrial flutter: Secondary | ICD-10-CM

## 2023-11-16 DIAGNOSIS — I484 Atypical atrial flutter: Secondary | ICD-10-CM

## 2023-11-16 DIAGNOSIS — I639 Cerebral infarction, unspecified: Secondary | ICD-10-CM

## 2023-11-16 DIAGNOSIS — I10 Essential (primary) hypertension: Secondary | ICD-10-CM

## 2023-11-16 DIAGNOSIS — D6859 Other primary thrombophilia: Secondary | ICD-10-CM

## 2023-11-16 DIAGNOSIS — E876 Hypokalemia: Secondary | ICD-10-CM | POA: Diagnosis not present

## 2023-11-16 DIAGNOSIS — F418 Other specified anxiety disorders: Secondary | ICD-10-CM

## 2023-11-16 HISTORY — PX: CARDIOVERSION: EP1203

## 2023-11-16 HISTORY — PX: TRANSESOPHAGEAL ECHOCARDIOGRAM (CATH LAB): EP1270

## 2023-11-16 LAB — BASIC METABOLIC PANEL WITH GFR
Anion gap: 5 (ref 5–15)
BUN: 21 mg/dL (ref 8–23)
CO2: 24 mmol/L (ref 22–32)
Calcium: 9 mg/dL (ref 8.9–10.3)
Chloride: 110 mmol/L (ref 98–111)
Creatinine, Ser: 0.74 mg/dL (ref 0.44–1.00)
GFR, Estimated: >60 mL/min (ref >60)
Glucose, Bld: 95 mg/dL (ref 70–99)
Potassium: 4.3 mmol/L (ref 3.5–5.1)
Sodium: 139 mmol/L (ref 135–145)

## 2023-11-16 LAB — ECHO TEE

## 2023-11-16 LAB — MAGNESIUM: Magnesium: 2.2 mg/dL (ref 1.7–2.4)

## 2023-11-16 MED ORDER — DOFETILIDE 250 MCG PO CAPS
250.0000 ug | ORAL_CAPSULE | Freq: Two times a day (BID) | ORAL | Status: DC
  Administered 2023-11-16 – 2023-11-19 (×6): 250 ug via ORAL
  Filled 2023-11-16 (×6): qty 1

## 2023-11-16 MED ORDER — LIDOCAINE HCL (CARDIAC) PF 100 MG/5ML IV SOSY
PREFILLED_SYRINGE | INTRAVENOUS | Status: DC | PRN
Start: 2023-11-16 — End: 2023-11-16
  Administered 2023-11-16: 60 mg via INTRAVENOUS

## 2023-11-16 MED ORDER — SODIUM CHLORIDE 0.9% FLUSH: 3.0000 mL | INTRAVENOUS | Status: DC | PRN

## 2023-11-16 MED ORDER — PROPOFOL 10 MG/ML IV BOLUS
INTRAVENOUS | Status: DC | PRN
  Administered 2023-11-16: 30 mg via INTRAVENOUS
  Administered 2023-11-16: 70 mg via INTRAVENOUS
  Administered 2023-11-16: 20 mg via INTRAVENOUS

## 2023-11-16 MED ORDER — SODIUM CHLORIDE 0.9% FLUSH: 3.0000 mL | Freq: Two times a day (BID) | INTRAVENOUS | Status: DC

## 2023-11-16 MED ORDER — PHENYLEPHRINE HCL (PRESSORS) 10 MG/ML IV SOLN
INTRAVENOUS | Status: DC | PRN
  Administered 2023-11-16: 80 ug via INTRAVENOUS

## 2023-11-16 MED ORDER — PROPOFOL 500 MG/50ML IV EMUL
INTRAVENOUS | Status: DC | PRN
  Administered 2023-11-16: 70 ug/kg/min via INTRAVENOUS

## 2023-11-16 NOTE — Progress Notes (Addendum)
  Patient Name: Colleen Williamson Date of Encounter: 11/16/2023  Primary Cardiologist: Timothy Gollan, MD Electrophysiologist: CAMERON T LAMBERT, MD  Interval Summary   The patient is doing well today.  She indicates understanding of pending procedure and asks questions regarding Tikosyn .    At this time, the patient denies chest pain, shortness of breath, or any new concerns.  Vital Signs    Vitals:   11/16/23 0211 11/16/23 0400 11/16/23 0547 11/16/23 0715  BP:  122/87  114/81  Pulse:  81 (!) 49 82  Resp:  11 (!) 24 16  Temp: (!) 97.5 F (36.4 C)  97.9 F (36.6 C) 97.8 F (36.6 C)  TempSrc: Oral  Oral Oral  SpO2:  97% 96% 96%    Intake/Output Summary (Last 24 hours) at 11/16/2023 0801 Last data filed at 11/15/2023 2155 Gross per 24 hour  Intake 240 ml  Output --  Net 240 ml   There were no vitals filed for this visit.  Physical Exam    GEN- The patient is well appearing, alert and oriented x 3 today.   Lungs- Clear to ausculation bilaterally, normal work of breathing Cardiac- Regular rate and rhythm (in AFL), no murmurs, rubs or gallops GI- soft, NT, ND, + BS Extremities- no clubbing or cyanosis. No edema  Telemetry    AFL 80 -110 with occ PVC's (personally reviewed)   Studies:  EPS 12/2014 > isthmus-dependent right atrial flutter upon presentation, successful RF ablation of AF along the CTI with bidirectional isthmus block ECHO 2017 > LVEF 55-60% CT Cardiac Morphology 04/2023 > normal PV drainage into the LA, large chicken wing type LAA, CCS 0  EPS 06/10/2023 > PVI, posterior wall, peri-mitral flutters ablated      Arrhythmia / AAD Atypical Atrial Flutter  Typical Atrial Flutter  Persistent Atrial Fibrillation  DCCV > 11/2014    Hospital Course    Colleen Williamson is a 78 y.o. female  with a history of MV repair in 2016 complicated by CVA, developed subsequent AFL and required DCCV x2, persistent AF, & HTN admitted 4/21 for AFL with hemodynamic compromise.  She was sent from EP Clinic for admission. The patient reported lightheadedness, palpitations, shortness of breath and was hypotensive. Initial EKG consistent with SVT. She was started on IV cardizem  and ventricular rates were improved to the 80's. She was admitted for TEE/DCCV and Tikosyn .  The patient missed one dose of Eliquis  on 11/13/23.  None since but was delayed dosing on am of 4/21.   Assessment & Plan    SVT   Typical & Atypical Atrial Flutter  Persistent Atrial Fibrillation  S/p PVI, CTI ablation. Hx of Mitral Valve repair. -OAC for stroke prophylaxis  -continue cardizem  gtt for now  -hold home Coreg  for now, SBP 114-122 overnight -plan for TEE/DCCV prior to Tikosyn  to review for clot given missed dose of OAC, and to avoid hopefully avoid two procedures for patient -pending Tikosyn  loading this evening  -if fails Tikosyn , amiodarone  would be her next option  Secondary Hypercoagulable State  -continue Eliquis  5mg  BID, dose reviewed and appropriate by age/wt   Electrolyte Disturbance: Hypokalemia, Hypomagensemia  -replacement per pharmacy  -follow electrolytes closely while on Tikosyn      For questions or updates, please contact [facility] Please consult [email or web address] for contact info under Cardiology/STEMI.  Signed, Creighton Doffing, NP-C, AGACNP-BC [facility] - Electrophysiology  11/16/2023, 8:01 AM

## 2023-11-16 NOTE — Telephone Encounter (Signed)
 Patient Product/process development scientist completed.    The patient is insured through [employer]. Patient has Medicare and is not eligible for a copay card, but may be able to apply for patient assistance or Medicare RX Payment Plan (Patient Must reach out to their plan, if eligible for payment plan), if available.    Ran test claim for dofetilide (Tikosyn) 500 mcg and the current 30 day co-pay is $50.83.   This test claim was processed through [facility]- copay amounts may vary at other pharmacies due to pharmacy/plan contracts, or as the patient moves through the different stages of their insurance plan.     Roland Earl, CPHT Pharmacy Technician III Certified Patient Advocate [facility] Patient Advocate Team Direct Number: [phone]  Fax: [phone]

## 2023-11-16 NOTE — CV Procedure (Signed)
     PROCEDURE NOTE:  Procedure:  Transesophageal echocardiogram Operator:  Gaylyn Keas, MD Indications: Atypical Atrial Flutter Complications: None  During this procedure the patient is administered a total of Propofol  180 mg and Lidocaine  60 mg to achieve and maintain moderate conscious sedation.  The patient's heart rate, blood pressure, and oxygen saturation are monitored continuously during the procedure. The period of conscious sedation is 12 minutes, of which I was present face-to-face 100% of this time. Joanie Mt CRNA is an independent, trained observer who assisted in the monitoring of the patient's level of consciousness.    Results: Normal LV size with mild to moderate LV dysfunction EF 40-45% Normal RV size and function Normal RA Dilated LA and very large LA appendage.  There is spontaneous echo contrast in the body of the LA.  No thrombus in the LA or LAA Normal TV with trivial TR Normal PV with trivial PR S/P Mitral Valve repair.  The posterior mitral valve leaflet appears fixed.  There is mild MR Trileaflet AV with mild AV sclerosis with no stenosis.   Normal interatrial septum with no evidence of shunt by colorflow dopper  Normal thoracic and ascending aorta.  The patient went on to DCCV  Electrical Cardioversion Procedure Note Colleen Williamson [id number] 04/26/1946  Procedure: Electrical Cardioversion Indications:  Atrial Fibrillation  Time Out: Verified patient identification, verified procedure,medications/allergies/relevent history reviewed, required imaging and test results available.  Performed  Procedure Details  Cardioversion was done with synchronized biphasic defibrillation with AP pads with 200watts.  The patient converted to normal sinus rhythm. The patient tolerated the procedure well   IMPRESSION:  Successful cardioversion of Atypical Atrial Flutter    Colleen Williamson 11/16/2023, 9:32 AM   The patient tolerated the procedure well and was  transferred back to their room in stable condition.  Signed: Gaylyn Keas, MD [facility]

## 2023-11-16 NOTE — Anesthesia Postprocedure Evaluation (Signed)
 Anesthesia Post Note  Patient: Colleen Williamson  Procedure(s) Performed: TRANSESOPHAGEAL ECHOCARDIOGRAM CARDIOVERSION     Patient location during evaluation: PACU Anesthesia Type: MAC Level of consciousness: awake and alert Pain management: pain level controlled Vital Signs Assessment: post-procedure vital signs reviewed and stable Respiratory status: spontaneous breathing, nonlabored ventilation, respiratory function stable and patient connected to nasal cannula oxygen Cardiovascular status: stable and blood pressure returned to baseline Postop Assessment: no apparent nausea or vomiting Anesthetic complications: no   No notable events documented.  Last Vitals:  Vitals:   11/16/23 1132 11/16/23 1200  BP: 117/80 119/80  Pulse: 65 61  Resp: 15 (!) 26  Temp:    SpO2: 97% 96%    Last Pain:  Vitals:   11/16/23 0935  TempSrc:   PainSc: 0-No pain                 Lethaniel Rave

## 2023-11-16 NOTE — Interval H&P Note (Signed)
 History and Physical Interval Note:  11/16/2023 9:32 AM  Colleen Williamson  has presented today for surgery, with the diagnosis of afib.  The various methods of treatment have been discussed with the patient and family. After consideration of risks, benefits and other options for treatment, the patient has consented to  Procedure(s): TRANSESOPHAGEAL ECHOCARDIOGRAM (N/A) CARDIOVERSION (N/A) as a surgical intervention.  The patient's history has been reviewed, patient examined, no change in status, stable for surgery.  I have reviewed the patient's chart and labs.  Questions were answered to the patient's satisfaction.     Gaylyn Keas

## 2023-11-16 NOTE — Discharge Planning (Signed)
 Colleen Zwiebel J. Rachel Budds, RN, BSN, [place] [phone] RNCM consulted regarding benefits check for Tikosyn .  Benefits checked through [facility]; her copay will be $50.83.

## 2023-11-16 NOTE — Progress Notes (Signed)
*  PRELIMINARY RESULTS* Echocardiogram Echocardiogram Transesophageal has been performed.  Colleen Williamson 11/16/2023, 10:48 AM

## 2023-11-16 NOTE — Progress Notes (Signed)
 Pre-Tikosyn  EKG reviewed with Dr. Marven Slimmer, reduce starting dose to 250mcg BID.  Monitor per protocol.     Creighton Doffing, NP-C, AGACNP-BC [facility] - Electrophysiology  11/16/2023, 6:46 PM

## 2023-11-16 NOTE — Progress Notes (Addendum)
 Pharmacy: Dofetilide  (Tikosyn ) - Follow Up Assessment and Electrolyte Replacement  Pharmacy consulted to assist in monitoring and replacing electrolytes in this 78 y.o. female admitted on 11/15/2023 undergoing dofetilide  initiation on 11/16/23. First dofetilide  dose: 500 mcg 4/22 @2000  (after cardioversion)  Labs:    Component Value Date/Time   K 4.3 11/16/2023 0528   K 3.8 11/23/2013 1245   MG 2.2 11/16/2023 0528   MG 2.1 05/28/2012 1054     Plan: Potassium: K >/= 4: No additional supplementation needed  Magnesium : Mg > 2: No additional supplementation needed  *QTc off monitor 420. CrCl 61 ml/min. Noted to be on Diltiazem .   Note MD decided to dose reduce to 250 mcg BID.   Thank you for allowing pharmacy to participate in this patient's care   Lenard Quam, PharmD, BCPS, BCCCP Please refer to [facility] for [facility] Pharmacy numbers 11/16/2023 6:12 PM

## 2023-11-16 NOTE — Progress Notes (Signed)
 Post Tikosyn  EKG shows SR, HR 66, QTc 471

## 2023-11-16 NOTE — Anesthesia Preprocedure Evaluation (Signed)
 Anesthesia Evaluation  Patient identified by MRN, date of birth, ID band Patient awake    Reviewed: Allergy & Precautions, H&P , NPO status , Patient's Chart, lab work & pertinent test results  History of Anesthesia Complications Negative for: history of anesthetic complications  Airway Mallampati: I  TM Distance: >3 FB Neck ROM: Full    Dental  (+) Teeth Intact, Dental Advisory Given   Pulmonary neg pulmonary ROS, former smoker   breath sounds clear to auscultation       Cardiovascular hypertension, (-) CAD + dysrhythmias Atrial Fibrillation  Rhythm:Regular Rate:Normal  10/2022 ECHO:  NORMAL LEFT VENTRICULAR SYSTOLIC FUNCTION  NORMAL RIGHT VENTRICULAR SYSTOLIC FUNCTION  MILD VALVULAR REGURGITATION (See above)  NO VALVULAR STENOSIS    Hx of Mvr 2015   Neuro/Psych  PSYCHIATRIC DISORDERS Anxiety Depression    CVA    GI/Hepatic negative GI ROS, Neg liver ROS,,,  Endo/Other  negative endocrine ROS    Renal/GU negative Renal ROS  negative genitourinary   Musculoskeletal negative musculoskeletal ROS (+)    Abdominal   Peds negative pediatric ROS (+)  Hematology negative hematology ROS (+) Eliquis    Anesthesia Other Findings   Reproductive/Obstetrics negative OB ROS                              Anesthesia Physical Anesthesia Plan  ASA: 3  Anesthesia Plan: MAC   Post-op Pain Management:    Induction: Intravenous  PONV Risk Score and Plan: 0 and Propofol  infusion and Treatment may vary due to age or medical condition  Airway Management Planned: Natural Airway  Additional Equipment:   Intra-op Plan:   Post-operative Plan:   Informed Consent: I have reviewed the patients History and Physical, chart, labs and discussed the procedure including the risks, benefits and alternatives for the proposed anesthesia with the patient or authorized representative who has indicated his/her  understanding and acceptance.     Dental advisory given  Plan Discussed with: CRNA  Anesthesia Plan Comments:          Anesthesia Quick Evaluation

## 2023-11-16 NOTE — Progress Notes (Signed)
 Pharmacy: Dofetilide  (Tikosyn ) - Follow Up Assessment and Electrolyte Replacement  Pharmacy consulted to assist in monitoring and replacing electrolytes in this 78 y.o. female admitted on 11/15/2023 undergoing dofetilide  initiation. First dofetilide  dose: 500 mcg 4/22 @2000  (after cardioversion)  Labs:    Component Value Date/Time   K 4.3 11/16/2023 0528   K 3.8 11/23/2013 1245   MG 2.2 11/16/2023 0528   MG 2.1 05/28/2012 1054     Plan: Potassium: K >/= 4: No additional supplementation needed  Magnesium : Mg > 2: No additional supplementation needed  Thank you for allowing pharmacy to participate in this patient's care   Volney Grumbles, PharmD PGY-1 Acute Care Pharmacy Resident 11/16/2023 7:36 AM

## 2023-11-16 NOTE — Transfer of Care (Signed)
 Immediate Anesthesia Transfer of Care Note  Patient: Colleen Williamson  Procedure(s) Performed: TRANSESOPHAGEAL ECHOCARDIOGRAM CARDIOVERSION  Patient Location: PACU and Cath Lab  Anesthesia Type:MAC  Level of Consciousness: awake, alert , and oriented  Airway & Oxygen Therapy: Patient Spontanous Breathing and Patient connected to nasal cannula oxygen  Post-op Assessment: Report given to RN and Post -op Vital signs reviewed and stable  Post vital signs: stable  Last Vitals:  Vitals Value Taken Time  BP    Temp    Pulse 71 11/16/23 1056  Resp 14 11/16/23 1056  SpO2 97 % 11/16/23 1056  Vitals shown include unfiled device data.  Last Pain:  Vitals:   11/16/23 0935  TempSrc:   PainSc: 0-No pain         Complications: No notable events documented.

## 2023-11-17 ENCOUNTER — Other Ambulatory Visit: Payer: Self-pay

## 2023-11-17 DIAGNOSIS — I4892 Unspecified atrial flutter: Secondary | ICD-10-CM | POA: Diagnosis not present

## 2023-11-17 LAB — BASIC METABOLIC PANEL WITH GFR
Anion gap: 7 (ref 5–15)
BUN: 23 mg/dL (ref 8–23)
CO2: 25 mmol/L (ref 22–32)
Calcium: 8.9 mg/dL (ref 8.9–10.3)
Chloride: 107 mmol/L (ref 98–111)
Creatinine, Ser: 0.82 mg/dL (ref 0.44–1.00)
GFR, Estimated: >60 mL/min (ref >60)
Glucose, Bld: 84 mg/dL (ref 70–99)
Potassium: 4.3 mmol/L (ref 3.5–5.1)
Sodium: 139 mmol/L (ref 135–145)

## 2023-11-17 LAB — MAGNESIUM: Magnesium: 2 mg/dL (ref 1.7–2.4)

## 2023-11-17 MED ORDER — GUAIFENESIN ER 600 MG PO TB12
600.0000 mg | ORAL_TABLET | Freq: Two times a day (BID) | ORAL | Status: DC | PRN
  Administered 2023-11-17: 600 mg via ORAL
  Filled 2023-11-17: qty 1

## 2023-11-17 MED ORDER — MAGNESIUM SULFATE 2 GM/50ML IV SOLN
2.0000 g | Freq: Once | INTRAVENOUS | Status: AC
Start: 2023-11-17 — End: 2023-11-17
  Administered 2023-11-17: 2 g via INTRAVENOUS
  Filled 2023-11-17: qty 50

## 2023-11-17 MED ORDER — HYDROCORTISONE 1 % EX CREA
1.0000 | TOPICAL_CREAM | Freq: Two times a day (BID) | CUTANEOUS | Status: DC
  Administered 2023-11-17 – 2023-11-19 (×5): 1 via TOPICAL
  Filled 2023-11-17: qty 28

## 2023-11-17 NOTE — Progress Notes (Signed)
 Pharmacy: Dofetilide  (Tikosyn ) - Follow Up Assessment and Electrolyte Replacement  Pharmacy consulted to assist in monitoring and replacing electrolytes in this 78 y.o. female admitted on 11/15/2023 undergoing dofetilide  initiation. First dofetilide  dose: 4/22  Labs:    Component Value Date/Time   K 4.3 11/17/2023 0553   K 3.8 11/23/2013 1245   MG 2.0 11/17/2023 0553   MG 2.1 05/28/2012 1054     Plan: Potassium: K >/= 4: No additional supplementation needed  Magnesium : Mg 1.8-2: Give Mg 2 gm IV x1     Thank you for allowing pharmacy to participate in this patient's care   Baxter Limber, PharmD Clinical Pharmacist **Pharmacist phone directory can now be found on [email or web address] ([email or web address]).  Listed under [facility].

## 2023-11-17 NOTE — Plan of Care (Signed)

## 2023-11-17 NOTE — Progress Notes (Addendum)
 Post dose EKG reviewed QTc remains stable OK to continue Tikosyn   RN reports pt request for Mucinex  given a recent URI, residual cough  Mertha Abrahams, PA-C

## 2023-11-17 NOTE — Progress Notes (Signed)
 Rounding Note    Patient Name: Colleen Williamson Date of Encounter: 11/17/2023  [facility] Cardiologist: Belva Boyden, MD   Subjective   Doing well  Inpatient Medications    Scheduled Meds:  apixaban   5 mg Oral BID   dofetilide   250 mcg Oral BID   sodium chloride  flush  3 mL Intravenous Q12H   Continuous Infusions:  diltiazem  (CARDIZEM ) infusion Stopped (11/16/23 1140)   PRN Meds: sodium chloride  flush   Vital Signs    Vitals:   11/16/23 1430 11/16/23 1458 11/16/23 1506 11/16/23 2010  BP:  116/73  121/74  Pulse: (!) 59 66  66  Resp: 12 14  15   Temp:  97.6 F (36.4 C)  97.8 F (36.6 C)  TempSrc:  Oral  Oral  SpO2: 95%   97%  Weight:   64 kg   Height:   5\' 8"  (1.727 m)     Intake/Output Summary (Last 24 hours) at 11/17/2023 0719 Last data filed at 11/17/2023 0450 Gross per 24 hour  Intake 1295.94 ml  Output 0 ml  Net 1295.94 ml      11/16/2023    3:06 PM 11/15/2023   10:39 AM 11/13/2023    9:02 AM  Last 3 Weights  Weight (lbs) 141 lb 1.5 oz 141 lb 12.8 oz 140 lb  Weight (kg) 64 kg 64.32 kg 63.504 kg      Telemetry    SR/SB, 50's-60's, rare PVC, couplet - Personally Reviewed  ECG    SR 66bpm, QTc - Personally Reviewed w/Dr. Marven Slimmer  Physical Exam   GEN: No acute distress.   Neck: No JVD Cardiac: RRR, no murmurs, rubs, or gallops.  Respiratory: CTA b/l. GI: Soft, nontender, non-distended  MS: No edema; No deformity. Neuro:  Nonfocal  Psych: Normal affect   Labs    High Sensitivity Troponin:   Recent Labs  Lab 11/13/23 0926 11/13/23 1106 11/15/23 1246 11/15/23 1539  TROPONINIHS 12 11 40* 57*     Chemistry Recent Labs  Lab 11/13/23 0926 11/15/23 1246 11/16/23 0528 11/17/23 0553  NA 139 142 139 139  K 4.1 3.6 4.3 4.3  CL 106 115* 110 107  CO2 23 21* 24 25  GLUCOSE 111* 88 95 84  BUN 22 23 21 23   CREATININE 0.71 0.76 0.74 0.82  CALCIUM 9.3 7.5* 9.0 8.9  MG  --  1.5* 2.2 2.0  PROT 7.3 5.4*  --   --    ALBUMIN 4.1 3.0*  --   --   AST 37 36  --   --   ALT 23 23  --   --   ALKPHOS 89 69  --   --   BILITOT 2.3* 1.6*  --   --   GFRNONAA >60 >60 >60 >60  ANIONGAP 10 6 5 7     Lipids No results for input(s): "CHOL", "TRIG", "HDL", "LABVLDL", "LDLCALC", "CHOLHDL" in the last 168 hours.  Hematology Recent Labs  Lab 11/13/23 0926 11/15/23 1246  WBC 5.7 5.5  RBC 5.75* 4.60  HGB 16.4* 13.5  HCT 49.6* 41.5  MCV 86.3 90.2  MCH 28.5 29.3  MCHC 33.1 32.5  RDW 13.1 13.5  PLT 232 186   Thyroid  No results for input(s): "TSH", "FREET4" in the last 168 hours.  BNPNo results for input(s): "BNP", "PROBNP" in the last 168 hours.  DDimer No results for input(s): "DDIMER" in the last 168 hours.   Radiology      Cardiac Studies  11/16/23: TEE Results: Normal LV size with mild to moderate LV dysfunction EF 40-45% Normal RV size and function Normal RA Dilated LA and very large LA appendage.  There is spontaneous echo contrast in the body of the LA.  No thrombus in the LA or LAA Normal TV with trivial TR Normal PV with trivial PR S/P Mitral Valve repair.  The posterior mitral valve leaflet appears fixed.  There is mild MR Trileaflet AV with mild AV sclerosis with no stenosis.   Normal interatrial septum with no evidence of shunt by colorflow dopper  Normal thoracic and ascending aorta.   06/10/23: EPS/ablation CONCLUSIONS: 1. Successful PVI 2. Successful ablation/isolation of the posterior wall 3. Successful ablation of perimitral atypical flutter (MV-RSPV line of ablation) 4. Intracardiac echo reveals trivial pericardial effusion, dilated LA 5. No early apparent complications. 6. Colchicine  0.6mg  PO BID x 5 days 7. Protonix  40mg  PO daily x 45 days     05/27/23: cardiac CT IMPRESSION: 1. There is normal pulmonary vein drainage into the left atrium. (2 on the right and 2 on the left).   2. The left atrial appendage is a large chicken wing type with ostial size 38 x 31 mm and  length 51 mm, Area 90 mm2. There is no thrombus in the left atrial appendage.   3. The esophagus runs in the left atrial midline and is not in the proximity to any of the pulmonary veins.   4.  Coronary calcium score of 0.     05/12/2016: TTE Study Conclusions  - Left ventricle: The cavity size was normal. Wall thickness was    normal. Systolic function was normal. The estimated ejection    fraction was in the range of 55% to 60%. Indeterminant diastolic    function (atrial flutter). Wall motion was normal; there were no    regional wall motion abnormalities.  - Aortic valve: There was no stenosis.  - Mitral valve: Status post mitral valve repair. There was no    evidence for stenosis. There was no significant regurgitation.    Pressure half-time: 97 ms. Mean gradient (D): 4 mm Hg.  - Left atrium: The atrium was mildly dilated.  - Right ventricle: The cavity size was normal. Systolic function    was normal.  - Right atrium: The atrium was mildly dilated.  - Pulmonary arteries: No complete TR doppler jet so unable to    estimate PA systolic pressure.  - Inferior vena cava: The vessel was normal in size. The    respirophasic diameter changes were in the normal range (>= 50%),    consistent with normal central venous pressure.     Patient Profile     78 y.o. female w/PMHx of MV repair 2017 stroke HTN Atrial fibrillation and flutter (typical/atypical)  Arrhythmia/AAD hx CTI ablation 12/2014  AFib ablation 06/10/23 (During the ablation, the veins, posterior wall, peri-mitral flutters were ablated)   Remote flecainide  stopped with intolerance 2/2 poor taste inher mouth Amiodarone  started 2016 >>  stopped pre-ablation Oct 2024  Admitted with suspect AFlutter w/RVR, symptomatic hypotensive Rate controlled with dilt gtt  TEE/DCCV 11/16/23 Started on Tikosyn   Assessment & Plan    Persistent AFib AFlutter, typical/atypical CHA2DS2Vasc is 6, on Eliquis  Tikosyn  load is in  progress K+ 4.3 Mag 2.0 Creat 0.82 (dose remains appropriate) QTc stable Continue tikosyn   VHD Remote MV repair No MS, mild MR by her TEE  HTN Hypotensive w/RVR Off home coreg  for now follow  CM Not volume OL  Likely 2/2 RVR Revisit echo once maintaining SR Plan to get her back on her coreg      For questions or updates, please contact [facility] Please consult [email or web address] for contact info under        Signed, Debbie Fails, PA-C  11/17/2023, 7:19 AM

## 2023-11-17 NOTE — TOC CM/SW Note (Signed)
 Transition of Care [facility]) - Inpatient Brief Assessment   Patient Details  Name: Colleen Williamson MRN: [id number] Date of Birth: 1946/03/22  [facility] of [facility]) CM/SW Contact:    Cosimo Diones, RN Phone Number: 11/17/2023, 4:12 PM   Clinical Narrative: Patient presented for Tikosyn  Load. Case Manager spoke with the patient regarding co pay cost. Patient is not agreeable to cost of $50.83 and wants to use [facility]. Patient would like to have the initial Rx filled via [facility] and the Rx refills 90 day supply escribed to [facility] [employer]. No further needs identified at this time.   Transition of Care Asessment: Insurance and Status: Insurance coverage has been reviewed Patient has primary care physician: Yes Home environment has been reviewed: reviewed Prior level of function:: independent Prior/Current Home Services: No current home services Social Drivers of Health Review: SDOH reviewed no interventions necessary Readmission risk has been reviewed: Yes Transition of care needs: no transition of care needs at this time

## 2023-11-18 ENCOUNTER — Other Ambulatory Visit: Payer: Self-pay

## 2023-11-18 DIAGNOSIS — I4892 Unspecified atrial flutter: Secondary | ICD-10-CM | POA: Diagnosis not present

## 2023-11-18 LAB — MAGNESIUM: Magnesium: 2.2 mg/dL (ref 1.7–2.4)

## 2023-11-18 LAB — BASIC METABOLIC PANEL WITH GFR
Anion gap: 7 (ref 5–15)
BUN: 18 mg/dL (ref 8–23)
CO2: 25 mmol/L (ref 22–32)
Calcium: 8.8 mg/dL — ABNORMAL LOW (ref 8.9–10.3)
Chloride: 107 mmol/L (ref 98–111)
Creatinine, Ser: 0.63 mg/dL (ref 0.44–1.00)
GFR, Estimated: >60 mL/min (ref >60)
Glucose, Bld: 87 mg/dL (ref 70–99)
Potassium: 4.2 mmol/L (ref 3.5–5.1)
Sodium: 139 mmol/L (ref 135–145)

## 2023-11-18 NOTE — Progress Notes (Signed)
 Rounding Note    Patient Name: Colleen Williamson Date of Encounter: 11/18/2023  [facility] Cardiologist: Belva Boyden, MD   Subjective   Doing well, ambulating.  Inpatient Medications    Scheduled Meds:  apixaban   5 mg Oral BID   dofetilide   250 mcg Oral BID   hydrocortisone  cream  1 Application Topical BID   sodium chloride  flush  3 mL Intravenous Q12H   Continuous Infusions:  diltiazem  (CARDIZEM ) infusion Stopped (11/16/23 1140)   PRN Meds: guaiFENesin , sodium chloride  flush   Vital Signs    Vitals:   11/16/23 2010 11/17/23 1334 11/17/23 2019 11/18/23 0500  BP: 121/74 110/65 139/86 123/80  Pulse: 66 62  62  Resp: 15 16  16   Temp: 97.8 F (36.6 C) 97.8 F (36.6 C) 98.9 F (37.2 C) 98.7 F (37.1 C)  TempSrc: Oral Oral Oral Oral  SpO2: 97% 100% 100% 96%  Weight:      Height:        Intake/Output Summary (Last 24 hours) at 11/18/2023 0733 Last data filed at 11/18/2023 0500 Gross per 24 hour  Intake 240 ml  Output 0 ml  Net 240 ml      11/16/2023    3:06 PM 11/15/2023   10:39 AM 11/13/2023    9:02 AM  Last 3 Weights  Weight (lbs) 141 lb 1.5 oz 141 lb 12.8 oz 140 lb  Weight (kg) 64 kg 64.32 kg 63.504 kg      Telemetry    SR/SB, 50's-60's- Personally Reviewed  ECG    SB 51bpm, QTc - Personally Reviewed w/Dr. Marven Slimmer  Physical Exam   Exam remains unchanged from yesterday GEN: No acute distress.   Neck: No JVD Cardiac: RRR, no murmurs, rubs, or gallops.  Respiratory: CTA b/l. GI: Soft, nontender, non-distended  MS: No edema; No deformity. Neuro:  Nonfocal  Psych: Normal affect   Labs    High Sensitivity Troponin:   Recent Labs  Lab 11/13/23 0926 11/13/23 1106 11/15/23 1246 11/15/23 1539  TROPONINIHS 12 11 40* 57*     Chemistry Recent Labs  Lab 11/13/23 0926 11/13/23 0926 11/15/23 1246 11/16/23 0528 11/17/23 0553 11/18/23 0518  NA 139  --  142 139 139 139  K 4.1  --  3.6 4.3 4.3 4.2  CL 106  --  115* 110  107 107  CO2 23  --  21* 24 25 25   GLUCOSE 111*  --  88 95 84 87  BUN 22  --  23 21 23 18   CREATININE 0.71  --  0.76 0.74 0.82 0.63  CALCIUM 9.3  --  7.5* 9.0 8.9 8.8*  MG  --    < > 1.5* 2.2 2.0 2.2  PROT 7.3  --  5.4*  --   --   --   ALBUMIN 4.1  --  3.0*  --   --   --   AST 37  --  36  --   --   --   ALT 23  --  23  --   --   --   ALKPHOS 89  --  69  --   --   --   BILITOT 2.3*  --  1.6*  --   --   --   GFRNONAA >60  --  >60 >60 >60 >60  ANIONGAP 10  --  6 5 7 7    < > = values in this interval not displayed.    Lipids  No results for input(s): "CHOL", "TRIG", "HDL", "LABVLDL", "LDLCALC", "CHOLHDL" in the last 168 hours.  Hematology Recent Labs  Lab 11/13/23 0926 11/15/23 1246  WBC 5.7 5.5  RBC 5.75* 4.60  HGB 16.4* 13.5  HCT 49.6* 41.5  MCV 86.3 90.2  MCH 28.5 29.3  MCHC 33.1 32.5  RDW 13.1 13.5  PLT 232 186   Thyroid  No results for input(s): "TSH", "FREET4" in the last 168 hours.  BNPNo results for input(s): "BNP", "PROBNP" in the last 168 hours.  DDimer No results for input(s): "DDIMER" in the last 168 hours.   Radiology      Cardiac Studies   11/16/23: TEE Results: Normal LV size with mild to moderate LV dysfunction EF 40-45% Normal RV size and function Normal RA Dilated LA and very large LA appendage.  There is spontaneous echo contrast in the body of the LA.  No thrombus in the LA or LAA Normal TV with trivial TR Normal PV with trivial PR S/P Mitral Valve repair.  The posterior mitral valve leaflet appears fixed.  There is mild MR Trileaflet AV with mild AV sclerosis with no stenosis.   Normal interatrial septum with no evidence of shunt by colorflow dopper  Normal thoracic and ascending aorta.   06/10/23: EPS/ablation CONCLUSIONS: 1. Successful PVI 2. Successful ablation/isolation of the posterior wall 3. Successful ablation of perimitral atypical flutter (MV-RSPV line of ablation) 4. Intracardiac echo reveals trivial pericardial effusion, dilated  LA 5. No early apparent complications. 6. Colchicine  0.6mg  PO BID x 5 days 7. Protonix  40mg  PO daily x 45 days     05/27/23: cardiac CT IMPRESSION: 1. There is normal pulmonary vein drainage into the left atrium. (2 on the right and 2 on the left).   2. The left atrial appendage is a large chicken wing type with ostial size 38 x 31 mm and length 51 mm, Area 90 mm2. There is no thrombus in the left atrial appendage.   3. The esophagus runs in the left atrial midline and is not in the proximity to any of the pulmonary veins.   4.  Coronary calcium score of 0.     05/12/2016: TTE Study Conclusions  - Left ventricle: The cavity size was normal. Wall thickness was    normal. Systolic function was normal. The estimated ejection    fraction was in the range of 55% to 60%. Indeterminant diastolic    function (atrial flutter). Wall motion was normal; there were no    regional wall motion abnormalities.  - Aortic valve: There was no stenosis.  - Mitral valve: Status post mitral valve repair. There was no    evidence for stenosis. There was no significant regurgitation.    Pressure half-time: 97 ms. Mean gradient (D): 4 mm Hg.  - Left atrium: The atrium was mildly dilated.  - Right ventricle: The cavity size was normal. Systolic function    was normal.  - Right atrium: The atrium was mildly dilated.  - Pulmonary arteries: No complete TR doppler jet so unable to    estimate PA systolic pressure.  - Inferior vena cava: The vessel was normal in size. The    respirophasic diameter changes were in the normal range (>= 50%),    consistent with normal central venous pressure.     Patient Profile     78 y.o. female w/PMHx of MV repair 2017 stroke HTN Atrial fibrillation and flutter (typical/atypical)  Arrhythmia/AAD hx CTI ablation 12/2014  AFib ablation 06/10/23 (During the  ablation, the veins, posterior wall, peri-mitral flutters were ablated)   Remote flecainide  stopped with  intolerance 2/2 poor taste inher mouth Amiodarone  started 2016 >>  stopped pre-ablation Oct 2024  Admitted with suspect AFlutter w/RVR, symptomatic hypotensive Rate controlled with dilt gtt  TEE/DCCV 11/16/23 Started on Tikosyn   Assessment & Plan    Persistent AFib AFlutter, typical/atypical CHA2DS2Vasc is 6, on Eliquis  Tikosyn  load is in progress K+ 4.2 Mag 2.2 Creat 0.62 (dose remains appropriate) QTc stable Continue tikosyn   Anticipate discharge tomorrow  VHD Remote MV repair No MS, mild MR by her TEE  HTN Hypotensive w/RVR Off home coreg  for now follow  CM Not volume OL Likely 2/2 RVR Revisit echo once maintaining SR Plan to get her back on her coreg      For questions or updates, please contact [facility] Please consult [email or web address] for contact info under        Signed, Debbie Fails, PA-C  11/18/2023, 7:33 AM

## 2023-11-18 NOTE — Progress Notes (Signed)
 Pharmacy: Dofetilide  (Tikosyn ) - Follow Up Assessment and Electrolyte Replacement  Pharmacy consulted to assist in monitoring and replacing electrolytes in this 78 y.o. female admitted on 11/15/2023 undergoing dofetilide  initiation.   Labs:    Component Value Date/Time   K 4.2 11/18/2023 0518   K 3.8 11/23/2013 1245   MG 2.2 11/18/2023 0518   MG 2.1 05/28/2012 1054     Plan: Potassium: K >/= 4: No additional supplementation needed  Magnesium : Mg > 2: No additional supplementation needed   She has only requires a total of 60meq po potassium since 4/21. I recommend no potassium supplementation at discharge  Thank you for allowing pharmacy to participate in this patient's care   Baxter Limber, PharmD Clinical Pharmacist **Pharmacist phone directory can now be found on [email or web address] ([email or web address]).  Listed under [facility].

## 2023-11-18 NOTE — Progress Notes (Signed)
 Post dose EKG is reviewed SR62bpm Manuallly measured QT , QTc  Continue Tikosyn  Anticipate discharge tomorrow  Mertha Abrahams, PA-C

## 2023-11-19 ENCOUNTER — Other Ambulatory Visit (HOSPITAL_COMMUNITY): Payer: Self-pay

## 2023-11-19 DIAGNOSIS — I4892 Unspecified atrial flutter: Secondary | ICD-10-CM | POA: Diagnosis not present

## 2023-11-19 LAB — BASIC METABOLIC PANEL WITH GFR
Anion gap: 10 (ref 5–15)
BUN: 18 mg/dL (ref 8–23)
CO2: 25 mmol/L (ref 22–32)
Calcium: 9 mg/dL (ref 8.9–10.3)
Chloride: 102 mmol/L (ref 98–111)
Creatinine, Ser: 0.72 mg/dL (ref 0.44–1.00)
GFR, Estimated: >60 mL/min (ref >60)
Glucose, Bld: 101 mg/dL — ABNORMAL HIGH (ref 70–99)
Potassium: 3.8 mmol/L (ref 3.5–5.1)
Sodium: 137 mmol/L (ref 135–145)

## 2023-11-19 LAB — MAGNESIUM: Magnesium: 1.8 mg/dL (ref 1.7–2.4)

## 2023-11-19 MED ORDER — MAGNESIUM SULFATE 2 GM/50ML IV SOLN
2.0000 g | Freq: Once | INTRAVENOUS | Status: AC
  Administered 2023-11-19: 2 g via INTRAVENOUS
  Filled 2023-11-19: qty 50

## 2023-11-19 MED ORDER — POTASSIUM CHLORIDE CRYS ER 20 MEQ PO TBCR
20.0000 meq | EXTENDED_RELEASE_TABLET | Freq: Every day | ORAL | 5 refills | Status: DC
  Filled 2023-11-19: qty 30, 30d supply, fill #0

## 2023-11-19 MED ORDER — DOFETILIDE 250 MCG PO CAPS
250.0000 ug | ORAL_CAPSULE | Freq: Two times a day (BID) | ORAL | 5 refills | Status: DC
Start: 2023-11-19 — End: 2024-03-08
  Filled 2023-11-19: qty 60, 30d supply, fill #0

## 2023-11-19 MED ORDER — POTASSIUM CHLORIDE CRYS ER 20 MEQ PO TBCR
40.0000 meq | EXTENDED_RELEASE_TABLET | Freq: Once | ORAL | Status: AC
  Administered 2023-11-19: 40 meq via ORAL
  Filled 2023-11-19: qty 2

## 2023-11-19 NOTE — Progress Notes (Signed)
 Post dose EKG reviewed Qtc stable Telemetry maintaining SR Labs pending   Anticipate discharge later today  Mertha Abrahams, PA-C

## 2023-11-19 NOTE — Plan of Care (Signed)
   Problem: Activity: Goal: Ability to tolerate increased activity will improve Outcome: Progressing

## 2023-11-19 NOTE — Progress Notes (Signed)
 Mobility Specialist Progress Note;   11/19/23 0900  Mobility  Activity Ambulated independently in hallway  Level of Assistance Modified independent, requires aide device or extra time  Assistive Device None  Distance Ambulated (ft) 400 ft  Activity Response Tolerated well  Mobility Referral Yes  Mobility visit 1 Mobility  Mobility Specialist Start Time (ACUTE ONLY) 0900  Mobility Specialist Stop Time (ACUTE ONLY) 0905  Mobility Specialist Time Calculation (min) (ACUTE ONLY) 5 min   Pt eager for mobility. Required no physical assistance during ambulation, SV. VSS throughout and no c/ when asked. Pt returned back to bed with all needs met.   Colleen Williamson Mobility Specialist Please contact via SecureChat or [employer] [phone]

## 2023-11-19 NOTE — Progress Notes (Signed)
 DISCHARGE NOTE HOME WILLAMAE DEMBY to be discharged Home per MD order. Discussed prescriptions and follow up appointments with the patient. Prescriptions given to patient; medication list explained in detail. Patient verbalized understanding.  Skin clean, dry and intact without evidence of skin break down, no evidence of skin tears noted. IV catheter discontinued intact. Site without signs and symptoms of complications. Dressing and pressure applied. Pt denies pain at the site currently. No complaints noted.  Patient free of lines, drains, and wounds.   An After Visit Summary (AVS) was printed and given to the patient. Patient escorted via wheelchair, and discharged home via private auto.  Meliss Fleek K Felicia Both, RN

## 2023-11-19 NOTE — Care Management Important Message (Signed)
 Important Message  Patient Details  Name: Colleen Williamson MRN: [id number] Date of Birth: [birth year]   Important Message Given:  Yes - Medicare IM     Janith Melnick 11/19/2023, 11:39 AM

## 2023-11-19 NOTE — Progress Notes (Signed)
 Pharmacy: Dofetilide  (Tikosyn ) - Follow Up Assessment and Electrolyte Replacement  Pharmacy consulted to assist in monitoring and replacing electrolytes in this 78 y.o. female admitted on 11/15/2023 undergoing dofetilide  initiation. First dofetilide  dose: 11/16/23  Labs:    Component Value Date/Time   K 3.8 11/19/2023 0755   K 3.8 11/23/2013 1245   MG 1.8 11/19/2023 0755   MG 2.1 05/28/2012 1054     Plan: Potassium: K 3.8-3.9:  Give KCl 40 mEq po x1   Magnesium : Mg 1.8-2: Give Mg 2 gm IV x1    As patient has required on average 20 mEq of potassium replacement every day, recommend discharging patient with prescription for:  Potassium chloride  20 mEq  daily  Thank you for allowing pharmacy to participate in this patient's care   Sheron Dixons 11/19/2023  9:48 AM

## 2023-11-19 NOTE — Discharge Summary (Signed)
 DISCHARGE SUMMARY    Patient ID: Colleen Williamson,  MRN: [id number], DOB/AGE: 76-Feb-1947 76 y.o.  Admit date: 11/15/2023 Discharge date: 11/19/2023  Primary Care Physician: Yehuda Helms, MD  Primary Cardiologist: Dr. Jerelene Monday Electrophysiologist: Dr. Marven Slimmer  Primary Discharge Diagnosis:  1.  persistent atrial fibrillation 2.  Atypical atrial flutter      status post Tikosyn  loading this admission      CHA2DS2Vasc is 6, on Eliquis   Secondary Discharge Diagnosis:  MVR (2017) Stroke history HTN  Allergies  Allergen Reactions   Flecainide      Funny taste; hairloss   Sulfa Antibiotics Nausea Only     Procedures This Admission:  1.  Tikosyn  loading 2.  TEE/Direct current cardioversion on 11/16/23 by Dr Micael Adas which successfully restored SR.  There were no early apparent complications.   Brief [name] is a 79 y.o. female with a past medical history as noted above.  She was referred to the ER via EMS from the office in a symptomatic SVT > felt to be rapidly conducting Aflutter.    Hospital Course:  The patient was admitted rate controlled.  With some missed OAC doses, underwent TEE  > DCCV.  TEE noted LVEF mildly reduced 4-45%. Tikosyn  was subsequently initiated.  Renal function and electrolytes were followed during the hospitalization.  The patient's QTc remained stable.  She was monitored until discharge on telemetry which demonstrated SB/SR 50's-60's.  On the day of discharge, she feels well, was examined by Dr Marven Slimmer who considered the patient stable for discharge to home.  Follow-up has been arranged with the AFib clinic in 1 week and with EP team in 4 weeks.   Tikosyn  teaching was completed electrolyte replacement for home KDur 20meq daily  Physical Exam: Vitals:   11/18/23 1418 11/18/23 2029 11/19/23 0325 11/19/23 0836  BP: 134/82 133/84 129/77 131/84  Pulse: 71 77 63 68  Resp: 18 18 18 17   Temp: (!) 97.4 F (36.3 C) 97.8 F (36.6 C) 97.7  F (36.5 C) (!) 97.3 F (36.3 C)  TempSrc: Oral Oral Oral Oral  SpO2: (!) 80% 92% 96%   Weight:      Height:         GEN- The patient is well appearing, alert and oriented x 3 today.   HEENT: normocephalic, atraumatic; sclera clear, conjunctiva pink; hearing intact; oropharynx clear; neck supple, no JVP Lymph- no cervical lymphadenopathy Lungs- CTA b/l, normal work of breathing.  No wheezes, rales, rhonchi Heart- RRR, no murmurs, rubs or gallops, PMI not laterally displaced GI- soft, non-tender, non-distended Extremities- no clubbing, cyanosis, or edema MS- no significant deformity or atrophy Skin- warm and dry, no rash or lesion Psych- euthymic mood, full affect Neuro- strength and sensation are intact   Labs:   Lab Results  Component Value Date   WBC 5.5 11/15/2023   HGB 13.5 11/15/2023   HCT 41.5 11/15/2023   MCV 90.2 11/15/2023   PLT 186 11/15/2023    Recent Labs  Lab 11/15/23 1246 11/16/23 0528 11/19/23 0755  NA 142   < > 137  K 3.6   < > 3.8  CL 115*   < > 102  CO2 21*   < > 25  BUN 23   < > 18  CREATININE 0.76   < > 0.72  CALCIUM 7.5*   < > 9.0  PROT 5.4*  --   --   BILITOT 1.6*  --   --  ALKPHOS 69  --   --   ALT 23  --   --   AST 36  --   --   GLUCOSE 88   < > 101*   < > = values in this interval not displayed.     Discharge Medications:  Allergies as of 11/19/2023       Reactions   Flecainide     Funny taste; hairloss   Sulfa Antibiotics Nausea Only        Medication List     STOP taking these medications    carvedilol  3.125 MG tablet Commonly known as: COREG        TAKE these medications    amoxicillin 500 MG capsule Commonly known as: AMOXIL Take 2,000 mg by mouth See admin instructions. Take 4 capsules (2000 mg) by mouth 1 hour prior to dental appointments.   apixaban  5 MG Tabs tablet Commonly known as: Eliquis  Take 1 tablet (5 mg total) by mouth 2 (two) times daily.   dofetilide  250 MCG capsule Commonly known as:  TIKOSYN  Take 1 capsule (250 mcg total) by mouth 2 (two) times daily.   ipratropium 0.03 % nasal spray Commonly known as: ATROVENT Place 1 spray into both nostrils daily as needed (allergies/congestion.).   potassium chloride  SA 20 MEQ tablet Commonly known as: KLOR-CON  M Take 1 tablet (20 mEq total) by mouth daily.   rosuvastatin 10 MG tablet Commonly known as: CRESTOR Take 10 mg by mouth in the morning.        Disposition: Home Discharge Instructions     Diet - low sodium heart healthy   Complete by: As directed    Increase activity slowly   Complete by: As directed         Duration of Discharge Encounter: 15 minutes, APP time.  [place], PA-C 11/19/2023 10:59 AM

## 2023-11-26 ENCOUNTER — Ambulatory Visit (HOSPITAL_COMMUNITY)
Admit: 2023-11-26 | Discharge: 2023-11-26 | Disposition: A | Discharge status: Home / Self Care | Attending: Internal Medicine | Admitting: Internal Medicine

## 2023-11-26 VITALS — BP 148/92 | HR 65 | Ht 68.0 in | Wt 141.6 lb

## 2023-11-26 DIAGNOSIS — I4891 Unspecified atrial fibrillation: Secondary | ICD-10-CM | POA: Diagnosis not present

## 2023-11-26 DIAGNOSIS — Z5181 Encounter for therapeutic drug level monitoring: Secondary | ICD-10-CM | POA: Diagnosis not present

## 2023-11-26 DIAGNOSIS — D6869 Other thrombophilia: Secondary | ICD-10-CM | POA: Diagnosis not present

## 2023-11-26 DIAGNOSIS — Z79899 Other long term (current) drug therapy: Secondary | ICD-10-CM

## 2023-11-26 DIAGNOSIS — I484 Atypical atrial flutter: Secondary | ICD-10-CM

## 2023-11-26 NOTE — Progress Notes (Signed)
 Primary Care Physician: Yehuda Helms, MD Primary Cardiologist: Belva Boyden, MD Electrophysiologist: Boyce Byes, MD     Referring Physician: Dr. Alphia Jasmine IVONE PAPAGEORGIOU is a 78 y.o. female with a history of MVR in 2016 complicated by stroke, HTN, SVT, atrial fibrillation/flutter who presents for consultation in the [facility]. S/p hospital discharge 4/21-25/25 - patient referred to ER via EMS from office visit on 4/21 in a symptomatic SVT felt to be rapidly conducted atrial flutter. She was loaded on Tikosyn  250 mcg BID. Patient is on Eliquis  5 mg BID for a CHADS2VASC score of 6.  On evaluation today, she is currently in NSR. She has had no episodes of Afib since hospital discharge. She is doing well but is anxious about Tikosyn  medication. No missed doses of Tikosyn  or Eliquis .   Today, she denies symptoms of palpitations, chest pain, shortness of breath, orthopnea, PND, lower extremity edema, dizziness, presyncope, syncope, snoring, daytime somnolence, bleeding, or neurologic sequela. The patient is tolerating medications without difficulties and is otherwise without complaint today.   she has a BMI of Body mass index is 21.53 kg/m.Aaron Aas Filed Weights   11/26/23 1024  Weight: 64.2 kg    Current Outpatient Medications  Medication Sig Dispense Refill   amoxicillin (AMOXIL) 500 MG capsule Take 2,000 mg by mouth See admin instructions. Take 4 capsules (2000 mg) by mouth 1 hour prior to dental appointments.     apixaban  (ELIQUIS ) 5 MG TABS tablet Take 1 tablet (5 mg total) by mouth 2 (two) times daily. 180 tablet 1   benzonatate (TESSALON) 200 MG capsule Take 200 mg by mouth 3 (three) times daily as needed.     dofetilide  (TIKOSYN ) 250 MCG capsule Take 1 capsule (250 mcg total) by mouth 2 (two) times daily. 60 capsule 5   ipratropium (ATROVENT) 0.03 % nasal spray Place 1 spray into both nostrils daily as needed (allergies/congestion.).      potassium chloride  SA (KLOR-CON  M) 20 MEQ tablet Take 1 tablet (20 mEq total) by mouth daily. 30 tablet 5   rosuvastatin (CRESTOR) 10 MG tablet Take 10 mg by mouth in the morning.     No current facility-administered medications for this encounter.    Atrial Fibrillation Management history:  Previous antiarrhythmic drugs: Tikosyn  Previous cardioversions: 11/16/23 Previous ablations: 2024 Anticoagulation history: Eliquis    ROS- All systems are reviewed and negative except as per the HPI above.  Physical Exam: BP (!) 148/92   Pulse 65   Ht 5\' 8"  (1.727 m)   Wt 64.2 kg   BMI 21.53 kg/m   GEN: Well nourished, well developed in no acute distress NECK: No JVD; No carotid bruits CARDIAC: Regular rate and rhythm, no murmurs, rubs, gallops RESPIRATORY:  Clear to auscultation without rales, wheezing or rhonchi  ABDOMEN: Soft, non-tender, non-distended EXTREMITIES:  No edema; No deformity   EKG today demonstrates  Vent. rate 65 BPM PR interval 188 ms QRS duration 84 ms QT/QTcB 436/453 ms P-R-T axes 76 245 91 Normal sinus rhythm Right superior axis deviation Pulmonary disease pattern Right ventricular hypertrophy  Echo 11/16/23 demonstrated  1. Left ventricular ejection fraction, by estimation, is 40 to 45%. The  left ventricle has mildly decreased function. The left ventricle  demonstrates global hypokinesis.   2. Right ventricular systolic function is normal. The right ventricular  size is normal.   3. Left atrial size was moderately dilated. No left atrial/left atrial  appendage thrombus was  detected.   4. The mitral valve has been repaired/replaced. Mild mitral valve  regurgitation. No evidence of mitral stenosis. The posterior mitral valve  leaflet was not well visualized and may be fixed   5. The aortic valve is tricuspid. Aortic valve regurgitation is not  visualized. Aortic valve sclerosis/calcification is present, without any  evidence of aortic stenosis.   6. The  inferior vena cava is normal in size with greater than 50%  respiratory variability, suggesting right atrial pressure of 3 mmHg.    ASSESSMENT & PLAN CHA2DS2-VASc Score = 6  The patient's score is based upon: CHF History: 0 HTN History: 1 Diabetes History: 0 Stroke History: 2 Vascular Disease History: 0 Age Score: 2 Gender Score: 1       ASSESSMENT AND PLAN: Paroxysmal Atrial Fibrillation/ flutter (ICD10:  I48.0) The patient's CHA2DS2-VASc score is 6, indicating a 9.7% annual risk of stroke.   S/p Tikosyn  4/21-25/25.  She is currently in NSR. Education on Tikosyn  and reassurance provided. She will continue her Tikosyn  surveillance visits in the [place] location due to preference.   High risk medication monitoring (ICD10: J342684) Patient requires ongoing monitoring for anti-arrhythmic medication which has the potential to cause life threatening arrhythmias or AV block. Qtc stable. Continue Tikosyn  250 mcg BID. Bmet and mag drawn today.   Secondary Hypercoagulable State (ICD10:  D68.69) The patient is at significant risk for stroke/thromboembolism based upon her CHA2DS2-VASc Score of 6.  Continue Apixaban  (Eliquis ).  Continue Eliquis  without interruption.       Follow up 1 month as scheduled for Tikosyn  surveillance.    Minnie Amber, PA-C  Afib Clinic [facility] [address] [place], [place] [address] [phone]

## 2023-11-27 LAB — BASIC METABOLIC PANEL WITH GFR
BUN/Creatinine Ratio: 21 (ref 12–28)
BUN: 14 mg/dL (ref 8–27)
CO2: 23 mmol/L (ref 20–29)
Calcium: 9.8 mg/dL (ref 8.7–10.3)
Chloride: 105 mmol/L (ref 96–106)
Creatinine, Ser: 0.68 mg/dL (ref 0.57–1.00)
Glucose: 92 mg/dL (ref 70–99)
Potassium: 5.2 mmol/L (ref 3.5–5.2)
Sodium: 147 mmol/L — ABNORMAL HIGH (ref 134–144)
eGFR: 91 mL/min/{1.73_m2} (ref >59)

## 2023-11-27 LAB — MAGNESIUM: Magnesium: 2.1 mg/dL (ref 1.6–2.3)

## 2023-11-30 ENCOUNTER — Other Ambulatory Visit (HOSPITAL_COMMUNITY): Payer: Self-pay

## 2023-11-30 ENCOUNTER — Telehealth: Payer: Self-pay | Admitting: Cardiology

## 2023-11-30 MED ORDER — POTASSIUM CHLORIDE CRYS ER 10 MEQ PO TBCR
10.0000 meq | EXTENDED_RELEASE_TABLET | Freq: Every day | ORAL | 3 refills | Status: DC
Start: 2023-11-30 — End: 2024-06-09

## 2023-11-30 NOTE — Telephone Encounter (Signed)
 Spoke with patient and reviewed that I would be happy to send her request to Dr. Marven Slimmer. She also inquired about her potassium level and being on potassium daily. She wanted to know if she should continue that. Advised that I will send this over to Dr. Marven Slimmer and we would be in touch with his recommendations.

## 2023-11-30 NOTE — Telephone Encounter (Signed)
 Pt called in stating she has and asked if she can split them in half.

## 2023-11-30 NOTE — Telephone Encounter (Signed)
 Pt called to afib clinic regarding lab results. Per Caesar Caster PA will decrease potassium to 10meq daily with potassium of 5.2. Pt in agreement. Pt also concerned regarding sodium level - encouraged follow up with PCP regarding this level.

## 2023-11-30 NOTE — Telephone Encounter (Signed)
 Patient notified that she can cut that in half. She verbalized understanding

## 2023-11-30 NOTE — Telephone Encounter (Signed)
 Pt requesting that Dr. Marven Slimmer take a look at her lab results from 5/2. Pt would like a c/b regarding her Potassium level. Please advise

## 2023-12-02 ENCOUNTER — Other Ambulatory Visit: Payer: Self-pay | Admitting: Physician Assistant

## 2023-12-15 DIAGNOSIS — H6121 Impacted cerumen, right ear: Secondary | ICD-10-CM | POA: Diagnosis not present

## 2023-12-15 DIAGNOSIS — F419 Anxiety disorder, unspecified: Secondary | ICD-10-CM | POA: Diagnosis not present

## 2023-12-15 DIAGNOSIS — R8290 Unspecified abnormal findings in urine: Secondary | ICD-10-CM | POA: Diagnosis not present

## 2023-12-15 DIAGNOSIS — Z79899 Other long term (current) drug therapy: Secondary | ICD-10-CM | POA: Diagnosis not present

## 2023-12-15 DIAGNOSIS — K219 Gastro-esophageal reflux disease without esophagitis: Secondary | ICD-10-CM | POA: Diagnosis not present

## 2023-12-15 DIAGNOSIS — E7800 Pure hypercholesterolemia, unspecified: Secondary | ICD-10-CM | POA: Diagnosis not present

## 2023-12-15 DIAGNOSIS — I10 Essential (primary) hypertension: Secondary | ICD-10-CM | POA: Diagnosis not present

## 2023-12-15 DIAGNOSIS — R432 Parageusia: Secondary | ICD-10-CM | POA: Diagnosis not present

## 2023-12-15 DIAGNOSIS — I480 Paroxysmal atrial fibrillation: Secondary | ICD-10-CM | POA: Diagnosis not present

## 2023-12-21 NOTE — Progress Notes (Unsigned)
 Electrophysiology Clinic Note    Date:  12/22/2023  Patient ID:  Colleen, Williamson 1946-05-20, MRN [id number] PCP:  Yehuda Helms, MD  Cardiologist:  Belva Boyden, MD Electrophysiologist: Boyce Byes, MD    Discussed the use of AI scribe software for clinical note transcription with the patient, who gave verbal consent to proceed.   Patient Profile    Chief Complaint: tikosyn  follow-up  History of Present Illness: Colleen Williamson is a 78 y.o. female with PMH notable for MVR (2016) c/b CVA, afib, aflutter, HTN, SVT; seen today for Boyce Byes, MD for routine electrophysiology followup.   She is status post A-fib ablation with isolation of pulmonary veins, posterior wall, perimitral atypical flutter ablation 05/2023 by Dr. Marven Slimmer.   She presented to [facility] ER 11/13/23 in SVT at 160 bpm.  Adenosine  given that revealed likely atrial flutter.  She converted with IV Cardizem .  She unfortunately had recurrence of SVT and was again seen at [facility], ER 11/15/2023.  She had TEE/DCCV with conversion to SR. She was then loaded on tikosyn , discharged on 250mcg BID.   She saw PA Virgil Griffiths earlier this month where she was maintaining sinus rhythm.   On follow-up today, she is feeling very well, no AF episodes that she is aware of. She takes tikosyn  8a/8p, uses alarms to remember. She reduced her potassium supplement to 10meq daily after recent lab work showed K of over 5, she was concerned by this high level.  She continues to take eliquis  BID, no bleeding concerns.      Arrhythmia/Device History Tikosyn  - loaded 10/2023      ROS:  Please see the history of present illness. All other systems are reviewed and otherwise negative.    Physical Exam    VS:  BP 124/84 (BP Location: Left Arm, Patient Position: Sitting, Cuff Size: Normal)   Pulse 82   Ht 5\' 8"  (1.727 m)   Wt 143 lb (64.9 kg)   SpO2 97%   BMI 21.74 kg/m  BMI: Body mass index is 21.74 kg/m.  Wt  Readings from Last 3 Encounters:  12/22/23 143 lb (64.9 kg)  11/26/23 141 lb 9.6 oz (64.2 kg)  11/16/23 141 lb 1.5 oz (64 kg)     GEN- The patient is well appearing, alert and oriented x 3 today.   Lungs- Clear to ausculation bilaterally, normal work of breathing.  Heart- Regular rate and rhythm, no murmurs, rubs or gallops Extremities- No peripheral edema, warm, dry    Studies Reviewed   Previous EP, cardiology notes.    EKG is ordered. Personal review of EKG from today shows:    EKG Interpretation Date/Time:  Wednesday Dec 22 2023 10:42:13 EDT Ventricular Rate:  82 PR Interval:  192 QRS Duration:  82 QT Interval:  396 QTC Calculation: 462 R Axis:   107  Text Interpretation: Normal sinus rhythm Rightward axis Confirmed by Dalyah Pla [phone]) on 12/22/2023 1:11:22 PM    11/26/2023 EKG - SR at 65; QRS 84, QT 426 / QTC 453  TEE, 11/16/2023  1. Left ventricular ejection fraction, by estimation, is 40 to 45%. The left ventricle has mildly decreased function. The left ventricle demonstrates global hypokinesis.   2. Right ventricular systolic function is normal. The right ventricular size is normal.   3. Left atrial size was moderately dilated. No left atrial/left atrial appendage thrombus was detected.   4. The mitral valve has been repaired/replaced. Mild mitral valve regurgitation.  No evidence of mitral stenosis. The posterior mitral valve leaflet was not well visualized and may be fixed   5. The aortic valve is tricuspid. Aortic valve regurgitation is not visualized. Aortic valve sclerosis/calcification is present, without any evidence of aortic stenosis.   6. The inferior vena cava is normal in size with greater than 50% respiratory variability, suggesting right atrial pressure of 3 mmHg.   Conclusion(s)/Recommendation(s): Normal biventricular function without evidence of hemodynamically significant valvular heart disease. No LA/LAA thrombus identified. Successful cardioversion  performed with restoration of normal sinus rhythm.   TTE, 10/27/2022 (via [facility]) NORMAL LEFT VENTRICULAR SYSTOLIC FUNCTION  NORMAL RIGHT VENTRICULAR SYSTOLIC FUNCTION  MILD VALVULAR REGURGITATION (See above)  NO VALVULAR STENOSIS    Assessment and Plan     #) parox Afib #) typical aflutter #) tikosyn  monitoring S/p AF, aflutter ablation 05/2023 Status post Tikosyn  loading 10/2023 EKG with stable Qtc, today, 453 previously Continue Tikosyn  250 mcg twice daily Recent labwork via [facility] with stable Cr, electrolytes Continue 10meq potassium daily  #) Hypercoag d/t persis afib CHA2DS2-VASc Score = at least 6 [CHF History: 0, HTN History: 1, Diabetes History: 0, Stroke History: 2, Vascular Disease History: 0, Age Score: 2, Gender Score: 1].  Therefore, the patient's annual risk of stroke is 9.7 %.    Stroke ppx - 5mg  eliquis  BID, appropriately dosed No bleeding concerns          Current medicines are reviewed at length with the patient today.   The patient does not have concerns regarding her medicines.  The following changes were made today:  none  Labs/ tests ordered today include:  Orders Placed This Encounter  Procedures   EKG 12-Lead     Disposition: Follow up with Dr. Marven Slimmer or EP APP in 3 months   Signed, Raymund Manrique, NP  12/22/23  1:11 PM  Electrophysiology [facility]

## 2023-12-22 ENCOUNTER — Ambulatory Visit: Attending: Cardiology | Admitting: Cardiology

## 2023-12-22 ENCOUNTER — Encounter: Payer: Self-pay | Admitting: Cardiology

## 2023-12-22 VITALS — BP 124/84 | HR 82 | Ht 68.0 in | Wt 143.0 lb

## 2023-12-22 DIAGNOSIS — Z5181 Encounter for therapeutic drug level monitoring: Secondary | ICD-10-CM

## 2023-12-22 DIAGNOSIS — D6869 Other thrombophilia: Secondary | ICD-10-CM

## 2023-12-22 DIAGNOSIS — I480 Paroxysmal atrial fibrillation: Secondary | ICD-10-CM

## 2023-12-22 DIAGNOSIS — I483 Typical atrial flutter: Secondary | ICD-10-CM

## 2023-12-22 DIAGNOSIS — Z79899 Other long term (current) drug therapy: Secondary | ICD-10-CM | POA: Diagnosis not present

## 2023-12-22 NOTE — Patient Instructions (Signed)
 Medication Instructions:  Your physician recommends that you continue on your current medications as directed. Please refer to the Current Medication list given to you today.  *If you need a refill on your cardiac medications before your next appointment, please call your pharmacy*  Lab Work: No labs ordered today  If you have labs (blood work) drawn today and your tests are completely normal, you will receive your results only by: MyChart Message (if you have MyChart) OR A paper copy in the mail If you have any lab test that is abnormal or we need to change your treatment, we will call you to review the results.  Testing/Procedures: No test ordered today   Follow-Up: At [facility], you and your health needs are our priority.  As part of our continuing mission to provide you with exceptional heart care, our providers are all part of one team.  This team includes your primary Cardiologist (physician) and Advanced Practice Providers or APPs (Physician Assistants and Nurse Practitioners) who all work together to provide you with the care you need, when you need it.  Your next appointment:   3 month(s)  Provider:   Harvie Liner, MD or Suzann Riddle, NP

## 2023-12-23 DIAGNOSIS — E538 Deficiency of other specified B group vitamins: Secondary | ICD-10-CM | POA: Diagnosis not present

## 2023-12-23 DIAGNOSIS — E559 Vitamin D deficiency, unspecified: Secondary | ICD-10-CM | POA: Diagnosis not present

## 2023-12-24 ENCOUNTER — Ambulatory Visit: Admitting: Physician Assistant

## 2023-12-27 ENCOUNTER — Ambulatory Visit: Payer: PPO | Admitting: Dermatology

## 2023-12-27 DIAGNOSIS — L649 Androgenic alopecia, unspecified: Secondary | ICD-10-CM | POA: Diagnosis not present

## 2023-12-27 DIAGNOSIS — L821 Other seborrheic keratosis: Secondary | ICD-10-CM

## 2023-12-27 DIAGNOSIS — L820 Inflamed seborrheic keratosis: Secondary | ICD-10-CM | POA: Diagnosis not present

## 2023-12-27 DIAGNOSIS — W908XXA Exposure to other nonionizing radiation, initial encounter: Secondary | ICD-10-CM | POA: Diagnosis not present

## 2023-12-27 DIAGNOSIS — L578 Other skin changes due to chronic exposure to nonionizing radiation: Secondary | ICD-10-CM | POA: Diagnosis not present

## 2023-12-27 DIAGNOSIS — L237 Allergic contact dermatitis due to plants, except food: Secondary | ICD-10-CM

## 2023-12-27 DIAGNOSIS — L719 Rosacea, unspecified: Secondary | ICD-10-CM | POA: Diagnosis not present

## 2023-12-27 DIAGNOSIS — L255 Unspecified contact dermatitis due to plants, except food: Secondary | ICD-10-CM

## 2023-12-27 DIAGNOSIS — Z7189 Other specified counseling: Secondary | ICD-10-CM | POA: Diagnosis not present

## 2023-12-27 MED ORDER — CLOBETASOL PROPIONATE 0.05 % EX CREA: TOPICAL_CREAM | CUTANEOUS | 1 refills | Status: DC

## 2023-12-27 MED ORDER — FINACEA 15 % EX FOAM: CUTANEOUS | 3 refills | Status: DC

## 2023-12-27 NOTE — Progress Notes (Signed)
 New Patient Visit   Subjective  Colleen Williamson is a 78 y.o. female who presents for the following: patient report "black scaly" patches around bra line and waist irritated. Patient also reports she got into poison oak over weekend and breaking out at chin, both forearms. Place at posterior R ear x1 week, sore to the touch. Patient would also like to discuss hair loss. Patient also has possible tag at L under eye she would like looked at. Patient would also like to discuss rosacea on face, has not been preciously prescribed anything.   The patient has spots, moles and lesions to be evaluated, some may be new or changing and the patient may have concern these could be cancer.   The following portions of the chart were reviewed this encounter and updated as appropriate: medications, allergies, medical history  Review of Systems:  No other skin or systemic complaints except as noted in HPI or Assessment and Plan.  Objective  Well appearing patient in no apparent distress; mood and affect are within normal limits.  A focused examination was performed of the following areas: Scalp, face, arms, R ear, flank  Relevant exam findings are noted in the Assessment and Plan.  Chin, forearms Pink edematous papules/vesicles BL wrists, chin R lower axilla x2, R inframammary x4, R ear x 1, L inframammary x4, L flank x2, L lower abdomen x1, R inguinal crease x1 (15) Stuck on waxy paps with erythema Left lower eyelid waxy stuck-on papule  Assessment & Plan   ACTINIC DAMAGE - chronic, secondary to cumulative UV radiation exposure/sun exposure over time - diffuse scaly erythematous macules with underlying dyspigmentation - Recommend daily broad spectrum sunscreen SPF 30+ to sun-exposed areas, reapply every 2 hours as needed.  - Recommend staying in the shade or wearing long sleeves, sun glasses (UVA+UVB protection) and wide brim hats (4-inch brim around the entire circumference of the hat). - Call for  new or changing lesions.  SEBORRHEIC KERATOSIS - Stuck-on, waxy, tan-brown papules and/or plaques trunk - Benign-appearing - Discussed benign etiology and prognosis. - Observe - Call for any changes   ROSACEA Exam: erythema with telangiectasias on the medial cheeks.  Chronic and persistent condition with duration or expected duration over one year. Condition is bothersome/symptomatic for patient. Currently flared.   Rosacea is a chronic progressive skin condition usually affecting the face of adults, causing redness and/or acne bumps. It is treatable but not curable. It sometimes affects the eyes (ocular rosacea) as well. It may respond to topical and/or systemic medication and can flare with stress, sun exposure, alcohol, exercise, topical steroids (including hydrocortisone /cortisone 10) and some foods.  Daily application of broad spectrum spf 30+ sunscreen to face is recommended to reduce flares.  Patient denies grittiness of the eyes.  Treatment Plan Start Finacea apply BID to face for rosacea. Can be morning and night or once a day. May substitute for gel if needed for insurance.   Discussed Rhofade, but not covered by insurance For temporary redness reduction Mix 1 bottle of Afrin original in 1 bottle of Cerave PM, shake well, and apply 1 hour before you want redness to be improved in the morning   Counseling for BBL / IPL / Laser and Coordination of Care Discussed the treatment option of Broad Band Light (BBL) Barron Lien Pulsed Light (IPL)/ Laser for skin discoloration, including brown spots and redness.  Typically we recommend at least 1-3 treatment sessions about 5-8 weeks apart for best results.  Cannot have tanned  skin when BBL performed, and regular use of sunscreen/photoprotection is advised after the procedure to help maintain results. The patient's condition may also require "maintenance treatments" in the future.  The fee for BBL / laser treatments is $350 per treatment  session for the whole face.  A fee can be quoted for other parts of the body.  Insurance typically does not pay for BBL/laser treatments and therefore the fee is an out-of-pocket cost. Recommend prophylactic valtrex treatment. Once scheduled for procedure, will send Rx in prior to patient's appointment.     ANDROGENETIC ALOPECIA (FEMALE PATTERN HAIR LOSS) Exam: Diffuse thinning of the crown and widening of the midline part with retention of the frontal hairline  Chronic and persistent condition with duration or expected duration over one year. Condition is bothersome/symptomatic for patient. Currently flared.   Female Androgenic Alopecia is a chronic condition related to genetics and/or hormonal changes.  In women androgenetic alopecia is commonly associated with menopause but may occur any time after puberty.  It causes hair thinning primarily on the crown with widening of the part and temporal hairline recession.  Can use OTC Rogaine (minoxidil) 5% solution/foam as directed.  Oral treatments in female patients who have no contraindication may include : - Low dose oral minoxidil 1.25 - 5mg  daily - Spironolactone 50 - 100mg  bid - Finasteride 2.5 - 5 mg daily Adjunctive therapies include: - Low Level Laser Light Therapy (LLLT) - Platelet-rich plasma injections (PRP) - Hair Transplants or scalp reduction   Treatment Plan: Patient advised this would require a separate appointment to discuss treatment options. Patient will schedule appointment for this.    POISON OAK Chin, forearms Contact Dermatitis Start Clobetasol cream, apply BID to aa face, body. Apply to aa chin no more than a week. Avoid applying around eyes. Avoid applying to areas on face with rosacea to avoid flare.   Topical steroids (such as triamcinolone, fluocinolone, fluocinonide, mometasone, clobetasol, halobetasol, betamethasone, hydrocortisone ) can cause thinning and lightening of the skin if they are used for too long in  the same area. Your physician has selected the right strength medicine for your problem and area affected on the body. Please use your medication only as directed by your physician to prevent side effects.   Discussed if spreads further and not resolving with clobetasol cream, patient will call and let us  know and can send in oral prednisone.   Risks of prednisone taper include mood irritability, insomnia, weight gain, stomach ulcers, increased risk of infection, increased blood sugar (diabetes), hypertension, osteoporosis with long-term or frequent use, and rare risk of avascular necrosis of the hip.    INFLAMED SEBORRHEIC KERATOSIS (15) R lower axilla x2, R inframammary x4, R ear x 1, L inframammary x4, L flank x2, L lower abdomen x1, R inguinal crease x1 (15) Symptomatic, irritating, patient would like treated. Destruction of lesion - R lower axilla x2, R inframammary x4, R ear x 1, L inframammary x4, L flank x2, L lower abdomen x1, R inguinal crease x1 (15)  Destruction method: cryotherapy   Informed consent: discussed and consent obtained   Lesion destroyed using liquid nitrogen: Yes   Region frozen until ice ball extended beyond lesion: Yes   Outcome: patient tolerated procedure well with no complications   Post-procedure details: wound care instructions given   Additional details:  Prior to procedure, discussed risks of blister formation, small wound, skin dyspigmentation, or rare scar following cryotherapy. Recommend Vaseline ointment to treated areas while healing.  SEBORRHEIC KERATOSIS Left  lower eyelid Reassured benign age-related growth.  Recommend observation.  Discussed ship excision or cryotherapy if spot(s) become irritated or inflamed.   Patient defers treatment today, will monitor.   Return in about 2 months (around 02/26/2024) for w/ Dr. Annette Barters, hairloss, ISK f/u .  I, Jacquelynn V. Grier Leber, CMA, am acting as scribe for Artemio Larry, MD .   Documentation: I have reviewed  the above documentation for accuracy and completeness, and I agree with the above.  Artemio Larry, MD

## 2023-12-27 NOTE — Patient Instructions (Addendum)
 For poison oak: Start Clobetasol cream, apply twice a day to aa. Apply to chin no more than a week at a time. Avoid applying around eyes. Avoid applying to areas with rosacea to avoid irritation.   Topical steroids (such as triamcinolone, fluocinolone, fluocinonide, mometasone, clobetasol, halobetasol, betamethasone, hydrocortisone ) can cause thinning and lightening of the skin if they are used for too long in the same area. Your physician has selected the right strength medicine for your problem and area affected on the body. Please use your medication only as directed by your physician to prevent side effects.   For rosacea: Start Finacea apply twice a day to face for rosacea. Can be morning and night or once a day.   For temporary redness reduction Mix 1 bottle of Afrin original in 1 bottle of Cerave PM, shake well, and apply 1 hour before you want redness to be improved in the morning    Cryotherapy Aftercare  Wash gently with soap and water everyday.   Apply Vaseline and Band-Aid daily until healed.    Due to recent changes in healthcare laws, you may see results of your pathology and/or laboratory studies on MyChart before the doctors have had a chance to review them. We understand that in some cases there may be results that are confusing or concerning to you. Please understand that not all results are received at the same time and often the doctors may need to interpret multiple results in order to provide you with the best plan of care or course of treatment. Therefore, we ask that you please give us  2 business days to thoroughly review all your results before contacting the office for clarification. Should we see a critical lab result, you will be contacted sooner.   If You Need Anything After Your Visit  If you have any questions or concerns for your doctor, please call our main line at [phone] and press option 4 to reach your doctor's medical assistant. If no one answers,  please leave a voicemail as directed and we will return your call as soon as possible. Messages left after 4 pm will be answered the following business day.   You may also send us  a message via MyChart. We typically respond to MyChart messages within 1-2 business days.  For prescription refills, please ask your pharmacy to contact our office. Our fax number is [phone].  If you have an urgent issue when the clinic is closed that cannot wait until the next business day, you can page your doctor at the number below.    Please note that while we do our best to be available for urgent issues outside of office hours, we are not available 24/7.   If you have an urgent issue and are unable to reach us , you may choose to seek medical care at your doctor's office, retail clinic, urgent care center, or emergency room.  If you have a medical emergency, please immediately call 911 or go to the emergency department.  Pager Numbers  - Dr. Bary Likes: [phone]  - Dr. Annette Barters: [phone]  - Dr. Felipe Horton: [phone]   In the event of inclement weather, please call our main line at [phone] for an update on the status of any delays or closures.  Dermatology Medication Tips: Please keep the boxes that topical medications come in in order to help keep track of the instructions about where and how to use these. Pharmacies typically print the medication instructions only on the boxes and not directly on  the medication tubes.   If your medication is too expensive, please contact our office at [phone] option 4 or send us  a message through MyChart.   We are unable to tell what your co-pay for medications will be in advance as this is different depending on your insurance coverage. However, we may be able to find a substitute medication at lower cost or fill out paperwork to get insurance to cover a needed medication.   If a prior authorization is required to get your medication covered by your  insurance company, please allow us  1-2 business days to complete this process.  Drug prices often vary depending on where the prescription is filled and some pharmacies may offer cheaper prices.  The website www.goodrx.com contains coupons for medications through different pharmacies. The prices here do not account for what the cost may be with help from insurance (it may be cheaper with your insurance), but the website can give you the price if you did not use any insurance.  - You can print the associated coupon and take it with your prescription to the pharmacy.  - You may also stop by our office during regular business hours and pick up a GoodRx coupon card.  - If you need your prescription sent electronically to a different pharmacy, notify our office through [facility] or by phone at [phone] option 4.     Si Usted Necesita Algo Despus de Su Visita  Tambin puede enviarnos un mensaje a travs de Clinical cytogeneticist. Por lo general respondemos a los mensajes de MyChart en el transcurso de 1 a 2 das hbiles.  Para renovar recetas, por favor pida a su farmacia que se ponga en contacto con nuestra oficina. Franz Jacks de fax es [place] [phone].  Si tiene un asunto urgente cuando la clnica est cerrada y que no puede esperar hasta el siguiente da hbil, puede llamar/localizar a su doctor(a) al nmero que aparece a continuacin.   Por favor, tenga en cuenta que aunque hacemos todo lo posible para estar disponibles para asuntos urgentes fuera del horario de [place], no estamos disponibles las 24 horas del da, los 7 [address]  [address] de la [place].   Si tiene un problema urgente y no puede comunicarse con nosotros, puede optar por buscar atencin mdica  en el consultorio de su doctor(a), en una clnica privada, en un centro de atencin urgente o en una sala de emergencias.  Si tiene Engineer, drilling, por favor llame inmediatamente al 911 o vaya a la sala de emergencias.  Nmeros de  bper  - Dr. Bary Likes: [phone]  - Dra. Annette Barters: [phone]  - Dr. Felipe Horton: [phone]   En caso de inclemencias del tiempo, por favor llame a [place] principal al [phone] para una actualizacin sobre el [place] de cualquier retraso o cierre.  Consejos para la medicacin en dermatologa: Por favor, guarde las cajas en las que vienen los medicamentos de uso tpico para ayudarle a seguir las instrucciones sobre dnde y cmo usarlos. Las farmacias generalmente imprimen las instrucciones del medicamento slo en las cajas y no directamente en los tubos del [place].   Si su medicamento es muy caro, por favor, pngase en contacto con Bettyjane Brunet llamando al [phone] y presione la opcin 4 o envenos un mensaje a travs de Clinical cytogeneticist.   No podemos decirle cul ser su copago por los medicamentos por adelantado ya que esto es diferente dependiendo de la cobertura de su seguro. Sin embargo, es posible que podamos encontrar un medicamento sustituto  a Audiological scientist un formulario para que el seguro cubra el medicamento que se considera necesario.   Si se requiere una autorizacin previa para que su compaa de seguros [place] su medicamento, por favor permtanos de 1 a 2 das hbiles para completar este proceso.  Los precios de los medicamentos varan con frecuencia dependiendo del Environmental consultant de dnde se surte la receta y alguna farmacias pueden ofrecer precios ms baratos.  El sitio web www.goodrx.com tiene cupones para medicamentos de Health and safety inspector. Los precios aqu no tienen en cuenta lo que podra costar con la ayuda del seguro (puede ser ms barato con su seguro), pero el sitio web puede darle el precio si no utiliz Tourist information centre manager.  - Puede imprimir el cupn correspondiente y llevarlo con su receta a la farmacia.  - Tambin puede pasar por nuestra oficina durante el horario de atencin regular y Education officer, museum una tarjeta de cupones de GoodRx.  - Si necesita que su receta se  enve electrnicamente a una farmacia diferente, informe a nuestra oficina a travs de MyChart de [place] o por telfono llamando al [phone] y presione la opcin 4.

## 2024-02-21 ENCOUNTER — Telehealth: Payer: Self-pay | Admitting: Cardiovascular Disease

## 2024-02-21 NOTE — Telephone Encounter (Signed)
 T requesting orders for Potassium and Magnesium  to be checked. Please advise

## 2024-02-21 NOTE — Telephone Encounter (Signed)
 Spoke with patient. Patient asking if she needs her Potassium and Magnesium  levels checked before upcoming appointment. Patient informed that there are no orders for labs at this time. That they can discuss if she will need labs drawn at her upcoming appointment. Patient verbalizes understanding.

## 2024-03-06 NOTE — Progress Notes (Signed)
 Cardiology Clinic Note   Date: 03/08/2024 ID: RIHAM POLYAKOV, DOB Sep 23, 1945, MRN [id number]  [facility] Providers Cardiologist:  Evalene Lunger, MD Electrophysiologist:  OLE ONEIDA HOLTS, MD   Chief Complaint   Colleen Williamson is a 78 y.o. female who presents to the clinic today for routine follow up.   Patient Profile   Colleen Williamson is followed by Dr. Gollan and Dr. HOLTS for the history outlined below.      Past medical history significant for: PAF/a-flutter. Onset postop MVR 2015. DCCV 09/21/2014. DCCV 11/28/2014. A-fib ablation 12/27/2014. DCCV 07/13/2016. DCCV 02/26/2023. A-fib ablation/PVI 06/10/2023. TEE/DCCV 11/16/2023. PSVT. Mitral valve prolapse. S/p mitral valve repair 2015 at [facility]. TEE 11/16/2023: EF 40 to 45%.  Global hypokinesis.  Normal RV size/function.  Moderate LAE.  No LA/LAA thrombus.  Mild MR.  Aortic valve sclerosis/calcification without stenosis. Hypertension. Hyperlipidemia. Lipid panel 09/22/2023: LDL 81, HDL 58, TG 73, total 154. CVA. Occurred postop MVR 2015. Lymphedema.  In summary, patient underwent mitral valve repair in 2015 at [facility].  Postoperatively she developed A-fib with subsequent embolic stroke.  She underwent DCCV in February and May 2016.  She had an A-fib catheter ablation in June 2016. DCCV December 2017 and August 2024. She underwent afib ablation/PVI in November 2024.   She presented to the office on 11/15/2023 and was found to be in symptomatic SVT. She reported missed doses of Eliquis . Her heart rate was 160 bpm and BP 90/40. Given ongoing arrhythmia and hemodynamic instability she was admitted to the hospital for possible TEE cardioversion. She underwent the cardioversion and then was loaded on Tikosyn . She was discharged on 11/19/2023. She followed up with afib clinic on 5/2 and was maintaining sinus rhythm.   Patient was last seen in the office by Chantal Needle, NP on 12/22/2023 for follow-up after Tikosyn  initiation.   She was maintaining sinus rhythm with stable QTc.  No changes were made.     History of Present Illness    Today, patient is doing well. Patient denies lower extremity edema, orthopnea or PND. No chest pain, pressure, or tightness. She reports occasional awareness on the right side of her chest that lasts a brief second and goes away. It is not painful and is not associated with anything in particular. It is not anything she is worried about. She has cardiac awareness when she is in afib and denies recent episodes. She will feel an occasional flutter but nothing sustained. She is walking more and feels dyspnea is improving. She questions if she should continue vitamin D and B12. Discussed there were no interactions with Tikosyn . Suggested she speak with PCP about getting vitamin D level checked and recent result of b12 level. Patient expressed understanding. She would like to have her potassium and magnesium  checked today.     ROS: All other systems reviewed and are otherwise negative except as noted in History of Present Illness.  EKGs/Labs Reviewed       EKG is not ordered today.   11/15/2023: ALT 23; AST 36 11/26/2023: BUN 14; Creatinine, Ser 0.68; Potassium 5.2; Sodium 147   11/15/2023: Hemoglobin 13.5; WBC 5.5   03/16/2023: TSH 1.160    Risk Assessment/Calculations     CHA2DS2-VASc Score = 6   This indicates a 9.7% annual risk of stroke. The patient's score is based upon: CHF History: 0 HTN History: 1 Diabetes History: 0 Stroke History: 2 Vascular Disease History: 0 Age Score: 2 Gender Score: 1  Physical Exam    VS:  BP 128/82 (BP Location: Left Arm, Patient Position: Sitting, Cuff Size: Normal)   Pulse 79   Resp 18   Ht 5' 8 (1.727 m)   Wt 139 lb 9.6 oz (63.3 kg)   SpO2 96%   BMI 21.23 kg/m  , BMI Body mass index is 21.23 kg/m.  GEN: Well nourished, well developed, in no acute distress. Neck: No JVD or carotid bruits. Cardiac:  RRR.  No murmur.  No rubs or gallops.   Respiratory:  Respirations regular and unlabored. Clear to auscultation without rales, wheezing or rhonchi. GI: Soft, nontender, nondistended. Extremities: Radials/DP/PT 2+ and equal bilaterally. No clubbing or cyanosis. No edema.  Skin: Warm and dry, no rash. Neuro: Strength intact.  Assessment & Plan   PAF/a-flutter S/p A-fib ablation in June 2016 and November 2024.  Multiple DCCV's.  Last TEE DCCV 11/16/2023.  Patient was loaded on Tikosyn  at that time.  Denies spontaneous bleeding concerns.  She reports cardiac awarness of afib. She denies recent episodes. She has an occasional, brief flutter but nothing sustained. Labs from May 2025 demonstrated magnesium  of 2 and potassium of 4.8. - Continue Eliquis , Tikosyn , potassium. - BMP and Mg today.  - Keep previously scheduled follow up with Dr. Cindie in September.   Mitral valve prolapse/mildly reduced EF. S/p mitral valve repair 2015 at [facility].  TEE April 2025 demonstrated mild MR.  Patient denies lower extremity edema, lightheadedness, dizziness, presyncope or syncope.  - Consider repeat echo at follow up.   Hypertension BP today 128/82. No report of headaches or dizziness.  - Continue to monitor.   Hyperlipidemia LDL 81 in February 2025. - Continue rosuvastatin. - Continue to follow with PCP.  Disposition: BMP and Mg today. Keep previously scheduled follow up with EP in September. Return in 1 year or sooner as needed.          Signed, Barnie HERO. Jakyri Brunkhorst, DNP, NP-C

## 2024-03-08 ENCOUNTER — Encounter: Payer: Self-pay | Admitting: Student

## 2024-03-08 ENCOUNTER — Ambulatory Visit: Attending: Student | Admitting: Student

## 2024-03-08 VITALS — BP 128/82 | HR 79 | Resp 18 | Ht 68.0 in | Wt 139.6 lb

## 2024-03-08 DIAGNOSIS — E7800 Pure hypercholesterolemia, unspecified: Secondary | ICD-10-CM | POA: Diagnosis not present

## 2024-03-08 DIAGNOSIS — Z79899 Other long term (current) drug therapy: Secondary | ICD-10-CM

## 2024-03-08 DIAGNOSIS — I480 Paroxysmal atrial fibrillation: Secondary | ICD-10-CM | POA: Diagnosis not present

## 2024-03-08 DIAGNOSIS — I341 Nonrheumatic mitral (valve) prolapse: Secondary | ICD-10-CM

## 2024-03-08 DIAGNOSIS — I10 Essential (primary) hypertension: Secondary | ICD-10-CM | POA: Diagnosis not present

## 2024-03-08 DIAGNOSIS — Z9889 Other specified postprocedural states: Secondary | ICD-10-CM

## 2024-03-08 DIAGNOSIS — I483 Typical atrial flutter: Secondary | ICD-10-CM | POA: Diagnosis not present

## 2024-03-08 MED ORDER — DOFETILIDE 250 MCG PO CAPS: 250.0000 ug | ORAL_CAPSULE | Freq: Two times a day (BID) | ORAL | 3 refills | Status: DC

## 2024-03-08 NOTE — Patient Instructions (Signed)
 Medication Instructions:  Your physician recommends that you continue on your current medications as directed. Please refer to the Current Medication list given to you today.   *If you need a refill on your cardiac medications before your next appointment, please call your pharmacy*  Lab Work: Your provider would like for you to have following labs drawn today BMet, Magnesium .   If you have labs (blood work) drawn today and your tests are completely normal, you will receive your results only by: MyChart Message (if you have MyChart) OR A paper copy in the mail If you have any lab test that is abnormal or we need to change your treatment, we will call you to review the results.  Testing/Procedures: None ordered at this time   Follow-Up: At [facility], you and your health needs are our priority.  As part of our continuing mission to provide you with exceptional heart care, our providers are all part of one team.  This team includes your primary Cardiologist (physician) and Advanced Practice Providers or APPs (Physician Assistants and Nurse Practitioners) who all work together to provide you with the care you need, when you need it.  Your next appointment:   1 year(s)  Provider:   Evalene Lunger, MD or Barnie Hila, NP    We recommend signing up for the patient portal called MyChart.  Sign up information is provided on this After Visit Summary.  MyChart is used to connect with patients for Virtual Visits (Telemedicine).  Patients are able to view lab/test results, encounter notes, upcoming appointments, etc.  Non-urgent messages can be sent to your provider as well.   To learn more about what you can do with MyChart, go to [email or web address].

## 2024-03-09 LAB — MAGNESIUM: Magnesium: 2 mg/dL (ref 1.6–2.3)

## 2024-03-09 LAB — BASIC METABOLIC PANEL WITH GFR
BUN/Creatinine Ratio: 18 (ref 12–28)
BUN: 14 mg/dL (ref 8–27)
CO2: 23 mmol/L (ref 20–29)
Calcium: 9.6 mg/dL (ref 8.7–10.3)
Chloride: 102 mmol/L (ref 96–106)
Creatinine, Ser: 0.76 mg/dL (ref 0.57–1.00)
Glucose: 76 mg/dL (ref 70–99)
Potassium: 4.3 mmol/L (ref 3.5–5.2)
Sodium: 140 mmol/L (ref 134–144)
eGFR: 81 mL/min/1.73 (ref >59)

## 2024-03-10 ENCOUNTER — Ambulatory Visit: Payer: Self-pay | Admitting: Student

## 2024-03-14 ENCOUNTER — Encounter: Payer: Self-pay | Admitting: Cardiology

## 2024-03-14 ENCOUNTER — Ambulatory Visit (INDEPENDENT_AMBULATORY_CARE_PROVIDER_SITE_OTHER): Admitting: Dermatology

## 2024-03-14 DIAGNOSIS — L820 Inflamed seborrheic keratosis: Secondary | ICD-10-CM | POA: Diagnosis not present

## 2024-03-14 DIAGNOSIS — L649 Androgenic alopecia, unspecified: Secondary | ICD-10-CM | POA: Diagnosis not present

## 2024-03-14 NOTE — Progress Notes (Signed)
 Follow-Up Visit   Subjective  Colleen Williamson is a 79 y.o. female who presents for the following: 2 month follow-up inflamed Sks. She still has some residual areas and new irritated spots to be treated. She also has hair loss that started in 2015 when she was having several operations. She has tried Biotin in the past, not currently taking. No hair loss in parents or siblings. Scalp not itchy, doesn't burn, no flaking.   The patient has spots, moles and lesions to be evaluated, some may be new or changing and the patient may have concern these could be cancer.   The following portions of the chart were reviewed this encounter and updated as appropriate: medications, allergies, medical history  Review of Systems:  No other skin or systemic complaints except as noted in HPI or Assessment and Plan.  Objective  Well appearing patient in no apparent distress; mood and affect are within normal limits.  A focused examination was performed of the following areas: Trunk, scalp  Relevant physical exam findings are noted in the Assessment and Plan.  L mid back x 4, R lower back x 1, R axilla x 3, R forearm x 1 (9) Erythematous stuck-on, waxy papule or plaque  Assessment & Plan  ANDROGENETIC ALOPECIA (FEMALE PATTERN HAIR LOSS) Exam: Mild thinning of the crown and widening of the midline part with retention of the frontal hairline  Chronic and persistent condition with duration or expected duration over one year. Condition is symptomatic / bothersome to patient. Not to goal.  Female Androgenic Alopecia is a chronic condition related to genetics and/or hormonal changes.  In women androgenetic alopecia is commonly associated with menopause but may occur any time after puberty.  It causes hair thinning primarily on the crown with widening of the part and temporal hairline recession.  Can use OTC Rogaine (minoxidil) 5% solution/foam as directed.  Oral treatments in female patients who have no  contraindication may include : - Low dose oral minoxidil 1.25 - 5mg  daily - Spironolactone 50 - 100mg  bid - Finasteride 2.5 - 5 mg daily Adjunctive therapies include: - Low Level Laser Light Therapy (LLLT) - Platelet-rich plasma injections (PRP) - Hair Transplants or scalp reduction   No personal history of breast cancer.   Treatment Plan: Discussed starting minoxidil and finasteride. Patient will discuss with her cardiologist and if ok, she will call back for prescriptions. Would need baseline photos of scalp prior to starting TSH 09/22/2023 normal Hb 12/15/2023 normal   INFLAMED SEBORRHEIC KERATOSIS (9) L mid back x 4, R lower back x 1, R axilla x 3, R forearm x 1 (9) Symptomatic, irritating, patient would like treated. Destruction of lesion - L mid back x 4, R lower back x 1, R axilla x 3, R forearm x 1 (9)  Destruction method: cryotherapy   Informed consent: discussed and consent obtained   Lesion destroyed using liquid nitrogen: Yes   Region frozen until ice ball extended beyond lesion: Yes   Outcome: patient tolerated procedure well with no complications   Post-procedure details: wound care instructions given   Additional details:  Prior to procedure, discussed risks of blister formation, small wound, skin dyspigmentation, or rare scar following cryotherapy. Recommend Vaseline ointment to treated areas while healing.     Return if symptoms worsen or fail to improve.  LILLETTE Andrea Kerns, CMA, am acting as scribe for Rexene Rattler, MD .   Documentation: I have reviewed the above documentation for accuracy and completeness, and  I agree with the above.  Rexene Rattler, MD

## 2024-03-14 NOTE — Patient Instructions (Addendum)
 Female Androgenic Alopecia is a chronic condition related to genetics and/or hormonal changes.  In women androgenetic alopecia is commonly associated with menopause but may occur any time after puberty.  It causes hair thinning primarily on the crown with widening of the part and temporal hairline recession.  Can use OTC Rogaine (minoxidil) 5% solution/foam as directed.  Oral treatments in female patients who have no contraindication may include : - Low dose oral minoxidil 1.25 - 5mg  daily - Finasteride 2.5 - 5 mg daily - Spironolactone 50 - 100mg  bid Adjunctive therapies include: - Low Level Laser Light Therapy (LLLT) - Platelet-rich plasma injections (PRP) - Hair Transplants or scalp reduction   Cryotherapy Aftercare  Wash gently with soap and water everyday.   Apply Vaseline and Band-Aid daily until healed.    Due to recent changes in healthcare laws, you may see results of your pathology and/or laboratory studies on MyChart before the doctors have had a chance to review them. We understand that in some cases there may be results that are confusing or concerning to you. Please understand that not all results are received at the same time and often the doctors may need to interpret multiple results in order to provide you with the best plan of care or course of treatment. Therefore, we ask that you please give us  2 business days to thoroughly review all your results before contacting the office for clarification. Should we see a critical lab result, you will be contacted sooner.   If You Need Anything After Your Visit  If you have any questions or concerns for your doctor, please call our main line at [phone] and press option 4 to reach your doctor's medical assistant. If no one answers, please leave a voicemail as directed and we will return your call as soon as possible. Messages left after 4 pm will be answered the following business day.   You may also send us  a message via  MyChart. We typically respond to MyChart messages within 1-2 business days.  For prescription refills, please ask your pharmacy to contact our office. Our fax number is [phone].  If you have an urgent issue when the clinic is closed that cannot wait until the next business day, you can page your doctor at the number below.    Please note that while we do our best to be available for urgent issues outside of office hours, we are not available 24/7.   If you have an urgent issue and are unable to reach us , you may choose to seek medical care at your doctor's office, retail clinic, urgent care center, or emergency room.  If you have a medical emergency, please immediately call 911 or go to the emergency department.  Pager Numbers  - Dr. Hester: [phone]  - Dr. Jackquline: [phone]  - Dr. Claudene: [phone]   In the event of inclement weather, please call our main line at [phone] for an update on the status of any delays or closures.  Dermatology Medication Tips: Please keep the boxes that topical medications come in in order to help keep track of the instructions about where and how to use these. Pharmacies typically print the medication instructions only on the boxes and not directly on the medication tubes.   If your medication is too expensive, please contact our office at [phone] option 4 or send us  a message through MyChart.   We are unable to tell what your co-pay for medications will be in advance as this  is different depending on your insurance coverage. However, we may be able to find a substitute medication at lower cost or fill out paperwork to get insurance to cover a needed medication.   If a prior authorization is required to get your medication covered by your insurance company, please allow us  1-2 business days to complete this process.  Drug prices often vary depending on where the prescription is filled and some pharmacies may offer cheaper  prices.  The website www.goodrx.com contains coupons for medications through different pharmacies. The prices here do not account for what the cost may be with help from insurance (it may be cheaper with your insurance), but the website can give you the price if you did not use any insurance.  - You can print the associated coupon and take it with your prescription to the pharmacy.  - You may also stop by our office during regular business hours and pick up a GoodRx coupon card.  - If you need your prescription sent electronically to a different pharmacy, notify our office through [facility] or by phone at [phone] option 4.     Si Usted Necesita Algo Despus de Su Visita  Tambin puede enviarnos un mensaje a travs de Clinical cytogeneticist. Por lo general respondemos a los mensajes de MyChart en el transcurso de 1 a 2 das hbiles.  Para renovar recetas, por favor pida a su farmacia que se ponga en contacto con nuestra oficina. Randi lakes de fax es [place] [phone].  Si tiene un asunto urgente cuando la clnica est cerrada y que no puede esperar hasta el siguiente da hbil, puede llamar/localizar a su doctor(a) al nmero que aparece a continuacin.   Por favor, tenga en cuenta que aunque hacemos todo lo posible para estar disponibles para asuntos urgentes fuera del horario de [place], no estamos disponibles las 24 horas del da, los 7 [address]  [address] de la [place].   Si tiene un problema urgente y no puede comunicarse con nosotros, puede optar por buscar atencin mdica  en el consultorio de su doctor(a), en una clnica privada, en un centro de atencin urgente o en una sala de emergencias.  Si tiene Engineer, drilling, por favor llame inmediatamente al 911 o vaya a la sala de emergencias.  Nmeros de bper  - Dr. Hester: [phone]  - Dra. Jackquline: [phone]  - Dr. Claudene: [phone]   En caso de inclemencias del tiempo, por favor llame a landry capes principal al [phone]  para una actualizacin sobre el [place] de cualquier retraso o cierre.  Consejos para la medicacin en dermatologa: Por favor, guarde las cajas en las que vienen los medicamentos de uso tpico para ayudarle a seguir las instrucciones sobre dnde y cmo usarlos. Las farmacias generalmente imprimen las instrucciones del medicamento slo en las cajas y no directamente en los tubos del [place].   Si su medicamento es muy caro, por favor, pngase en contacto con landry rieger llamando al [phone] y presione la opcin 4 o envenos un mensaje a travs de Clinical cytogeneticist.   No podemos decirle cul ser su copago por los medicamentos por adelantado ya que esto es diferente dependiendo de la cobertura de su seguro. Sin embargo, es posible que podamos encontrar un medicamento sustituto a Audiological scientist un formulario para que el seguro cubra el medicamento que se considera necesario.   Si se requiere una autorizacin previa para que su compaa de seguros malta su medicamento, por favor permtanos de 1 a 2 das hbiles  para Production designer, theatre/television/film.  Los precios de los medicamentos varan con frecuencia dependiendo del Environmental consultant de dnde se surte la receta y alguna farmacias pueden ofrecer precios ms baratos.  El sitio web www.goodrx.com tiene cupones para medicamentos de Health and safety inspector. Los precios aqu no tienen en cuenta lo que podra costar con la ayuda del seguro (puede ser ms barato con su seguro), pero el sitio web puede darle el precio si no utiliz Tourist information centre manager.  - Puede imprimir el cupn correspondiente y llevarlo con su receta a la farmacia.  - Tambin puede pasar por nuestra oficina durante el horario de atencin regular y Education officer, museum una tarjeta de cupones de GoodRx.  - Si necesita que su receta se enve electrnicamente a una farmacia diferente, informe a nuestra oficina a travs de MyChart de [place] o por telfono llamando al [phone] y presione la opcin 4.

## 2024-03-28 NOTE — Progress Notes (Unsigned)
  Electrophysiology Office Follow up Visit Note:    Date:  03/29/2024   ID:  Colleen Williamson, DOB Sep 18, 1945, MRN [id number]  PCP:  Auston Reyes BIRCH, MD  [facility] Cardiologist:  Evalene Lunger, MD  [facility] Electrophysiologist:  OLE ONEIDA HOLTS, MD    Interval History:     Colleen Williamson is a 78 y.o. female who presents for a follow up visit.   He last saw Colleen Williamson Dec 22, 2023.  She has a history of mitral valve replacement in 2016 complicated by stroke, atrial fibrillation, atrial flutter, hypertension, SVT.  She is maintained on dofetilide .  She had a prior catheter ablation in November 2024.  She is doing well. No recurrence.      Past medical, surgical, social and family history were reviewed.  ROS:   Please see the history of present illness.    All other systems reviewed and are negative.  EKGs/Labs/Other Studies Reviewed:    The following studies were reviewed today:     EKG Interpretation Date/Time:  Wednesday March 29 2024 08:57:30 EDT Ventricular Rate:  67 PR Interval:  176 QRS Duration:  86 QT Interval:  426 QTC Calculation: 450 R Axis:   70  Text Interpretation: Normal sinus rhythm Confirmed by HOLTS OLE [phone]) on 03/29/2024 9:06:08 AM    Physical Exam:    VS:  BP 120/78 (BP Location: Left Arm, Patient Position: Sitting, Cuff Size: Normal)   Pulse 67   Ht 5' 8 (1.727 m)   Wt 139 lb (63 kg)   SpO2 96%   BMI 21.13 kg/m     Wt Readings from Last 3 Encounters:  03/29/24 139 lb (63 kg)  03/08/24 139 lb 9.6 oz (63.3 kg)  12/22/23 143 lb (64.9 kg)     GEN: no distress CARD: RRR, No MRG RESP: No IWOB. CTAB.      ASSESSMENT:    1. S/P MVR (mitral valve repair)   2. Encounter for long-term (current) use of high-risk medication   3. Persistent atrial fibrillation (HCC)   4. Atypical atrial flutter (HCC)    PLAN:    In order of problems listed above:  #Persistent atrial fibrillation flutter #High risk med  monitoring-dofetilide  QTc acceptable for ongoing dofetilide  use Continue Eliquis  for stroke prophylaxis   F/u 4 mo w APP.  Signed, OLE HOLTS, MD, [facility], [facility] 03/29/2024 9:10 AM    Electrophysiology [facility]

## 2024-03-29 ENCOUNTER — Encounter: Payer: Self-pay | Admitting: Cardiology

## 2024-03-29 ENCOUNTER — Ambulatory Visit: Attending: Cardiology | Admitting: Cardiology

## 2024-03-29 VITALS — BP 120/78 | HR 67 | Ht 68.0 in | Wt 139.0 lb

## 2024-03-29 DIAGNOSIS — Z9889 Other specified postprocedural states: Secondary | ICD-10-CM

## 2024-03-29 DIAGNOSIS — Z79899 Other long term (current) drug therapy: Secondary | ICD-10-CM | POA: Diagnosis not present

## 2024-03-29 DIAGNOSIS — I484 Atypical atrial flutter: Secondary | ICD-10-CM

## 2024-03-29 DIAGNOSIS — I4819 Other persistent atrial fibrillation: Secondary | ICD-10-CM

## 2024-03-29 NOTE — Patient Instructions (Signed)
 Medication Instructions:  Your physician recommends that you continue on your current medications as directed. Please refer to the Current Medication list given to you today.  *If you need a refill on your cardiac medications before your next appointment, please call your pharmacy*   Follow-Up: At [facility], you and your health needs are our priority.  As part of our continuing mission to provide you with exceptional heart care, our providers are all part of one team.  This team includes your primary Cardiologist (physician) and Advanced Practice Providers or APPs (Physician Assistants and Nurse Practitioners) who all work together to provide you with the care you need, when you need it.  Your next appointment:   4 months  Provider:   You will see one of the following Advanced Practice Providers on your designated Care Team:   Charlies Arthur, DEVONNA Sharper [address]" [place], PA-C Suzann Riddle, NP Daphne Barrack, NP

## 2024-05-09 DIAGNOSIS — E7800 Pure hypercholesterolemia, unspecified: Secondary | ICD-10-CM | POA: Diagnosis not present

## 2024-05-09 DIAGNOSIS — F419 Anxiety disorder, unspecified: Secondary | ICD-10-CM | POA: Diagnosis not present

## 2024-05-09 DIAGNOSIS — R8290 Unspecified abnormal findings in urine: Secondary | ICD-10-CM | POA: Diagnosis not present

## 2024-05-09 DIAGNOSIS — I10 Essential (primary) hypertension: Secondary | ICD-10-CM | POA: Diagnosis not present

## 2024-05-09 DIAGNOSIS — Z79899 Other long term (current) drug therapy: Secondary | ICD-10-CM | POA: Diagnosis not present

## 2024-05-09 DIAGNOSIS — I480 Paroxysmal atrial fibrillation: Secondary | ICD-10-CM | POA: Diagnosis not present

## 2024-05-10 ENCOUNTER — Encounter: Payer: Self-pay | Admitting: Cardiology

## 2024-05-11 ENCOUNTER — Other Ambulatory Visit: Payer: Self-pay | Admitting: Internal Medicine

## 2024-05-11 DIAGNOSIS — R17 Unspecified jaundice: Secondary | ICD-10-CM

## 2024-05-16 ENCOUNTER — Ambulatory Visit
Admission: RE | Admit: 2024-05-16 | Discharge: 2024-05-16 | Disposition: A | Discharge status: Home / Self Care | Source: Ambulatory Visit | Attending: Internal Medicine | Admitting: Internal Medicine

## 2024-05-16 DIAGNOSIS — R17 Unspecified jaundice: Secondary | ICD-10-CM | POA: Insufficient documentation

## 2024-05-16 DIAGNOSIS — K8020 Calculus of gallbladder without cholecystitis without obstruction: Secondary | ICD-10-CM | POA: Diagnosis not present

## 2024-05-16 DIAGNOSIS — N281 Cyst of kidney, acquired: Secondary | ICD-10-CM | POA: Diagnosis not present

## 2024-05-24 DIAGNOSIS — I480 Paroxysmal atrial fibrillation: Secondary | ICD-10-CM | POA: Diagnosis not present

## 2024-05-24 DIAGNOSIS — F419 Anxiety disorder, unspecified: Secondary | ICD-10-CM | POA: Diagnosis not present

## 2024-05-24 DIAGNOSIS — R634 Abnormal weight loss: Secondary | ICD-10-CM | POA: Diagnosis not present

## 2024-06-05 DIAGNOSIS — F419 Anxiety disorder, unspecified: Secondary | ICD-10-CM | POA: Diagnosis not present

## 2024-06-05 DIAGNOSIS — I480 Paroxysmal atrial fibrillation: Secondary | ICD-10-CM | POA: Diagnosis not present

## 2024-06-05 DIAGNOSIS — E7800 Pure hypercholesterolemia, unspecified: Secondary | ICD-10-CM | POA: Diagnosis not present

## 2024-06-05 DIAGNOSIS — I10 Essential (primary) hypertension: Secondary | ICD-10-CM | POA: Diagnosis not present

## 2024-06-09 ENCOUNTER — Telehealth: Payer: Self-pay | Admitting: Cardiovascular Disease

## 2024-06-09 MED ORDER — POTASSIUM CHLORIDE CRYS ER 10 MEQ PO TBCR: 10.0000 meq | EXTENDED_RELEASE_TABLET | Freq: Every day | ORAL | 3 refills | Status: AC

## 2024-06-09 NOTE — Telephone Encounter (Signed)
*  STAT* If patient is at the pharmacy, call can be transferred to refill team.   1. Which medications need to be refilled? (please list name of each medication and dose if known) potassium chloride  SA (KLOR-CON  M) 10 MEQ tablet   2. Which pharmacy/location (including street and city if local pharmacy) is medication to be sent to? [id number] - [place], [place] - [address]   3. Do they need a 30 day or 90 day supply? 90

## 2024-06-09 NOTE — Telephone Encounter (Signed)
 Refill sent.

## 2024-06-16 DIAGNOSIS — I480 Paroxysmal atrial fibrillation: Secondary | ICD-10-CM | POA: Diagnosis not present

## 2024-06-16 DIAGNOSIS — F321 Major depressive disorder, single episode, moderate: Secondary | ICD-10-CM | POA: Diagnosis not present

## 2024-06-16 DIAGNOSIS — F419 Anxiety disorder, unspecified: Secondary | ICD-10-CM | POA: Diagnosis not present

## 2024-06-16 NOTE — Progress Notes (Signed)
 Colleen Williamson is a  78 y.o. female who presents for  CHIEF COMPLAINT Chief Complaint  Patient presents with  . Follow-up  . Anxiety  . Depression  . Hypertension    Subjective: History of Present Illness  Pt in NAD. Here for persistent anxiety/depression on Zoloft  and Buspar . Not sleeping well. Feels more anxious than depressed. Some palpitations. Denies CP or SOB. No change in bowels or bladder.    Past Medical History:  Diagnosis Date  . Anxiety   . Depression   . Heart palpitations   . History of bone density study 10/26/2013  . Mitral valve prolapse   . PVC's (premature ventricular contractions)    Patient Active Problem List  Diagnosis  . Mitral valve prolapse  . Heart palpitations  . Anxiety  . Depression  . Atrial flutter with rapid ventricular response (CMS/HHS-HCC)  . Limb pain  . Paroxysmal A-fib (CMS/HHS-HCC)  . Acute cardioembolic stroke (CMS/HHS-HCC)  . HTN, goal below 140/90  . Pure hypercholesterolemia    Past Surgical History:  Procedure Laterality Date  . COLECTOMY PARTIAL W/ANASTAMOSIS Right 2015  . CARDIOVERSION EXTERNAL  07/21/2016  . COLONOSCOPY Bilateral 05/15/2020   Normal examined colon/PHx CP/Repeat 69yrs/TKT  . CATARACT EXTRACTION    . COLONOSCOPY  11/23/2013, 08/27/2004   Dr. FABIENE Holmes @ [facility] - Adenomatous Polyps, colon mass - referred to surg  . HYSTERECTOMY     Total  . OOPHORECTOMY    . REPAIR MITRAL VALVE    . 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  amoxicillin (AMOXIL) 500 MG tablet, 6 tabs prior to dental procedure, Disp: 6 tablet, Rfl: 1 .  apixaban  (ELIQUIS ) 5 mg tablet, Take 1 tablet (5 mg total) by mouth 2 (two) times daily, Disp: 180 tablet, Rfl: 3 .  busPIRone  (BUSPAR ) 10 MG tablet, Take 1 tablet (10 mg total) by mouth 2 (two) times daily, Disp: 60 tablet, Rfl: 11 .  cholecalciferol , vitamin D3, (VITAMIN D3 ORAL), Take by mouth once daily., Disp: , Rfl:  .  dofetilide  (TIKOSYN ) 250  MCG capsule, Take 250 mcg by mouth 2 (two) times daily, Disp: , Rfl:  .  ipratropium (ATROVENT) 21 mcg (0.03 %) nasal spray, PLACE 2 SPRAYS INTO BOTH NOSTRILS 2 (TWO) TIMES DAILY, Disp: 30 mL, Rfl: 11 .  potassium chloride  (KLOR-CON  M10) 10 mEq ER tablet, Take 10 mEq by mouth once daily, Disp: , Rfl:  .  rosuvastatin  (CRESTOR ) 10 MG tablet, Take 1 tablet (10 mg total) by mouth once daily, Disp: 90 tablet, Rfl: 3 .  sertraline  (ZOLOFT ) 50 MG tablet, TAKE 1 TABLET BY MOUTH EVERY DAY, Disp: 90 tablet, Rfl: 1  Sulfa (sulfonamide antibiotics)  Social History   Socioeconomic History  . Marital status: Widowed  Tobacco Use  . Smoking status: Never  . Smokeless tobacco: Never  Vaping Use  . Vaping status: Never Used  Substance and Sexual Activity  . Alcohol use: No    Alcohol/week: 0.0 standard drinks of alcohol  . Drug use: No  . Sexual activity: Defer   Social Drivers of Health   Financial Resource Strain: Low Risk  (09/22/2023)   Overall Financial Resource Strain (CARDIA)   . Difficulty of Paying Living Expenses: Not hard at all  Food Insecurity: No Food Insecurity (11/16/2023)   [facility]   Hunger Vital Sign   . Within the past 12 months, you worried that your food would run out before  you got the money to buy more.: Never true   . Within the past 12 months, the food you bought just didn't last and you didn't have money to get more.: Never true  Transportation Needs: No Transportation Needs (11/16/2023)   Received from [facility] - Transportation   . Lack of Transportation (Medical): No   . Lack of Transportation (Non-Medical): No  Social Connections: Moderately Isolated (11/16/2023)   Received from [facility]   Social Connection and Isolation Panel   . In a typical week, how many times do you talk on the phone with family, friends, or neighbors?: More than three times a week   . How often do you get together with friends or relatives?: More than three  times a week   . How often do you attend church or religious services?: More than 4 times per year   . Do you belong to any clubs or organizations such as church groups, unions, fraternal or athletic groups, or school groups?: No   . How often do you attend meetings of the clubs or organizations you belong to?: Never   . Are you married, widowed, divorced, separated, never married, or living with a partner?: Widowed  Housing Stability: Unknown (09/22/2023)   Housing Stability Vital Sign   . Unable to Pay for Housing in the Last Year: No   . Homeless in the Last Year: No    Family History  Problem Relation Name Age of Onset  . Nephrolithiasis Mother    . Liver disease Father    . Cancer Father    . Breast cancer Paternal Aunt    . Diabetes type II Other    . Colon cancer Sister    . Ovarian cancer Sister      A comprehensive ROS was negative except for HPI  PE: BP (!) 142/90   Pulse 82   Ht 170.9 cm (5' 7.3)   Wt 60.3 kg (133 lb)   SpO2 96%   BMI 20.65 kg/m  The patient looks well today. She is in no distress. Neck is supple without adenopathy.  Carotids are 2+ bilaterally without bruits. Lungs are clear. Heart is regular rate and rhythm without murmurs, rubs, or gallops. Abdomen is soft, non tender, no masses or organomegaly. Lower extremities without obvious edema.   Office Visit on 05/24/2024  Component Date Value Ref Range Status  . Protein, Total 05/24/2024 6.7  6.1 - 7.9 g/dL Final  . Albumin 89/70/7974 4.5  3.5 - 4.8 g/dL Final  . Bilirubin, Total 05/24/2024 1.9 (H)  0.3 - 1.2 mg/dL Final  . Bilirubin, Conjugated 05/24/2024 0.36 (H)  0.00 - 0.20 mg/dL Final  . Alk Phos (alkaline Phosphatase) 05/24/2024 92  34 - 104 U/L Final  . AST  05/24/2024 21  8 - 39 U/L Final  . ALT  05/24/2024 12  5 - 38 U/L Final  . Gamma Glutamyl Transferase - [facility]* 05/24/2024 22  0 - 60 IU/L Final  Office Visit on 05/09/2024  Component Date Value Ref Range Status  . WBC (White Blood Cell  Count) 05/09/2024 4.3  4.1 - 10.2 10^3/uL Final  . RBC (Red Blood Cell Count) 05/09/2024 5.03  4.04 - 5.48 10^6/uL Final  . Hemoglobin 05/09/2024 14.5  12.0 - 15.0 gm/dL Final  . Hematocrit 89/85/7974 45.3  35.0 - 47.0 % Final  . MCV (Mean Corpuscular Volume) 05/09/2024 90.1  80.0 - 100.0 fl Final  . MCH (Mean Corpuscular Hemoglobin) 05/09/2024  28.8  27.0 - 31.2 pg Final  . MCHC (Mean Corpuscular Hemoglobin * 05/09/2024 32.0  32.0 - 36.0 gm/dL Final  . Platelet Count 05/09/2024 182  150 - 450 10^3/uL Final  . RDW-CV (Red Cell Distribution Widt* 05/09/2024 13.4  11.6 - 14.8 % Final  . MPV (Mean Platelet Volume) 05/09/2024 9.9  9.4 - 12.4 fl Final  . Neutrophils 05/09/2024 2.32  1.50 - 7.80 10^3/uL Final  . Lymphocytes 05/09/2024 1.34  1.00 - 3.60 10^3/uL Final  . Monocytes 05/09/2024 0.37  0.00 - 1.50 10^3/uL Final  . Eosinophils 05/09/2024 0.24  0.00 - 0.55 10^3/uL Final  . Basophils 05/09/2024 0.06  0.00 - 0.09 10^3/uL Final  . Neutrophil % 05/09/2024 53.5  32.0 - 70.0 % Final  . Lymphocyte % 05/09/2024 30.9  10.0 - 50.0 % Final  . Monocyte % 05/09/2024 8.5  4.0 - 13.0 % Final  . Eosinophil % 05/09/2024 5.5 (H)  1.0 - 5.0 % Final  . Basophil% 05/09/2024 1.4  0.0 - 2.0 % Final  . Immature Granulocyte % 05/09/2024 0.2  <=0.7 % Final  . Immature Granulocyte Count 05/09/2024 0.01  <=0.06 10^3/L Final  . Glucose 05/09/2024 83  70 - 110 mg/dL Final  . Sodium 89/85/7974 143  136 - 145 mmol/L Final  . Potassium 05/09/2024 4.3  3.6 - 5.1 mmol/L Final  . Chloride 05/09/2024 105  97 - 109 mmol/L Final  . Carbon Dioxide (CO2) 05/09/2024 28.7  22.0 - 32.0 mmol/L Final  . Urea Nitrogen (BUN) 05/09/2024 11  7 - 25 mg/dL Final  . Creatinine 89/85/7974 0.7  0.6 - 1.1 mg/dL Final  . Glomerular Filtration Rate (eGFR) 05/09/2024 90  >60 mL/min/1.73sq m Final  . Calcium  05/09/2024 9.8  8.7 - 10.3 mg/dL Final  . AST  89/85/7974 20  8 - 39 U/L Final  . ALT  05/09/2024 12  5 - 38 U/L Final  . Alk Phos  (alkaline Phosphatase) 05/09/2024 92  34 - 104 U/L Final  . Albumin 05/09/2024 4.5  3.5 - 4.8 g/dL Final  . Bilirubin, Total 05/09/2024 2.3 (H)  0.3 - 1.2 mg/dL Final  . Protein, Total 05/09/2024 6.8  6.1 - 7.9 g/dL Final  . A/G Ratio 89/85/7974 2.0  1.0 - 5.0 gm/dL Final  . Thyroid  Stimulating Hormone (TSH) 05/09/2024 1.119  0.450-5.330 uIU/ml uIU/mL Final  . Color 05/09/2024 Light Yellow  Colorless, Straw, Light Yellow, Yellow, Dark Yellow Final  . Clarity 05/09/2024 Clear  Clear Final  . Specific Gravity 05/09/2024 1.007  1.005 - 1.030 Final  . pH, Urine 05/09/2024 7.0  5.0 - 8.0 Final  . Protein, Urinalysis 05/09/2024 1+ (!)  Negative mg/dL Final  . Glucose, Urinalysis 05/09/2024 Negative  Negative mg/dL Final  . Ketones, Urinalysis 05/09/2024 Negative  Negative mg/dL Final  . Blood, Urinalysis 05/09/2024 Negative  Negative Final  . Nitrite, Urinalysis 05/09/2024 Negative  Negative Final  . Leukocyte Esterase, Urinalysis 05/09/2024 2+ (!)  Negative Final  . Bilirubin, Urinalysis 05/09/2024 Negative  Negative Final  . Urobilinogen, Urinalysis 05/09/2024 0.2  0.2 - 1.0 mg/dL Final  . WBC, UA 89/85/7974 17 (H)  <=5 /hpf Final  . Red Blood Cells, Urinalysis 05/09/2024 1  <=3 /hpf Final  . Bacteria, Urinalysis 05/09/2024 0-5  0 - 5 /hpf Final  . Squamous Epithelial Cells, Urinaly* 05/09/2024 0  /hpf Final  . Urine Culture, Routine - [facility] 05/09/2024 Final report   Final  . Result 1 - [facility] 05/09/2024 Comment  Final  Appointment on 12/23/2023  Component Date Value Ref Range Status  . Vitamin D, 25-Hydroxy - [facility] 12/23/2023 38.8  30.0 - 100.0 ng/mL Final  . Vitamin B12 12/23/2023 >1,500  >300 pg/mL Final  Office Visit on 12/15/2023  Component Date Value Ref Range Status  . WBC (White Blood Cell Count) 12/15/2023 4.5  4.1 - 10.2 10^3/uL Final  . RBC (Red Blood Cell Count) 12/15/2023 5.21  4.04 - 5.48 10^6/uL Final  . Hemoglobin 12/15/2023 15.0  12.0 - 15.0 gm/dL Final  .  Hematocrit 94/78/7974 46.6  35.0 - 47.0 % Final  . MCV (Mean Corpuscular Volume) 12/15/2023 89.4  80.0 - 100.0 fl Final  . MCH (Mean Corpuscular Hemoglobin) 12/15/2023 28.8  27.0 - 31.2 pg Final  . MCHC (Mean Corpuscular Hemoglobin * 12/15/2023 32.2  32.0 - 36.0 gm/dL Final  . Platelet Count 12/15/2023 202  150 - 450 10^3/uL Final  . RDW-CV (Red Cell Distribution Widt* 12/15/2023 13.9  11.6 - 14.8 % Final  . MPV (Mean Platelet Volume) 12/15/2023 10.3  9.4 - 12.4 fl Final  . Neutrophils 12/15/2023 2.23  1.50 - 7.80 10^3/uL Final  . Lymphocytes 12/15/2023 1.52  1.00 - 3.60 10^3/uL Final  . Monocytes 12/15/2023 0.41  0.00 - 1.50 10^3/uL Final  . Eosinophils 12/15/2023 0.22  0.00 - 0.55 10^3/uL Final  . Basophils 12/15/2023 0.07  0.00 - 0.09 10^3/uL Final  . Neutrophil % 12/15/2023 50.0  32.0 - 70.0 % Final  . Lymphocyte % 12/15/2023 34.1  10.0 - 50.0 % Final  . Monocyte % 12/15/2023 9.2  4.0 - 13.0 % Final  . Eosinophil % 12/15/2023 4.9  1.0 - 5.0 % Final  . Basophil% 12/15/2023 1.6  0.0 - 2.0 % Final  . Immature Granulocyte % 12/15/2023 0.2  <=0.7 % Final  . Immature Granulocyte Count 12/15/2023 0.01  <=0.06 10^3/L Final  . Glucose 12/15/2023 85  70 - 110 mg/dL Final  . Sodium 94/78/7974 141  136 - 145 mmol/L Final  . Potassium 12/15/2023 4.8  3.6 - 5.1 mmol/L Final  . Chloride 12/15/2023 104  97 - 109 mmol/L Final  . Carbon Dioxide (CO2) 12/15/2023 31.9  22.0 - 32.0 mmol/L Final  . Urea Nitrogen (BUN) 12/15/2023 19  7 - 25 mg/dL Final  . Creatinine 94/78/7974 0.6  0.6 - 1.1 mg/dL Final  . Glomerular Filtration Rate (eGFR) 12/15/2023 94  >60 mL/min/1.73sq m Final  . Calcium  12/15/2023 9.7  8.7 - 10.3 mg/dL Final  . AST  94/78/7974 24  8 - 39 U/L Final  . ALT  12/15/2023 15  5 - 38 U/L Final  . Alk Phos (alkaline Phosphatase) 12/15/2023 96  34 - 104 U/L Final  . Albumin 12/15/2023 4.4  3.5 - 4.8 g/dL Final  . Bilirubin, Total 12/15/2023 1.8 (H)  0.3 - 1.2 mg/dL Final  . Protein, Total  12/15/2023 7.0  6.1 - 7.9 g/dL Final  . A/G Ratio 94/78/7974 1.7  1.0 - 5.0 gm/dL Final  . Magnesium  12/15/2023 2.0  1.8 - 2.5 mg/dL Final  . Color 94/78/7974 Light Yellow  Colorless, Straw, Light Yellow, Yellow, Dark Yellow Final  . Clarity 12/15/2023 Clear  Clear Final  . Specific Gravity 12/15/2023 1.010  1.005 - 1.030 Final  . pH, Urine 12/15/2023 6.5  5.0 - 8.0 Final  . Protein, Urinalysis 12/15/2023 Negative  Negative mg/dL Final  . Glucose, Urinalysis 12/15/2023 Negative  Negative mg/dL Final  . Ketones, Urinalysis 12/15/2023 Negative  Negative mg/dL Final  .  Blood, Urinalysis 12/15/2023 Negative  Negative Final  . Nitrite, Urinalysis 12/15/2023 Negative  Negative Final  . Leukocyte Esterase, Urinalysis 12/15/2023 2+ (!)  Negative Final  . Bilirubin, Urinalysis 12/15/2023 Negative  Negative Final  . Urobilinogen, Urinalysis 12/15/2023 0.2  0.2 - 1.0 mg/dL Final  . WBC, UA 94/78/7974 10 (H)  <=5 /hpf Final  . Red Blood Cells, Urinalysis 12/15/2023 <1  <=3 /hpf Final  . Bacteria, Urinalysis 12/15/2023 0-5  0 - 5 /hpf Final  . Squamous Epithelial Cells, Urinaly* 12/15/2023 0  /hpf Final  . Urine Culture, Routine - [facility] 12/15/2023 Final report   Final  . Result 1 - [facility] 12/15/2023 Comment   Final  Office Visit on 09/22/2023  Component Date Value Ref Range Status  . WBC (White Blood Cell Count) 09/22/2023 3.6 (L)  4.1 - 10.2 10^3/uL Final  . RBC (Red Blood Cell Count) 09/22/2023 4.98  4.04 - 5.48 10^6/uL Final  . Hemoglobin 09/22/2023 14.8  12.0 - 15.0 gm/dL Final  . Hematocrit 97/73/7974 45.5  35.0 - 47.0 % Final  . MCV (Mean Corpuscular Volume) 09/22/2023 91.4  80.0 - 100.0 fl Final  . MCH (Mean Corpuscular Hemoglobin) 09/22/2023 29.7  27.0 - 31.2 pg Final  . MCHC (Mean Corpuscular Hemoglobin * 09/22/2023 32.5  32.0 - 36.0 gm/dL Final  . Platelet Count 09/22/2023 186  150 - 450 10^3/uL Final  . RDW-CV (Red Cell Distribution Widt* 09/22/2023 13.0  11.6 - 14.8 % Final  . MPV  (Mean Platelet Volume) 09/22/2023 10.1  9.4 - 12.4 fl Final  . Neutrophils 09/22/2023 1.56  1.50 - 7.80 10^3/uL Final  . Lymphocytes 09/22/2023 1.17  1.00 - 3.60 10^3/uL Final  . Monocytes 09/22/2023 0.48  0.00 - 1.50 10^3/uL Final  . Eosinophils 09/22/2023 0.29  0.00 - 0.55 10^3/uL Final  . Basophils 09/22/2023 0.06  0.00 - 0.09 10^3/uL Final  . Neutrophil % 09/22/2023 43.7  32.0 - 70.0 % Final  . Lymphocyte % 09/22/2023 32.8  10.0 - 50.0 % Final  . Monocyte % 09/22/2023 13.4 (H)  4.0 - 13.0 % Final  . Eosinophil % 09/22/2023 8.1 (H)  1.0 - 5.0 % Final  . Basophil% 09/22/2023 1.7  0.0 - 2.0 % Final  . Immature Granulocyte % 09/22/2023 0.3  <=0.7 % Final  . Immature Granulocyte Count 09/22/2023 0.01  <=0.06 10^3/L Final  . Glucose 09/22/2023 71  70 - 110 mg/dL Final  . Sodium 97/73/7974 142  136 - 145 mmol/L Final  . Potassium 09/22/2023 4.2  3.6 - 5.1 mmol/L Final  . Chloride 09/22/2023 105  97 - 109 mmol/L Final  . Carbon Dioxide (CO2) 09/22/2023 30.2  22.0 - 32.0 mmol/L Final  . Urea Nitrogen (BUN) 09/22/2023 23  7 - 25 mg/dL Final  . Creatinine 97/73/7974 0.6  0.6 - 1.1 mg/dL Final  . Glomerular Filtration Rate (eGFR) 09/22/2023 94  >60 mL/min/1.73sq m Final  . Calcium  09/22/2023 9.5  8.7 - 10.3 mg/dL Final  . AST  97/73/7974 34  8 - 39 U/L Final  . ALT  09/22/2023 25  5 - 38 U/L Final  . Alk Phos (alkaline Phosphatase) 09/22/2023 102  34 - 104 U/L Final  . Albumin 09/22/2023 4.2  3.5 - 4.8 g/dL Final  . Bilirubin, Total 09/22/2023 1.9 (H)  0.3 - 1.2 mg/dL Final  . Protein, Total 09/22/2023 6.4  6.1 - 7.9 g/dL Final  . A/G Ratio 97/73/7974 1.9  1.0 - 5.0 gm/dL Final  . Cholesterol,  Total 09/22/2023 154  100 - 200 mg/dL Final  . Triglyceride 97/73/7974 73  35 - 199 mg/dL Final  . HDL (High Density Lipoprotein) Cho* 09/22/2023 58.2  35.0 - 85.0 mg/dL Final  . LDL Calculated 09/22/2023 81  0 - 130 mg/dL Final  . VLDL Cholesterol 09/22/2023 15  mg/dL Final  . Cholesterol/HDL Ratio  09/22/2023 2.6   Final  . Thyroid  Stimulating Hormone (TSH) 09/22/2023 0.913  0.450-5.330 uIU/ml uIU/mL Final  . Thyroxine, Free (FT4) 09/22/2023 1.21 (H)  0.66 - 1.14 ng/dL Final  . Color 97/73/7974 Light Yellow  Colorless, Straw, Light Yellow, Yellow, Dark Yellow Final  . Clarity 09/22/2023 Clear  Clear Final  . Specific Gravity 09/22/2023 1.009  1.005 - 1.030 Final  . pH, Urine 09/22/2023 6.0  5.0 - 8.0 Final  . Protein, Urinalysis 09/22/2023 Negative  Negative mg/dL Final  . Glucose, Urinalysis 09/22/2023 Negative  Negative mg/dL Final  . Ketones, Urinalysis 09/22/2023 Negative  Negative mg/dL Final  . Blood, Urinalysis 09/22/2023 Negative  Negative Final  . Nitrite, Urinalysis 09/22/2023 Negative  Negative Final  . Leukocyte Esterase, Urinalysis 09/22/2023 2+ (!)  Negative Final  . Bilirubin, Urinalysis 09/22/2023 Negative  Negative Final  . Urobilinogen, Urinalysis 09/22/2023 0.2  0.2 - 1.0 mg/dL Final  . WBC, UA 97/73/7974 43 (H)  <=5 /hpf Final  . Red Blood Cells, Urinalysis 09/22/2023 1  <=3 /hpf Final  . Bacteria, Urinalysis 09/22/2023 0-5  0 - 5 /hpf Final  . Squamous Epithelial Cells, Urinaly* 09/22/2023 0  /hpf Final   DIAGNOSIS: Paroxysmal A-fib (CMS/HHS-HCC)  (primary encounter diagnosis)  Anxiety  Current moderate episode of major depressive disorder, unspecified whether recurrent (CMS-HCC)   PLAN: Stop Zoloft . Begin Inderal  10mg  po BID. Trial of Trazodone qhs. RTC 1 mo, sooner if needed     Attestation Statement:   I personally performed the service. (TP)  Reyes JONETTA Costa, MD, MD

## 2024-06-19 ENCOUNTER — Other Ambulatory Visit: Payer: Self-pay

## 2024-06-19 ENCOUNTER — Inpatient Hospital Stay
Admission: EM | Admit: 2024-06-19 | Discharge: 2024-06-21 | DRG: 310 | Disposition: A | Discharge status: Home / Self Care | Attending: Family Medicine | Admitting: Family Medicine

## 2024-06-19 ENCOUNTER — Emergency Department

## 2024-06-19 DIAGNOSIS — Z803 Family history of malignant neoplasm of breast: Secondary | ICD-10-CM

## 2024-06-19 DIAGNOSIS — R000 Tachycardia, unspecified: Secondary | ICD-10-CM | POA: Diagnosis not present

## 2024-06-19 DIAGNOSIS — Z87891 Personal history of nicotine dependence: Secondary | ICD-10-CM

## 2024-06-19 DIAGNOSIS — Z952 Presence of prosthetic heart valve: Secondary | ICD-10-CM

## 2024-06-19 DIAGNOSIS — I4819 Other persistent atrial fibrillation: Secondary | ICD-10-CM | POA: Diagnosis not present

## 2024-06-19 DIAGNOSIS — I480 Paroxysmal atrial fibrillation: Principal | ICD-10-CM | POA: Diagnosis present

## 2024-06-19 DIAGNOSIS — E785 Hyperlipidemia, unspecified: Secondary | ICD-10-CM | POA: Diagnosis present

## 2024-06-19 DIAGNOSIS — L719 Rosacea, unspecified: Secondary | ICD-10-CM | POA: Diagnosis present

## 2024-06-19 DIAGNOSIS — Z888 Allergy status to other drugs, medicaments and biological substances status: Secondary | ICD-10-CM

## 2024-06-19 DIAGNOSIS — I4892 Unspecified atrial flutter: Secondary | ICD-10-CM | POA: Diagnosis not present

## 2024-06-19 DIAGNOSIS — Z8673 Personal history of transient ischemic attack (TIA), and cerebral infarction without residual deficits: Secondary | ICD-10-CM | POA: Diagnosis not present

## 2024-06-19 DIAGNOSIS — R0602 Shortness of breath: Secondary | ICD-10-CM | POA: Diagnosis not present

## 2024-06-19 DIAGNOSIS — I10 Essential (primary) hypertension: Secondary | ICD-10-CM | POA: Diagnosis present

## 2024-06-19 DIAGNOSIS — F419 Anxiety disorder, unspecified: Secondary | ICD-10-CM | POA: Diagnosis not present

## 2024-06-19 DIAGNOSIS — Z833 Family history of diabetes mellitus: Secondary | ICD-10-CM

## 2024-06-19 DIAGNOSIS — I471 Supraventricular tachycardia, unspecified: Secondary | ICD-10-CM | POA: Diagnosis present

## 2024-06-19 DIAGNOSIS — R55 Syncope and collapse: Secondary | ICD-10-CM | POA: Diagnosis not present

## 2024-06-19 DIAGNOSIS — R002 Palpitations: Secondary | ICD-10-CM | POA: Diagnosis not present

## 2024-06-19 DIAGNOSIS — I340 Nonrheumatic mitral (valve) insufficiency: Secondary | ICD-10-CM | POA: Diagnosis not present

## 2024-06-19 DIAGNOSIS — Z90710 Acquired absence of both cervix and uterus: Secondary | ICD-10-CM

## 2024-06-19 DIAGNOSIS — Z882 Allergy status to sulfonamides status: Secondary | ICD-10-CM

## 2024-06-19 DIAGNOSIS — Z79899 Other long term (current) drug therapy: Secondary | ICD-10-CM

## 2024-06-19 DIAGNOSIS — I4891 Unspecified atrial fibrillation: Secondary | ICD-10-CM | POA: Diagnosis not present

## 2024-06-19 DIAGNOSIS — I484 Atypical atrial flutter: Principal | ICD-10-CM | POA: Diagnosis present

## 2024-06-19 DIAGNOSIS — G4700 Insomnia, unspecified: Secondary | ICD-10-CM | POA: Diagnosis present

## 2024-06-19 DIAGNOSIS — Z7901 Long term (current) use of anticoagulants: Secondary | ICD-10-CM

## 2024-06-19 LAB — CBC WITH DIFFERENTIAL/PLATELET
Abs Immature Granulocytes: 0.01 K/uL (ref 0.00–0.07)
Basophils Absolute: 0.1 K/uL (ref 0.0–0.1)
Basophils Relative: 2 %
Eosinophils Absolute: 0.2 K/uL (ref 0.0–0.5)
Eosinophils Relative: 3 %
HCT: 47.8 % — ABNORMAL HIGH (ref 36.0–46.0)
Hemoglobin: 15.6 g/dL — ABNORMAL HIGH (ref 12.0–15.0)
Immature Granulocytes: 0 %
Lymphocytes Relative: 24 %
Lymphs Abs: 1.4 K/uL (ref 0.7–4.0)
MCH: 28.8 pg (ref 26.0–34.0)
MCHC: 32.6 g/dL (ref 30.0–36.0)
MCV: 88.4 fL (ref 80.0–100.0)
Monocytes Absolute: 0.5 K/uL (ref 0.1–1.0)
Monocytes Relative: 9 %
Neutro Abs: 3.4 K/uL (ref 1.7–7.7)
Neutrophils Relative %: 62 %
Platelets: 170 K/uL (ref 150–400)
RBC: 5.41 MIL/uL — ABNORMAL HIGH (ref 3.87–5.11)
RDW: 13.2 % (ref 11.5–15.5)
WBC: 5.6 K/uL (ref 4.0–10.5)
nRBC: 0 % (ref 0.0–0.2)

## 2024-06-19 LAB — BASIC METABOLIC PANEL WITH GFR
Anion gap: 11 (ref 5–15)
BUN: 16 mg/dL (ref 8–23)
CO2: 27 mmol/L (ref 22–32)
Calcium: 9.6 mg/dL (ref 8.9–10.3)
Chloride: 103 mmol/L (ref 98–111)
Creatinine, Ser: 0.78 mg/dL (ref 0.44–1.00)
GFR, Estimated: >60 mL/min (ref >60)
Glucose, Bld: 118 mg/dL — ABNORMAL HIGH (ref 70–99)
Potassium: 4.1 mmol/L (ref 3.5–5.1)
Sodium: 140 mmol/L (ref 135–145)

## 2024-06-19 LAB — MAGNESIUM: Magnesium: 2 mg/dL (ref 1.7–2.4)

## 2024-06-19 LAB — TROPONIN T, HIGH SENSITIVITY: Troponin T High Sensitivity: 25 ng/L — ABNORMAL HIGH (ref 0–19)

## 2024-06-19 LAB — PRO BRAIN NATRIURETIC PEPTIDE: Pro Brain Natriuretic Peptide: 975 pg/mL — ABNORMAL HIGH (ref <300.0)

## 2024-06-19 MED ORDER — ACETAMINOPHEN 650 MG RE SUPP: 650.0000 mg | Freq: Four times a day (QID) | RECTAL | Status: DC | PRN

## 2024-06-19 MED ORDER — ONDANSETRON HCL 4 MG PO TABS: 4.0000 mg | ORAL_TABLET | Freq: Four times a day (QID) | ORAL | Status: DC | PRN

## 2024-06-19 MED ORDER — BISACODYL 5 MG PO TBEC: 5.0000 mg | DELAYED_RELEASE_TABLET | Freq: Every day | ORAL | Status: DC | PRN

## 2024-06-19 MED ORDER — APIXABAN 5 MG PO TABS
5.0000 mg | ORAL_TABLET | Freq: Two times a day (BID) | ORAL | Status: DC
  Administered 2024-06-19 – 2024-06-21 (×4): 5 mg via ORAL
  Filled 2024-06-19 (×4): qty 1

## 2024-06-19 MED ORDER — DOFETILIDE 250 MCG PO CAPS
250.0000 ug | ORAL_CAPSULE | Freq: Two times a day (BID) | ORAL | Status: DC
  Administered 2024-06-19 – 2024-06-21 (×4): 250 ug via ORAL
  Filled 2024-06-19 (×6): qty 1

## 2024-06-19 MED ORDER — BUSPIRONE HCL 10 MG PO TABS
10.0000 mg | ORAL_TABLET | Freq: Two times a day (BID) | ORAL | Status: DC
  Administered 2024-06-19 – 2024-06-21 (×4): 10 mg via ORAL
  Filled 2024-06-19 (×4): qty 1

## 2024-06-19 MED ORDER — ONDANSETRON HCL 4 MG/2ML IJ SOLN: 4.0000 mg | Freq: Four times a day (QID) | INTRAMUSCULAR | Status: DC | PRN

## 2024-06-19 MED ORDER — DILTIAZEM HCL-DEXTROSE 125-5 MG/125ML-% IV SOLN (PREMIX)
5.0000 mg/h | INTRAVENOUS | Status: DC
  Filled 2024-06-19: qty 125

## 2024-06-19 MED ORDER — DILTIAZEM HCL 25 MG/5ML IV SOLN
10.0000 mg | Freq: Once | INTRAVENOUS | Status: AC
  Administered 2024-06-19: 10 mg via INTRAVENOUS
  Filled 2024-06-19: qty 5

## 2024-06-19 MED ORDER — ROSUVASTATIN CALCIUM 10 MG PO TABS
10.0000 mg | ORAL_TABLET | Freq: Every day | ORAL | Status: DC
  Administered 2024-06-20 – 2024-06-21 (×2): 10 mg via ORAL
  Filled 2024-06-19 (×2): qty 1

## 2024-06-19 MED ORDER — ALBUTEROL SULFATE (2.5 MG/3ML) 0.083% IN NEBU: 2.5000 mg | INHALATION_SOLUTION | RESPIRATORY_TRACT | Status: DC | PRN

## 2024-06-19 MED ORDER — CLONAZEPAM 0.5 MG PO TABS: 0.5000 mg | ORAL_TABLET | Freq: Two times a day (BID) | ORAL | Status: DC | PRN

## 2024-06-19 MED ORDER — ACETAMINOPHEN 325 MG PO TABS: 650.0000 mg | ORAL_TABLET | Freq: Four times a day (QID) | ORAL | Status: DC | PRN

## 2024-06-19 MED ORDER — PROPRANOLOL HCL 20 MG PO TABS
10.0000 mg | ORAL_TABLET | Freq: Two times a day (BID) | ORAL | Status: DC
  Administered 2024-06-19: 10 mg via ORAL
  Filled 2024-06-19: qty 1

## 2024-06-19 MED ORDER — POLYETHYLENE GLYCOL 3350 17 G PO PACK
17.0000 g | PACK | Freq: Every day | ORAL | Status: DC
  Administered 2024-06-20: 17 g via ORAL
  Filled 2024-06-19: qty 1

## 2024-06-19 MED ORDER — METOPROLOL TARTRATE 25 MG PO TABS
25.0000 mg | ORAL_TABLET | Freq: Two times a day (BID) | ORAL | Status: DC
  Administered 2024-06-19 – 2024-06-20 (×2): 25 mg via ORAL
  Filled 2024-06-19 (×2): qty 1

## 2024-06-19 NOTE — H&P (Signed)
 History and Physical Colleen Williamson:[id number] DOB: 1946-07-04 DOA: 06/19/2024 PCP: Auston Reyes BIRCH, MD  Chief Complaint: lightheadedness, dizziness Historian: patient  HPI:  Colleen Williamson is a 78 y.o. female with a PMH significant for atrial flutter/fibrillation, CVA, HTN, anxiety, depression, MVP, HLD, lymphedema. At baseline, they live alone and are independent with ADLs.  They presented from home to the ED on 06/19/2024 with episode of dizziness and lightheadedness beginning this morning. She states that she was getting up from the table when she started to feel the sensation of lightheadedness and almost going to pass out. Endorses feeling racing heart. Has had similar sensations but not this strong. She takes eliquis  and tikosyn  for her afib and is very religious about taking them on time every day. She did take her morning doses but not with food as she normally does. She also had several anxiety medication changes recently including starting propranolol  10mg  BID and stopped sertraline . Was prescribed trazodone but decided to not take it for interference with her heart medications. She received adenosine  with EMS without significant change. She then received diltiazem  which controlled her rate and resolved her symptoms in ED. She remains in afib with rate around 90 and asymptomatic so is being admitted for observation.   Significant findings included: proBNP 975, WBC 5.6, hgb 15.6, unremarkable metabolic panel. Troponin 25. Mg++ normal  Chest xray: no active disease  Assessment/Plan Principal Problem:   Atrial flutter (HCC)   Atrial fibrillation/flutter- with RVR controlled on diltiazem . Drip was ordered in ED but not started as her HR was controlled with bolus dose.  - cardiology consulted - change propranolol  to metoprolol  - continuous telemetry  - continue apixaban    Anxiety- poorly controlled. Not acutely exacerbated currently.  - continue home meds- Buspar   H/o  CVA - continue statin  Past Medical History:  Diagnosis Date   Abnormal mammogram, unspecified 2014   Anxiety    Atypical atrial flutter (HCC)    a. 12/2014 s/p RFCA.   History of cardiac cath    a. 03/2014 Cath [facility]): Nl cors.   Mitral valve disorder    a. 03/2014 s/p MV repair [facility]); b. 06/2016 Echo: EF 55-60%, no rwma, Nl fxn'ing MV w/o stenosis. Mildly dil LA/RA.   PAF (paroxysmal atrial fibrillation) (HCC)    a. 06/2016 s/p DCCV; b.  CHA2DS2VASc = 4-->Eliquis . Rhythm mgmt w/ amio (prev intol to flecainide ).   PSVT (paroxysmal supraventricular tachycardia)    Stroke (HCC)    a. 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    Past Surgical History:  Procedure Laterality Date   ABDOMINAL HYSTERECTOMY  2004   ATRIAL FIBRILLATION ABLATION N/A 06/10/2023   Procedure: ATRIAL FIBRILLATION ABLATION;  Surgeon: Cindie Ole DASEN, MD;  [facility] INVAS[facility] CV LAB;  Service: Cardiovascular;  Laterality: N/A;   BREAST CYST ASPIRATION  15 YRS AGO   CARDIOVERSION N/A 09/21/2014   Procedure: CARDIOVERSION;  Surgeon: Maude JAYSON Emmer, MD;  Location: [facility] ENDOSCOPY;  Service: Cardiovascular;  Laterality: N/A;   CARDIOVERSION N/A 02/26/2023   Procedure: CARDIOVERSION;  Surgeon: Perla Evalene PARAS, MD;  Location: [facility] ORS;  Service: Cardiovascular;  Laterality: N/A;   CARDIOVERSION N/A 11/16/2023   Procedure: CARDIOVERSION;  Surgeon: Shlomo Wilbert SAUNDERS, MD;  Location: [facility] INVASIVE CV LAB;  Service: Cardiovascular;  Laterality: N/A;   COLONOSCOPY WITH PROPOFOL  N/A 05/15/2020   Procedure: COLONOSCOPY WITH PROPOFOL ;  Surgeon: Toledo, Ladell MARLA, MD;  Location: [facility] ENDOSCOPY;  Service: Endoscopy;  Laterality: N/A;  ELECTROPHYSIOLOGIC STUDY N/A 11/28/2014   Procedure: Cardioversion;  Surgeon: Vinie DELENA Jude, MD;  Location: [facility] INVASIVE CV LAB;  Service: Cardiovascular;  Laterality: N/A;   ELECTROPHYSIOLOGIC STUDY N/A 12/27/2014   Procedure: A-Flutter;  Surgeon: Danelle LELON Birmingham, MD;  Location: [facility] INVASIVE CV LAB;   Service: Cardiovascular;  Laterality: N/A;   ELECTROPHYSIOLOGIC STUDY N/A 11/28/2014   Procedure: Cardioversion;  Surgeon: Vinie DELENA Jude, MD;  Location: [facility] ORS;  Service: Cardiovascular;  Laterality: N/A;  Have discussed with Dr. Debby   ELECTROPHYSIOLOGIC STUDY N/A 07/13/2016   Procedure: Cardioversion;  Surgeon: Deatrice DELENA Cage, MD;  Location: [facility] ORS;  Service: Cardiovascular;  Laterality: N/A;   EYE SURGERY  2007   MITRAL VALVE REPAIR  05/2014   RADIOLOGY WITH ANESTHESIA N/A 07/03/2014   Procedure: RADIOLOGY WITH ANESTHESIA;  Surgeon: Thyra MARLA Nash, MD;  Location: [facility] OR;  Service: Radiology;  Laterality: N/A;   TRANSESOPHAGEAL ECHOCARDIOGRAM (CATH LAB) N/A 11/16/2023   Procedure: TRANSESOPHAGEAL ECHOCARDIOGRAM;  Surgeon: Shlomo Wilbert SAUNDERS, MD;  Location[facility] INVASIVE CV LAB;  Service: Cardiovascular;  Laterality: N/A;     reports that she has quit smoking. She has never used smokeless tobacco. She reports that she does not drink alcohol and does not use drugs.  Allergies  Allergen Reactions   Flecainide      Funny taste; hairloss   Sulfa Antibiotics Nausea Only    Family History  Problem Relation Age of Onset   Cancer Father    Cancer Other        BOTH SIDES OF THE FAMILY   Diabetes Other        MATERNAL SIDE OF THE FAMILY   Breast cancer Maternal Aunt     Prior to Admission medications   Medication Sig Start Date End Date Taking? Authorizing Provider  amoxicillin (AMOXIL) 500 MG capsule Take 2,000 mg by mouth See admin instructions. Take 4 capsules (2000 mg) by mouth 1 hour prior to dental appointments.    [provider]  apixaban  (ELIQUIS ) 5 MG TABS tablet Take 1 tablet (5 mg total) by mouth 2 (two) times daily. 12/01/21   Gollan, Timothy J, MD  Azelaic Acid  (FINACEA ) 15 % FOAM Apply BID to aa for rosacea. Patient not taking: Reported on 03/29/2024 12/27/23   Jackquline Sawyer, MD  clobetasol  cream (TEMOVATE ) 0.05 % Apply BID to aa. Apply to chin no more than a week at  a time. Avoid applying around eyes. Patient not taking: Reported on 03/29/2024 12/27/23   Jackquline Sawyer, MD  clonazePAM  (KLONOPIN ) 0.5 MG tablet Take 0.5 mg by mouth. 12/15/23 03/29/24  [provider]  Cyanocobalamin  (VITAMIN B-12 PO) Take by mouth.    [provider]  dofetilide  (TIKOSYN ) 250 MCG capsule Take 1 capsule (250 mcg total) by mouth 2 (two) times daily. 03/08/24   Loistine Sober, NP  ipratropium (ATROVENT) 0.03 % nasal spray Place 1 spray into both nostrils daily as needed (allergies/congestion.). 09/04/20 03/29/24  [provider]  meclizine (ANTIVERT) 25 MG tablet Take 25 mg by mouth 3 (three) times daily. 02/23/24   [provider]  potassium chloride  (KLOR-CON  M) 10 MEQ tablet Take 1 tablet (10 mEq total) by mouth daily. 06/09/24   Cindie Ole DASEN, MD  rosuvastatin  (CRESTOR ) 10 MG tablet Take 10 mg by mouth in the morning. 03/06/22   [provider]   I have personally, briefly reviewed patient's prior medical records in [place] [facility]  Objective: Blood pressure (!) 148/90, pulse 83, temperature 97.8 F (  36.6 C), temperature source Oral, resp. rate 17, height 5' 8 (1.727 m), weight 60.8 kg, SpO2 100%.   Constitutional: NAD, calm, comfortable HEENT: lids and conjunctivae normal. MMM. Posterior pharynx clear of any exudate or lesions. Normal dentition.  Neck: normal, supple, no masses, no thyromegaly Respiratory: CTAB, no wheezing, no crackles. Normal respiratory effort. No accessory muscle use.  Cardiovascular: RRR, no murmurs / rubs / gallops. No extremity edema. 2+ pedal pulses. no clubbing / cyanosis.  Abdomen: soft, NT, ND, no masses or HSM palpated. Musculoskeletal: No joint deformity upper and lower extremities. Normal muscle tone.  Skin: dry, intact, normal color, normal temperature on exposed skin Neurologic: Alert and oriented x 3. Normal speech. Grossly non-focal exam. PERRL Psychiatric: Normal mood. Congruent  affect.  Labs on Admission: I have personally reviewed admission labs and imaging studies  CBC    Component Value Date/Time   WBC 5.6 06/19/2024 0956   RBC 5.41 (H) 06/19/2024 0956   HGB 15.6 (H) 06/19/2024 0956   HGB 15.7 05/12/2023 0930   HCT 47.8 (H) 06/19/2024 0956   HCT 49.9 (H) 05/12/2023 0930   PLT 170 06/19/2024 0956   PLT 209 05/12/2023 0930   MCV 88.4 06/19/2024 0956   MCV 93 05/12/2023 0930   MCV 86 12/11/2013 1524   MCH 28.8 06/19/2024 0956   MCHC 32.6 06/19/2024 0956   RDW 13.2 06/19/2024 0956   RDW 13.1 05/12/2023 0930   RDW 14.1 12/11/2013 1524   LYMPHSABS 1.4 06/19/2024 0956   LYMPHSABS 2.0 12/11/2013 1524   MONOABS 0.5 06/19/2024 0956   MONOABS 0.5 12/11/2013 1524   EOSABS 0.2 06/19/2024 0956   EOSABS 0.2 12/11/2013 1524   BASOSABS 0.1 06/19/2024 0956   BASOSABS 0.1 12/11/2013 1524   CMP     Component Value Date/Time   NA 140 06/19/2024 0956   NA 140 03/08/2024 1033   NA 141 11/23/2013 1245   K 4.1 06/19/2024 0956   K 3.8 11/23/2013 1245   CL 103 06/19/2024 0956   CL 105 11/23/2013 1245   CO2 27 06/19/2024 0956   CO2 31 11/23/2013 1245   GLUCOSE 118 (H) 06/19/2024 0956   GLUCOSE 94 11/23/2013 1245   BUN 16 06/19/2024 0956   BUN 14 03/08/2024 1033   BUN 11 11/23/2013 1245   CREATININE 0.78 06/19/2024 0956   CREATININE 0.57 (L) 11/23/2013 1245   CALCIUM  9.6 06/19/2024 0956   CALCIUM  9.0 11/23/2013 1245   PROT 5.4 (L) 11/15/2023 1246   PROT 6.7 03/16/2023 1128   ALBUMIN 3.0 (L) 11/15/2023 1246   ALBUMIN 4.4 03/16/2023 1128   AST 36 11/15/2023 1246   ALT 23 11/15/2023 1246   ALKPHOS 69 11/15/2023 1246   BILITOT 1.6 (H) 11/15/2023 1246   BILITOT 1.6 (H) 03/16/2023 1128   GFRNONAA >60 06/19/2024 0956   GFRNONAA >60 11/23/2013 1245   GFRAA >60 07/13/2016 0511   GFRAA >60 11/23/2013 1245    Radiological Exams on Admission: DG Chest Port 1 View Result Date: 06/19/2024 CLINICAL DATA:  Atrial fibrillation. EXAM: PORTABLE CHEST 1 VIEW  COMPARISON:  Chest radiograph dated 11/15/2023 FINDINGS: No focal consolidation, pleural effusion or pneumothorax. Stable cardiac silhouette. Median sternotomy wires and cardiac valve repair. No acute osseous pathology. IMPRESSION: No active disease. Electronically Signed   By: Vanetta Chou M.D.   On: 06/19/2024 11:16    EKG: Independently reviewed. Afib, HR 90  DVT prophylaxis:  apixaban  (ELIQUIS ) tablet 5 mg   Code Status: full  Family Communication: none  at bedside   Disposition Plan: admit to obs  Consults called: cardiology    Marien LITTIE Piety, DO [facility]  06/19/2024, 2:50 PM    To contact the appropriate [facility] Attending or Consulting provider: Check [email or web address] for coverage from 7pm-7am

## 2024-06-19 NOTE — ED Notes (Signed)
 CCMD called to admit pt to monitor- box not connecting per CCMD.

## 2024-06-19 NOTE — Consult Note (Signed)
 Cardiology Consultation   Patient ID: MAPLE ODANIEL MRN: [id number]; DOB: [birth year]  Admit date: 06/19/2024 Date of Consult: 06/19/2024  PCP:  Auston Reyes BIRCH, MD   [facility] Providers Cardiologist:  Evalene Lunger, MD  Electrophysiologist:  OLE ONEIDA HOLTS, MD       Patient Profile: Colleen Williamson is a 78 y.o. female with a hx of atrial fibrillation/flutter who is being seen 06/19/2024 for the evaluation of recurrent atrial fibrillation with RVR at the request of Dr. Lenon.  History of Present Illness: Colleen Williamson is a 78 year old female with known history of mitral valve replacement in 2016 complicated by stroke, essential hypertension, SVT, and persistent atrial fibrillation as well as atypical atrial flutter. She had multiple cardioversions in the past.  She underwent atrial fibrillation ablation in November 2024 by Dr. Holts.  She presented in April with narrow complex tachycardia thought to be SVT but likely atrial flutter.  She underwent TEE guided cardioversion.  She was was then started on dofetilide  and has maintained in sinus rhythm for the most part.  She presented today with palpitations with extreme dizziness and shortness of breath.  She called EMS.  She was found to be in narrow complex regular tachycardia at a rate of 170 bpm.  She was given 1 dose of adenosine  which did not convert her.  She was given 1 dose of diltiazem  10 mg with a drop in heart rate to the 80s.  She felt better after that but then continued to have exertional palpitations and tachycardia.  Her ventricular rate is more controlled but she seems to be still in atrial fibrillation.  Past Medical History:  Diagnosis Date   Abnormal mammogram, unspecified 2014   Anxiety    Atypical atrial flutter (HCC)    a. 12/2014 s/p RFCA.   History of cardiac cath    a. 03/2014 Cath [facility]): Nl cors.   Mitral valve disorder    a. 03/2014 s/p MV repair [facility]); b. 06/2016 Echo: EF 55-60%, no rwma,  Nl fxn'ing MV w/o stenosis. Mildly dil LA/RA.   PAF (paroxysmal atrial fibrillation) (HCC)    a. 06/2016 s/p DCCV; b.  CHA2DS2VASc = 4-->Eliquis . Rhythm mgmt w/ amio (prev intol to flecainide ).   PSVT (paroxysmal supraventricular tachycardia)    Stroke (HCC)    a. 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    Past Surgical History:  Procedure Laterality Date   ABDOMINAL HYSTERECTOMY  2004   ATRIAL FIBRILLATION ABLATION N/A 06/10/2023   Procedure: ATRIAL FIBRILLATION ABLATION;  Surgeon: Holts Ole ONEIDA, MD;  [facility] INVASIVE CV LAB;  Service: Cardiovascular;  Laterality: N/A;   BREAST CYST ASPIRATION  15 YRS AGO   CARDIOVERSION N/A 09/21/2014   Procedure: CARDIOVERSION;  Surgeon: Maude JAYSON Emmer, MD;  Location[facility] ENDOSCOPY;  Service: Cardiovascular;  Laterality: N/A;   CARDIOVERSION N/A 02/26/2023   Procedure: CARDIOVERSION;  Surgeon: Lunger Evalene PARAS, MD;  Location: [facility] ORS;  Service: Cardiovascular;  Laterality: N/A;   CARDIOVERSION N/A 11/16/2023   Procedure: CARDIOVERSION;  Surgeon: Shlomo Wilbert SAUNDERS, MD;  Location: [facility] INVASIVE CV LAB;  Service: Cardiovascular;  Laterality: N/A;   COLONOSCOPY WITH PROPOFOL  N/A 05/15/2020   Procedure: COLONOSCOPY WITH PROPOFOL ;  Surgeon: Toledo, Ladell MARLA, MD;  Location: [facility] ENDOSCOPY;  Service: Endoscopy;  Laterality: N/A;   ELECTROPHYSIOLOGIC STUDY N/A 11/28/2014   Procedure: Cardioversion;  Surgeon: Vinie DELENA Jude, MD;  Location: [facility] INVASIVE CV LAB;  Service: Cardiovascular;  Laterality: N/A;   ELECTROPHYSIOLOGIC  STUDY N/A 12/27/2014   Procedure: A-Flutter;  Surgeon: Danelle LELON Birmingham, MD;  Location: [facility] INVASIVE CV LAB;  Service: Cardiovascular;  Laterality: N/A;   ELECTROPHYSIOLOGIC STUDY N/A 11/28/2014   Procedure: Cardioversion;  Surgeon: Vinie DELENA Jude, MD;  Location: [facility] ORS;  Service: Cardiovascular;  Laterality: N/A;  Have discussed with Dr. Debby   ELECTROPHYSIOLOGIC STUDY N/A 07/13/2016   Procedure: Cardioversion;  Surgeon: Deatrice DELENA Cage, MD;  Location: [facility] ORS;  Service: Cardiovascular;  Laterality: N/A;   EYE SURGERY  2007   MITRAL VALVE REPAIR  05/2014   RADIOLOGY WITH ANESTHESIA N/A 07/03/2014   Procedure: RADIOLOGY WITH ANESTHESIA;  Surgeon: Thyra MARLA Nash, MD;  Location: [facility] OR;  Service: Radiology;  Laterality: N/A;   TRANSESOPHAGEAL ECHOCARDIOGRAM (CATH LAB) N/A 11/16/2023   Procedure: TRANSESOPHAGEAL ECHOCARDIOGRAM;  Surgeon: Shlomo Wilbert SAUNDERS, MD;  Location: [facility] INVASIVE CV LAB;  Service: Cardiovascular;  Laterality: N/A;     Home Medications:  Prior to Admission medications   Medication Sig Start Date End Date Taking? Authorizing Provider  apixaban  (ELIQUIS ) 5 MG TABS tablet Take 1 tablet (5 mg total) by mouth 2 (two) times daily. 12/01/21  Yes Gollan, Timothy J, MD  busPIRone  (BUSPAR ) 10 MG tablet Take 10 mg by mouth 2 (two) times daily. 06/05/24 06/05/25 Yes [provider]  dofetilide  (TIKOSYN ) 250 MCG capsule Take 1 capsule (250 mcg total) by mouth 2 (two) times daily. 03/08/24  Yes Wittenborn, Barnie, NP  potassium chloride  (KLOR-CON  M) 10 MEQ tablet Take 1 tablet (10 mEq total) by mouth daily. 06/09/24  Yes Cindie Ole DASEN, MD  propranolol  (INDERAL ) 10 MG tablet Take 10 mg by mouth 2 (two) times daily. 06/16/24 06/16/25 Yes [provider]  rosuvastatin  (CRESTOR ) 10 MG tablet Take 10 mg by mouth in the morning. 03/06/22  Yes [provider]  amoxicillin (AMOXIL) 500 MG capsule Take 2,000 mg by mouth See admin instructions. Take 4 capsules (2000 mg) by mouth 1 hour prior to dental appointments.    [provider]  Azelaic Acid  (FINACEA ) 15 % FOAM Apply BID to aa for rosacea. Patient not taking: Reported on 06/19/2024 12/27/23   Jackquline Sawyer, MD  clobetasol  cream (TEMOVATE ) 0.05 % Apply BID to aa. Apply to chin no more than a week at a time. Avoid applying around eyes. Patient not taking: Reported on 03/29/2024 12/27/23   Jackquline Sawyer, MD  clonazePAM  (KLONOPIN ) 0.5 MG tablet  Take 0.5 mg by mouth. 12/15/23 03/29/24  [provider]  Cyanocobalamin  (VITAMIN B-12 PO) Take by mouth. Patient not taking: Reported on 06/19/2024    [provider]  ipratropium (ATROVENT) 0.03 % nasal spray Place 1 spray into both nostrils daily as needed (allergies/congestion.). 09/04/20 03/29/24  [provider]  meclizine (ANTIVERT) 25 MG tablet Take 25 mg by mouth 3 (three) times daily. Patient not taking: Reported on 06/19/2024 02/23/24   [provider]  propranolol  ER (INDERAL  LA) 60 MG 24 hr capsule Take 60 mg by mouth daily. Patient not taking: Reported on 06/19/2024 06/05/24   [provider]  sertraline  (ZOLOFT ) 50 MG tablet Take 50 mg by mouth daily. Patient not taking: Reported on 06/19/2024 06/05/24   [provider]    Scheduled Meds:  apixaban   5 mg Oral BID   [START ON 06/20/2024] polyethylene glycol  17 g Oral Daily   propranolol   10 mg Oral BID   [START ON 06/20/2024] rosuvastatin   10 mg Oral Daily   Continuous Infusions:  diltiazem  (CARDIZEM )  infusion Stopped (06/19/24 1605)   PRN Meds: acetaminophen  **OR** acetaminophen , albuterol , bisacodyl , ondansetron  **OR** ondansetron  (ZOFRAN ) IV  Allergies:    Allergies  Allergen Reactions   Flecainide      Funny taste; hairloss   Sulfa Antibiotics Nausea Only    Social History:   Social History   Socioeconomic History   Marital status: Widowed    Spouse name: Not on file   Number of children: 3   Years of education: 12+   Highest education level: Not on file  Occupational History   Not on file  Tobacco Use   Smoking status: Former   Smokeless tobacco: Never   Tobacco comments:    quit smoking in her 20's  Vaping Use   Vaping status: Former  Substance and Sexual Activity   Alcohol use: No    Alcohol/week: 0.0 standard drinks of alcohol   Drug use: No   Sexual activity: Not on file  Other Topics Concern   Not on file  Social History Narrative   Patient  is widowed with 3 children.   Patient is right handed.   Patient has hs education plus 1-2 yrs of college.   Patient drinks 1 cup daily.   Social Drivers of Corporate Investment Banker Strain: Low Risk  (09/22/2023)   Received from [facility]   Overall Financial Resource Strain (CARDIA)    Difficulty of Paying Living Expenses: Not hard at all  Food Insecurity: No Food Insecurity (11/16/2023)   Hunger Vital Sign    Worried About Running Out of Food in the Last Year: Never true    Ran Out of Food in the Last Year: Never true  Transportation Needs: No Transportation Needs (11/16/2023)   PRAPARE - Administrator, Civil Service (Medical): No    Lack of Transportation (Non-Medical): No  Physical Activity: Not on file  Stress: Not on file  Social Connections: Moderately Isolated (11/16/2023)   Social Connection and Isolation Panel    Frequency of Communication with Friends and Family: More than three times a week    Frequency of Social Gatherings with Friends and Family: More than three times a week    Attends Religious Services: More than 4 times per year    Active Member of [employer] or Organizations: No    Attends Banker Meetings: Never    Marital Status: Widowed  Intimate Partner Violence: Not At Risk (11/16/2023)   Humiliation, Afraid, Rape, and Kick questionnaire    Fear of Current or Ex-Partner: No    Emotionally Abused: No    Physically Abused: No    Sexually Abused: No    Family History:    Family History  Problem Relation Age of Onset   Cancer Father    Cancer Other        BOTH SIDES OF THE FAMILY   Diabetes Other        MATERNAL SIDE OF THE FAMILY   Breast cancer Maternal Aunt      ROS:  Please see the history of present illness.   All other ROS reviewed and negative.     Physical Exam/Data: Vitals:   06/19/24 1300 06/19/24 1430 06/19/24 1530 06/19/24 1640  BP: (!) 148/90 130/85 (!) 126/93   Pulse: 83 92 96   Resp: 17 19  17    Temp:    98 F (36.7 C)  TempSrc:      SpO2: 100% 100% 100%   Weight:  Height:       No intake or output data in the 24 hours ending 06/19/24 1807    06/19/2024    9:55 AM 03/29/2024    8:53 AM 03/08/2024    9:50 AM  Last 3 Weights  Weight (lbs) 134 lb 139 lb 139 lb 9.6 oz  Weight (kg) 60.782 kg 63.05 kg 63.322 kg     Body mass index is 20.37 kg/m.  General:  Well nourished, well developed, in no acute distress HEENT: normal Neck: no JVD Vascular: No carotid bruits; Distal pulses 2+ bilaterally Cardiac:  normal S1, S2; irregularly irregular; no murmur  Lungs:  clear to auscultation bilaterally, no wheezing, rhonchi or rales  Abd: soft, nontender, no hepatomegaly  Ext: no edema Musculoskeletal:  No deformities, BUE and BLE strength normal and equal Skin: warm and dry  Neuro:  CNs 2-12 intact, no focal abnormalities noted Psych:  Normal affect   EKG:  The EKG was personally reviewed and demonstrates: Initial EKG showed ectopic atrial rhythm which was after she was given diltiazem .  She is now in atrial fibrillation with prolonged QT interval. Telemetry:  Telemetry was personally reviewed and demonstrates:    Relevant CV Studies:   Laboratory Data: High Sensitivity Troponin:  No results for input(s): TROPONINIHS in the last 720 hours.   Chemistry Recent Labs  Lab 06/19/24 0956  NA 140  K 4.1  CL 103  CO2 27  GLUCOSE 118*  BUN 16  CREATININE 0.78  CALCIUM  9.6  MG 2.0  GFRNONAA >60  ANIONGAP 11    No results for input(s): PROT, ALBUMIN, AST, ALT, ALKPHOS, BILITOT in the last 168 hours. Lipids No results for input(s): CHOL, TRIG, HDL, LABVLDL, LDLCALC, CHOLHDL in the last 168 hours.  Hematology Recent Labs  Lab 06/19/24 0956  WBC 5.6  RBC 5.41*  HGB 15.6*  HCT 47.8*  MCV 88.4  MCH 28.8  MCHC 32.6  RDW 13.2  PLT 170   Thyroid  No results for input(s): TSH, FREET4 in the last 168 hours.  BNP Recent Labs  Lab  06/19/24 0956  PROBNP 975.0*    DDimer No results for input(s): DDIMER in the last 168 hours.  Radiology/Studies:  DG Chest Port 1 View Result Date: 06/19/2024 CLINICAL DATA:  Atrial fibrillation. EXAM: PORTABLE CHEST 1 VIEW COMPARISON:  Chest radiograph dated 11/15/2023 FINDINGS: No focal consolidation, pleural effusion or pneumothorax. Stable cardiac silhouette. Median sternotomy wires and cardiac valve repair. No acute osseous pathology. IMPRESSION: No active disease. Electronically Signed   By: Vanetta Chou M.D.   On: 06/19/2024 11:16     Assessment and Plan: Persistent atrial fibrillation/flutter with RVR: She is highly symptomatic.  This is in spite of Tikosyn  likely with inability to increase the dose given prolonged QT interval.  She was recently started on propranolol .  Will switch to metoprolol  for better rate control and ask EP to see her tomorrow.  Continue anticoagulation with Eliquis  5 mg twice daily.  If ventricular rate increases significantly, recommend diltiazem  drip. Status post mitral valve replacement: TEE in April showed mild mitral regurgitation.  EF was 40 to 45% in the setting of atrial arrhythmia.  Will request a transthoracic echo.    For questions or updates, please contact [facility] Please consult [email or web address] for contact info under      Signed, Deatrice Cage, MD  06/19/2024 6:07 PM\

## 2024-06-19 NOTE — ED Provider Notes (Signed)
 [facility] Provider Note    Event Date/Time   First MD Initiated Contact with Patient 06/19/24 [phone]     (approximate)   History   Dizziness   HPI  Colleen Williamson is a 78 y.o. female with a history of paroxysmal A-fib, SVT, hypertension, anxiety, and depression who presents with a rapid heart rate.  The patient states that around 8 AM she started to feel very faint and lightheaded.  She had palpitations at that time as well.  She denies any chest pain.  She states she has some shortness of breath but this is chronic.  She denies any acute leg swelling.  Per EMS, the patient was found to be in a regular heart rhythm with a rate around 170, thought to be SVT.  She received a dose of adenosine  with a drop in her rate to the 140s, and then a dose of Cardizem , with a drop in her heart rate to the 80s.  The patient states she is now feeling significantly better.  Reviewed the past medical records.  The patient's most recent outpatient encounter was on 11/21 with Dr. Auston for internal medicine for follow-up.  At that time her Zoloft  was discontinued and she was started on Inderal  and trazodone.  She is also on Eliquis  and Tikosyn .   Physical Exam   Triage Vital Signs: ED Triage Vitals  Encounter Vitals Group     BP 06/19/24 0954 (!) 133/95     Girls Systolic BP Percentile --      Girls Diastolic BP Percentile --      Boys Systolic BP Percentile --      Boys Diastolic BP Percentile --      Pulse Rate 06/19/24 0954 84     Resp 06/19/24 0954 17     Temp 06/19/24 0954 97.8 F (36.6 C)     Temp Source 06/19/24 0954 Oral     SpO2 06/19/24 0954 100 %     Weight 06/19/24 0955 134 lb (60.8 kg)     Height 06/19/24 0955 5' 8 (1.727 m)     Head Circumference --      Peak Flow --      Pain Score 06/19/24 0955 0     Pain Loc --      Pain Education --      Exclude from Growth Chart --     Most recent vital signs: Vitals:   06/19/24 1230 06/19/24 1300  BP: (!)  129/93 (!) 148/90  Pulse: 90 83  Resp: 15 17  Temp:    SpO2: 100% 100%     General: Awake, no distress.  CV:  Good peripheral perfusion.  Normal heart sounds. Resp:  Normal effort.  Lungs CTAB. Abd:  No distention.  Other:  No peripheral edema.   ED Results / Procedures / Treatments   Labs (all labs ordered are listed, but only abnormal results are displayed) Labs Reviewed  PRO BRAIN NATRIURETIC PEPTIDE - Abnormal; Notable for the following components:      Result Value   Pro Brain Natriuretic Peptide 975.0 (*)    All other components within normal limits  CBC WITH DIFFERENTIAL/PLATELET - Abnormal; Notable for the following components:   RBC 5.41 (*)    Hemoglobin 15.6 (*)    HCT 47.8 (*)    All other components within normal limits  BASIC METABOLIC PANEL WITH GFR - Abnormal; Notable for the following components:   Glucose, Bld 118 (*)  All other components within normal limits  TROPONIN T, HIGH SENSITIVITY - Abnormal; Notable for the following components:   Troponin T High Sensitivity 25 (*)    All other components within normal limits  MAGNESIUM      EKG  ED ECG REPORT I, Waylon Cassis, the attending physician, personally viewed and interpreted this ECG.  Date: 06/19/2024 EKG Time: 0954 Rate: 84 Rhythm: normal sinus rhythm QRS Axis: normal Intervals: LAFB ST/T Wave abnormalities: normal Narrative Interpretation: no evidence of acute ischemia    RADIOLOGY  Chest x-ray: I independently viewed and interpreted the images; there is no focal consolidation or edema  PROCEDURES:  Critical Care performed: Yes, see critical care procedure note(s)  .Critical Care  Performed by: Cassis Waylon, MD Authorized by: Cassis Waylon, MD   Critical care provider statement:    Critical care time (minutes):  30   Critical care time was exclusive of:  Separately billable procedures and treating other patients   Critical care was necessary to treat or  prevent imminent or life-threatening deterioration of the following conditions:  Cardiac failure   Critical care was time spent personally by me on the following activities:  Development of treatment plan with patient or surrogate, discussions with consultants, evaluation of patient's response to treatment, examination of patient, ordering and review of laboratory studies, ordering and review of radiographic studies, ordering and performing treatments and interventions, pulse oximetry, re-evaluation of patient's condition, review of old charts and obtaining history from patient or surrogate   Care discussed with: admitting provider      MEDICATIONS ORDERED IN ED: Medications  diltiazem  (CARDIZEM ) 125 mg in dextrose  5% 125 mL (1 mg/mL) infusion (has no administration in time range)  rosuvastatin  (CRESTOR ) tablet 10 mg (has no administration in time range)  apixaban  (ELIQUIS ) tablet 5 mg (has no administration in time range)  clonazePAM  (KLONOPIN ) tablet 0.5 mg (has no administration in time range)  acetaminophen  (TYLENOL ) tablet 650 mg (has no administration in time range)    Or  acetaminophen  (TYLENOL ) suppository 650 mg (has no administration in time range)  polyethylene glycol (MIRALAX  / GLYCOLAX ) packet 17 g (has no administration in time range)  bisacodyl  (DULCOLAX) EC tablet 5 mg (has no administration in time range)  ondansetron  (ZOFRAN ) tablet 4 mg (has no administration in time range)    Or  ondansetron  (ZOFRAN ) injection 4 mg (has no administration in time range)  albuterol  (PROVENTIL ) (2.5 MG/3ML) 0.083% nebulizer solution 2.5 mg (has no administration in time range)  diltiazem  (CARDIZEM ) injection 10 mg (10 mg Intravenous Given 06/19/24 1333)     IMPRESSION / MDM / ASSESSMENT AND PLAN / ED COURSE  I reviewed the triage vital signs and the nursing notes.  78 year old female with PMH as noted above presents with an episode of lightheadedness/near syncope and palpitations, found  to be in SVT with a rate in the 170s by EMS.  She is now in sinus rhythm after receiving adenosine  and Cardizem .  Her vital signs are normal.  Physical exam is unremarkable for acute findings.  Differential diagnosis includes, but is not limited to, SVT, paroxysmal atrial fibrillation, other tachydysrhythmia.  We will obtain lab workup, chest x-ray, observe the patient on the monitor, and reassess.  Patient's presentation is most consistent with acute presentation with potential threat to life or bodily function.  The patient is on the cardiac monitor to evaluate for evidence of arrhythmia and/or significant heart rate changes.  ----------------------------------------- 3:09 PM on 06/19/2024 -----------------------------------------  Chest x-ray is  clear.  Lab workup is overall reassuring.  BMP and CBC showed no acute findings.  Troponin is minimally elevated.  BNP is also minimally elevated.  The patient remained in a sinus or ectopic atrial rhythm in the 80s and 90s for the several hours.  We were planning for likely discharge.  However, when the patient got up to use the bathroom, she started having palpitations and lightheadedness again and was found to have a heart rate in the 120s, once again in atrial fibrillation/flutter.  I consulted Dr. Darron from cardiology to help determine further management.  He recommended giving an additional dose of IV Cardizem  to see if the patient would convert back to a sinus rhythm.  If she did not convert he recommended starting her on a Cardizem  infusion and admitting for further management.  The patient's rate improved with the IV Cardizem  but she remains in atrial fibrillation/flutter.  Therefore I have ordered an infusion.  I consulted Dr. Lenon from the hospitalist service; based on our discussion she agrees to evaluate the patient for admission.   FINAL CLINICAL IMPRESSION(S) / ED DIAGNOSES   Final diagnoses:  Paroxysmal atrial fibrillation (HCC)      Rx / DC Orders   ED Discharge Orders     None        Note:  This document was prepared using Dragon voice recognition software and may include unintentional dictation errors.    Jacolyn Pae, MD 06/19/24 [phone]

## 2024-06-19 NOTE — ED Notes (Signed)
Patient ambulatory to bathroom with daughter.

## 2024-06-19 NOTE — ED Triage Notes (Signed)
 PT arrived via EMS emergency traffic. Pt felt dizzy this AM around 0800 and then called 911. Per EMS pt had a HR in the 170's and EMS gave 6mg  of adenosine  and then 2mg  of diltiazem  due to the rate not getting any better. Pt has a Hx of A fib and SVT. Pt is sitting comfortably on the ED stretcher at this time and is A/Ox4. VS WNL.

## 2024-06-20 ENCOUNTER — Observation Stay (HOSPITAL_BASED_OUTPATIENT_CLINIC_OR_DEPARTMENT_OTHER)
Admit: 2024-06-20 | Discharge: 2024-06-20 | Disposition: A | Discharge status: Home / Self Care | Attending: Cardiovascular Disease | Admitting: Cardiovascular Disease

## 2024-06-20 DIAGNOSIS — I4891 Unspecified atrial fibrillation: Secondary | ICD-10-CM

## 2024-06-20 DIAGNOSIS — I480 Paroxysmal atrial fibrillation: Secondary | ICD-10-CM | POA: Diagnosis not present

## 2024-06-20 DIAGNOSIS — F419 Anxiety disorder, unspecified: Secondary | ICD-10-CM

## 2024-06-20 DIAGNOSIS — Z8673 Personal history of transient ischemic attack (TIA), and cerebral infarction without residual deficits: Secondary | ICD-10-CM

## 2024-06-20 LAB — ECHOCARDIOGRAM COMPLETE
AR max vel: 2.49 cm2
AV Area VTI: 2.76 cm2
AV Area mean vel: 2.37 cm2
AV Mean grad: 1 mmHg
AV Peak grad: 2.2 mmHg
Ao pk vel: 0.74 m/s
Area-P 1/2: 5.97 cm2
Calc EF: 41.8 %
Height: 68 in
MV VTI: 1.52 cm2
P 1/2 time: 293 ms
S' Lateral: 2.7 cm
Single Plane A2C EF: 41.1 %
Single Plane A4C EF: 44.8 %
Weight: 2144 [oz_av]

## 2024-06-20 LAB — D-DIMER, QUANTITATIVE: D-Dimer, Quant: <0.27 ug{FEU}/mL (ref 0.00–0.50)

## 2024-06-20 MED ORDER — METOPROLOL TARTRATE 25 MG PO TABS
25.0000 mg | ORAL_TABLET | Freq: Once | ORAL | Status: AC
  Administered 2024-06-20: 25 mg via ORAL
  Filled 2024-06-20: qty 1

## 2024-06-20 MED ORDER — POTASSIUM CHLORIDE CRYS ER 10 MEQ PO TBCR
10.0000 meq | EXTENDED_RELEASE_TABLET | Freq: Every day | ORAL | Status: DC
  Administered 2024-06-20 – 2024-06-21 (×2): 10 meq via ORAL
  Filled 2024-06-20 (×2): qty 1

## 2024-06-20 MED ORDER — METOPROLOL TARTRATE 50 MG PO TABS
50.0000 mg | ORAL_TABLET | Freq: Two times a day (BID) | ORAL | Status: DC
  Administered 2024-06-20 – 2024-06-21 (×2): 50 mg via ORAL
  Filled 2024-06-20 (×2): qty 1

## 2024-06-20 MED ORDER — ORAL CARE MOUTH RINSE: 15.0000 mL | OROMUCOSAL | Status: DC | PRN

## 2024-06-20 MED ORDER — MELATONIN 5 MG PO TABS
5.0000 mg | ORAL_TABLET | Freq: Every evening | ORAL | Status: DC | PRN
Start: 2024-06-20 — End: 2024-06-21
  Administered 2024-06-20: 5 mg via ORAL
  Filled 2024-06-20: qty 1

## 2024-06-20 NOTE — Consult Note (Addendum)
 ELECTROPHYSIOLOGY CONSULT NOTE    Patient ID: Colleen Williamson MRN: [id number], DOB/AGE: 1946/03/23 78 y.o.  Admit date: 06/19/2024 Date of Consult: 06/20/2024  Primary Physician: Auston Reyes BIRCH, MD Primary Cardiologist: Evalene Lunger, MD  Electrophysiologist: Dr. Cindie   Referring Provider: @ATTENDING @  Patient Profile: Colleen Williamson is a 78 y.o. 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 Josette.  HPI:  Colleen Williamson is a 78 y.o. female with PMH as above who is well-known to EP team.   She is s/p Aflutter ablation 2016 by Dr. Waddell.  She is status post A-fib ablation with isolation of pulmonary veins, posterior wall, perimitral atypical flutter ablation 05/2023 by Dr. Cindie. She had recurrence of aflutter and SVT, and was loaded on tikosyn  10/2023. She last saw Dr. Cindie in clinic 03/2024 where she was doing well with stable QTC on tikosyn .   In the interim, she has had increased anxiety and saw PCP who added zoloft  to klonopin , ineffective so dose of zoloft  increased to 75mg  daily.    Yesterday, she woke up in her usual state of health and began her morning routine when she suddenly felt faint and dizzy.  She called her daughter who lives nearby who then called EMS.  Initial EKG with EMS appears atrial fibrillation and was given IV Cardizem  en route with improvement in ventricular rate.  Currently she denies chest pain, chest pressure, palpitations, dizziness or lightheadedness.  She is having reduced p.o. intake, states that food tastes like cardboard and has had weight loss with this.  She thinks this started about 6 to 8 months ago.  Her daughters are at bedside, and add that patient appears to be having frequent palpitation episodes some of which she does not relate to them or to her healthcare providers.  She was previously on  amiodarone , stopped due to fears of off target effects.  She has also tried flecainide  stopped due to metallic taste in mouth.   Labs Potassium4.1 (11/24 9043) Magnesium   2.0 (11/24 9043) Creatinine, ser  0.78 (11/24 0956) PLT  170 (11/24 0956) HGB  15.6* (11/24 0956) WBC 5.6 (11/24 0956)  .    Past Medical History:  Diagnosis Date   Abnormal mammogram, unspecified 2014   Anxiety    Atypical atrial flutter (HCC)    a. 12/2014 s/p RFCA.   History of cardiac cath    a. 03/2014 Cath [facility]): Nl cors.   Mitral valve disorder    a. 03/2014 s/p MV repair [facility]); b. 06/2016 Echo: EF 55-60%, no rwma, Nl fxn'ing MV w/o stenosis. Mildly dil LA/RA.   PAF (paroxysmal atrial fibrillation) (HCC)    a. 06/2016 s/p DCCV; b.  CHA2DS2VASc = 4-->Eliquis . Rhythm mgmt w/ amio (prev intol to flecainide ).   PSVT (paroxysmal supraventricular tachycardia)    Stroke (HCC)    a. 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     Surgical History:  Past Surgical History:  Procedure Laterality Date   ABDOMINAL HYSTERECTOMY  2004   ATRIAL FIBRILLATION ABLATION N/A 06/10/2023   Procedure: ATRIAL FIBRILLATION ABLATION;  Surgeon: Cindie Ole DASEN, MD;  Location: [facility] INVASIVE CV LAB;  Service: Cardiovascular;  Laterality: N/A;   BREAST CYST ASPIRATION  15 YRS AGO   CARDIOVERSION N/A 09/21/2014   Procedure: CARDIOVERSION;  Surgeon: Maude JAYSON Emmer, MD;  Location: [facility] ENDOSCOPY;  Service:  Cardiovascular;  Laterality: N/A;   CARDIOVERSION N/A 02/26/2023   Procedure: CARDIOVERSION;  Surgeon: Perla Evalene PARAS, MD;  Location: [facility] ORS;  Service: Cardiovascular;  Laterality: N/A;   CARDIOVERSION N/A 11/16/2023   Procedure: CARDIOVERSION;  Surgeon: Shlomo Wilbert SAUNDERS, MD;  Location: [facility] INVASIVE CV LAB;  Service: Cardiovascular;  Laterality: N/A;   COLONOSCOPY WITH PROPOFOL  N/A 05/15/2020   Procedure: COLONOSCOPY WITH PROPOFOL ;  Surgeon: Toledo, Ladell POUR, MD;  Location: [facility] ENDOSCOPY;  Service: Endoscopy;  Laterality: N/A;    ELECTROPHYSIOLOGIC STUDY N/A 11/28/2014   Procedure: Cardioversion;  Surgeon: Vinie DELENA Jude, MD;  Location: [facility] INVASIVE CV LAB;  Service: Cardiovascular;  Laterality: N/A;   ELECTROPHYSIOLOGIC STUDY N/A 12/27/2014   Procedure: A-Flutter;  Surgeon: Danelle LELON Birmingham, MD;  Location: [facility] INVASIVE CV LAB;  Service: Cardiovascular;  Laterality: N/A;   ELECTROPHYSIOLOGIC STUDY N/A 11/28/2014   Procedure: Cardioversion;  Surgeon: Vinie DELENA Jude, MD;  Location: [facility] ORS;  Service: Cardiovascular;  Laterality: N/A;  Have discussed with Dr. Debby   ELECTROPHYSIOLOGIC STUDY N/A 07/13/2016   Procedure: Cardioversion;  Surgeon: Deatrice DELENA Cage, MD;  Location: [facility] ORS;  Service: Cardiovascular;  Laterality: N/A;   EYE SURGERY  2007   MITRAL VALVE REPAIR  05/2014   RADIOLOGY WITH ANESTHESIA N/A 07/03/2014   Procedure: RADIOLOGY WITH ANESTHESIA;  Surgeon: Thyra POUR Nash, MD;  Location: [facility] OR;  Service: Radiology;  Laterality: N/A;   TRANSESOPHAGEAL ECHOCARDIOGRAM (CATH LAB) N/A 11/16/2023   Procedure: TRANSESOPHAGEAL ECHOCARDIOGRAM;  Surgeon: Shlomo Wilbert SAUNDERS, MD;  Location[facility] INVASIVE CV LAB;  Service: Cardiovascular;  Laterality: N/A;     Medications Prior to Admission  Medication Sig Dispense Refill Last Dose/Taking   apixaban  (ELIQUIS ) 5 MG TABS tablet Take 1 tablet (5 mg total) by mouth 2 (two) times daily. 180 tablet 1 06/19/2024 at  8:00 AM   busPIRone  (BUSPAR ) 10 MG tablet Take 10 mg by mouth 2 (two) times daily.   06/18/2024 Evening   dofetilide  (TIKOSYN ) 250 MCG capsule Take 1 capsule (250 mcg total) by mouth 2 (two) times daily. 180 capsule 3 06/19/2024 at  8:00 AM   potassium chloride  (KLOR-CON  M) 10 MEQ tablet Take 1 tablet (10 mEq total) by mouth daily. 30 tablet 3 06/19/2024 Morning   propranolol  (INDERAL ) 10 MG tablet Take 10 mg by mouth 2 (two) times daily.   06/18/2024 Bedtime   rosuvastatin  (CRESTOR ) 10 MG tablet Take 10 mg by mouth in the morning.   06/19/2024 Morning   amoxicillin  (AMOXIL) 500 MG capsule Take 2,000 mg by mouth See admin instructions. Take 4 capsules (2000 mg) by mouth 1 hour prior to dental appointments.      Azelaic Acid  (FINACEA ) 15 % FOAM Apply BID to aa for rosacea. (Patient not taking: Reported on 06/19/2024) 50 g 3 Not Taking   clobetasol  cream (TEMOVATE ) 0.05 % Apply BID to aa. Apply to chin no more than a week at a time. Avoid applying around eyes. (Patient not taking: Reported on 03/29/2024) 30 g 1 Not Taking   clonazePAM  (KLONOPIN ) 0.5 MG tablet Take 0.5 mg by mouth.      Cyanocobalamin  (VITAMIN B-12 PO) Take by mouth. (Patient not taking: Reported on 06/19/2024)   Not Taking   ipratropium (ATROVENT) 0.03 % nasal spray Place 1 spray into both nostrils daily as needed (allergies/congestion.).      meclizine (ANTIVERT) 25 MG tablet Take 25 mg by mouth 3 (three) times daily. (Patient not taking: Reported on 06/19/2024)   Not Taking  propranolol  ER (INDERAL  LA) 60 MG 24 hr capsule Take 60 mg by mouth daily. (Patient not taking: Reported on 06/19/2024)   Not Taking   sertraline  (ZOLOFT ) 50 MG tablet Take 50 mg by mouth daily. (Patient not taking: Reported on 06/19/2024)   Not Taking    Inpatient Medications:   apixaban   5 mg Oral BID   busPIRone   10 mg Oral BID   dofetilide   250 mcg Oral BID   metoprolol  tartrate  25 mg Oral BID   polyethylene glycol  17 g Oral Daily   rosuvastatin   10 mg Oral Daily    Allergies:  Allergies  Allergen Reactions   Flecainide      Funny taste; hairloss   Sulfa Antibiotics Nausea Only    Family History  Problem Relation Age of Onset   Cancer Father    Cancer Other        BOTH SIDES OF THE FAMILY   Diabetes Other        MATERNAL SIDE OF THE FAMILY   Breast cancer Maternal Aunt      Physical Exam: Vitals:   06/19/24 2324 06/20/24 0332 06/20/24 0744 06/20/24 0745  BP: 111/84 106/69 (!) 131/101 (!) 139/97  Pulse: 87 94 99   Resp: 16 17 16    Temp: 97.7 F (36.5 C) 98.1 F (36.7 C) 97.6 F (36.4 C)    TempSrc:   Oral   SpO2: 98% 98% 99%   Weight:      Height:        GEN- NAD, A&O x 3, normal affect HEENT: Normocephalic, atraumatic Lungs- CTAB, Normal effort.  Heart- Regular rate and rhythm, No M/G/R.  GI- Soft, NT, ND.  Extremities- No clubbing, cyanosis, or edema   Radiology/Studies: DG Chest Port 1 View Result Date: 06/19/2024 CLINICAL DATA:  Atrial fibrillation. EXAM: PORTABLE CHEST 1 VIEW COMPARISON:  Chest radiograph dated 11/15/2023 FINDINGS: No focal consolidation, pleural effusion or pneumothorax. Stable cardiac silhouette. Median sternotomy wires and cardiac valve repair. No acute osseous pathology. IMPRESSION: No active disease. Electronically Signed   By: Vanetta Chou M.D.   On: 06/19/2024 11:16    EKG:   06/20/2024 at 8:32 AM- 2:1 atrial flutter, rate 94; QTC 465 06/19/2024 at 1358 -appears 2:1 atrial flutter, rate 91; QTC 490 by my calculation  (personally reviewed)    TELEMETRY: Appears atach vs aflutter with rates in 80-90s (personally reviewed)  DEVICE HISTORY: none  Assessment/Plan: #) Aflutter #) AFib #) SVT #) tikosyn  monitoring S/p Aflutter ablation 2016; s/p afib, aflutter ablation 2024 Had recurrence of SVT in 10/2023 and was loaded on tikosyn  with stable QTC since Woke up yesterday morning with acute sensation of palpitations, dizziness, lightheadedness.  She called EMS with initial EKG concerning for atrial fibrillation. Since admission she no longer has palpitations, dizziness, lightheadedness.  Initial EKG concerning for prolonged QTC.  Updated EKG with stable QTc at approximately 460 ms Long discussion with patient regarding whether to continue Tikosyn  and adjust beta-blocker versus stop Tikosyn  and reinitiate amiodarone . Patient preferred to continue Tikosyn  at this time Increase BB to 50mg  lopressor  BID Update TTE, previously ordered Continue Eliquis  twice daily for stroke ppx Keep K > 4, Mag > 2         For questions or  updates, please contact [facility] Please consult [email or web address] for contact info under Cardiology/STEMI.  Signed, Suzann Riddle, NP  06/20/2024 7:51 AM   I have seen the patient via virtual consultation today.  The patient expressed their consent  to participate in today's virtual consult. The patient was located at [facility] while I was located at [facility].  I have examined the patient and reviewed the above assessment and plan.     -----------------    Colleen Williamson was seen by me today along with Colleen Williamson. I have personally performed an evaluation on this patient.  My findings are as follows: 78 y.o. female patient with past medical history as above.  She presented to the hospital yesterday after an episode of significant palpitations.  She was in her usual state of health to feel faint and dizzy.  Her daughter who lives nearby called EMS.  Her initial EKG showed atrial fibrillation, though she has had multiple episodes of atrial flutter.  She is in atrial flutter currently.  She is on dofetilide .  Her main issue is anxiety currently.  Her atrial flutter is a well rate controlled..  Data: EKG(s) and pertinent labs, studies, etc were personally reviewed and interpreted by me:  EKG, lab Otherwise, I agree with data as outlined by the advanced practice provider.  Exam performed by me: Gen: No acute distress Neck: No JVD Lungs: Normal work of breathing Psych: Normal affect  My Assessment and Plan: 1.  Atypical atrial flutter 2.  Persistent atrial fibrillation. 3.  High risk medication monitoring  Patient had atrial fibrillation and flutter ablation in 2016 and 03/16/2023.  She has had recurrence of her arrhythmia.  She is on dofetilide .  She has had recurrence of her atrial flutter.  Layken Doenges continue her dofetilide .  Would increase her Toprol  to 50 mg twice daily.  Echo is pending.  If she is feeling well, can be discharged from the  hospital with follow-up in clinic to discuss cardioversion.  If she starts feeling poorly with ambulation, cardioverted as an inpatient.  Signed,  Yarrow Linhart Gladis Norton, MD  06/20/2024 1:30 PM    Caregility was used to complete today's consult.     Total time of encounter: 40 minutes total time of encounter, including face-to-face patient care, coordination of care and counseling regarding high complexity medical decision making re: atrial fibrillation/flutter.   H9574 - ER or Initial Inpatient 30 min H9573 - ER or Initial Inpatient 50 min G0427 - ER or Initial Inpatient 70 min  G0406 - Follow up inpatient consult limited 15 min G0407 - Follow up inpatient consult intermediate 25 min G0408 - Follow up inpatient consult complex 35 min  -GT modifier - via interactive audio and video telecommunication systems     Nolton Denis Gladis Norton, MD 06/20/2024 1:29 PM

## 2024-06-20 NOTE — Assessment & Plan Note (Signed)
 This morning in normal sinus rhythm.  Seen by EP team and Tikosyn  will be continued and metoprolol  increased to 50 mg twice a day.  Patient is symptomatic when she is not in normal sinus rhythm.  Continue Eliquis  for anticoagulation.

## 2024-06-20 NOTE — Care Management Obs Status (Signed)
 MEDICARE OBSERVATION STATUS NOTIFICATION   Patient Details  Name: [name] MRN: [id number] Date of Birth: 10-16-1945   Medicare Observation Status Notification Given:  Yes    Rojelio SHAUNNA Rattler 06/20/2024, 11:08 AM

## 2024-06-20 NOTE — Plan of Care (Signed)

## 2024-06-20 NOTE — Assessment & Plan Note (Signed)
On BuSpar

## 2024-06-20 NOTE — Progress Notes (Signed)
 Progress Note   Patient: Colleen Williamson FMW:[id number] DOB: [birth year] DOA: 06/19/2024     0 DOS: the patient was seen and examined on 06/20/2024   Brief hospital course: 78 y.o. female with a PMH significant for atrial flutter/fibrillation, CVA, HTN, anxiety, depression, MVP, HLD, lymphedema. At baseline, they live alone and are independent with ADLs.   They presented from home to the ED on 06/19/2024 with episode of dizziness and lightheadedness beginning this morning. She states that she was getting up from the table when she started to feel the sensation of lightheadedness and almost going to pass out. Endorses feeling racing heart. Has had similar sensations but not this strong. She takes eliquis  and tikosyn  for her afib and is very religious about taking them on time every day. She did take her morning doses but not with food as she normally does. She also had several anxiety medication changes recently including starting propranolol  10mg  BID and stopped sertraline . Was prescribed trazodone but decided to not take it for interference with her heart medications. She received adenosine  with EMS without significant change. She then received diltiazem  which controlled her rate and resolved her symptoms in ED. She remains in afib with rate around 90 and asymptomatic so is being admitted for observation.    Significant findings included: proBNP 975, WBC 5.6, hgb 15.6, unremarkable metabolic panel. Troponin 25. Mg++ normal  Chest xray: no active disease  11/25.  Patient feeling better today.  Currently in sinus rhythm.  Seen by EP team and recommended watching an overnight.  Metoprolol  increased to 50 mg twice a day.  Continue Tikosyn .  Assessment and Plan: * Paroxysmal atrial fibrillation with RVR (HCC) This morning in normal sinus rhythm.  Seen by EP team and Tikosyn  will be continued and metoprolol  increased to 50 mg twice a day.  Patient is symptomatic when she is not in normal sinus rhythm.   Continue Eliquis  for anticoagulation.  Anxiety On BuSpar   History of CVA (cerebrovascular accident) On Eliquis  and Crestor         Subjective: Patient coming in with lightheadedness dizziness and near passing out.  Felt her heart rate fast.  Admitted with rapid atrial fibrillation.  Feeling better this morning.  Currently in normal sinus rhythm.  Physical Exam: Vitals:   06/20/24 0332 06/20/24 0744 06/20/24 0745 06/20/24 1207  BP: 106/69 (!) 131/101 (!) 139/97 (!) 128/98  Pulse: 94 99  90  Resp: 17 16  15   Temp: 98.1 F (36.7 C) 97.6 F (36.4 C)  97.8 F (36.6 C)  TempSrc:  Oral  Oral  SpO2: 98% 99%  97%  Weight:      Height:       Physical Exam HENT:     Head: Normocephalic.  Eyes:     General: Lids are normal.     Conjunctiva/sclera: Conjunctivae normal.  Cardiovascular:     Rate and Rhythm: Normal rate and regular rhythm.     Heart sounds: Normal heart sounds, S1 normal and S2 normal.  Pulmonary:     Breath sounds: No decreased breath sounds, wheezing, rhonchi or rales.  Abdominal:     Palpations: Abdomen is soft.     Tenderness: There is no abdominal tenderness.  Musculoskeletal:     Right lower leg: No swelling.     Left lower leg: No swelling.  Skin:    General: Skin is warm.     Findings: No rash.  Neurological:     Mental Status: She is alert and  oriented to person, place, and time.     Data Reviewed: Echo pending Electrolytes normal range, glucose 118, creatinine 0.78, proBNP 975, troponin 25, white blood cell count 5.6, hemoglobin 15.6, platelet count 170  Family Communication: Daughter is at bedside  Disposition: Status is: Observation EP team wanted to watch another night in the hospital.  Metoprolol  increased.  Planned Discharge Destination: Home    Time spent: 28 minutes  Author: Charlie Patterson, MD 06/20/2024 1:03 PM  For on call review [email or web address].

## 2024-06-20 NOTE — Assessment & Plan Note (Signed)
 On Eliquis  and Crestor 

## 2024-06-20 NOTE — TOC CM/SW Note (Signed)
 Transition of Care [facility]) CM/SW Note    Transition of Care [facility]) - Inpatient Brief Assessment   Patient Details  Name: Colleen Williamson MRN: [id number] Date of Birth: 1945-12-13  [facility]) CM/SW Contact:    Alfonso Rummer, LCSW Phone Number: 06/20/2024, 4:00 PM   Clinical Narrative: LCSW A. Kileigh Ortmann completed TOC chart review. NO TOC needs identified. Please contact TOC should needs arise.    Transition of Care Asessment: Insurance and Status: Insurance coverage has been reviewed Patient has primary care physician: Yes (SPARKS, JEFFREY D) Home environment has been reviewed: single family home   Prior/Current Home Services: No current home services Social Drivers of Health Review: SDOH reviewed no interventions necessary Readmission risk has been reviewed: No Transition of care needs: no transition of care needs at this time

## 2024-06-20 NOTE — Hospital Course (Signed)
 78 y.o. female with a PMH significant for atrial flutter/fibrillation, CVA, HTN, anxiety, depression, MVP, HLD, lymphedema. At baseline, they live alone and are independent with ADLs.   They presented from home to the ED on 06/19/2024 with episode of dizziness and lightheadedness beginning this morning. She states that she was getting up from the table when she started to feel the sensation of lightheadedness and almost going to pass out. Endorses feeling racing heart. Has had similar sensations but not this strong. She takes eliquis  and tikosyn  for her afib and is very religious about taking them on time every day. She did take her morning doses but not with food as she normally does. She also had several anxiety medication changes recently including starting propranolol  10mg  BID and stopped sertraline . Was prescribed trazodone but decided to not take it for interference with her heart medications. She received adenosine  with EMS without significant change. She then received diltiazem  which controlled her rate and resolved her symptoms in ED. She remains in afib with rate around 90 and asymptomatic so is being admitted for observation.    Significant findings included: proBNP 975, WBC 5.6, hgb 15.6, unremarkable metabolic panel. Troponin 25. Mg++ normal  Chest xray: no active disease  11/25.  Patient feeling better today.  Currently in sinus rhythm.  Seen by EP team and recommended watching an overnight.  Metoprolol  increased to 50 mg twice a day.  Continue Tikosyn .

## 2024-06-20 NOTE — Plan of Care (Signed)

## 2024-06-21 ENCOUNTER — Observation Stay: Admitting: Anesthesiology

## 2024-06-21 ENCOUNTER — Encounter
Admission: EM | Disposition: A | Discharge status: Home / Self Care | Payer: Self-pay | Source: Home / Self Care | Attending: Internal Medicine

## 2024-06-21 DIAGNOSIS — R0602 Shortness of breath: Secondary | ICD-10-CM

## 2024-06-21 DIAGNOSIS — Z90710 Acquired absence of both cervix and uterus: Secondary | ICD-10-CM | POA: Diagnosis not present

## 2024-06-21 DIAGNOSIS — Z7901 Long term (current) use of anticoagulants: Secondary | ICD-10-CM | POA: Diagnosis not present

## 2024-06-21 DIAGNOSIS — E785 Hyperlipidemia, unspecified: Secondary | ICD-10-CM | POA: Diagnosis present

## 2024-06-21 DIAGNOSIS — F418 Other specified anxiety disorders: Secondary | ICD-10-CM | POA: Diagnosis not present

## 2024-06-21 DIAGNOSIS — I4892 Unspecified atrial flutter: Secondary | ICD-10-CM | POA: Diagnosis present

## 2024-06-21 DIAGNOSIS — Z8673 Personal history of transient ischemic attack (TIA), and cerebral infarction without residual deficits: Secondary | ICD-10-CM

## 2024-06-21 DIAGNOSIS — I10 Essential (primary) hypertension: Secondary | ICD-10-CM | POA: Diagnosis present

## 2024-06-21 DIAGNOSIS — I4819 Other persistent atrial fibrillation: Secondary | ICD-10-CM

## 2024-06-21 DIAGNOSIS — I480 Paroxysmal atrial fibrillation: Secondary | ICD-10-CM

## 2024-06-21 DIAGNOSIS — Z888 Allergy status to other drugs, medicaments and biological substances status: Secondary | ICD-10-CM | POA: Diagnosis not present

## 2024-06-21 DIAGNOSIS — G4700 Insomnia, unspecified: Secondary | ICD-10-CM | POA: Diagnosis present

## 2024-06-21 DIAGNOSIS — L719 Rosacea, unspecified: Secondary | ICD-10-CM | POA: Diagnosis present

## 2024-06-21 DIAGNOSIS — I484 Atypical atrial flutter: Secondary | ICD-10-CM | POA: Diagnosis present

## 2024-06-21 DIAGNOSIS — Z803 Family history of malignant neoplasm of breast: Secondary | ICD-10-CM | POA: Diagnosis not present

## 2024-06-21 DIAGNOSIS — Z833 Family history of diabetes mellitus: Secondary | ICD-10-CM | POA: Diagnosis not present

## 2024-06-21 DIAGNOSIS — R002 Palpitations: Secondary | ICD-10-CM | POA: Diagnosis present

## 2024-06-21 DIAGNOSIS — Z87891 Personal history of nicotine dependence: Secondary | ICD-10-CM | POA: Diagnosis not present

## 2024-06-21 DIAGNOSIS — I471 Supraventricular tachycardia, unspecified: Secondary | ICD-10-CM | POA: Diagnosis present

## 2024-06-21 DIAGNOSIS — Z79899 Other long term (current) drug therapy: Secondary | ICD-10-CM | POA: Diagnosis not present

## 2024-06-21 DIAGNOSIS — Z882 Allergy status to sulfonamides status: Secondary | ICD-10-CM | POA: Diagnosis not present

## 2024-06-21 DIAGNOSIS — F419 Anxiety disorder, unspecified: Secondary | ICD-10-CM | POA: Diagnosis present

## 2024-06-21 DIAGNOSIS — I483 Typical atrial flutter: Secondary | ICD-10-CM | POA: Diagnosis not present

## 2024-06-21 DIAGNOSIS — Z952 Presence of prosthetic heart valve: Secondary | ICD-10-CM | POA: Diagnosis not present

## 2024-06-21 HISTORY — PX: CARDIOVERSION: SHX1299

## 2024-06-21 LAB — CBC WITH DIFFERENTIAL/PLATELET
Abs Immature Granulocytes: 0.01 K/uL (ref 0.00–0.07)
Basophils Absolute: 0.1 K/uL (ref 0.0–0.1)
Basophils Relative: 2 %
Eosinophils Absolute: 0.2 K/uL (ref 0.0–0.5)
Eosinophils Relative: 3 %
HCT: 49.8 % — ABNORMAL HIGH (ref 36.0–46.0)
Hemoglobin: 15.9 g/dL — ABNORMAL HIGH (ref 12.0–15.0)
Immature Granulocytes: 0 %
Lymphocytes Relative: 24 %
Lymphs Abs: 1.3 K/uL (ref 0.7–4.0)
MCH: 27.9 pg (ref 26.0–34.0)
MCHC: 31.9 g/dL (ref 30.0–36.0)
MCV: 87.4 fL (ref 80.0–100.0)
Monocytes Absolute: 0.5 K/uL (ref 0.1–1.0)
Monocytes Relative: 10 %
Neutro Abs: 3.3 K/uL (ref 1.7–7.7)
Neutrophils Relative %: 61 %
Platelets: 182 K/uL (ref 150–400)
RBC: 5.7 MIL/uL — ABNORMAL HIGH (ref 3.87–5.11)
RDW: 13.2 % (ref 11.5–15.5)
WBC: 5.4 K/uL (ref 4.0–10.5)
nRBC: 0 % (ref 0.0–0.2)

## 2024-06-21 LAB — COMPREHENSIVE METABOLIC PANEL WITH GFR
ALT: 16 U/L (ref 0–44)
AST: 28 U/L (ref 15–41)
Albumin: 4.1 g/dL (ref 3.5–5.0)
Alkaline Phosphatase: 92 U/L (ref 38–126)
Anion gap: 9 (ref 5–15)
BUN: 22 mg/dL (ref 8–23)
CO2: 27 mmol/L (ref 22–32)
Calcium: 9.5 mg/dL (ref 8.9–10.3)
Chloride: 103 mmol/L (ref 98–111)
Creatinine, Ser: 0.72 mg/dL (ref 0.44–1.00)
GFR, Estimated: >60 mL/min (ref >60)
Glucose, Bld: 96 mg/dL (ref 70–99)
Potassium: 4.2 mmol/L (ref 3.5–5.1)
Sodium: 139 mmol/L (ref 135–145)
Total Bilirubin: 1.5 mg/dL — ABNORMAL HIGH (ref 0.0–1.2)
Total Protein: 6.8 g/dL (ref 6.5–8.1)

## 2024-06-21 SURGERY — CARDIOVERSION
Anesthesia: General

## 2024-06-21 MED ORDER — PROPOFOL 10 MG/ML IV BOLUS
INTRAVENOUS | Status: DC | PRN
  Administered 2024-06-21: 50 mg via INTRAVENOUS

## 2024-06-21 MED ORDER — EPHEDRINE SULFATE-NACL 50-0.9 MG/10ML-% IV SOSY
PREFILLED_SYRINGE | INTRAVENOUS | Status: DC | PRN
  Administered 2024-06-21: 10 mg via INTRAVENOUS

## 2024-06-21 MED ORDER — ONDANSETRON HCL 4 MG/2ML IJ SOLN: 4.0000 mg | Freq: Three times a day (TID) | INTRAMUSCULAR | Status: DC | PRN

## 2024-06-21 MED ORDER — SODIUM CHLORIDE 0.9 % IV SOLN: INTRAVENOUS | Status: DC

## 2024-06-21 MED ORDER — METOPROLOL TARTRATE 50 MG PO TABS: 50.0000 mg | ORAL_TABLET | Freq: Two times a day (BID) | ORAL | 0 refills | Status: DC

## 2024-06-21 NOTE — Plan of Care (Signed)
   Problem: Education: Goal: Knowledge of General Education information will improve Description: Including pain rating scale, medication(s)/side effects and non-pharmacologic comfort measures Outcome: Progressing   Problem: Clinical Measurements: Goal: Ability to maintain clinical measurements within normal limits will improve Outcome: Progressing Goal: Diagnostic test results will improve Outcome: Progressing

## 2024-06-21 NOTE — Progress Notes (Signed)
  Patient Name: Colleen Williamson Date of Encounter: 06/21/2024  Primary Cardiologist: Timothy Gollan, MD Electrophysiologist: OLE ONEIDA HOLTS, MD  Interval Summary   Colleen Williamson.  Having some nausea today.     Vital Signs    Vitals:   06/20/24 2021 06/20/24 2306 06/21/24 0432 06/21/24 0822  BP: 118/76 (!) 117/97 119/88 (!) 133/92  Pulse: 92 89 93 96  Resp: 18 18 18    Temp: 98 F (36.7 C) 97.7 F (36.5 C) 97.9 F (36.6 C) 97.9 F (36.6 C)  TempSrc:      SpO2: 97% 99% 96% 99%  Weight:      Height:        Intake/Output Summary (Last 24 hours) at 06/21/2024 0834 Last data filed at 06/21/2024 0400 Gross per 24 hour  Intake 1080 ml  Output --  Net 1080 ml   Filed Weights   06/19/24 0955  Weight: 60.8 kg    Physical Exam    GEN- NAD, Alert and oriented  Lungs- Clear to ausculation bilaterally, normal work of breathing Cardiac- Regular rate and rhythm, no murmurs, rubs or gallops GI- soft, NT, ND, + BS Extremities- no clubbing or cyanosis. No edema  Telemetry    2:1 Aflutter, rates 90s, rarely 130s (personally reviewed)  Hospital Course    YENESIS EVEN is a 78 y.o. female with PMH of MVR (2016) c/b CVA, afib, aflutter, HTN, SVT, anxiety/depression admitted for AFib, aflutter  Assessment & Plan    #) Aflutter #) AFib #) SVT #) tikosyn  monitoring Continues to be in 2:1 aflutter with rates overall well-controlled in 90s Discussed discharge vs DCCV today, patient prefers to have DCCV today while inpatient NPO for procedure      For questions or updates, please contact [facility] Please consult [email or web address] for contact info under     Signed, Lillianna Sabel, NP  06/21/2024, 8:34 AM

## 2024-06-21 NOTE — Discharge Summary (Signed)
 DISCHARGE SUMMARY    Colleen Williamson:[id number] DOB: [birth year] DOA: 06/19/2024  PCP: Auston Reyes BIRCH, MD  Admit date: 06/19/2024 Discharge date: 06/21/2024   Recommendations for Outpatient Follow-up:  Follow up with PCP in 1-2 weeks to discuss chronic condition management Follow-up with EP and cardiology as previously scheduled   Hospital Course: Colleen Williamson is a 78 year old female with history of a flutter/fib, history of CVA, hypertension, anxiety, depression, MVP, HLD, lymphedema.  She presents to the ED on 11/24 with dizziness and lightheadedness after getting up from the table.  Per report upon EMS arrival she was in a heart rate of 170 thought to be regular rhythm and she was given adenosine  for treatment of SVT which brought her to 140s.  She then received a dose of Cardizem  which dropped her rate into the 80s. Cardiology was consulted, EP was consulted. It was ultimately determined that patient may be failing Tikosyn .  She underwent successful cardioversion on 11/26.  She was started on beta-blocker therapy.  She will need to follow-up very closely with her cardiology and EP team as she is considering transitioning back to amiodarone . After cardioversion patient was medically cleared by cardiology and EP teams to discharge home.  She endorsed feeling much better and requested for DC.  Small A-fib RVR SVT - Status post cardioversion today - For now keep Tikosyn  and metoprolol  per cardiology - Continue Eliquis  - Patient will likely discontinue Tikosyn  and transition to amiodarone  outpatient.  Close outpatient follow-up as already been established  Anxiety - On BuSpar , already on low-dose  History of CVA - Continue Eliquis  and statin  Insomnia - Patient reports difficulty falling asleep and is often up all night.  She currently takes her BuSpar  around 11 PM.  We discussed she could take it earlier in the evening.  We also discussed trialing mirtazapine.  Poor  appetite - Patient reports poor appetite secondary to not being able to taste her food for the last 8 months (Covid side effect?).  Her daughter believes she has lost weight secondary to this.  We discussed mirtazapine could be an appropriate choice to treat her insomnia and increase her appetite.  She would like to consider this medication with her PCP.  Discharge Instructions  Discharge Instructions     Call MD for:  difficulty breathing, headache or visual disturbances   Complete by: As directed    Call MD for:  persistant dizziness or light-headedness   Complete by: As directed    Call MD for:  persistant nausea and vomiting   Complete by: As directed    Call MD for:  severe uncontrolled pain   Complete by: As directed    Call MD for:  temperature >100.4   Complete by: As directed    Diet general   Complete by: As directed    Discharge instructions   Complete by: As directed    I have called in 30 days of metoprolol .  Please follow-up with your cardiologist within the next month to discuss medication changes.  As we discussed, please return to the ER if you begin experiencing dizziness, headache, palpitations, or shortness of breath.   Medication we discussed regarding insomnia and increased appetite is called mirtazapine   Increase activity slowly   Complete by: As directed       Allergies as of 06/21/2024       Reactions   Flecainide     Funny taste; hairloss   Sulfa Antibiotics Nausea Only  Medication List     STOP taking these medications    clobetasol  cream 0.05 % Commonly known as: TEMOVATE    Finacea  15 % Foam Generic drug: Azelaic Acid    meclizine 25 MG tablet Commonly known as: ANTIVERT   propranolol  10 MG tablet Commonly known as: INDERAL    propranolol  ER 60 MG 24 hr capsule Commonly known as: INDERAL  LA   sertraline  50 MG tablet Commonly known as: ZOLOFT    VITAMIN B-12 PO       TAKE these medications    amoxicillin 500 MG  capsule Commonly known as: AMOXIL Take 2,000 mg by mouth See admin instructions. Take 4 capsules (2000 mg) by mouth 1 hour prior to dental appointments.   apixaban  5 MG Tabs tablet Commonly known as: Eliquis  Take 1 tablet (5 mg total) by mouth 2 (two) times daily.   busPIRone  10 MG tablet Commonly known as: BUSPAR  Take 10 mg by mouth 2 (two) times daily.   clonazePAM  0.5 MG tablet Commonly known as: KLONOPIN  Take 0.5 mg by mouth.   dofetilide  250 MCG capsule Commonly known as: TIKOSYN  Take 1 capsule (250 mcg total) by mouth 2 (two) times daily.   ipratropium 0.03 % nasal spray Commonly known as: ATROVENT Place 1 spray into both nostrils daily as needed (allergies/congestion.).   metoprolol  tartrate 50 MG tablet Commonly known as: LOPRESSOR  Take 1 tablet (50 mg total) by mouth 2 (two) times daily.   potassium chloride  10 MEQ tablet Commonly known as: KLOR-CON  M Take 1 tablet (10 mEq total) by mouth daily.   rosuvastatin  10 MG tablet Commonly known as: CRESTOR  Take 10 mg by mouth in the morning.        Allergies  Allergen Reactions   Flecainide      Funny taste; hairloss   Sulfa Antibiotics Nausea Only    Consultations: Treatment Team:  Darron Deatrice LABOR, MD   Procedures/Studies: ECHOCARDIOGRAM COMPLETE Result Date: 06/20/2024    ECHOCARDIOGRAM REPORT   Patient Name:   Colleen Williamson Date of Exam: 06/20/2024 Medical Rec #:  [id number]        Height:       68.0 in Accession #:    [id number]       Weight:       134.0 lb Date of Birth:  1945-11-22        BSA:          1.724 m Patient Age:    78 years         BP:           106/69 mmHg Patient Gender: F                HR:           95 bpm. Exam Location:  [facility] Procedure: 2D Echo, Cardiac Doppler and Color Doppler (Both Spectral and Color            Flow Doppler were utilized during procedure). Indications:     Atrial Fibrillation I48.91  History:         Patient has prior history of Echocardiogram examinations, most                   recent 11/16/2023. Prior Cardiac Surgery, Mitral Valve Disease                  and Mitral Valve Replacement; Arrythmias:Atrial Fibrillation.  Sonographer:     Ashley McNeely-Sloane Referring Phys:  [id number] FLYJFFJI A ARIDA Diagnosing Phys: Lonni End  MD IMPRESSIONS  1. Left ventricular ejection fraction, by estimation, is 55 to 60%. The left ventricle has normal function. The left ventricle has no regional wall motion abnormalities. Left ventricular diastolic function could not be evaluated.  2. Right ventricular systolic function is normal. The right ventricular size is normal.  3. The mitral valve has been repaired/replaced. Mild mitral valve regurgitation. No evidence of mitral stenosis.  4. The aortic valve is tricuspid. Aortic valve regurgitation is mild to moderate. No aortic stenosis is present.  5. The inferior vena cava is normal in size with greater than 50% respiratory variability, suggesting right atrial pressure of 3 mmHg. FINDINGS  Left Ventricle: Left ventricular ejection fraction, by estimation, is 55 to 60%. The left ventricle has normal function. The left ventricle has no regional wall motion abnormalities. The left ventricular internal cavity size was normal in size. There is  borderline left ventricular hypertrophy. Left ventricular diastolic function could not be evaluated due to mitral valve repair. Left ventricular diastolic function could not be evaluated. Right Ventricle: The right ventricular size is normal. No increase in right ventricular wall thickness. Right ventricular systolic function is normal. Left Atrium: Left atrial size was normal in size. Right Atrium: Right atrial size was normal in size. Pericardium: There is no evidence of pericardial effusion. Mitral Valve: The mitral valve has been repaired/replaced. There is moderate thickening of the mitral valve leaflet(s). Mild mitral valve regurgitation. There is a prosthetic annuloplasty ring present in the mitral  position. No evidence of mitral valve stenosis. MV peak gradient, 3.0 mmHg. The mean mitral valve gradient is 2.0 mmHg. Tricuspid Valve: The tricuspid valve is normal in structure. Tricuspid valve regurgitation is mild. Aortic Valve: The aortic valve is tricuspid. Aortic valve regurgitation is mild to moderate. Aortic regurgitation PHT measures 293 msec. No aortic stenosis is present. Aortic valve mean gradient measures 1.0 mmHg. Aortic valve peak gradient measures 2.2 mmHg. Aortic valve area, by VTI measures 2.76 cm. Pulmonic Valve: The pulmonic valve was grossly normal. Pulmonic valve regurgitation is mild. No evidence of pulmonic stenosis. Aorta: The aortic root and ascending aorta are structurally normal, with no evidence of dilitation. Pulmonary Artery: The pulmonary artery is of normal size. Venous: The inferior vena cava is normal in size with greater than 50% respiratory variability, suggesting right atrial pressure of 3 mmHg. IAS/Shunts: The interatrial septum was not well visualized.  LEFT VENTRICLE PLAX 2D LVIDd:         3.70 cm     Diastology LVIDs:         2.70 cm     LV e' medial:    4.24 cm/s LV PW:         1.10 cm     LV E/e' medial:  23.0 LV IVS:        0.90 cm     LV e' lateral:   12.20 cm/s LVOT diam:     2.20 cm     LV E/e' lateral: 8.0 LV SV:         30 LV SV Index:   17 LVOT Area:     3.80 cm LV IVRT:       67 msec  LV Volumes (MOD) LV vol d, MOD A2C: 39.2 ml LV vol d, MOD A4C: 27.0 ml LV vol s, MOD A2C: 23.1 ml LV vol s, MOD A4C: 14.9 ml LV SV MOD A2C:     16.1 ml LV SV MOD A4C:     27.0 ml LV  SV MOD BP:      13.8 ml RIGHT VENTRICLE            IVC RV Basal diam:  3.60 cm    IVC diam: 1.20 cm RV Mid diam:    3.20 cm RV S prime:     6.15 cm/s  PULMONARY VEINS TAPSE (M-mode): 0.7 cm     Systolic Velocity: 66.60 cm/s LEFT ATRIUM             Index        RIGHT ATRIUM           Index LA diam:        3.00 cm 1.74 cm/m   RA Area:     13.10 cm LA Vol (A2C):   35.3 ml 20.48 ml/m  RA Volume:   32.30  ml  18.74 ml/m LA Vol (A4C):   22.9 ml 13.28 ml/m LA Biplane Vol: 29.1 ml 16.88 ml/m  AORTIC VALVE AV Area (Vmax):    2.49 cm AV Area (Vmean):   2.37 cm AV Area (VTI):     2.76 cm AV Vmax:           74.30 cm/s AV Vmean:          51.300 cm/s AV VTI:            0.108 m AV Peak Grad:      2.2 mmHg AV Mean Grad:      1.0 mmHg LVOT Vmax:         48.70 cm/s LVOT Vmean:        32.000 cm/s LVOT VTI:          0.078 m LVOT/AV VTI ratio: 0.72 AI PHT:            293 msec  AORTA Ao Root diam: 3.40 cm Ao Asc diam:  3.50 cm MITRAL VALVE MV Area (PHT): 5.97 cm    SHUNTS MV Area VTI:   1.52 cm    Systemic VTI:  0.08 m MV Peak grad:  3.0 mmHg    Systemic Diam: 2.20 cm MV Mean grad:  2.0 mmHg MV Vmax:       0.87 m/s MV Vmean:      62.3 cm/s MV Decel Time: 127 msec MV E velocity: 97.50 cm/s Lonni Hanson MD Electronically signed by Lonni Hanson MD Signature Date/Time: 06/20/2024/1:43:45 PM    Final    DG Chest Port 1 View Result Date: 06/19/2024 CLINICAL DATA:  Atrial fibrillation. EXAM: PORTABLE CHEST 1 VIEW COMPARISON:  Chest radiograph dated 11/15/2023 FINDINGS: No focal consolidation, pleural effusion or pneumothorax. Stable cardiac silhouette. Median sternotomy wires and cardiac valve repair. No acute osseous pathology. IMPRESSION: No active disease. Electronically Signed   By: Vanetta Chou M.D.   On: 06/19/2024 11:16      Discharge Exam: Vitals:   06/21/24 1415 06/21/24 1604  BP: 125/78 104/68  Pulse: 62 71  Resp: 20 16  Temp:  98.1 F (36.7 C)  SpO2: 99% 100%   Vitals:   06/21/24 1332 06/21/24 1345 06/21/24 1415 06/21/24 1604  BP:  107/75 125/78 104/68  Pulse: 66 61 62 71  Resp: 15 17 20 16   Temp:  98.2 F (36.8 C)  98.1 F (36.7 C)  TempSrc:  Oral    SpO2: 97% 99% 99% 100%  Weight:      Height:        Constitutional:  Normal appearance. Non toxic-appearing.  HENT: Head Normocephalic and atraumatic.  Mucous membranes  are moist.  Eyes:  Extraocular intact. Conjunctivae normal.   Cardiovascular: Rate and Rhythm: Normal rate and regular rhythm.  Pulmonary: Non labored, symmetric rise of chest wall.  Skin: warm and dry. not jaundiced.  Neurological: No focal deficit present. alert. Oriented.  Psychiatric: Mood and Affect congruent.    The results of significant diagnostics from this hospitalization (including imaging, microbiology, ancillary and laboratory) are listed below for reference.     Microbiology: No results found for this or any previous visit (from the past 240 hours).   Labs: BNP (last 3 results) No results for input(s): BNP in the last 8760 hours. Basic Metabolic Panel: Recent Labs  Lab 06/19/24 0956 06/21/24 0905  NA 140 139  K 4.1 4.2  CL 103 103  CO2 27 27  GLUCOSE 118* 96  BUN 16 22  CREATININE 0.78 0.72  CALCIUM  9.6 9.5  MG 2.0  --    Liver Function Tests: Recent Labs  Lab 06/21/24 0905  AST 28  ALT 16  ALKPHOS 92  BILITOT 1.5*  PROT 6.8  ALBUMIN 4.1   No results for input(s): LIPASE, AMYLASE in the last 168 hours. No results for input(s): AMMONIA in the last 168 hours. CBC: Recent Labs  Lab 06/19/24 0956 06/21/24 0905  WBC 5.6 5.4  NEUTROABS 3.4 3.3  HGB 15.6* 15.9*  HCT 47.8* 49.8*  MCV 88.4 87.4  PLT 170 182   Cardiac Enzymes: No results for input(s): CKTOTAL, CKMB, CKMBINDEX, TROPONINI in the last 168 hours. BNP: Invalid input(s): POCBNP CBG: No results for input(s): GLUCAP in the last 168 hours. D-Dimer Recent Labs    06/20/24 0855  DDIMER <0.27   Hgb A1c No results for input(s): HGBA1C in the last 72 hours. Lipid Profile No results for input(s): CHOL, HDL, LDLCALC, TRIG, CHOLHDL, LDLDIRECT in the last 72 hours. Thyroid  function studies No results for input(s): TSH, T4TOTAL, T3FREE, THYROIDAB in the last 72 hours.  Invalid input(s): FREET3 Anemia work up No results for input(s): VITAMINB12, FOLATE, FERRITIN, TIBC, IRON, RETICCTPCT in the  last 72 hours. Urinalysis    Component Value Date/Time   COLORURINE YELLOW 07/03/2014 0903   APPEARANCEUR CLOUDY (A) 07/03/2014 0903   LABSPEC 1.039 (H) 07/03/2014 0903   PHURINE 8.0 07/03/2014 0903   GLUCOSEU NEGATIVE 07/03/2014 0903   HGBUR NEGATIVE 07/03/2014 0903   BILIRUBINUR NEGATIVE 07/03/2014 0903   KETONESUR NEGATIVE 07/03/2014 0903   PROTEINUR 100 (A) 07/03/2014 0903   UROBILINOGEN 0.2 07/03/2014 0903   NITRITE NEGATIVE 07/03/2014 0903   LEUKOCYTESUR NEGATIVE 07/03/2014 0903   Sepsis Labs Recent Labs  Lab 06/19/24 0956 06/21/24 0905  WBC 5.6 5.4   Microbiology No results found for this or any previous visit (from the past 240 hours).   Time coordinating discharge: 32 min   SIGNED: Darlys Buis, DO [facility] 06/21/2024, 4:28 PM Pager   If 7PM-7AM, please contact night-coverage

## 2024-06-21 NOTE — Transfer of Care (Addendum)
 Immediate Anesthesia Transfer of Care Note  Patient: Colleen Williamson  Procedure(s) Performed: CARDIOVERSION  Patient Location: [facility]  Anesthesia Type:General  Level of Consciousness: drowsy  Airway & Oxygen Therapy: Patient Spontanous Breathing  Post-op Assessment: Report given to RN and Post -op Vital signs reviewed and stable  Post vital signs: Reviewed and stable  Last Vitals:  Vitals Value Taken Time  BP 98/71 06/21/24 13:38  Temp    Pulse 66 06/21/24 13:38  Resp 15 06/21/24 13:38  SpO2 97 % 06/21/24 13:38    Last Pain:  Vitals:   06/21/24 1236  TempSrc: Oral  PainSc: 0-No pain      Patients Stated Pain Goal: 0 (06/20/24 2000)  Complications: No notable events documented.

## 2024-06-21 NOTE — Anesthesia Preprocedure Evaluation (Addendum)
 Anesthesia Evaluation  Patient identified by MRN, date of birth, ID band Patient awake    Reviewed: Allergy & Precautions, H&P , NPO status , Patient's Chart, lab work & pertinent test results  History of Anesthesia Complications Negative for: history of anesthetic complications  Airway Mallampati: I  TM Distance: >3 FB Neck ROM: Full    Dental  (+) Teeth Intact, Dental Advisory Given   Pulmonary former smoker   breath sounds clear to auscultation       Cardiovascular hypertension, (-) CAD + dysrhythmias Atrial Fibrillation + Valvular Problems/Murmurs  Rhythm:Regular Rate:Normal  10/2022 ECHO:  NORMAL LEFT VENTRICULAR SYSTOLIC FUNCTION  NORMAL RIGHT VENTRICULAR SYSTOLIC FUNCTION  MILD VALVULAR REGURGITATION (See above)  NO VALVULAR STENOSIS    Hx of Mvr 2015   Neuro/Psych  PSYCHIATRIC DISORDERS Anxiety Depression    CVA    GI/Hepatic negative GI ROS, Neg liver ROS,,,  Endo/Other  negative endocrine ROS    Renal/GU negative Renal ROS  negative genitourinary   Musculoskeletal negative musculoskeletal ROS (+)    Abdominal Normal abdominal exam  (+)   Peds negative pediatric ROS (+)  Hematology negative hematology ROS (+) Eliquis    Anesthesia Other Findings   Reproductive/Obstetrics negative OB ROS                              Anesthesia Physical Anesthesia Plan  ASA: 3  Anesthesia Plan: General   Post-op Pain Management:    Induction: Intravenous  PONV Risk Score and Plan: 0 and Propofol  infusion and Treatment may vary due to age or medical condition  Airway Management Planned: Natural Airway  Additional Equipment:   Intra-op Plan:   Post-operative Plan:   Informed Consent: I have reviewed the patients History and Physical, chart, labs and discussed the procedure including the risks, benefits and alternatives for the proposed anesthesia with the patient or authorized  representative who has indicated his/her understanding and acceptance.     Dental advisory given  Plan Discussed with: CRNA  Anesthesia Plan Comments:          Anesthesia Quick Evaluation

## 2024-06-21 NOTE — Progress Notes (Signed)
 Reviewed discharge instructions with pt and family. All verbalize understanding. IV removed, tele removed. Pt has belongings

## 2024-06-21 NOTE — Plan of Care (Signed)
  Problem: Education: Goal: Knowledge of General Education information will improve Description: Including pain rating scale, medication(s)/side effects and non-pharmacologic comfort measures 06/21/2024 1647 by Fatiha Guzy K, RN Outcome: Adequate for Discharge 06/21/2024 0941 by Lequan Dobratz K, RN Outcome: Progressing   Problem: Health Behavior/Discharge Planning: Goal: Ability to manage health-related needs will improve Outcome: Adequate for Discharge   Problem: Clinical Measurements: Goal: Ability to maintain clinical measurements within normal limits will improve 06/21/2024 1647 by Miroslava Santellan K, RN Outcome: Adequate for Discharge 06/21/2024 0941 by Freeda Leon POUR, RN Outcome: Progressing Goal: Will remain free from infection Outcome: Adequate for Discharge Goal: Diagnostic test results will improve 06/21/2024 1647 by Kanton Kamel K, RN Outcome: Adequate for Discharge 06/21/2024 0941 by Gessica Jawad K, RN Outcome: Progressing Goal: Respiratory complications will improve Outcome: Adequate for Discharge Goal: Cardiovascular complication will be avoided Outcome: Adequate for Discharge   Problem: Activity: Goal: Risk for activity intolerance will decrease Outcome: Adequate for Discharge   Problem: Nutrition: Goal: Adequate nutrition will be maintained Outcome: Adequate for Discharge   Problem: Coping: Goal: Level of anxiety will decrease Outcome: Adequate for Discharge   Problem: Elimination: Goal: Will not experience complications related to bowel motility Outcome: Adequate for Discharge Goal: Will not experience complications related to urinary retention Outcome: Adequate for Discharge   Problem: Pain Managment: Goal: General experience of comfort will improve and/or be controlled Outcome: Adequate for Discharge   Problem: Safety: Goal: Ability to remain free from injury will improve Outcome: Adequate for Discharge   Problem: Skin  Integrity: Goal: Risk for impaired skin integrity will decrease Outcome: Adequate for Discharge

## 2024-06-21 NOTE — Anesthesia Postprocedure Evaluation (Signed)
 Anesthesia Post Note  Patient: Colleen Williamson  Procedure(s) Performed: CARDIOVERSION  Patient location during evaluation: PACU Anesthesia Type: General Level of consciousness: awake and alert Pain management: pain level controlled Vital Signs Assessment: post-procedure vital signs reviewed and stable Respiratory status: spontaneous breathing, nonlabored ventilation and respiratory function stable Cardiovascular status: blood pressure returned to baseline and stable Postop Assessment: no apparent nausea or vomiting Anesthetic complications: no   No notable events documented.   Last Vitals:  Vitals:   06/21/24 1345 06/21/24 1415  BP: 107/75 125/78  Pulse: 61 62  Resp: 17 20  Temp: 36.8 C   SpO2: 99% 99%    Last Pain:  Vitals:   06/21/24 1415  TempSrc:   PainSc: 0-No pain                 [name]

## 2024-06-21 NOTE — Plan of Care (Signed)

## 2024-06-21 NOTE — CV Procedure (Signed)
Cardioversion procedure note °For atrial fibrillation, persistent ° °Procedure Details: ° °Consent: Risks of procedure as well as the alternatives and risks of each were explained to the (patient/caregiver).  Consent for procedure obtained. ° °Time Out: Verified patient identification, verified procedure, site/side was marked, verified correct patient position, special equipment/implants available, medications/allergies/relevent history reviewed, required imaging and test results available.  Performed ° °Patient placed on cardiac monitor, pulse oximetry, supplemental oxygen as necessary.   °Sedation given: propofol IV, Dr. Johnson °Pacer pads placed anterior and posterior chest. ° ° °Cardioverted 1 time(s).   °Cardioverted at  150 J. Synchronized biphasic °Converted to NSR ° ° °Evaluation: °Findings: Post procedure EKG shows: NSR °Complications: None °Patient did tolerate procedure well. ° °Time Spent Directly with the Patient: ° °45 minutes  ° °Colleen Williamson, M.D., Ph.D.  °

## 2024-06-23 ENCOUNTER — Encounter: Payer: Self-pay | Admitting: Cardiovascular Disease

## 2024-06-23 ENCOUNTER — Other Ambulatory Visit: Payer: Self-pay

## 2024-06-23 ENCOUNTER — Telehealth: Payer: Self-pay | Admitting: Physician Assistant

## 2024-06-23 ENCOUNTER — Emergency Department

## 2024-06-23 ENCOUNTER — Inpatient Hospital Stay
Admission: EM | Admit: 2024-06-23 | Discharge: 2024-06-29 | DRG: 310 | Disposition: A | Discharge status: Home / Self Care | Attending: Internal Medicine | Admitting: Internal Medicine

## 2024-06-23 DIAGNOSIS — Z90710 Acquired absence of both cervix and uterus: Secondary | ICD-10-CM

## 2024-06-23 DIAGNOSIS — F419 Anxiety disorder, unspecified: Secondary | ICD-10-CM | POA: Diagnosis not present

## 2024-06-23 DIAGNOSIS — I483 Typical atrial flutter: Secondary | ICD-10-CM | POA: Diagnosis not present

## 2024-06-23 DIAGNOSIS — I493 Ventricular premature depolarization: Secondary | ICD-10-CM | POA: Diagnosis present

## 2024-06-23 DIAGNOSIS — F32A Depression, unspecified: Secondary | ICD-10-CM | POA: Diagnosis present

## 2024-06-23 DIAGNOSIS — E785 Hyperlipidemia, unspecified: Secondary | ICD-10-CM | POA: Diagnosis not present

## 2024-06-23 DIAGNOSIS — I484 Atypical atrial flutter: Secondary | ICD-10-CM | POA: Diagnosis present

## 2024-06-23 DIAGNOSIS — I471 Supraventricular tachycardia, unspecified: Secondary | ICD-10-CM | POA: Diagnosis not present

## 2024-06-23 DIAGNOSIS — Z8673 Personal history of transient ischemic attack (TIA), and cerebral infarction without residual deficits: Secondary | ICD-10-CM

## 2024-06-23 DIAGNOSIS — Z7901 Long term (current) use of anticoagulants: Secondary | ICD-10-CM

## 2024-06-23 DIAGNOSIS — I4510 Unspecified right bundle-branch block: Secondary | ICD-10-CM | POA: Diagnosis present

## 2024-06-23 DIAGNOSIS — R7989 Other specified abnormal findings of blood chemistry: Secondary | ICD-10-CM | POA: Diagnosis not present

## 2024-06-23 DIAGNOSIS — I480 Paroxysmal atrial fibrillation: Secondary | ICD-10-CM | POA: Diagnosis not present

## 2024-06-23 DIAGNOSIS — E876 Hypokalemia: Secondary | ICD-10-CM | POA: Diagnosis not present

## 2024-06-23 DIAGNOSIS — R0602 Shortness of breath: Secondary | ICD-10-CM | POA: Diagnosis present

## 2024-06-23 DIAGNOSIS — I4891 Unspecified atrial fibrillation: Secondary | ICD-10-CM | POA: Diagnosis not present

## 2024-06-23 DIAGNOSIS — I10 Essential (primary) hypertension: Secondary | ICD-10-CM | POA: Diagnosis not present

## 2024-06-23 DIAGNOSIS — Z87891 Personal history of nicotine dependence: Secondary | ICD-10-CM

## 2024-06-23 DIAGNOSIS — R002 Palpitations: Secondary | ICD-10-CM | POA: Diagnosis not present

## 2024-06-23 DIAGNOSIS — I4819 Other persistent atrial fibrillation: Secondary | ICD-10-CM | POA: Diagnosis present

## 2024-06-23 DIAGNOSIS — Z952 Presence of prosthetic heart valve: Secondary | ICD-10-CM

## 2024-06-23 DIAGNOSIS — Z803 Family history of malignant neoplasm of breast: Secondary | ICD-10-CM

## 2024-06-23 DIAGNOSIS — Z79899 Other long term (current) drug therapy: Secondary | ICD-10-CM

## 2024-06-23 DIAGNOSIS — Z833 Family history of diabetes mellitus: Secondary | ICD-10-CM

## 2024-06-23 DIAGNOSIS — Z1152 Encounter for screening for COVID-19: Secondary | ICD-10-CM

## 2024-06-23 LAB — BASIC METABOLIC PANEL WITH GFR
Anion gap: 10 (ref 5–15)
BUN: 24 mg/dL — ABNORMAL HIGH (ref 8–23)
CO2: 28 mmol/L (ref 22–32)
Calcium: 9.6 mg/dL (ref 8.9–10.3)
Chloride: 107 mmol/L (ref 98–111)
Creatinine, Ser: 0.73 mg/dL (ref 0.44–1.00)
GFR, Estimated: >60 mL/min (ref >60)
Glucose, Bld: 75 mg/dL (ref 70–99)
Potassium: 3.6 mmol/L (ref 3.5–5.1)
Sodium: 144 mmol/L (ref 135–145)

## 2024-06-23 LAB — CBC
HCT: 47 % — ABNORMAL HIGH (ref 36.0–46.0)
Hemoglobin: 15 g/dL (ref 12.0–15.0)
MCH: 29 pg (ref 26.0–34.0)
MCHC: 31.9 g/dL (ref 30.0–36.0)
MCV: 90.7 fL (ref 80.0–100.0)
Platelets: 183 K/uL (ref 150–400)
RBC: 5.18 MIL/uL — ABNORMAL HIGH (ref 3.87–5.11)
RDW: 13.4 % (ref 11.5–15.5)
WBC: 6.6 K/uL (ref 4.0–10.5)
nRBC: 0 % (ref 0.0–0.2)

## 2024-06-23 LAB — PRO BRAIN NATRIURETIC PEPTIDE: Pro Brain Natriuretic Peptide: 1078 pg/mL — ABNORMAL HIGH (ref <300.0)

## 2024-06-23 LAB — TROPONIN T, HIGH SENSITIVITY: Troponin T High Sensitivity: 24 ng/L — ABNORMAL HIGH (ref 0–19)

## 2024-06-23 MED ORDER — METOPROLOL TARTRATE 5 MG/5ML IV SOLN
INTRAVENOUS | Status: AC
  Filled 2024-06-23: qty 5

## 2024-06-23 MED ORDER — METOPROLOL TARTRATE 5 MG/5ML IV SOLN
5.0000 mg | Freq: Once | INTRAVENOUS | Status: AC
  Administered 2024-06-23: 5 mg via INTRAVENOUS

## 2024-06-23 NOTE — ED Notes (Addendum)
 EKG PERFORMED IN TRIAGE AND GIVEN TO DR. SIADECKI, PATIENT WAS BROUGHT TO ROOM 18 VIA  WHEELCHAIR BY THIS [facility] AND MELISSA, EDT. PATIENT WAS PLACED ON MONITOR. CODY, RN AWARE.

## 2024-06-23 NOTE — Telephone Encounter (Signed)
 Patient's daughter contacted after-hours answering service regarding episodes of dizziness and palpitation yesterday that lasted about 10 minutes.  She recently underwent cardioversion.  She was trying to walk downstairs yesterday when she started having the symptoms.  Her current systolic blood pressure is about 138.  Heart rate in the 50s.  I suspect she is currently in sinus rhythm.  Looking at the previous EKG, she when she is in A-fib, her heart rate is typically in the 90s to 100 range.  When she is in sinus rhythm, heart rate typically is in the 50 to 60s range.  I recommended fluid hydration.  She should continue to monitor recurrence of dizziness.  She has been compliant with metoprolol  tartrate 50 mg twice a day and Tikosyn  250 mcg twice a day.  She has follow-up with Dr. Cindie next month.  Daughter also inquired about OTC sleep medications for the patient.  I recommended melatonin or Benadryl.  Daughter says our clinical pharmacist has previously informed the patient to never mix Benadryl with her current medication.  I was unable to find any obvious interaction between Benadryl and Tikosyn .  Daughter says they will try melatonin first.

## 2024-06-23 NOTE — ED Provider Notes (Signed)
 [facility] Provider Note    Event Date/Time   First MD Initiated Contact with Patient 06/23/24 2122     (approximate)   History   Palpitations   HPI  Colleen Williamson is a 78 y.o. female with a history of paroxysmal atrial fibrillation on Tikosyn , hypertension, anxiety, and depression who presents with acute onset of palpitations this evening.  The patient was just discharged from the hospital 2 days ago after being treated for breakthrough A-fib, and per the daughter she had been somewhat fatigued and weak since being discharged from the hospital although had not had severe palpitations like today.  The patient states that she feels somewhat lightheaded.  She denies shortness of breath or chest pain.  She denies cough or fever.  She has no leg swelling.  I reviewed the past medical records.  The patient was just seen by me in the ED on 11/24 and admitted to the hospitalist service until 11/26.  She was valued by cardiology.  She was successfully cardioverted on 11/26. Tikosyn  was continued and she was started on metoprolol .     Physical Exam   Triage Vital Signs: ED Triage Vitals  Encounter Vitals Group     BP --      Girls Systolic BP Percentile --      Girls Diastolic BP Percentile --      Boys Systolic BP Percentile --      Boys Diastolic BP Percentile --      Pulse Rate 06/23/24 2122 (!) 166     Resp --      Temp --      Temp src --      SpO2 --      Weight --      Height --      Head Circumference --      Peak Flow --      Pain Score 06/23/24 2119 0     Pain Loc --      Pain Education --      Exclude from Growth Chart --     Most recent vital signs: Vitals:   06/23/24 2145 06/23/24 2200  BP: 118/82 122/86  Pulse: 81 79  Resp: 17 17  Temp:    SpO2: 99% 100%     General: Awake, no distress.  CV:  Good peripheral perfusion.  Tachycardic, irregular rhythm. Resp:  Normal effort.  Lungs CTAB. Abd:  No distention.  Other:  No  peripheral edema.   ED Results / Procedures / Treatments   Labs (all labs ordered are listed, but only abnormal results are displayed) Labs Reviewed  BASIC METABOLIC PANEL WITH GFR - Abnormal; Notable for the following components:      Result Value   BUN 24 (*)    All other components within normal limits  CBC - Abnormal; Notable for the following components:   RBC 5.18 (*)    HCT 47.0 (*)    All other components within normal limits  PRO BRAIN NATRIURETIC PEPTIDE - Abnormal; Notable for the following components:   Pro Brain Natriuretic Peptide 1,078.0 (*)    All other components within normal limits  TROPONIN T, HIGH SENSITIVITY - Abnormal; Notable for the following components:   Troponin T High Sensitivity 24 (*)    All other components within normal limits  TROPONIN T, HIGH SENSITIVITY     EKG  ED ECG REPORT I, Waylon Cassis, the attending physician, personally viewed and interpreted this ECG.  Date: 06/23/2024 EKG Time: 2120 Rate: 165 Rhythm: Borderline wide-complex tachycardia, possible SVT QRS Axis: normal Intervals: normal ST/T Wave abnormalities: Nonspecific ST abnormalities Narrative Interpretation: no evidence of acute ischemia  ED ECG REPORT I, Waylon Cassis, the attending physician, personally viewed and interpreted this ECG.  Date: 06/23/2024 EKG Time: 2136 Rate: Sinus tachycardia with multiple ectopic beats and PVCs Rhythm: normal sinus rhythm QRS Axis: normal Intervals: Nonspecific IVCD ST/T Wave abnormalities: LVH with repolarization abnormality Narrative Interpretation: no evidence of acute ischemia  ED ECG REPORT I, Waylon Cassis, the attending physician, personally viewed and interpreted this ECG.  Date: 06/23/2024 EKG Time: 2138 Rate: 80 Rhythm: normal sinus rhythm with PVCs QRS Axis: normal Intervals: normal ST/T Wave abnormalities: normal Narrative Interpretation: no evidence of acute ischemia   RADIOLOGY  Chest  x-ray: I independently viewed and interpreted the images; there is no focal consolidation or edema   PROCEDURES:  Critical Care performed: Yes, see critical care procedure note(s)  .Critical Care  Performed by: Cassis Waylon, MD Authorized by: Cassis Waylon, MD   Critical care provider statement:    Critical care time (minutes):  30   Critical care time was exclusive of:  Separately billable procedures and treating other patients   Critical care was necessary to treat or prevent imminent or life-threatening deterioration of the following conditions:  Cardiac failure   Critical care was time spent personally by me on the following activities:  Development of treatment plan with patient or surrogate, discussions with consultants, evaluation of patient's response to treatment, examination of patient, ordering and review of laboratory studies, ordering and review of radiographic studies, ordering and performing treatments and interventions, pulse oximetry, re-evaluation of patient's condition, review of old charts and obtaining history from patient or surrogate   Care discussed with: admitting provider      MEDICATIONS ORDERED IN ED: Medications  metoprolol  tartrate (LOPRESSOR ) injection 5 mg (0 mg Intravenous Hold 06/23/24 2133)     IMPRESSION / MDM / ASSESSMENT AND PLAN / ED COURSE  I reviewed the triage vital signs and the nursing notes.  78 year old female with PMH as noted above including A-fib on Tikosyn , metoprolol , and Eliquis , presents with recurrent palpitations and significant tachycardia.  Differential diagnosis includes, but is not limited to, A-fib, a flutter, SVT, V. tach.  Initial EKG showed a tachycardia with a rate of 165, read by the machine as supraventricular tachycardia although it could also be atrial fibrillation with aberrancy.  It does not appear consistent with V. tach.  The computer read was STEMI, however the ST changes appear graded related and  the patient did not have any chest pain.  Therefore there is no indication to activate code STEMI.  The patient responded well to IV beta-blockers during her last visit.  I ordered a dose of IV metoprolol  and within several minutes she had converted to a sinus rhythm with some ectopic beats.  After short time, her rate settled in the 70s to 80s in sinus rhythm.  We will obtain lab workup chest x-ray, and reassess.  Patient's presentation is most consistent with acute presentation with potential threat to life or bodily function.  The patient is on the cardiac monitor to evaluate for evidence of arrhythmia and/or significant heart rate changes.  ----------------------------------------- 11:02 PM on 06/23/2024 -----------------------------------------  The patient has remained in a sinus rhythm with some ectopic beats with a rate in the 80s.  She is asymptomatic at this time.  BMP and CBC are unremarkable.  Troponin  is not significantly elevated.  BNP is slightly elevated from her last ED visit.  Chest x-ray is clear.  Although the patient has remained in sinus rhythm, she had a similar presentation last time and as soon as she got up and did any minimal exertion she went right back into a rapid rate.  Also given her complex history and the recent medication changes, I felt that it would be advisable to admit the patient overnight for further monitoring and for cardiology evaluation in the morning.  The patient agrees with this plan.  I consulted Dr. Lawence from the hospitalist service; based on our discussion he agrees to evaluate the patient for admission.  FINAL CLINICAL IMPRESSION(S) / ED DIAGNOSES   Final diagnoses:  None     Rx / DC Orders   ED Discharge Orders     None        Note:  This document was prepared using Dragon voice recognition software and may include unintentional dictation errors.    Jacolyn Pae, MD 06/23/24 2324

## 2024-06-23 NOTE — ED Notes (Signed)
 Repeat EKG following IV metoprolol  obtained. Provided to EDP Siadecki

## 2024-06-23 NOTE — ED Triage Notes (Addendum)
 Pt arrives with c/o onset of palpitations just PTA. She had cardioversion (hx of svt/afib-on eliquis ) several days ago, has been feeling weak since then. She took her eliquis  and metoprolol  just prior to arrival. Alert/oriented. Endorses some SOB, denies pain. Afib RVR in triage.

## 2024-06-23 NOTE — H&P (Incomplete)
 [place]   PATIENT NAME: Colleen Williamson    MR#:  [id number]  DATE OF BIRTH:  08-Sep-1945  DATE OF ADMISSION:  06/23/2024  PRIMARY CARE PHYSICIAN: Auston Reyes BIRCH, MD   Patient is coming from: Home  REQUESTING/REFERRING PHYSICIAN: Siadecki, Sebastien, MD  CHIEF COMPLAINT:   Chief Complaint  Patient presents with   Palpitations    HISTORY OF PRESENT ILLNESS:  Colleen Williamson is a 78 y.o. Caucasian female with medical history significant for anxiety, paroxysmal atrial fibrillation/flutter, PSVT, CVA and anxiety, who presented to the emergency room with acute onset of palpitations that started at 7:30 PM without chest pain.  No fever or chills.  No cough or wheezing or dyspnea.  No nausea or vomiting or abdominal pain.  No dysuria, oliguria or hematuria or flank pain.  She was admitted here on Monday and was discharged on Wednesday and it was not until Thursday when he started with malaise and not feeling good.  ED Course: When the patient came to the ER, BP was 133/101 with heart rate of 166 and otherwise normal vital signs.  Labs reviewed BUN of 24 with otherwise unremarkable BMP.  proBNP negative.  Was 1078 and high sensitive troponin was 24 and later the same.  CBC was unremarkable. EKG as reviewed by me : EKG showed supraventricular cardia with a rate of 165 with incomplete right bundle branch block, and minimal voltage criteria for LVH and Q waves anteroseptally with T wave inversion laterally.  It also showed intraventricular conduction delay and widened QRS complexes.. Imaging: Portable chest x-ray showed evidence of previous median sternotomy/mitral valve replacement with no acute cardiopulmonary disease.  The patient was given 5 mg of IV Lopressor  converting her to normal sinus rhythm.  She will be admitted to an observation progressive unit bed for further evaluation and management. PAST MEDICAL HISTORY:   Past Medical History:  Diagnosis Date   Abnormal  mammogram, unspecified 2014   Anxiety    Atypical atrial flutter (HCC)    a. 12/2014 s/p RFCA.   History of cardiac cath    a. 03/2014 Cath [facility]): Nl cors.   Mitral valve disorder    a. 03/2014 s/p MV repair [facility]); b. 06/2016 Echo: EF 55-60%, no rwma, Nl fxn'ing MV w/o stenosis. Mildly dil LA/RA.   PAF (paroxysmal atrial fibrillation) (HCC)    a. 06/2016 s/p DCCV; b.  CHA2DS2VASc = 4-->Eliquis . Rhythm mgmt w/ amio (prev intol to flecainide ).   PSVT (paroxysmal supraventricular tachycardia)    Stroke (HCC)    a. 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    PAST SURGICAL HISTORY:   Past Surgical History:  Procedure Laterality Date   ABDOMINAL HYSTERECTOMY  2004   ATRIAL FIBRILLATION ABLATION N/A 06/10/2023   Procedure: ATRIAL FIBRILLATION ABLATION;  Surgeon: Cindie Ole DASEN, MD;  Location: [facility] INVASIVE CV LAB;  Service: Cardiovascular;  Laterality: N/A;   BREAST CYST ASPIRATION  15 YRS AGO   CARDIOVERSION N/A 09/21/2014   Procedure: CARDIOVERSION;  Surgeon: Maude JAYSON Emmer, MD;  Location: [facility] ENDOSCOPY;  Service: Cardiovascular;  Laterality: N/A;   CARDIOVERSION N/A 02/26/2023   Procedure: CARDIOVERSION;  Surgeon: Perla Evalene PARAS, MD;  Location: [facility] ORS;  Service: Cardiovascular;  Laterality: N/A;   CARDIOVERSION N/A 11/16/2023   Procedure: CARDIOVERSION;  Surgeon: Shlomo Wilbert SAUNDERS, MD;  Location: [facility] INVASIVE CV LAB;  Service: Cardiovascular;  Laterality: N/A;   CARDIOVERSION N/A 06/21/2024   Procedure: CARDIOVERSION;  Surgeon: Perla Evalene  J, MD;  Location: [facility] ORS;  Service: Cardiovascular;  Laterality: N/A;   COLONOSCOPY WITH PROPOFOL  N/A 05/15/2020   Procedure: COLONOSCOPY WITH PROPOFOL ;  Surgeon: Toledo, Ladell POUR, MD;  Location: [facility] ENDOSCOPY;  Service: Endoscopy;  Laterality: N/A;   ELECTROPHYSIOLOGIC STUDY N/A 11/28/2014   Procedure: Cardioversion;  Surgeon: Vinie DELENA Jude, MD;  Location: [facility] INVASIVE CV LAB;  Service: Cardiovascular;  Laterality: N/A;    ELECTROPHYSIOLOGIC STUDY N/A 12/27/2014   Procedure: A-Flutter;  Surgeon: Danelle LELON Birmingham, MD;  Location: [facility] INVASIVE CV LAB;  Service: Cardiovascular;  Laterality: N/A;   ELECTROPHYSIOLOGIC STUDY N/A 11/28/2014   Procedure: Cardioversion;  Surgeon: Vinie DELENA Jude, MD;  Location: [facility] ORS;  Service: Cardiovascular;  Laterality: N/A;  Have discussed with Dr. Debby   ELECTROPHYSIOLOGIC STUDY N/A 07/13/2016   Procedure: Cardioversion;  Surgeon: Deatrice DELENA Cage, MD;  Location: [facility] ORS;  Service: Cardiovascular;  Laterality: N/A;   EYE SURGERY  2007   MITRAL VALVE REPAIR  05/2014   RADIOLOGY WITH ANESTHESIA N/A 07/03/2014   Procedure: RADIOLOGY WITH ANESTHESIA;  Surgeon: Thyra POUR Nash, MD;  Location: [facility] OR;  Service: Radiology;  Laterality: N/A;   TRANSESOPHAGEAL ECHOCARDIOGRAM (CATH LAB) N/A 11/16/2023   Procedure: TRANSESOPHAGEAL ECHOCARDIOGRAM;  Surgeon: Shlomo Wilbert SAUNDERS, MD;  Location: [facility] INVASIVE CV LAB;  Service: Cardiovascular;  Laterality: N/A;    SOCIAL HISTORY:   Social History   Tobacco Use   Smoking status: Former   Smokeless tobacco: Never   Tobacco comments:    quit smoking in her 20's  Substance Use Topics   Alcohol use: No    Alcohol/week: 0.0 standard drinks of alcohol    FAMILY HISTORY:   Family History  Problem Relation Age of Onset   Cancer Father    Cancer Other        BOTH SIDES OF THE FAMILY   Diabetes Other        MATERNAL SIDE OF THE FAMILY   Breast cancer Maternal Aunt     DRUG ALLERGIES:   Allergies  Allergen Reactions   Flecainide      Funny taste; hairloss   Sulfa Antibiotics Nausea Only    REVIEW OF SYSTEMS:   ROS As per history of present illness. All pertinent systems were reviewed above. Constitutional, HEENT, cardiovascular, respiratory, GI, GU, musculoskeletal, neuro, psychiatric, endocrine, integumentary and hematologic systems were reviewed and are otherwise negative/unremarkable except for positive findings mentioned above in the  HPI.   MEDICATIONS AT HOME:   Prior to Admission medications   Medication Sig Start Date End Date Taking? Authorizing Provider  amoxicillin (AMOXIL) 500 MG capsule Take 2,000 mg by mouth See admin instructions. Take 4 capsules (2000 mg) by mouth 1 hour prior to dental appointments.    [provider]  apixaban  (ELIQUIS ) 5 MG TABS tablet Take 1 tablet (5 mg total) by mouth 2 (two) times daily. 12/01/21   Gollan, Timothy J, MD  busPIRone  (BUSPAR ) 10 MG tablet Take 10 mg by mouth 2 (two) times daily. 06/05/24 06/05/25  [provider]  clonazePAM  (KLONOPIN ) 0.5 MG tablet Take 0.5 mg by mouth. 12/15/23 03/29/24  [provider]  dofetilide  (TIKOSYN ) 250 MCG capsule Take 1 capsule (250 mcg total) by mouth 2 (two) times daily. 03/08/24   Loistine Sober, NP  ipratropium (ATROVENT) 0.03 % nasal spray Place 1 spray into both nostrils daily as needed (allergies/congestion.). 09/04/20 03/29/24  [provider]  metoprolol  tartrate (LOPRESSOR ) 50 MG tablet Take 1 tablet (50 mg total) by mouth  2 (two) times daily. 06/21/24 07/21/24  Dezii, Alexandra, DO  potassium chloride  (KLOR-CON  M) 10 MEQ tablet Take 1 tablet (10 mEq total) by mouth daily. 06/09/24   Cindie Ole DASEN, MD  rosuvastatin  (CRESTOR ) 10 MG tablet Take 10 mg by mouth in the morning. 03/06/22   [provider]      VITAL SIGNS:  Blood pressure 110/84, pulse 87, temperature 97.8 F (36.6 C), temperature source Oral, resp. rate 10, SpO2 99%.  PHYSICAL EXAMINATION:  Physical Exam  GENERAL:  78 y.o.-year-old Caucasian female patient lying in the bed with no acute distress.  EYES: Pupils equal, round, reactive to light and accommodation. No scleral icterus. Extraocular muscles intact.  HEENT: Head atraumatic, normocephalic. Oropharynx and nasopharynx clear.  NECK:  Supple, no jugular venous distention. No thyroid  enlargement, no tenderness.  LUNGS: Normal breath sounds bilaterally, no wheezing,  rales,rhonchi or crepitation. No use of accessory muscles of respiration.  CARDIOVASCULAR: Regular rate and rhythm, S1, S2 normal. No murmurs, rubs, or gallops.  ABDOMEN: Soft, nondistended, nontender. Bowel sounds present. No organomegaly or mass.  EXTREMITIES: No pedal edema, cyanosis, or clubbing.   NEUROLOGIC: Cranial nerves II through XII are intact. Muscle strength 5/5 in all extremities. Sensation intact. Gait not checked.  PSYCHIATRIC: The patient is alert and oriented x 3.  Normal affect and good eye contact. SKIN: No obvious rash, lesion, or ulcer.   LABORATORY PANEL:   CBC Recent Labs  Lab 06/24/24 0533  WBC 4.7  HGB 13.0  HCT 41.5  PLT 148*   ------------------------------------------------------------------------------------------------------------------  Chemistries  Recent Labs  Lab 06/21/24 0905 06/23/24 2128 06/24/24 0533  NA 139   < > 141  K 4.2   < > 3.5  CL 103   < > 110  CO2 27   < > 24  GLUCOSE 96   < > 86  BUN 22   < > 19  CREATININE 0.72   < > 0.55  CALCIUM  9.5   < > 8.6*  MG  --   --  1.9  AST 28  --   --   ALT 16  --   --   ALKPHOS 92  --   --   BILITOT 1.5*  --   --    < > = values in this interval not displayed.   ------------------------------------------------------------------------------------------------------------------  Cardiac Enzymes No results for input(s): TROPONINI in the last 168 hours. ------------------------------------------------------------------------------------------------------------------  RADIOLOGY:  DG Chest Portable 1 View Result Date: 06/23/2024 CLINICAL DATA:  Palpitations. EXAM: PORTABLE CHEST 1 VIEW COMPARISON:  June 19, 2024 FINDINGS: Multiple sternal wires are present. The heart size and mediastinal contours are within normal limits. There is moderate severity calcification of the aortic arch. 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 The lungs are hyperinflated. No acute infiltrate, pleural effusion or  pneumothorax is identified. The visualized skeletal structures are unremarkable. IMPRESSION: 1. 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 2. No acute or active cardiopulmonary disease. Electronically Signed   By: Suzen Dials M.D.   On: 06/23/2024 21:54      IMPRESSION AND PLAN:  Assessment and Plan: * PSVT (paroxysmal supraventricular tachycardia) - The patient will be admitted to an observation progressive unit bed. - Will continue hydration with IV normal saline. - Will continue beta-blocker therapy with Lopressor  as well as Tikosyn . - Will optimize electrolytes.  Paroxysmal atrial fibrillation (HCC) - Will continue Eliquis  and beta-blocker therapy as mentioned above.  Anxiety and depression - Will continue BuSpar  and Klonopin .  Dyslipidemia - Continue statin therapy.   DVT prophylaxis: Eliquis . Advanced Care Planning:  Code Status: full code. Family Communication:  The plan of care was discussed in details with the patient (and family). I answered all questions. The patient agreed to proceed with the above mentioned plan. Further management will depend upon hospital course. Disposition Plan: Back to previous home environment Consults called: Cardiology. All the records are reviewed and case discussed with ED provider.  Status is: Observation  I certify that at the time of admission, it is my clinical judgment that the patient will require hospital care extending less than 2 midnights.                            Dispo: The patient is from: Home              Anticipated d/c is to: Home              Patient currently is not medically stable to d/c.              Difficult to place patient: No  Madison DELENA Peaches M.D on 06/24/2024 at 6:34 AM  [facility]   From 7 PM-7 AM, contact night-coverage [email or web address]  CC: Primary care physician; Auston Reyes BIRCH, MD

## 2024-06-24 ENCOUNTER — Encounter: Payer: Self-pay | Admitting: Family Medicine

## 2024-06-24 DIAGNOSIS — E785 Hyperlipidemia, unspecified: Secondary | ICD-10-CM | POA: Diagnosis present

## 2024-06-24 DIAGNOSIS — Z87891 Personal history of nicotine dependence: Secondary | ICD-10-CM | POA: Diagnosis not present

## 2024-06-24 DIAGNOSIS — F419 Anxiety disorder, unspecified: Secondary | ICD-10-CM | POA: Diagnosis present

## 2024-06-24 DIAGNOSIS — Z803 Family history of malignant neoplasm of breast: Secondary | ICD-10-CM | POA: Diagnosis not present

## 2024-06-24 DIAGNOSIS — Z952 Presence of prosthetic heart valve: Secondary | ICD-10-CM | POA: Diagnosis not present

## 2024-06-24 DIAGNOSIS — Z1152 Encounter for screening for COVID-19: Secondary | ICD-10-CM | POA: Diagnosis not present

## 2024-06-24 DIAGNOSIS — E876 Hypokalemia: Secondary | ICD-10-CM | POA: Diagnosis present

## 2024-06-24 DIAGNOSIS — I482 Chronic atrial fibrillation, unspecified: Secondary | ICD-10-CM

## 2024-06-24 DIAGNOSIS — Z90710 Acquired absence of both cervix and uterus: Secondary | ICD-10-CM | POA: Diagnosis not present

## 2024-06-24 DIAGNOSIS — R7989 Other specified abnormal findings of blood chemistry: Secondary | ICD-10-CM

## 2024-06-24 DIAGNOSIS — I4891 Unspecified atrial fibrillation: Principal | ICD-10-CM | POA: Insufficient documentation

## 2024-06-24 DIAGNOSIS — F32A Depression, unspecified: Secondary | ICD-10-CM | POA: Diagnosis present

## 2024-06-24 DIAGNOSIS — I493 Ventricular premature depolarization: Secondary | ICD-10-CM | POA: Diagnosis present

## 2024-06-24 DIAGNOSIS — Z8673 Personal history of transient ischemic attack (TIA), and cerebral infarction without residual deficits: Secondary | ICD-10-CM | POA: Diagnosis not present

## 2024-06-24 DIAGNOSIS — I471 Supraventricular tachycardia, unspecified: Secondary | ICD-10-CM | POA: Diagnosis present

## 2024-06-24 DIAGNOSIS — I4510 Unspecified right bundle-branch block: Secondary | ICD-10-CM | POA: Diagnosis present

## 2024-06-24 DIAGNOSIS — I4819 Other persistent atrial fibrillation: Secondary | ICD-10-CM | POA: Diagnosis present

## 2024-06-24 DIAGNOSIS — Z79899 Other long term (current) drug therapy: Secondary | ICD-10-CM | POA: Diagnosis not present

## 2024-06-24 DIAGNOSIS — I484 Atypical atrial flutter: Secondary | ICD-10-CM | POA: Diagnosis present

## 2024-06-24 DIAGNOSIS — Z7901 Long term (current) use of anticoagulants: Secondary | ICD-10-CM | POA: Diagnosis not present

## 2024-06-24 DIAGNOSIS — I10 Essential (primary) hypertension: Secondary | ICD-10-CM | POA: Diagnosis not present

## 2024-06-24 DIAGNOSIS — Z833 Family history of diabetes mellitus: Secondary | ICD-10-CM | POA: Diagnosis not present

## 2024-06-24 DIAGNOSIS — I480 Paroxysmal atrial fibrillation: Secondary | ICD-10-CM | POA: Diagnosis not present

## 2024-06-24 DIAGNOSIS — I483 Typical atrial flutter: Secondary | ICD-10-CM | POA: Diagnosis not present

## 2024-06-24 LAB — CBC
HCT: 41.5 % (ref 36.0–46.0)
Hemoglobin: 13 g/dL (ref 12.0–15.0)
MCH: 28.4 pg (ref 26.0–34.0)
MCHC: 31.3 g/dL (ref 30.0–36.0)
MCV: 90.8 fL (ref 80.0–100.0)
Platelets: 148 K/uL — ABNORMAL LOW (ref 150–400)
RBC: 4.57 MIL/uL (ref 3.87–5.11)
RDW: 13.3 % (ref 11.5–15.5)
WBC: 4.7 K/uL (ref 4.0–10.5)
nRBC: 0 % (ref 0.0–0.2)

## 2024-06-24 LAB — BASIC METABOLIC PANEL WITH GFR
Anion gap: 7 (ref 5–15)
BUN: 19 mg/dL (ref 8–23)
CO2: 24 mmol/L (ref 22–32)
Calcium: 8.6 mg/dL — ABNORMAL LOW (ref 8.9–10.3)
Chloride: 110 mmol/L (ref 98–111)
Creatinine, Ser: 0.55 mg/dL (ref 0.44–1.00)
GFR, Estimated: >60 mL/min (ref >60)
Glucose, Bld: 86 mg/dL (ref 70–99)
Potassium: 3.5 mmol/L (ref 3.5–5.1)
Sodium: 141 mmol/L (ref 135–145)

## 2024-06-24 LAB — TROPONIN T, HIGH SENSITIVITY: Troponin T High Sensitivity: 24 ng/L — ABNORMAL HIGH (ref 0–19)

## 2024-06-24 LAB — MAGNESIUM: Magnesium: 1.9 mg/dL (ref 1.7–2.4)

## 2024-06-24 MED ORDER — IPRATROPIUM BROMIDE 0.03 % NA SOLN: 1.0000 | Freq: Every day | NASAL | Status: DC | PRN

## 2024-06-24 MED ORDER — METOPROLOL TARTRATE 50 MG PO TABS
50.0000 mg | ORAL_TABLET | Freq: Two times a day (BID) | ORAL | Status: DC
  Administered 2024-06-24 – 2024-06-25 (×4): 50 mg via ORAL
  Filled 2024-06-24: qty 2
  Filled 2024-06-24 (×3): qty 1

## 2024-06-24 MED ORDER — MELATONIN 5 MG PO TABS
2.5000 mg | ORAL_TABLET | Freq: Every evening | ORAL | Status: DC | PRN
  Administered 2024-06-24: 2.5 mg via ORAL
  Filled 2024-06-24 (×2): qty 1

## 2024-06-24 MED ORDER — ROSUVASTATIN CALCIUM 10 MG PO TABS
10.0000 mg | ORAL_TABLET | Freq: Every day | ORAL | Status: DC
  Administered 2024-06-24 – 2024-06-29 (×6): 10 mg via ORAL
  Filled 2024-06-24 (×7): qty 1

## 2024-06-24 MED ORDER — SODIUM CHLORIDE 0.9 % IV SOLN: INTRAVENOUS | Status: AC

## 2024-06-24 MED ORDER — FUROSEMIDE 10 MG/ML IJ SOLN
40.0000 mg | Freq: Once | INTRAMUSCULAR | Status: AC
Start: 2024-06-24 — End: 2024-06-24
  Administered 2024-06-24: 40 mg via INTRAVENOUS
  Filled 2024-06-24: qty 4

## 2024-06-24 MED ORDER — ONDANSETRON HCL 4 MG/2ML IJ SOLN: 4.0000 mg | Freq: Four times a day (QID) | INTRAMUSCULAR | Status: DC | PRN

## 2024-06-24 MED ORDER — ACETAMINOPHEN 325 MG PO TABS: 650.0000 mg | ORAL_TABLET | Freq: Four times a day (QID) | ORAL | Status: DC | PRN

## 2024-06-24 MED ORDER — DOFETILIDE 250 MCG PO CAPS
250.0000 ug | ORAL_CAPSULE | Freq: Two times a day (BID) | ORAL | Status: DC
  Filled 2024-06-24: qty 1

## 2024-06-24 MED ORDER — APIXABAN 5 MG PO TABS
5.0000 mg | ORAL_TABLET | Freq: Two times a day (BID) | ORAL | Status: DC
  Administered 2024-06-24 – 2024-06-29 (×11): 5 mg via ORAL
  Filled 2024-06-24 (×11): qty 1

## 2024-06-24 MED ORDER — AMIODARONE LOAD VIA INFUSION
150.0000 mg | Freq: Once | INTRAVENOUS | Status: AC
  Administered 2024-06-24: 150 mg via INTRAVENOUS
  Filled 2024-06-24: qty 83.34

## 2024-06-24 MED ORDER — AMIODARONE HCL IN DEXTROSE 360-4.14 MG/200ML-% IV SOLN
30.0000 mg/h | INTRAVENOUS | Status: DC
  Administered 2024-06-24 – 2024-06-26 (×4): 30 mg/h via INTRAVENOUS
  Filled 2024-06-24 (×3): qty 200

## 2024-06-24 MED ORDER — AMIODARONE HCL IN DEXTROSE 360-4.14 MG/200ML-% IV SOLN
60.0000 mg/h | INTRAVENOUS | Status: AC
  Administered 2024-06-24 (×2): 60 mg/h via INTRAVENOUS
  Filled 2024-06-24 (×2): qty 200

## 2024-06-24 MED ORDER — MAGNESIUM HYDROXIDE 400 MG/5ML PO SUSP: 30.0000 mL | Freq: Every day | ORAL | Status: DC | PRN

## 2024-06-24 MED ORDER — TRAZODONE HCL 50 MG PO TABS: 25.0000 mg | ORAL_TABLET | Freq: Every evening | ORAL | Status: DC | PRN

## 2024-06-24 MED ORDER — ORAL CARE MOUTH RINSE: 15.0000 mL | OROMUCOSAL | Status: DC | PRN

## 2024-06-24 MED ORDER — ONDANSETRON HCL 4 MG PO TABS: 4.0000 mg | ORAL_TABLET | Freq: Four times a day (QID) | ORAL | Status: DC | PRN

## 2024-06-24 MED ORDER — ACETAMINOPHEN 650 MG RE SUPP: 650.0000 mg | Freq: Four times a day (QID) | RECTAL | Status: DC | PRN

## 2024-06-24 MED ORDER — CLONAZEPAM 0.5 MG PO TABS
0.5000 mg | ORAL_TABLET | Freq: Every day | ORAL | Status: DC
  Filled 2024-06-24 (×4): qty 1

## 2024-06-24 MED ORDER — POTASSIUM CHLORIDE CRYS ER 10 MEQ PO TBCR
10.0000 meq | EXTENDED_RELEASE_TABLET | Freq: Every day | ORAL | Status: DC
  Administered 2024-06-24 – 2024-06-26 (×3): 10 meq via ORAL
  Filled 2024-06-24 (×3): qty 1

## 2024-06-24 MED ORDER — BUSPIRONE HCL 5 MG PO TABS
10.0000 mg | ORAL_TABLET | Freq: Two times a day (BID) | ORAL | Status: DC
  Filled 2024-06-24: qty 2

## 2024-06-24 NOTE — ED Notes (Signed)
 At this time, this EDT helped this pt to the [facility]. Pt was ambulatory with minimal to no assistance. Pt was able to get back into bed herself and has no other requests at this time. Call light within reach.

## 2024-06-24 NOTE — Assessment & Plan Note (Addendum)
 Plan to continue statin therapy

## 2024-06-24 NOTE — Progress Notes (Addendum)
 PROGRESS NOTE    SHAYLAH MCGHIE  FMW:[id number] DOB: 07-04-1946 DOA: 06/23/2024 PCP: Auston Reyes BIRCH, MD   Assessment & Plan:   Principal Problem:   PSVT (paroxysmal supraventricular tachycardia) Active Problems:   Paroxysmal atrial fibrillation (HCC)   Dyslipidemia   Anxiety and depression  Assessment and Plan:  Chronic a.fib/flutter: as per cardio. W/ RVR. Poorly controlled w/ hx of multiple cardioversions and ablations as per pt. D/c tikosyn  and start IV amio drip as per cardio. Continue on tele. Continue on eliquis , metoprolol     Depression: severity unknown. Pt's daughter requested for the pt to stop taking bupirone for now  Anxiety: severity unknown. Continue on home dose of klonopin     HLD: continue on statin      DVT prophylaxis: eliquis  Code Status: full  Family Communication: discussed pt's care w/ pt's family at bedside and answered their questions Disposition Plan: likely d/c back home  Level of care: Progressive  Status is: Observation The patient remains OBS appropriate and will d/c before 2 midnights.    Consultants:  Cardio   Procedures:   Antimicrobials:   Subjective: Pt c/o tachycardia   Objective: Vitals:   06/24/24 0300 06/24/24 0500 06/24/24 0600 06/24/24 0815  BP: 104/78 117/78 110/84 110/74  Pulse: 86 85 87 89  Resp: 11 13 10  (!) 21  Temp: 98.1 F (36.7 C) 97.9 F (36.6 C) 97.8 F (36.6 C)   TempSrc: Oral Oral Oral   SpO2: 98% 94% 99% 100%   No intake or output data in the 24 hours ending 06/24/24 0957 There were no vitals filed for this visit.  Examination:  General exam: Appears calm and comfortable  Respiratory system: Clear to auscultation. Respiratory effort normal. Cardiovascular system: irregularly irregular. No rubs, gallops or clicks. Gastrointestinal system: Abdomen is nondistended, soft and nontender. Normal bowel sounds heard. Central nervous system: Alert and oriented. Moves all extremities Psychiatry:  Judgement and insight appear normal. Flat mood and affect     Data Reviewed: I have personally reviewed following labs and imaging studies  CBC: Recent Labs  Lab 06/19/24 0956 06/21/24 0905 06/23/24 2128 06/24/24 0533  WBC 5.6 5.4 6.6 4.7  NEUTROABS 3.4 3.3  --   --   HGB 15.6* 15.9* 15.0 13.0  HCT 47.8* 49.8* 47.0* 41.5  MCV 88.4 87.4 90.7 90.8  PLT 170 182 183 148*   Basic Metabolic Panel: Recent Labs  Lab 06/19/24 0956 06/21/24 0905 06/23/24 2128 06/24/24 0533  NA 140 139 144 141  K 4.1 4.2 3.6 3.5  CL 103 103 107 110  CO2 27 27 28 24   GLUCOSE 118* 96 75 86  BUN 16 22 24* 19  CREATININE 0.78 0.72 0.73 0.55  CALCIUM  9.6 9.5 9.6 8.6*  MG 2.0  --   --  1.9   GFR: Estimated Creatinine Clearance: 57.4 mL/min (by C-G formula based on SCr of 0.55 mg/dL). Liver Function Tests: Recent Labs  Lab 06/21/24 0905  AST 28  ALT 16  ALKPHOS 92  BILITOT 1.5*  PROT 6.8  ALBUMIN 4.1   No results for input(s): LIPASE, AMYLASE in the last 168 hours. No results for input(s): AMMONIA in the last 168 hours. Coagulation Profile: No results for input(s): INR, PROTIME in the last 168 hours. Cardiac Enzymes: No results for input(s): CKTOTAL, CKMB, CKMBINDEX, TROPONINI in the last 168 hours. BNP (last 3 results) Recent Labs    06/19/24 0956 06/23/24 2128  PROBNP 975.0* 1,078.0*   HbA1C: No results for input(s):  HGBA1C in the last 72 hours. CBG: No results for input(s): GLUCAP in the last 168 hours. Lipid Profile: No results for input(s): CHOL, HDL, LDLCALC, TRIG, CHOLHDL, LDLDIRECT in the last 72 hours. Thyroid  Function Tests: No results for input(s): TSH, T4TOTAL, FREET4, T3FREE, THYROIDAB in the last 72 hours. Anemia Panel: No results for input(s): VITAMINB12, FOLATE, FERRITIN, TIBC, IRON, RETICCTPCT in the last 72 hours. Sepsis Labs: No results for input(s): PROCALCITON, LATICACIDVEN in the last 168  hours.  No results found for this or any previous visit (from the past 240 hours).       Radiology Studies: DG Chest Portable 1 View Result Date: 06/23/2024 CLINICAL DATA:  Palpitations. EXAM: PORTABLE CHEST 1 VIEW COMPARISON:  June 19, 2024 FINDINGS: Multiple sternal wires are present. The heart size and mediastinal contours are within normal limits. There is moderate severity calcification of the aortic arch. 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 The lungs are hyperinflated. No acute infiltrate, pleural effusion or pneumothorax is identified. The visualized skeletal structures are unremarkable. IMPRESSION: 1. 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 2. No acute or active cardiopulmonary disease. Electronically Signed   By: Suzen Dials M.D.   On: 06/23/2024 21:54        Scheduled Meds:  apixaban   5 mg Oral BID   busPIRone   10 mg Oral BID   clonazePAM   0.5 mg Oral QHS   furosemide   40 mg Intravenous Once   metoprolol  tartrate  50 mg Oral BID   potassium chloride   10 mEq Oral Daily   rosuvastatin   10 mg Oral Daily   Continuous Infusions:  sodium chloride  100 mL/hr at 06/24/24 0332     LOS: 0 days      Anthony CHRISTELLA Pouch, MD [facility] Pager [phone]  If 7PM-7AM, please contact night-coverage [email or web address] 06/24/2024, 9:57 AM

## 2024-06-24 NOTE — ED Notes (Signed)
 Pt ambulatory to restroom

## 2024-06-24 NOTE — Plan of Care (Signed)
  Problem: Education: Goal: Knowledge of General Education information will improve Description: Including pain rating scale, medication(s)/side effects and non-pharmacologic comfort measures Outcome: Progressing   Problem: Health Behavior/Discharge Planning: Goal: Ability to manage health-related needs will improve Outcome: Progressing   Problem: Clinical Measurements: Goal: Ability to maintain clinical measurements within normal limits will improve Outcome: Progressing Goal: Will remain free from infection Outcome: Progressing Goal: Cardiovascular complication will be avoided Outcome: Progressing   Problem: Activity: Goal: Risk for activity intolerance will decrease Outcome: Progressing   Problem: Nutrition: Goal: Adequate nutrition will be maintained Outcome: Progressing   Problem: Elimination: Goal: Will not experience complications related to urinary retention Outcome: Progressing   Problem: Pain Managment: Goal: General experience of comfort will improve and/or be controlled Outcome: Progressing   Problem: Safety: Goal: Ability to remain free from injury will improve Outcome: Progressing   Problem: Skin Integrity: Goal: Risk for impaired skin integrity will decrease Outcome: Progressing

## 2024-06-24 NOTE — Assessment & Plan Note (Addendum)
 Patient remained on sinus rhythm during her hospitalization.  She received amiodarone  IV load with good toleration, at home she will continue with oral amiodarone  load 200 mg bid for one week and then continue with once daily for maintenance.  Metoprolol  changed to 25 mg as needed for tachycardia (heart rate 120 bpm) and palpitations.  Take up to 50 mg if needed.   Elevated high sensitive troponin due to tachycardia, acute coronary syndrome has been ruled out

## 2024-06-24 NOTE — Assessment & Plan Note (Signed)
 Dofetilide  was discontinued and patient has been placed on amiodarone  with good toleration.  At the time of discharge her telemetry shows sinus rhythm with rate of 60 bpm.   Plan to continue amiodarone  for rate and rhythm control Continue anticoagulation with apixaban .  Follow up as outpatient.

## 2024-06-24 NOTE — Assessment & Plan Note (Addendum)
 Patient not longer taking clonazepam  or buspirone .

## 2024-06-24 NOTE — ED Notes (Signed)
 This tech along with pts daughter assisted the pt to the bathroom, pt tolerated well with only complaint being feeling unsteady

## 2024-06-24 NOTE — ED Notes (Signed)
 At this time, this EDT helped this pt ambulate to the [facility]. Pt was ambulatory and needed little to no assistance. This EDT provided wipes, & helped pt back to bed. Pt has no other requests at this time, call light within reach.

## 2024-06-24 NOTE — Consult Note (Addendum)
 Cardiology Consultation   Patient ID: Colleen Williamson MRN: [id number]; DOB: 1946-03-19  Admit date: 06/23/2024 Date of Consult: 06/24/2024  PCP:  Auston Reyes BIRCH, MD   [facility] Providers Cardiologist:  Evalene Lunger, MD  Electrophysiologist:  OLE ONEIDA HOLTS, MD   Patient Profile: Colleen Williamson is a 78 y.o. 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 Elsie.  History of Present Illness: [name] is followed by EP. She has undergone multiple cardioversions in the past. She underwent Afib ablation in November 2024 by Dr. Holts. She presented in April 2025 with narrow complex tachycardia thought to be SVT, but likely Aflutter. She underwent TEE guided cardioversion. She was started on dofetalide and has maintained in NSR for the most part.   She was admitted 06/19/34 with narrow complex tachycardia treated with 1 dose of adenosine , which did not convert her. Patient highly symptomatic in Afib. EKG concerning for prolonged Qtc, but repeat was stable. Propranolol  was switched to metoprolol  and EP saw the patient. After a long discussion it was recommended to continue Tikosyn  and pursue cardioversion 06/21/24, which was successful.  The patient presented back to the ER 06/23/24 with palpitations and weakness. Shortly after the cardioversion she felt weak and tired. She felt her heart fluttering and suspected she was back in Afib. She denies chest pain or SOB.   In the ER BP 133/101, HR 166bpm. Labs showed HS trop 24>24, K 3.6, Hgb 15, WBC 6.6. pro BNP 1078 EKG showed narrow complex tachycardia with a rate of 165bpm, iVCD. CXR was non-acute. The patient was given IV lopressor  5 and admitted for further work-up.    Past Medical History:  Diagnosis Date   Abnormal mammogram, unspecified 2014   Anxiety    Atypical  atrial flutter (HCC)    a. 12/2014 s/p RFCA.   History of cardiac cath    a. 03/2014 Cath [facility]): Nl cors.   Mitral valve disorder    a. 03/2014 s/p MV repair [facility]); b. 06/2016 Echo: EF 55-60%, no rwma, Nl fxn'ing MV w/o stenosis. Mildly dil LA/RA.   PAF (paroxysmal atrial fibrillation) (HCC)    a. 06/2016 s/p DCCV; b.  CHA2DS2VASc = 4-->Eliquis . Rhythm mgmt w/ amio (prev intol to flecainide ).   PSVT (paroxysmal supraventricular tachycardia)    Stroke (HCC)    a. 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    Past Surgical History:  Procedure Laterality Date   ABDOMINAL HYSTERECTOMY  2004   ATRIAL FIBRILLATION ABLATION N/A 06/10/2023   Procedure: ATRIAL FIBRILLATION ABLATION;  Surgeon: Holts Ole ONEIDA, MD;  Location: [facility] INVASIVE CV LAB;  Service: Cardiovascular;  Laterality: N/A;   BREAST CYST ASPIRATION  15 YRS AGO   CARDIOVERSION N/A 09/21/2014   Procedure: CARDIOVERSION;  Surgeon: Maude JAYSON Emmer, MD;  Location: [facility] ENDOSCOPY;  Service: Cardiovascular;  Laterality: N/A;   CARDIOVERSION N/A 02/26/2023   Procedure: CARDIOVERSION;  Surgeon: Lunger Evalene PARAS, MD;  Location: [facility] ORS;  Service: Cardiovascular;  Laterality: N/A;   CARDIOVERSION N/A 11/16/2023   Procedure: CARDIOVERSION;  Surgeon: Shlomo Wilbert SAUNDERS, MD;  Location: [facility] INVASIVE CV LAB;  Service: Cardiovascular;  Laterality: N/A;   CARDIOVERSION N/A 06/21/2024   Procedure: CARDIOVERSION;  Surgeon: Lunger Evalene PARAS, MD;  Location: [facility] ORS;  Service: Cardiovascular;  Laterality: N/A;   COLONOSCOPY WITH PROPOFOL  N/A 05/15/2020  Procedure: COLONOSCOPY WITH PROPOFOL ;  Surgeon: Toledo, Ladell POUR, MD;  Location: [facility] ENDOSCOPY;  Service: Endoscopy;  Laterality: N/A;   ELECTROPHYSIOLOGIC STUDY N/A 11/28/2014   Procedure: Cardioversion;  Surgeon: Vinie DELENA Jude, MD;  Location: [facility] INVASIVE CV LAB;  Service: Cardiovascular;  Laterality: N/A;   ELECTROPHYSIOLOGIC STUDY N/A 12/27/2014   Procedure: A-Flutter;  Surgeon: Danelle LELON Birmingham, MD;   Location: [facility] INVASIVE CV LAB;  Service: Cardiovascular;  Laterality: N/A;   ELECTROPHYSIOLOGIC STUDY N/A 11/28/2014   Procedure: Cardioversion;  Surgeon: Vinie DELENA Jude, MD;  Location: [facility] ORS;  Service: Cardiovascular;  Laterality: N/A;  Have discussed with Dr. Debby   ELECTROPHYSIOLOGIC STUDY N/A 07/13/2016   Procedure: Cardioversion;  Surgeon: Deatrice DELENA Cage, MD;  Location: [facility] ORS;  Service: Cardiovascular;  Laterality: N/A;   EYE SURGERY  2007   MITRAL VALVE REPAIR  05/2014   RADIOLOGY WITH ANESTHESIA N/A 07/03/2014   Procedure: RADIOLOGY WITH ANESTHESIA;  Surgeon: Thyra POUR Nash, MD;  Location: [facility] OR;  Service: Radiology;  Laterality: N/A;   TRANSESOPHAGEAL ECHOCARDIOGRAM (CATH LAB) N/A 11/16/2023   Procedure: TRANSESOPHAGEAL ECHOCARDIOGRAM;  Surgeon: Shlomo Wilbert SAUNDERS, MD;  Location: [facility] INVASIVE CV LAB;  Service: Cardiovascular;  Laterality: N/A;     Home Medications:  Prior to Admission medications   Medication Sig Start Date End Date Taking? Authorizing Provider  apixaban  (ELIQUIS ) 5 MG TABS tablet Take 1 tablet (5 mg total) by mouth 2 (two) times daily. 12/01/21  Yes Gollan, Timothy J, MD  busPIRone  (BUSPAR ) 10 MG tablet Take 10 mg by mouth 2 (two) times daily. 06/05/24 06/05/25 Yes [provider]  dofetilide  (TIKOSYN ) 250 MCG capsule Take 1 capsule (250 mcg total) by mouth 2 (two) times daily. 03/08/24  Yes Wittenborn, Deborah, NP  ipratropium (ATROVENT) 0.03 % nasal spray Place 1 spray into both nostrils daily as needed (allergies/congestion.). 09/04/20 06/24/24 Yes [provider]  metoprolol  tartrate (LOPRESSOR ) 50 MG tablet Take 1 tablet (50 mg total) by mouth 2 (two) times daily. 06/21/24 07/21/24 Yes Dezii, Alexandra, DO  potassium chloride  (KLOR-CON  M) 10 MEQ tablet Take 1 tablet (10 mEq total) by mouth daily. 06/09/24  Yes Cindie Ole DASEN, MD  rosuvastatin  (CRESTOR ) 10 MG tablet Take 10 mg by mouth in the morning. 03/06/22  Yes [provider]   amoxicillin (AMOXIL) 500 MG capsule Take 2,000 mg by mouth See admin instructions. Take 4 capsules (2000 mg) by mouth 1 hour prior to dental appointments. Patient not taking: Reported on 06/24/2024    [provider]  clonazePAM  (KLONOPIN ) 0.5 MG tablet Take 0.5 mg by mouth. 12/15/23 03/29/24  [provider]    Scheduled Meds:  apixaban   5 mg Oral BID   busPIRone   10 mg Oral BID   clonazePAM   0.5 mg Oral QHS   dofetilide   250 mcg Oral BID   metoprolol  tartrate  50 mg Oral BID   potassium chloride   10 mEq Oral Daily   rosuvastatin   10 mg Oral Daily   Continuous Infusions:  sodium chloride  100 mL/hr at 06/24/24 0332   PRN Meds: acetaminophen  **OR** acetaminophen , ipratropium, magnesium  hydroxide, ondansetron  **OR** ondansetron  (ZOFRAN ) IV, traZODone  Allergies:    Allergies  Allergen Reactions   Flecainide      Funny taste; hairloss   Sulfa Antibiotics Nausea Only    Social History:   Social History   Socioeconomic History   Marital status: Widowed    Spouse name: Not on file   Number of children: 3   Years  of education: 12+   Highest education level: Not on file  Occupational History   Not on file  Tobacco Use   Smoking status: Former   Smokeless tobacco: Never   Tobacco comments:    quit smoking in her 20's  Vaping Use   Vaping status: Former  Substance and Sexual Activity   Alcohol use: No    Alcohol/week: 0.0 standard drinks of alcohol   Drug use: No   Sexual activity: Not on file  Other Topics Concern   Not on file  Social History Narrative   Patient is widowed with 3 children.   Patient is right handed.   Patient has hs education plus 1-2 yrs of college.   Patient drinks 1 cup daily.   Social Drivers of Corporate Investment Banker Strain: Low Risk  (09/22/2023)   Received from [facility]   Overall Financial Resource Strain (CARDIA)    Difficulty of Paying Living Expenses: Not hard at all  Food Insecurity: No Food  Insecurity (06/20/2024)   Hunger Vital Sign    Worried About Running Out of Food in the Last Year: Never true    Ran Out of Food in the Last Year: Never true  Transportation Needs: No Transportation Needs (06/19/2024)   PRAPARE - Administrator, Civil Service (Medical): No    Lack of Transportation (Non-Medical): No  Physical Activity: Not on file  Stress: Not on file  Social Connections: Moderately Isolated (06/19/2024)   Social Connection and Isolation Panel    Frequency of Communication with Friends and Family: More than three times a week    Frequency of Social Gatherings with Friends and Family: More than three times a week    Attends Religious Services: More than 4 times per year    Active Member of [employer] or Organizations: No    Attends Banker Meetings: Never    Marital Status: Widowed  Intimate Partner Violence: Not At Risk (06/19/2024)   Humiliation, Afraid, Rape, and Kick questionnaire    Fear of Current or Ex-Partner: No    Emotionally Abused: No    Physically Abused: No    Sexually Abused: No    Family History:    Family History  Problem Relation Age of Onset   Cancer Father    Cancer Other        BOTH SIDES OF THE FAMILY   Diabetes Other        MATERNAL SIDE OF THE FAMILY   Breast cancer Maternal Aunt      ROS:  Please see the history of present illness.   All other ROS reviewed and negative.     Physical Exam/Data: Vitals:   06/24/24 0100 06/24/24 0300 06/24/24 0500 06/24/24 0600  BP: 110/82 104/78 117/78 110/84  Pulse: 84 86 85 87  Resp: 15 11 13 10   Temp: 98.4 F (36.9 C) 98.1 F (36.7 C) 97.9 F (36.6 C) 97.8 F (36.6 C)  TempSrc: Oral Oral Oral Oral  SpO2: 100% 98% 94% 99%   No intake or output data in the 24 hours ending 06/24/24 0601    06/19/2024    9:55 AM 03/29/2024    8:53 AM 03/08/2024    9:50 AM  Last 3 Weights  Weight (lbs) 134 lb 139 lb 139 lb 9.6 oz  Weight (kg) 60.782 kg 63.05 kg 63.322 kg     There  is no height or weight on file to calculate BMI.  General:  Well nourished, well developed, in no acute distress HEENT: normal Neck: no JVD Vascular: No carotid bruits; Distal pulses 2+ bilaterally Cardiac:  normal S1, S2; Reg IRreg; no murmur  Lungs:  clear to auscultation bilaterally, no wheezing, rhonchi or rales  Abd: soft, nontender, no hepatomegaly  Ext: mild lower extremity edema Musculoskeletal:  No deformities, BUE and BLE strength normal and equal Skin: warm and dry  Neuro:  CNs 2-12 intact, no focal abnormalities noted Psych:  Normal affect   EKG:  The EKG was personally reviewed and demonstrates:  Wide complex tachycardia, suspected Aflutter, 165bpm, LAD, Qtc Telemetry:  Telemetry was personally reviewed and demonstrates:  Aflutter HR 80s  Relevant CV Studies:  Echo 05/2024 1. Left ventricular ejection fraction, by estimation, is 55 to 60%. The  left ventricle has normal function. The left ventricle has no regional  wall motion abnormalities. Left ventricular diastolic function could not  be evaluated.   2. Right ventricular systolic function is normal. The right ventricular  size is normal.   3. The mitral valve has been repaired/replaced. Mild mitral valve  regurgitation. No evidence of mitral stenosis.   4. The aortic valve is tricuspid. Aortic valve regurgitation is mild to  moderate. No aortic stenosis is present.   5. The inferior vena cava is normal in size with greater than 50%  respiratory variability, suggesting right atrial pressure of 3 mmHg.   Laboratory Data: High Sensitivity Troponin:  No results for input(s): TROPONINIHS in the last 720 hours.   Chemistry Recent Labs  Lab 06/19/24 0956 06/21/24 0905 06/23/24 2128 06/24/24 0533  NA 140 139 144 141  K 4.1 4.2 3.6 3.5  CL 103 103 107 110  CO2 27 27 28 24   GLUCOSE 118* 96 75 86  BUN 16 22 24* 19  CREATININE 0.78 0.72 0.73 0.55  CALCIUM  9.6 9.5 9.6 8.6*  MG 2.0  --   --  1.9  GFRNONAA  >60 >60 >60 >60  ANIONGAP 11 9 10 7     Recent Labs  Lab 06/21/24 0905  PROT 6.8  ALBUMIN 4.1  AST 28  ALT 16  ALKPHOS 92  BILITOT 1.5*   Lipids No results for input(s): CHOL, TRIG, HDL, LABVLDL, LDLCALC, CHOLHDL in the last 168 hours.  Hematology Recent Labs  Lab 06/21/24 0905 06/23/24 2128 06/24/24 0533  WBC 5.4 6.6 4.7  RBC 5.70* 5.18* 4.57  HGB 15.9* 15.0 13.0  HCT 49.8* 47.0* 41.5  MCV 87.4 90.7 90.8  MCH 27.9 29.0 28.4  MCHC 31.9 31.9 31.3  RDW 13.2 13.4 13.3  PLT 182 183 148*   Thyroid  No results for input(s): TSH, FREET4 in the last 168 hours.  BNP Recent Labs  Lab 06/19/24 0956 06/23/24 2128  PROBNP 975.0* 1,078.0*    DDimer  Recent Labs  Lab 06/20/24 0855  DDIMER <0.27    Radiology/Studies:  DG Chest Portable 1 View Result Date: 06/23/2024 CLINICAL DATA:  Palpitations. EXAM: PORTABLE CHEST 1 VIEW COMPARISON:  June 19, 2024 FINDINGS: Multiple sternal wires are present. The heart size and mediastinal contours are within normal limits. There is moderate severity calcification of the aortic arch. 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 The lungs are hyperinflated. No acute infiltrate, pleural effusion or pneumothorax is identified. The visualized skeletal structures are unremarkable. IMPRESSION: 1. 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 2. No acute or active cardiopulmonary disease. Electronically Signed   By: Suzen Dials M.D.   On: 06/23/2024 21:54   ECHOCARDIOGRAM COMPLETE  Result Date: 06/20/2024    ECHOCARDIOGRAM REPORT   Patient Name:   MAYBELL MISENHEIMER Aurich Date of Exam: 06/20/2024 Medical Rec #:  [id number]        Height:       68.0 in Accession #:    [id number]       Weight:       134.0 lb Date of Birth:  04/06/1946        BSA:          1.724 m Patient Age:    78 years         BP:           106/69 mmHg Patient Gender: F                HR:           95 bpm. Exam Location:  [facility] Procedure: 2D Echo, Cardiac Doppler  and Color Doppler (Both Spectral and Color            Flow Doppler were utilized during procedure). Indications:     Atrial Fibrillation I48.91  History:         Patient has prior history of Echocardiogram examinations, most                  recent 11/16/2023. Prior Cardiac Surgery, Mitral Valve Disease                  and Mitral Valve Replacement; Arrythmias:Atrial Fibrillation.  Sonographer:     Ashley McNeely-Sloane Referring Phys:  [id number] FLYJFFJI A ARIDA Diagnosing Phys: Lonni Hanson MD IMPRESSIONS  1. Left ventricular ejection fraction, by estimation, is 55 to 60%. The left ventricle has normal function. The left ventricle has no regional wall motion abnormalities. Left ventricular diastolic function could not be evaluated.  2. Right ventricular systolic function is normal. The right ventricular size is normal.  3. The mitral valve has been repaired/replaced. Mild mitral valve regurgitation. No evidence of mitral stenosis.  4. The aortic valve is tricuspid. Aortic valve regurgitation is mild to moderate. No aortic stenosis is present.  5. The inferior vena cava is normal in size with greater than 50% respiratory variability, suggesting right atrial pressure of 3 mmHg. FINDINGS  Left Ventricle: Left ventricular ejection fraction, by estimation, is 55 to 60%. The left ventricle has normal function. The left ventricle has no regional wall motion abnormalities. The left ventricular internal cavity size was normal in size. There is  borderline left ventricular hypertrophy. Left ventricular diastolic function could not be evaluated due to mitral valve repair. Left ventricular diastolic function could not be evaluated. Right Ventricle: The right ventricular size is normal. No increase in right ventricular wall thickness. Right ventricular systolic function is normal. Left Atrium: Left atrial size was normal in size. Right Atrium: Right atrial size was normal in size. Pericardium: There is no evidence of pericardial  effusion. Mitral Valve: The mitral valve has been repaired/replaced. There is moderate thickening of the mitral valve leaflet(s). Mild mitral valve regurgitation. There is a prosthetic annuloplasty ring present in the mitral position. No evidence of mitral valve stenosis. MV peak gradient, 3.0 mmHg. The mean mitral valve gradient is 2.0 mmHg. Tricuspid Valve: The tricuspid valve is normal in structure. Tricuspid valve regurgitation is mild. Aortic Valve: The aortic valve is tricuspid. Aortic valve regurgitation is mild to moderate. Aortic regurgitation PHT measures 293 msec. No aortic stenosis is present. Aortic valve mean gradient measures 1.0 mmHg. Aortic valve peak gradient measures  2.2 mmHg. Aortic valve area, by VTI measures 2.76 cm. Pulmonic Valve: The pulmonic valve was grossly normal. Pulmonic valve regurgitation is mild. No evidence of pulmonic stenosis. Aorta: The aortic root and ascending aorta are structurally normal, with no evidence of dilitation. Pulmonary Artery: The pulmonary artery is of normal size. Venous: The inferior vena cava is normal in size with greater than 50% respiratory variability, suggesting right atrial pressure of 3 mmHg. IAS/Shunts: The interatrial septum was not well visualized.  LEFT VENTRICLE PLAX 2D LVIDd:         3.70 cm     Diastology LVIDs:         2.70 cm     LV e' medial:    4.24 cm/s LV PW:         1.10 cm     LV E/e' medial:  23.0 LV IVS:        0.90 cm     LV e' lateral:   12.20 cm/s LVOT diam:     2.20 cm     LV E/e' lateral: 8.0 LV SV:         30 LV SV Index:   17 LVOT Area:     3.80 cm LV IVRT:       67 msec  LV Volumes (MOD) LV vol d, MOD A2C: 39.2 ml LV vol d, MOD A4C: 27.0 ml LV vol s, MOD A2C: 23.1 ml LV vol s, MOD A4C: 14.9 ml LV SV MOD A2C:     16.1 ml LV SV MOD A4C:     27.0 ml LV SV MOD BP:      13.8 ml RIGHT VENTRICLE            IVC RV Basal diam:  3.60 cm    IVC diam: 1.20 cm RV Mid diam:    3.20 cm RV S prime:     6.15 cm/s  PULMONARY VEINS TAPSE  (M-mode): 0.7 cm     Systolic Velocity: 66.60 cm/s LEFT ATRIUM             Index        RIGHT ATRIUM           Index LA diam:        3.00 cm 1.74 cm/m   RA Area:     13.10 cm LA Vol (A2C):   35.3 ml 20.48 ml/m  RA Volume:   32.30 ml  18.74 ml/m LA Vol (A4C):   22.9 ml 13.28 ml/m LA Biplane Vol: 29.1 ml 16.88 ml/m  AORTIC VALVE AV Area (Vmax):    2.49 cm AV Area (Vmean):   2.37 cm AV Area (VTI):     2.76 cm AV Vmax:           74.30 cm/s AV Vmean:          51.300 cm/s AV VTI:            0.108 m AV Peak Grad:      2.2 mmHg AV Mean Grad:      1.0 mmHg LVOT Vmax:         48.70 cm/s LVOT Vmean:        32.000 cm/s LVOT VTI:          0.078 m LVOT/AV VTI ratio: 0.72 AI PHT:            293 msec  AORTA Ao Root diam: 3.40 cm Ao Asc diam:  3.50 cm MITRAL VALVE MV Area (PHT): 5.97 cm  SHUNTS MV Area VTI:   1.52 cm    Systemic VTI:  0.08 m MV Peak grad:  3.0 mmHg    Systemic Diam: 2.20 cm MV Mean grad:  2.0 mmHg MV Vmax:       0.87 m/s MV Vmean:      62.3 cm/s MV Decel Time: 127 msec MV E velocity: 97.50 cm/s Lonni Hanson MD Electronically signed by Lonni Hanson MD Signature Date/Time: 06/20/2024/1:43:45 PM    Final      Assessment and Plan:  Persistent Afib/fluter - recent repeat DCCV 11/26 on Tikosyn , presenting back to the ER found to be back in rapid afib/flutter - EKG showed likely Aflutter 165bpm,IVCD, Qtc - repeat EKG showed Afib/flutter 128bpm, Qtc - continued on Tikosyn  250mcg BID and Lopressor  50mg  BID - Eliquis  5mg BID - still in aflutter, rates in the 80s. She is feeling better - patient would like to stop Tikosyn  and start amiodarone . Stop Tikosyn  today, washout x 2 day. Patient may want to go home with close EP follow-up, which is reasonable. MD to see. I will give IV lasix  40mg  once due to elevated pBNP and mild LLE  HLD - continue statin therapy  Elevated troponin - HS trop 24>24 - no recent ischemic work-up - ETT 05/2015 was normal - no chest pain reported -  suspect supply demand mismatch - any further work-up can be as OP    For questions or updates, please contact [facility] Please consult [email or web address] for contact info under      Signed, Aury Scollard VEAR Fishman, PA-C  06/24/2024 6:01 AM

## 2024-06-25 DIAGNOSIS — I4891 Unspecified atrial fibrillation: Secondary | ICD-10-CM | POA: Diagnosis not present

## 2024-06-25 DIAGNOSIS — I482 Chronic atrial fibrillation, unspecified: Secondary | ICD-10-CM | POA: Diagnosis not present

## 2024-06-25 LAB — BASIC METABOLIC PANEL WITH GFR
Anion gap: 8 (ref 5–15)
BUN: 16 mg/dL (ref 8–23)
CO2: 26 mmol/L (ref 22–32)
Calcium: 8.7 mg/dL — ABNORMAL LOW (ref 8.9–10.3)
Chloride: 107 mmol/L (ref 98–111)
Creatinine, Ser: 0.6 mg/dL (ref 0.44–1.00)
GFR, Estimated: >60 mL/min (ref >60)
Glucose, Bld: 83 mg/dL (ref 70–99)
Potassium: 3.4 mmol/L — ABNORMAL LOW (ref 3.5–5.1)
Sodium: 142 mmol/L (ref 135–145)

## 2024-06-25 MED ORDER — MELATONIN 5 MG PO TABS
10.0000 mg | ORAL_TABLET | Freq: Every evening | ORAL | Status: DC | PRN
  Administered 2024-06-25 – 2024-06-28 (×4): 10 mg via ORAL
  Filled 2024-06-25 (×4): qty 2

## 2024-06-25 NOTE — Plan of Care (Signed)

## 2024-06-25 NOTE — Progress Notes (Signed)
 PROGRESS NOTE    Colleen Williamson  FMW:[id number] DOB: 07/03/1946 DOA: 06/23/2024 PCP: Auston Reyes BIRCH, MD   Assessment & Plan:   Principal Problem:   PSVT (paroxysmal supraventricular tachycardia) Active Problems:   Paroxysmal atrial fibrillation (HCC)   Dyslipidemia   Anxiety and depression   Atrial fibrillation with RVR (HCC)  Assessment and Plan:  Chronic a.fib/flutter: as per cardio. W/ RVR. Poorly controlled w/ hx of multiple cardioversions and ablations as per pt. D/c tikosyn . Continue on IV amio as per cardio. Continue on eliquis , metoprolol .    Depression: severity unknown. Pt's daughter requested for the pt to stop taking bupirone for now  Anxiety: severity unknown. Continue on home dose of klonopin     HLD: continue on statin      DVT prophylaxis: eliquis  Code Status: full  Family Communication: discussed pt's care w/ pt's daughter, Etha & answered her questions Disposition Plan: likely d/c back home  Level of care: Progressive  Status is: Observation The patient remains OBS appropriate and will d/c before 2 midnights.    Consultants:  Cardio   Procedures:   Antimicrobials:   Subjective: Pt c/o intermittent flushes.  Objective: Vitals:   06/24/24 2003 06/25/24 0001 06/25/24 0317 06/25/24 0815  BP: 126/86 120/78 (!) 141/97 (!) 145/99  Pulse: 82 79 81 81  Resp: 18 17 17 18   Temp: 97.8 F (36.6 C) 97.6 F (36.4 C) 97.6 F (36.4 C) 97.9 F (36.6 C)  TempSrc: Oral     SpO2: 99% 97% 99% 99%  Weight:      Height:        Intake/Output Summary (Last 24 hours) at 06/25/2024 0919 Last data filed at 06/25/2024 0700 Gross per 24 hour  Intake 2005.18 ml  Output --  Net 2005.18 ml   Filed Weights   06/24/24 1814  Weight: 61 kg    Examination:  General exam: Appears comfortable Respiratory system: clear breath sounds b/l  Cardiovascular system: irregularly irregular  Gastrointestinal system: abd is soft, NT, ND & hypoactive bowel  sounds Central nervous system: alert & oriented. Moves all extremities  Psychiatry: Judgement and insight appears normal. Appropriate mood and affect    Data Reviewed: I have personally reviewed following labs and imaging studies  CBC: Recent Labs  Lab 06/19/24 0956 06/21/24 0905 06/23/24 2128 06/24/24 0533  WBC 5.6 5.4 6.6 4.7  NEUTROABS 3.4 3.3  --   --   HGB 15.6* 15.9* 15.0 13.0  HCT 47.8* 49.8* 47.0* 41.5  MCV 88.4 87.4 90.7 90.8  PLT 170 182 183 148*   Basic Metabolic Panel: Recent Labs  Lab 06/19/24 0956 06/21/24 0905 06/23/24 2128 06/24/24 0533 06/25/24 0512  NA 140 139 144 141 142  K 4.1 4.2 3.6 3.5 3.4*  CL 103 103 107 110 107  CO2 27 27 28 24 26   GLUCOSE 118* 96 75 86 83  BUN 16 22 24* 19 16  CREATININE 0.78 0.72 0.73 0.55 0.60  CALCIUM  9.6 9.5 9.6 8.6* 8.7*  MG 2.0  --   --  1.9  --    GFR: Estimated Creatinine Clearance: 57.6 mL/min (by C-G formula based on SCr of 0.6 mg/dL). Liver Function Tests: Recent Labs  Lab 06/21/24 0905  AST 28  ALT 16  ALKPHOS 92  BILITOT 1.5*  PROT 6.8  ALBUMIN 4.1   No results for input(s): LIPASE, AMYLASE in the last 168 hours. No results for input(s): AMMONIA in the last 168 hours. Coagulation Profile: No results for input(s):  INR, PROTIME in the last 168 hours. Cardiac Enzymes: No results for input(s): CKTOTAL, CKMB, CKMBINDEX, TROPONINI in the last 168 hours. BNP (last 3 results) Recent Labs    06/19/24 0956 06/23/24 2128  PROBNP 975.0* 1,078.0*   HbA1C: No results for input(s): HGBA1C in the last 72 hours. CBG: No results for input(s): GLUCAP in the last 168 hours. Lipid Profile: No results for input(s): CHOL, HDL, LDLCALC, TRIG, CHOLHDL, LDLDIRECT in the last 72 hours. Thyroid  Function Tests: No results for input(s): TSH, T4TOTAL, FREET4, T3FREE, THYROIDAB in the last 72 hours. Anemia Panel: No results for input(s): VITAMINB12, FOLATE, FERRITIN,  TIBC, IRON, RETICCTPCT in the last 72 hours. Sepsis Labs: No results for input(s): PROCALCITON, LATICACIDVEN in the last 168 hours.  No results found for this or any previous visit (from the past 240 hours).       Radiology Studies: DG Chest Portable 1 View Result Date: 06/23/2024 CLINICAL DATA:  Palpitations. EXAM: PORTABLE CHEST 1 VIEW COMPARISON:  June 19, 2024 FINDINGS: Multiple sternal wires are present. The heart size and mediastinal contours are within normal limits. There is moderate severity calcification of the aortic arch. 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 The lungs are hyperinflated. No acute infiltrate, pleural effusion or pneumothorax is identified. The visualized skeletal structures are unremarkable. IMPRESSION: 1. 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 2. No acute or active cardiopulmonary disease. Electronically Signed   By: Suzen Dials M.D.   On: 06/23/2024 21:54        Scheduled Meds:  apixaban   5 mg Oral BID   clonazePAM   0.5 mg Oral QHS   metoprolol  tartrate  50 mg Oral BID   potassium chloride   10 mEq Oral Daily   rosuvastatin   10 mg Oral Daily   Continuous Infusions:  amiodarone  30 mg/hr (06/25/24 0700)     LOS: 1 day      Anthony CHRISTELLA Pouch, MD [facility] Pager [phone]  If 7PM-7AM, please contact night-coverage [email or web address] 06/25/2024, 9:19 AM

## 2024-06-25 NOTE — Progress Notes (Addendum)
 RN discussed with Cadence F, PA that patient had run of non-sustained vtach rate 170 just recently. PA present and acknowledged. Patient alert in bed and rate better controlled low 100s and atrial flutter per PA. Patient alert in bed talking with family, denies pain and denies shortness of breath. RN also made PA aware that patient's diastolic BP has been in upper 09d last two checks. PA acknowledged with no orders given at this time.

## 2024-06-25 NOTE — Plan of Care (Signed)
  Problem: Education: Goal: Knowledge of General Education information will improve Description: Including pain rating scale, medication(s)/side effects and non-pharmacologic comfort measures Outcome: Progressing   Problem: Health Behavior/Discharge Planning: Goal: Ability to manage health-related needs will improve Outcome: Progressing   Problem: Clinical Measurements: Goal: Ability to maintain clinical measurements within normal limits will improve Outcome: Progressing Goal: Will remain free from infection Outcome: Progressing Goal: Diagnostic test results will improve Outcome: Progressing Goal: Respiratory complications will improve Outcome: Progressing Goal: Cardiovascular complication will be avoided 06/25/2024 1903 by Josiah Zane NOVAK, RN Outcome: Progressing 06/25/2024 1902 by Josiah Zane NOVAK, RN Outcome: Progressing   Problem: Activity: Goal: Risk for activity intolerance will decrease 06/25/2024 1903 by Josiah Zane NOVAK, RN Outcome: Progressing 06/25/2024 1902 by Josiah Zane NOVAK, RN Outcome: Progressing   Problem: Nutrition: Goal: Adequate nutrition will be maintained 06/25/2024 1903 by Josiah Zane NOVAK, RN Outcome: Progressing 06/25/2024 1902 by Josiah Zane NOVAK, RN Outcome: Progressing   Problem: Coping: Goal: Level of anxiety will decrease Outcome: Progressing   Problem: Elimination: Goal: Will not experience complications related to bowel motility Outcome: Progressing Goal: Will not experience complications related to urinary retention Outcome: Progressing   Problem: Pain Managment: Goal: General experience of comfort will improve and/or be controlled Outcome: Progressing   Problem: Safety: Goal: Ability to remain free from injury will improve Outcome: Progressing   Problem: Skin Integrity: Goal: Risk for impaired skin integrity will decrease 06/25/2024 1903 by Josiah Zane NOVAK, RN Outcome:  Progressing 06/25/2024 1902 by Josiah Zane NOVAK, RN Outcome: Progressing

## 2024-06-25 NOTE — Progress Notes (Signed)
  Progress Note  Patient Name: Colleen Williamson Date of Encounter: 06/25/2024 [facility] Cardiologist: Evalene Lunger, MD   Interval Summary    Patient remains in Aflutter with rates int he 80s. When she got up to use the bathroom tele showed runs of NSVT vs afib/flutter with aberrancy. She felt weak and heart was racing. No chest pain.   Vital Signs Vitals:   06/25/24 0001 06/25/24 0317 06/25/24 0815 06/25/24 1031  BP: 120/78 (!) 141/97 (!) 145/99 (!) 145/99  Pulse: 79 81 81 80  Resp: 17 17 18    Temp: 97.6 F (36.4 C) 97.6 F (36.4 C) 97.9 F (36.6 C)   TempSrc:   Oral   SpO2: 97% 99% 99%   Weight:      Height:        Intake/Output Summary (Last 24 hours) at 06/25/2024 1102 Last data filed at 06/25/2024 1038 Gross per 24 hour  Intake 2185.18 ml  Output --  Net 2185.18 ml      06/24/2024    6:14 PM 06/19/2024    9:55 AM 03/29/2024    8:53 AM  Last 3 Weights  Weight (lbs) 134 lb 7.7 oz 134 lb 139 lb  Weight (kg) 61 kg 60.782 kg 63.05 kg      Telemetry/ECG  Aflutter HR 80s, runs of NSVT vs aflutter with aberrancy HR to the 150s - Personally Reviewed  Physical Exam  GEN: No acute distress.   Neck: No JVD Cardiac: Reg Irreg, no murmurs, rubs, or gallops.  Respiratory: Clear to auscultation bilaterally. GI: Soft, nontender, non-distended  MS: No edema  CV studies: Echo 05/2024 1. Left ventricular ejection fraction, by estimation, is 55 to 60%. The  left ventricle has normal function. The left ventricle has no regional  wall motion abnormalities. Left ventricular diastolic function could not  be evaluated.   2. Right ventricular systolic function is normal. The right ventricular  size is normal.   3. The mitral valve has been repaired/replaced. Mild mitral valve  regurgitation. No evidence of mitral stenosis.   4. The aortic valve is tricuspid. Aortic valve regurgitation is mild to  moderate. No aortic stenosis is present.   5. The inferior vena  cava is normal in size with greater than 50%  respiratory variability, suggesting right atrial pressure of 3 mmHg.     Assessment & Plan   Persistent Afib/fluter - recent repeat DCCV 11/26 on Tikosyn , presenting back to the ER found to be back in rapid afib/flutter - EKG showed likely Aflutter 165bpm,IVCD, Qtc - repeat EKG showed Afib/flutter 128bpm, Qtc - per EP, plan was to stop Tikosyn  and start amiodarone . Stop Tikosyn  and started on IV amiodarone  - Lopressor  50mg  BID - Eliquis  5mg BID - remains in Aflutter with HR 80s, upon exertion this AM heart rate elevated to the 150s, NSVT runs vs afib/flutter with aberrancy. Will review with MD.  - if patient does not medically convert, may need repeat DCCV. Would benefit from EP consult tomorrow   HLD - continue statin therapy   Elevated troponin - HS trop 24>24 - no recent ischemic work-up - ETT 05/2015 was normal - no chest pain reported - suspect supply demand mismatch - any further work-up can be as OP    For questions or updates, please contact [facility] Please consult [email or web address] for contact info under         Signed, Demontrez Rindfleisch VEAR Fishman, PA-C

## 2024-06-26 DIAGNOSIS — Z8673 Personal history of transient ischemic attack (TIA), and cerebral infarction without residual deficits: Secondary | ICD-10-CM

## 2024-06-26 DIAGNOSIS — I482 Chronic atrial fibrillation, unspecified: Secondary | ICD-10-CM | POA: Diagnosis not present

## 2024-06-26 DIAGNOSIS — I10 Essential (primary) hypertension: Secondary | ICD-10-CM

## 2024-06-26 DIAGNOSIS — I484 Atypical atrial flutter: Secondary | ICD-10-CM | POA: Diagnosis not present

## 2024-06-26 DIAGNOSIS — E785 Hyperlipidemia, unspecified: Secondary | ICD-10-CM

## 2024-06-26 DIAGNOSIS — I480 Paroxysmal atrial fibrillation: Secondary | ICD-10-CM

## 2024-06-26 DIAGNOSIS — I471 Supraventricular tachycardia, unspecified: Secondary | ICD-10-CM

## 2024-06-26 LAB — BASIC METABOLIC PANEL WITH GFR
Anion gap: 9 (ref 5–15)
BUN: 17 mg/dL (ref 8–23)
CO2: 28 mmol/L (ref 22–32)
Calcium: 9.2 mg/dL (ref 8.9–10.3)
Chloride: 105 mmol/L (ref 98–111)
Creatinine, Ser: 0.74 mg/dL (ref 0.44–1.00)
GFR, Estimated: >60 mL/min (ref >60)
Glucose, Bld: 82 mg/dL (ref 70–99)
Potassium: 3.6 mmol/L (ref 3.5–5.1)
Sodium: 142 mmol/L (ref 135–145)

## 2024-06-26 LAB — TSH: TSH: 1.49 u[IU]/mL (ref 0.350–4.500)

## 2024-06-26 LAB — T4, FREE: Free T4: 1.63 ng/dL — ABNORMAL HIGH (ref 0.61–1.12)

## 2024-06-26 MED ORDER — AMIODARONE HCL IN DEXTROSE 360-4.14 MG/200ML-% IV SOLN
60.0000 mg/h | INTRAVENOUS | Status: DC
  Administered 2024-06-26 (×2): 60 mg/h via INTRAVENOUS
  Filled 2024-06-26 (×2): qty 200

## 2024-06-26 MED ORDER — AMIODARONE HCL IN DEXTROSE 360-4.14 MG/200ML-% IV SOLN
30.0000 mg/h | INTRAVENOUS | Status: DC
  Administered 2024-06-26 – 2024-06-28 (×5): 30 mg/h via INTRAVENOUS
  Filled 2024-06-26 (×3): qty 200

## 2024-06-26 MED ORDER — AMIODARONE HCL 200 MG PO TABS: 200.0000 mg | ORAL_TABLET | Freq: Every day | ORAL | Status: DC

## 2024-06-26 MED ORDER — AMIODARONE HCL 200 MG PO TABS
200.0000 mg | ORAL_TABLET | Freq: Two times a day (BID) | ORAL | Status: DC
  Administered 2024-06-26: 200 mg via ORAL
  Filled 2024-06-26: qty 1

## 2024-06-26 MED ORDER — POTASSIUM CHLORIDE CRYS ER 20 MEQ PO TBCR
20.0000 meq | EXTENDED_RELEASE_TABLET | Freq: Every day | ORAL | Status: DC
  Administered 2024-06-26 – 2024-06-27 (×2): 20 meq via ORAL
  Filled 2024-06-26 (×2): qty 1

## 2024-06-26 MED ORDER — DOCUSATE SODIUM 100 MG PO CAPS
200.0000 mg | ORAL_CAPSULE | Freq: Two times a day (BID) | ORAL | Status: DC
  Administered 2024-06-26 – 2024-06-27 (×2): 200 mg via ORAL
  Filled 2024-06-26 (×6): qty 2

## 2024-06-26 MED ORDER — AMIODARONE LOAD VIA INFUSION
150.0000 mg | Freq: Once | INTRAVENOUS | Status: AC
  Administered 2024-06-26: 150 mg via INTRAVENOUS
  Filled 2024-06-26: qty 83.34

## 2024-06-26 NOTE — Consult Note (Signed)
 ELECTROPHYSIOLOGY CONSULT NOTE    Patient ID: JAEDAN SCHUMAN MRN: [id number], DOB/AGE: 76/22/1947 76 y.o.  Admit date: 06/23/2024 Date of Consult: 06/26/2024  Primary Physician: Auston Reyes BIRCH, MD Primary Cardiologist: Evalene Lunger, MD  Electrophysiologist: Dr. Cindie   Referring Provider: @ATTENDING @  Patient Profile: Colleen Williamson is a 78 y.o. female with a history of MVR (2016) c/b CVA, afib, aflutter, HTN, SVT, anxiety/depression  who is being seen today for the evaluation of recurrent atrial flutter at the request of Dr. Trudy.  HPI:  TIGERLILY CHRISTINE is a 78 y.o. female with PMH as above.   She is s/p Aflutter ablation 2016 by Dr. Waddell.   She is status post A-fib ablation with isolation of pulmonary veins, posterior wall, perimitral atypical flutter ablation 05/2023 by Dr. Cindie. She had recurrence of aflutter and SVT, and was loaded on tikosyn  10/2023.   She was hospitalized last week with recurrent aflutter with  concern for failing tikosyn . She requested to continue tikosyn  and is s/p DCCV 11/26 with return to sinus rhythm and was discharged 11/26.   She presented back to ER 11/28 with palpitations and heart fluttering. In ER, HR up to 166, with labs notable for K 3.5. She was given IV lopressor .  She was started on IV amiodarone  11/29 and has improvement in ventricular rates since that time. EP consulted to further discuss and manager her atrial arrhythmia.   She has not had any further dizziness. Walked around unit x 2 and felt OK. She denies chest pain, chest pressure. Has intermittent palpitations, but not as symptomatic as when she presented to ER.    Labs Potassium3.6 (12/01 9562) Magnesium   1.9 (11/29 0533) Creatinine, ser  0.74 (12/01 0437) PLT  148* (11/29 0533) HGB  13.0 (11/29 0533) WBC 4.7 (11/29 0533)  .    Past Medical History:  Diagnosis Date   Abnormal mammogram, unspecified 2014   Anxiety    Atypical atrial flutter (HCC)     a. 12/2014 s/p RFCA.   History of cardiac cath    a. 03/2014 Cath [facility]): Nl cors.   Mitral valve disorder    a. 03/2014 s/p MV repair [facility]); b. 06/2016 Echo: EF 55-60%, no rwma, Nl fxn'ing MV w/o stenosis. Mildly dil LA/RA.   PAF (paroxysmal atrial fibrillation) (HCC)    a. 06/2016 s/p DCCV; b.  CHA2DS2VASc = 4-->Eliquis . Rhythm mgmt w/ amio (prev intol to flecainide ).   PSVT (paroxysmal supraventricular tachycardia)    Stroke (HCC)    a. 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     Surgical History:  Past Surgical History:  Procedure Laterality Date   ABDOMINAL HYSTERECTOMY  2004   ATRIAL FIBRILLATION ABLATION N/A 06/10/2023   Procedure: ATRIAL FIBRILLATION ABLATION;  Surgeon: Cindie Ole DASEN, MD;  Location: [facility] INVASIVE CV LAB;  Service: Cardiovascular;  Laterality: N/A;   BREAST CYST ASPIRATION  15 YRS AGO   CARDIOVERSION N/A 09/21/2014   Procedure: CARDIOVERSION;  Surgeon: Maude JAYSON Emmer, MD;  Location: [facility] ENDOSCOPY;  Service: Cardiovascular;  Laterality: N/A;   CARDIOVERSION N/A 02/26/2023   Procedure: CARDIOVERSION;  Surgeon: Lunger Evalene PARAS, MD;  Location: [facility] ORS;  Service: Cardiovascular;  Laterality: N/A;   CARDIOVERSION N/A 11/16/2023   Procedure: CARDIOVERSION;  Surgeon: Shlomo Wilbert SAUNDERS, MD;  Location: [facility] INVASIVE CV LAB;  Service: Cardiovascular;  Laterality: N/A;   CARDIOVERSION N/A 06/21/2024   Procedure: CARDIOVERSION;  Surgeon: Lunger Evalene PARAS, MD;  Location: [facility] ORS;  Service:  Cardiovascular;  Laterality: N/A;   COLONOSCOPY WITH PROPOFOL  N/A 05/15/2020   Procedure: COLONOSCOPY WITH PROPOFOL ;  Surgeon: Toledo, Ladell POUR, MD;  Location: [facility] ENDOSCOPY;  Service: Endoscopy;  Laterality: N/A;   ELECTROPHYSIOLOGIC STUDY N/A 11/28/2014   Procedure: Cardioversion;  Surgeon: Vinie DELENA Jude, MD;  Location: [facility] INVASIVE CV LAB;  Service: Cardiovascular;  Laterality: N/A;   ELECTROPHYSIOLOGIC STUDY N/A 12/27/2014   Procedure: A-Flutter;  Surgeon: Danelle LELON Birmingham, MD;   Location: [facility] INVASIVE CV LAB;  Service: Cardiovascular;  Laterality: N/A;   ELECTROPHYSIOLOGIC STUDY N/A 11/28/2014   Procedure: Cardioversion;  Surgeon: Vinie DELENA Jude, MD;  Location: [facility] ORS;  Service: Cardiovascular;  Laterality: N/A;  Have discussed with Dr. Debby   ELECTROPHYSIOLOGIC STUDY N/A 07/13/2016   Procedure: Cardioversion;  Surgeon: Deatrice DELENA Cage, MD;  Location: [facility] ORS;  Service: Cardiovascular;  Laterality: N/A;   EYE SURGERY  2007   MITRAL VALVE REPAIR  05/2014   RADIOLOGY WITH ANESTHESIA N/A 07/03/2014   Procedure: RADIOLOGY WITH ANESTHESIA;  Surgeon: Thyra POUR Nash, MD;  Location: [facility] OR;  Service: Radiology;  Laterality: N/A;   TRANSESOPHAGEAL ECHOCARDIOGRAM (CATH LAB) N/A 11/16/2023   Procedure: TRANSESOPHAGEAL ECHOCARDIOGRAM;  Surgeon: Shlomo Wilbert SAUNDERS, MD;  Location: [facility] INVASIVE CV LAB;  Service: Cardiovascular;  Laterality: N/A;     Medications Prior to Admission  Medication Sig Dispense Refill Last Dose/Taking   apixaban  (ELIQUIS ) 5 MG TABS tablet Take 1 tablet (5 mg total) by mouth 2 (two) times daily. 180 tablet 1 06/23/2024 at  8:00 PM   busPIRone  (BUSPAR ) 10 MG tablet Take 10 mg by mouth 2 (two) times daily.   06/23/2024 at  7:00 PM   dofetilide  (TIKOSYN ) 250 MCG capsule Take 1 capsule (250 mcg total) by mouth 2 (two) times daily. 180 capsule 3 06/23/2024 at  8:00 PM   ipratropium (ATROVENT) 0.03 % nasal spray Place 1 spray into both nostrils daily as needed (allergies/congestion.).   Unknown   Melatonin 10 MG TABS Take 10 mg by mouth at bedtime.   Taking   metoprolol  tartrate (LOPRESSOR ) 50 MG tablet Take 1 tablet (50 mg total) by mouth 2 (two) times daily. 60 tablet 0 06/23/2024 at  8:30 PM   potassium chloride  (KLOR-CON  M) 10 MEQ tablet Take 1 tablet (10 mEq total) by mouth daily. 30 tablet 3 06/23/2024 Morning   rosuvastatin  (CRESTOR ) 10 MG tablet Take 10 mg by mouth in the morning.   06/23/2024 Morning   amoxicillin (AMOXIL) 500 MG capsule Take 2,000 mg  by mouth See admin instructions. Take 4 capsules (2000 mg) by mouth 1 hour prior to dental appointments. (Patient not taking: Reported on 06/24/2024)   Not Taking   clonazePAM  (KLONOPIN ) 0.5 MG tablet Take 0.5 mg by mouth.       Inpatient Medications:   apixaban   5 mg Oral BID   clonazePAM   0.5 mg Oral QHS   metoprolol  tartrate  50 mg Oral BID   potassium chloride   10 mEq Oral Daily   rosuvastatin   10 mg Oral Daily    Allergies:  Allergies  Allergen Reactions   Flecainide      Funny taste; hairloss   Sulfa Antibiotics Nausea Only    Family History  Problem Relation Age of Onset   Cancer Father    Cancer Other        BOTH SIDES OF THE FAMILY   Diabetes Other        MATERNAL SIDE OF THE FAMILY   Breast cancer Maternal  Aunt      Physical Exam: Vitals:   06/25/24 1632 06/25/24 2018 06/26/24 0000 06/26/24 0521  BP: (!) 136/97 117/81 116/78 (!) 127/96  Pulse: 82 79 86 82  Resp: 18 17    Temp: 98.4 F (36.9 C) 98.1 F (36.7 C) 98.4 F (36.9 C) 98.1 F (36.7 C)  TempSrc: Oral  Oral Oral  SpO2: 100% 98% 99% 99%  Weight:      Height:        GEN- NAD, A&O x 3, normal affect HEENT: Normocephalic, atraumatic Lungs- CTAB, Normal effort.  Heart- Regular rate and rhythm, No M/G/R.  GI- Soft, NT, ND.  Extremities- No clubbing, cyanosis, or edema   Radiology/Studies: DG Chest Portable 1 View Result Date: 06/23/2024 CLINICAL DATA:  Palpitations. EXAM: PORTABLE CHEST 1 VIEW COMPARISON:  June 19, 2024 FINDINGS: Multiple sternal wires are present. The heart size and mediastinal contours are within normal limits. There is moderate severity calcification of the aortic arch. 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 The lungs are hyperinflated. No acute infiltrate, pleural effusion or pneumothorax is identified. The visualized skeletal structures are unremarkable. IMPRESSION: 1. 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 2. No acute or active cardiopulmonary disease.  Electronically Signed   By: Suzen Dials M.D.   On: 06/23/2024 21:54   ECHOCARDIOGRAM COMPLETE Result Date: 06/20/2024    ECHOCARDIOGRAM REPORT   Patient Name:   VARINA HULON Holderness Date of Exam: 06/20/2024 Medical Rec #:  [id number]        Height:       68.0 in Accession #:    [id number]       Weight:       134.0 lb Date of Birth:  Oct 21, 1945        BSA:          1.724 m Patient Age:    78 years         BP:           106/69 mmHg Patient Gender: F                HR:           95 bpm. Exam Location:  [facility] Procedure: 2D Echo, Cardiac Doppler and Color Doppler (Both Spectral and Color            Flow Doppler were utilized during procedure). Indications:     Atrial Fibrillation I48.91  History:         Patient has prior history of Echocardiogram examinations, most                  recent 11/16/2023. Prior Cardiac Surgery, Mitral Valve Disease                  and Mitral Valve Replacement; Arrythmias:Atrial Fibrillation.  Sonographer:     Ashley McNeely-Sloane Referring Phys:  [id number] FLYJFFJI A ARIDA Diagnosing Phys: Lonni Hanson MD IMPRESSIONS  1. Left ventricular ejection fraction, by estimation, is 55 to 60%. The left ventricle has normal function. The left ventricle has no regional wall motion abnormalities. Left ventricular diastolic function could not be evaluated.  2. Right ventricular systolic function is normal. The right ventricular size is normal.  3. The mitral valve has been repaired/replaced. Mild mitral valve regurgitation. No evidence of mitral stenosis.  4. The aortic valve is tricuspid. Aortic valve regurgitation is mild to moderate. No aortic stenosis is present.  5. The inferior vena cava is normal in size with greater than  50% respiratory variability, suggesting right atrial pressure of 3 mmHg. FINDINGS  Left Ventricle: Left ventricular ejection fraction, by estimation, is 55 to 60%. The left ventricle has normal function. The left ventricle has no regional wall motion abnormalities. The left  ventricular internal cavity size was normal in size. There is  borderline left ventricular hypertrophy. Left ventricular diastolic function could not be evaluated due to mitral valve repair. Left ventricular diastolic function could not be evaluated. Right Ventricle: The right ventricular size is normal. No increase in right ventricular wall thickness. Right ventricular systolic function is normal. Left Atrium: Left atrial size was normal in size. Right Atrium: Right atrial size was normal in size. Pericardium: There is no evidence of pericardial effusion. Mitral Valve: The mitral valve has been repaired/replaced. There is moderate thickening of the mitral valve leaflet(s). Mild mitral valve regurgitation. There is a prosthetic annuloplasty ring present in the mitral position. No evidence of mitral valve stenosis. MV peak gradient, 3.0 mmHg. The mean mitral valve gradient is 2.0 mmHg. Tricuspid Valve: The tricuspid valve is normal in structure. Tricuspid valve regurgitation is mild. Aortic Valve: The aortic valve is tricuspid. Aortic valve regurgitation is mild to moderate. Aortic regurgitation PHT measures 293 msec. No aortic stenosis is present. Aortic valve mean gradient measures 1.0 mmHg. Aortic valve peak gradient measures 2.2 mmHg. Aortic valve area, by VTI measures 2.76 cm. Pulmonic Valve: The pulmonic valve was grossly normal. Pulmonic valve regurgitation is mild. No evidence of pulmonic stenosis. Aorta: The aortic root and ascending aorta are structurally normal, with no evidence of dilitation. Pulmonary Artery: The pulmonary artery is of normal size. Venous: The inferior vena cava is normal in size with greater than 50% respiratory variability, suggesting right atrial pressure of 3 mmHg. IAS/Shunts: The interatrial septum was not well visualized.  LEFT VENTRICLE PLAX 2D LVIDd:         3.70 cm     Diastology LVIDs:         2.70 cm     LV e' medial:    4.24 cm/s LV PW:         1.10 cm     LV E/e' medial:   23.0 LV IVS:        0.90 cm     LV e' lateral:   12.20 cm/s LVOT diam:     2.20 cm     LV E/e' lateral: 8.0 LV SV:         30 LV SV Index:   17 LVOT Area:     3.80 cm LV IVRT:       67 msec  LV Volumes (MOD) LV vol d, MOD A2C: 39.2 ml LV vol d, MOD A4C: 27.0 ml LV vol s, MOD A2C: 23.1 ml LV vol s, MOD A4C: 14.9 ml LV SV MOD A2C:     16.1 ml LV SV MOD A4C:     27.0 ml LV SV MOD BP:      13.8 ml RIGHT VENTRICLE            IVC RV Basal diam:  3.60 cm    IVC diam: 1.20 cm RV Mid diam:    3.20 cm RV S prime:     6.15 cm/s  PULMONARY VEINS TAPSE (M-mode): 0.7 cm     Systolic Velocity: 66.60 cm/s LEFT ATRIUM             Index        RIGHT ATRIUM  Index LA diam:        3.00 cm 1.74 cm/m   RA Area:     13.10 cm LA Vol (A2C):   35.3 ml 20.48 ml/m  RA Volume:   32.30 ml  18.74 ml/m LA Vol (A4C):   22.9 ml 13.28 ml/m LA Biplane Vol: 29.1 ml 16.88 ml/m  AORTIC VALVE AV Area (Vmax):    2.49 cm AV Area (Vmean):   2.37 cm AV Area (VTI):     2.76 cm AV Vmax:           74.30 cm/s AV Vmean:          51.300 cm/s AV VTI:            0.108 m AV Peak Grad:      2.2 mmHg AV Mean Grad:      1.0 mmHg LVOT Vmax:         48.70 cm/s LVOT Vmean:        32.000 cm/s LVOT VTI:          0.078 m LVOT/AV VTI ratio: 0.72 AI PHT:            293 msec  AORTA Ao Root diam: 3.40 cm Ao Asc diam:  3.50 cm MITRAL VALVE MV Area (PHT): 5.97 cm    SHUNTS MV Area VTI:   1.52 cm    Systemic VTI:  0.08 m MV Peak grad:  3.0 mmHg    Systemic Diam: 2.20 cm MV Mean grad:  2.0 mmHg MV Vmax:       0.87 m/s MV Vmean:      62.3 cm/s MV Decel Time: 127 msec MV E velocity: 97.50 cm/s Lonni Hanson MD Electronically signed by Lonni Hanson MD Signature Date/Time: 06/20/2024/1:43:45 PM    Final    DG Chest Port 1 View Result Date: 06/19/2024 CLINICAL DATA:  Atrial fibrillation. EXAM: PORTABLE CHEST 1 VIEW COMPARISON:  Chest radiograph dated 11/15/2023 FINDINGS: No focal consolidation, pleural effusion or pneumothorax. Stable cardiac silhouette.  Median sternotomy wires and cardiac valve repair. No acute osseous pathology. IMPRESSION: No active disease. Electronically Signed   By: Vanetta Chou M.D.   On: 06/19/2024 11:16    EKG:  06/23/2024 - aflutter at 165 06/23/2024 - aflutter 06/26/2024 - 2:1 Aflutter at 82  (personally reviewed)  TELEMETRY: Aflutter with rates 70-80s, increased to 100s with activity (personally reviewed)  DEVICE HISTORY: none  Assessment/Plan: #) aflutter #) SVT Patient presented to [facility] ER with recurrent SVT/aflutter with RVR.  Her tikosyn  was stopped and IV amiodarone  initiated Significant improvement in ventricular rates, now 70-80s but remains in aflutter Given she feels well with activity and HR remained controlled, will transition IV amio > PO amiodarone  No plans for DCCV today If pulse remains well-controlled this afternoon, ok to discharge from EP perspective  If discharges, continue 200mg  amiodarone  BID x 14 days, then 200mg  daily Start 25mg  lopressor  PRN for HR > 140, ok to repeat every for a total of 3.    For questions or updates, please contact [facility] Please consult [email or web address] for contact info under Cardiology/STEMI.  Signed, Carsyn Boster, NP  06/26/2024 7:44 AM

## 2024-06-26 NOTE — Progress Notes (Signed)
 Progress Note  Patient Name: Colleen Williamson Date of Encounter: 06/26/2024 [facility] Cardiologist: Timothy Gollan, MD   Interval Summary    She was switched from IV to oral amiodarone .  Initial plans were to discharge home with outpatient follow-up.  However, the patient had frequent episodes of tachycardia this afternoon after she walked in the unit.  She developed extreme shortness of breath when she returned to her room.  I reviewed her telemetry which showed frequent runs of atypical atrial flutter with RVR.  In addition, she had 2 prolonged episodes of wide-complex tachycardia.  These were reviewed with Dr. Almetta and it is likely atrial flutter with aberrancy.  She seems to be having intermittent one-to-one conduction.  Vital Signs Vitals:   06/26/24 0000 06/26/24 0521 06/26/24 0800 06/26/24 1100  BP: 116/78 (!) 127/96 (!) 151/87 (!) 142/82  Pulse: 86 82 81 82  Resp:   17 16  Temp: 98.4 F (36.9 C) 98.1 F (36.7 C) 97.6 F (36.4 C) 98 F (36.7 C)  TempSrc: Oral Oral    SpO2: 99% 99% 100% 100%  Weight:      Height:        Intake/Output Summary (Last 24 hours) at 06/26/2024 1556 Last data filed at 06/25/2024 1916 Gross per 24 hour  Intake 300.37 ml  Output --  Net 300.37 ml      06/24/2024    6:14 PM 06/19/2024    9:55 AM 03/29/2024    8:53 AM  Last 3 Weights  Weight (lbs) 134 lb 7.7 oz 134 lb 139 lb  Weight (kg) 61 kg 60.782 kg 63.05 kg      Telemetry/ECG  Aflutter HR 80s, runs of NSVT vs aflutter with aberrancy HR to the 150s - Personally Reviewed  Physical Exam  GEN: No acute distress.   Neck: No JVD Cardiac: Reg Irreg, no murmurs, rubs, or gallops.  Respiratory: Clear to auscultation bilaterally. GI: Soft, nontender, non-distended  MS: No edema  CV studies: Echo 05/2024 1. Left ventricular ejection fraction, by estimation, is 55 to 60%. The  left ventricle has normal function. The left ventricle has no regional  wall motion  abnormalities. Left ventricular diastolic function could not  be evaluated.   2. Right ventricular systolic function is normal. The right ventricular  size is normal.   3. The mitral valve has been repaired/replaced. Mild mitral valve  regurgitation. No evidence of mitral stenosis.   4. The aortic valve is tricuspid. Aortic valve regurgitation is mild to  moderate. No aortic stenosis is present.   5. The inferior vena cava is normal in size with greater than 50%  respiratory variability, suggesting right atrial pressure of 3 mmHg.     Assessment & Plan   Persistent Afib/fluter - recent repeat DCCV 11/26 on Tikosyn , presenting back to the ER found to be back in rapid afib/flutter - Highly symptomatic atrial flutter with RVR.  Heart rate seems to be controlled at rest but increases significantly with minimal exertion. I discussed with the EP.  We need to get her back in sinus rhythm. I elected to switch her back from oral to IV amiodarone  and will continue IV loading. If she remains in atrial flutter, we will plan on proceeding with cardioversion on Wednesday.   HLD - continue statin therapy   Elevated troponin - HS trop 24>24 - no recent ischemic work-up - ETT 05/2015 was normal - no chest pain reported - suspect supply demand mismatch - any further work-up  can be as OP  Status post mitral valve replacement: Most recent echo showed mild MR.  History of tachycardia induced cardiomyopathy: Most recent echocardiogram showed normal LV systolic function.   For questions or updates, please contact [facility] Please consult [email or web address] for contact info under         Signed, Deatrice Cage, MD

## 2024-06-26 NOTE — Progress Notes (Signed)
 PROGRESS NOTE    Colleen Williamson  FMW:[id number] DOB: [birth year] DOA: 06/23/2024 PCP: Auston Reyes BIRCH, MD   Assessment & Plan:   Principal Problem:   PSVT (paroxysmal supraventricular tachycardia) Active Problems:   Paroxysmal atrial fibrillation (HCC)   Dyslipidemia   Anxiety and depression   Atrial fibrillation with RVR (HCC)  Assessment and Plan:  Chronic a.fib/flutter: as per cardio. W/ RVR. Poorly controlled w/ hx of multiple cardioversions and ablations as per pt. D/c tikosyn . D/c IV amio drip and then restarted IV amio drip back for HR increasing to 170s w/ ambulating and wide complex tachycardia as per cardio. Will likely have another cardioversion. Continue on metoprolol , eliquis    Depression: severity unknown. Pt's daughter requested for the pt to stop taking bupirone for now  Anxiety: severity unknown. Continue on home dose of klonopin      HLD: continue on statin      DVT prophylaxis: eliquis  Code Status: full  Family Communication: discussed pt's care w/ pt's family at bedside and answered their questions  Disposition Plan: likely d/c back home  Level of care: Progressive  Status is: Observation Status is: Inpatient Remains inpatient appropriate because: severity of illness      Consultants:  Cardio  EP  Procedures:   Antimicrobials:   Subjective: Pt c/o intermittent palpitations  Objective: Vitals:   06/25/24 2018 06/26/24 0000 06/26/24 0521 06/26/24 0800  BP: 117/81 116/78 (!) 127/96 (!) 151/87  Pulse: 79 86 82 81  Resp: 17   17  Temp: 98.1 F (36.7 C) 98.4 F (36.9 C) 98.1 F (36.7 C) 97.6 F (36.4 C)  TempSrc:  Oral Oral   SpO2: 98% 99% 99% 100%  Weight:      Height:        Intake/Output Summary (Last 24 hours) at 06/26/2024 0910 Last data filed at 06/25/2024 1916 Gross per 24 hour  Intake 779.01 ml  Output --  Net 779.01 ml   Filed Weights   06/24/24 1814  Weight: 61 kg    Examination:  General exam: appears  comfortable  Respiratory system: clear breath sounds b/l  Cardiovascular system: irregularly irregular Gastrointestinal system: abd is soft, NT, ND, hypoactive bowel sounds Central nervous system: alert & oriented. Moves all extremities  Psychiatry: Judgement and insight appears at baseline. Flat mood and affect    Data Reviewed: I have personally reviewed following labs and imaging studies  CBC: Recent Labs  Lab 06/19/24 0956 06/21/24 0905 06/23/24 2128 06/24/24 0533  WBC 5.6 5.4 6.6 4.7  NEUTROABS 3.4 3.3  --   --   HGB 15.6* 15.9* 15.0 13.0  HCT 47.8* 49.8* 47.0* 41.5  MCV 88.4 87.4 90.7 90.8  PLT 170 182 183 148*   Basic Metabolic Panel: Recent Labs  Lab 06/19/24 0956 06/21/24 0905 06/23/24 2128 06/24/24 0533 06/25/24 0512 06/26/24 0437  NA 140 139 144 141 142 142  K 4.1 4.2 3.6 3.5 3.4* 3.6  CL 103 103 107 110 107 105  CO2 27 27 28 24 26 28   GLUCOSE 118* 96 75 86 83 82  BUN 16 22 24* 19 16 17   CREATININE 0.78 0.72 0.73 0.55 0.60 0.74  CALCIUM  9.6 9.5 9.6 8.6* 8.7* 9.2  MG 2.0  --   --  1.9  --   --    GFR: Estimated Creatinine Clearance: 57.6 mL/min (by C-G formula based on SCr of 0.74 mg/dL). Liver Function Tests: Recent Labs  Lab 06/21/24 0905  AST 28  ALT 16  ALKPHOS 92  BILITOT 1.5*  PROT 6.8  ALBUMIN 4.1   No results for input(s): LIPASE, AMYLASE in the last 168 hours. No results for input(s): AMMONIA in the last 168 hours. Coagulation Profile: No results for input(s): INR, PROTIME in the last 168 hours. Cardiac Enzymes: No results for input(s): CKTOTAL, CKMB, CKMBINDEX, TROPONINI in the last 168 hours. BNP (last 3 results) Recent Labs    06/19/24 0956 06/23/24 2128  PROBNP 975.0* 1,078.0*   HbA1C: No results for input(s): HGBA1C in the last 72 hours. CBG: No results for input(s): GLUCAP in the last 168 hours. Lipid Profile: No results for input(s): CHOL, HDL, LDLCALC, TRIG, CHOLHDL, LDLDIRECT in  the last 72 hours. Thyroid  Function Tests: No results for input(s): TSH, T4TOTAL, FREET4, T3FREE, THYROIDAB in the last 72 hours. Anemia Panel: No results for input(s): VITAMINB12, FOLATE, FERRITIN, TIBC, IRON, RETICCTPCT in the last 72 hours. Sepsis Labs: No results for input(s): PROCALCITON, LATICACIDVEN in the last 168 hours.  No results found for this or any previous visit (from the past 240 hours).       Radiology Studies: No results found.       Scheduled Meds:  apixaban   5 mg Oral BID   clonazePAM   0.5 mg Oral QHS   metoprolol  tartrate  50 mg Oral BID   potassium chloride   10 mEq Oral Daily   rosuvastatin   10 mg Oral Daily   Continuous Infusions:  amiodarone  30 mg/hr (06/26/24 0125)     LOS: 2 days      Anthony CHRISTELLA Pouch, MD [facility] Pager [phone]  If 7PM-7AM, please contact night-coverage [email or web address] 06/26/2024, 9:10 AM

## 2024-06-26 NOTE — TOC CM/SW Note (Signed)
 Transition of Care [facility]) - Inpatient Brief Assessment   Patient Details  Name: Colleen Williamson MRN: [id number] Date of Birth: [birth year]  Transition of Care [facility] CM/SW Contact:    Lauraine JAYSON Carpen, LCSW Phone Number: 06/26/2024, 10:22 AM   Clinical Narrative: CSW reviewed chart. No TOC needs so far. CSW will continue to follow progress. Please place [facility] consult if any needs arise.  Transition of Care Asessment: Insurance and Status: Insurance coverage has been reviewed Patient has primary care physician: Yes Home environment has been reviewed: Single family home Prior level of function:: Not documented Prior/Current Home Services: No current home services Social Drivers of Health Review: SDOH reviewed no interventions necessary Readmission risk has been reviewed: Yes Transition of care needs: no transition of care needs at this time

## 2024-06-26 NOTE — Progress Notes (Signed)
 Mobility Specialist Progress Note:    06/26/24 0913  Mobility  Activity Ambulated with assistance  Level of Assistance Modified independent, requires aide device or extra time  Assistive Device Other (Comment);None (IV pole)  Distance Ambulated (ft) 500 ft  Range of Motion/Exercises Active;All extremities  Activity Response Tolerated well  Mobility visit 1 Mobility  Mobility Specialist Start Time (ACUTE ONLY) [id number]  Mobility Specialist Stop Time (ACUTE ONLY) 0856  Mobility Specialist Time Calculation (min) (ACUTE ONLY) 18 min   Pt Modi to stand and ambulate using IV pole for stability. Tolerated well, denies any symptoms besides weakness and fatigue after ambulation. Left in fowlers, daughter at bedside. All needs met.  Sherrilee Ditty Mobility Specialist Please contact via Special Educational Needs Teacher or  Rehab office at [phone]

## 2024-06-27 DIAGNOSIS — I4891 Unspecified atrial fibrillation: Secondary | ICD-10-CM | POA: Diagnosis not present

## 2024-06-27 DIAGNOSIS — I471 Supraventricular tachycardia, unspecified: Secondary | ICD-10-CM | POA: Diagnosis not present

## 2024-06-27 LAB — BASIC METABOLIC PANEL WITH GFR
Anion gap: 10 (ref 5–15)
BUN: 19 mg/dL (ref 8–23)
CO2: 26 mmol/L (ref 22–32)
Calcium: 9.2 mg/dL (ref 8.9–10.3)
Chloride: 104 mmol/L (ref 98–111)
Creatinine, Ser: 0.68 mg/dL (ref 0.44–1.00)
GFR, Estimated: >60 mL/min (ref >60)
Glucose, Bld: 86 mg/dL (ref 70–99)
Potassium: 3.9 mmol/L (ref 3.5–5.1)
Sodium: 140 mmol/L (ref 135–145)

## 2024-06-27 MED ORDER — SODIUM CHLORIDE 0.9 % IV SOLN: INTRAVENOUS | Status: AC

## 2024-06-27 NOTE — Care Management Important Message (Signed)
 Important Message  Patient Details  Name: METHA KOLASA MRN: [id number] Date of Birth: [birth year]   Important Message Given:  Yes - Medicare IM     Susane Bey W, CMA 06/27/2024, 1:05 PM

## 2024-06-27 NOTE — Plan of Care (Signed)

## 2024-06-27 NOTE — Progress Notes (Signed)
 PROGRESS NOTE    Colleen Williamson  FMW:[id number] DOB: March 11, 1946 DOA: 06/23/2024 PCP: Auston Reyes BIRCH, MD   Assessment & Plan:   Principal Problem:   PSVT (paroxysmal supraventricular tachycardia) Active Problems:   Paroxysmal atrial fibrillation (HCC)   Dyslipidemia   Anxiety and depression   Atrial fibrillation with RVR (HCC)  Assessment and Plan:  Chronic a.fib/flutter: as per cardio. W/ RVR. Poorly controlled w/ hx of multiple cardioversions and ablations as per pt. D/c tikosyn . D/c IV amio drip and then restarted IV amio drip back for HR increasing to 170s w/ ambulating and wide complex tachycardia as per cardio on 06/26/24.  Continue on IV amio drip, po metoprolol , & eliquis . Cardioversion tomorrow. NPO after midnight as per cardio    Depression: severity unknown. Pt's daughter requested for the pt to stop taking bupirone for now  Anxiety: severity unknown. Continue on home dose of klonopin      HLD: continue on statin       DVT prophylaxis: eliquis  Code Status: full  Family Communication: discussed pt's care w/ pt's family at bedside and answered their questions  Disposition Plan: likely d/c back home  Level of care: Progressive  Status is: Observation Status is: Inpatient Remains inpatient appropriate because: severity of illness      Consultants:  Cardio  EP  Procedures:   Antimicrobials:   Subjective: Pt c/o feeling lightheaded   Objective: Vitals:   06/26/24 2027 06/27/24 0008 06/27/24 0407 06/27/24 0809  BP: 126/79 135/84 122/79 127/83  Pulse: 81 (!) 56 81 86  Resp: 17 16 16 16   Temp: 97.8 F (36.6 C) (!) 97.4 F (36.3 C) 97.8 F (36.6 C) 97.9 F (36.6 C)  TempSrc:      SpO2: 98% 98% 96% 100%  Weight:      Height:        Intake/Output Summary (Last 24 hours) at 06/27/2024 0859 Last data filed at 06/27/2024 9392 Gross per 24 hour  Intake 663.7 ml  Output --  Net 663.7 ml   Filed Weights   06/24/24 1814  Weight: 61 kg     Examination:  General exam: appears calm & comfortable  Respiratory system: clear breath sounds b/l  Cardiovascular system: irregularly irregular  Gastrointestinal system: abd is soft, NT, ND & hypoactive bowel sounds Central nervous system: alert & oriented. Moves all extremities  Psychiatry: Judgement and insight appears at baseline. Flat mood and affect    Data Reviewed: I have personally reviewed following labs and imaging studies  CBC: Recent Labs  Lab 06/21/24 0905 06/23/24 2128 06/24/24 0533  WBC 5.4 6.6 4.7  NEUTROABS 3.3  --   --   HGB 15.9* 15.0 13.0  HCT 49.8* 47.0* 41.5  MCV 87.4 90.7 90.8  PLT 182 183 148*   Basic Metabolic Panel: Recent Labs  Lab 06/23/24 2128 06/24/24 0533 06/25/24 0512 06/26/24 0437 06/27/24 0507  NA 144 141 142 142 140  K 3.6 3.5 3.4* 3.6 3.9  CL 107 110 107 105 104  CO2 28 24 26 28 26   GLUCOSE 75 86 83 82 86  BUN 24* 19 16 17 19   CREATININE 0.73 0.55 0.60 0.74 0.68  CALCIUM  9.6 8.6* 8.7* 9.2 9.2  MG  --  1.9  --   --   --    GFR: Estimated Creatinine Clearance: 57.6 mL/min (by C-G formula based on SCr of 0.68 mg/dL). Liver Function Tests: Recent Labs  Lab 06/21/24 0905  AST 28  ALT 16  ALKPHOS  92  BILITOT 1.5*  PROT 6.8  ALBUMIN 4.1   No results for input(s): LIPASE, AMYLASE in the last 168 hours. No results for input(s): AMMONIA in the last 168 hours. Coagulation Profile: No results for input(s): INR, PROTIME in the last 168 hours. Cardiac Enzymes: No results for input(s): CKTOTAL, CKMB, CKMBINDEX, TROPONINI in the last 168 hours. BNP (last 3 results) Recent Labs    06/19/24 0956 06/23/24 2128  PROBNP 975.0* 1,078.0*   HbA1C: No results for input(s): HGBA1C in the last 72 hours. CBG: No results for input(s): GLUCAP in the last 168 hours. Lipid Profile: No results for input(s): CHOL, HDL, LDLCALC, TRIG, CHOLHDL, LDLDIRECT in the last 72 hours. Thyroid  Function  Tests: Recent Labs    06/26/24 0437  TSH 1.490  FREET4 1.63*   Anemia Panel: No results for input(s): VITAMINB12, FOLATE, FERRITIN, TIBC, IRON, RETICCTPCT in the last 72 hours. Sepsis Labs: No results for input(s): PROCALCITON, LATICACIDVEN in the last 168 hours.  No results found for this or any previous visit (from the past 240 hours).       Radiology Studies: No results found.       Scheduled Meds:  apixaban   5 mg Oral BID   clonazePAM   0.5 mg Oral QHS   docusate sodium   200 mg Oral BID   potassium chloride   20 mEq Oral Daily   rosuvastatin   10 mg Oral Daily   Continuous Infusions:  amiodarone  30 mg/hr (06/27/24 9366)     LOS: 3 days      Anthony CHRISTELLA Pouch, MD [facility] Pager [phone]  If 7PM-7AM, please contact night-coverage [email or web address] 06/27/2024, 8:59 AM

## 2024-06-27 NOTE — Progress Notes (Signed)
  Patient Name: Colleen Williamson Date of Encounter: 06/27/2024  Primary Cardiologist: Timothy Gollan, MD Electrophysiologist: OLE ONEIDA HOLTS, MD  Interval Summary   Continues to have episodes of palpitations, dizziness with ambulating.   Vital Signs    Vitals:   06/26/24 2027 06/27/24 0008 06/27/24 0407 06/27/24 0809  BP: 126/79 135/84 122/79 127/83  Pulse: 81 (!) 56 81 86  Resp: 17 16 16 16   Temp: 97.8 F (36.6 C) (!) 97.4 F (36.3 C) 97.8 F (36.6 C) 97.9 F (36.6 C)  TempSrc:      SpO2: 98% 98% 96% 100%  Weight:      Height:        Intake/Output Summary (Last 24 hours) at 06/27/2024 9167 Last data filed at 06/27/2024 0607 Gross per 24 hour  Intake 663.7 ml  Output --  Net 663.7 ml   Filed Weights   06/24/24 1814  Weight: 61 kg    Physical Exam    GEN- NAD, Alert and oriented  Lungs- Clear to ausculation bilaterally, normal work of breathing Cardiac- Regular rate and rhythm, no murmurs, rubs or gallops GI- soft, NT, ND, + BS Extremities- no clubbing or cyanosis. No edema  Telemetry    Largely 2:1 aflutter with rates 80s, periods of 1:1 aflutter w aberrancy with rates 170s w (personally reviewed)  Hospital Course    Colleen Williamson is a 78 y.o. female with  PMH of MVR (2016) c/b CVA, afib, aflutter, HTN, SVT, anxiety/depression admitted for recurrent atrial flutter  Assessment & Plan    #) aflutter #) SVT  Remains in aflutter with periods of 1:1 conduction with ventricular rates in 170s. Patient is quite symptomatic during these episodes. Continue IV amiodarone  today Will plan for DCCV tomorrow, NPO at MN Transition to PO amio after DCCV Continue 5mg  eliquis  BID for stroke ppx Keep K > 4, Mag > 2       For questions or updates, please contact [facility] Please consult [email or web address] for contact info under     Signed, Brookie Wayment, NP  06/27/2024, 8:32 AM

## 2024-06-27 NOTE — Plan of Care (Signed)

## 2024-06-28 ENCOUNTER — Ambulatory Visit: Admitting: Psychiatry

## 2024-06-28 ENCOUNTER — Encounter: Admission: EM | Disposition: A | Payer: Self-pay | Source: Home / Self Care | Attending: Internal Medicine

## 2024-06-28 ENCOUNTER — Inpatient Hospital Stay: Payer: Self-pay | Admitting: Certified Registered"

## 2024-06-28 ENCOUNTER — Encounter: Payer: Self-pay | Admitting: Certified Registered"

## 2024-06-28 ENCOUNTER — Ambulatory Visit: Admitting: Cardiology

## 2024-06-28 DIAGNOSIS — I4891 Unspecified atrial fibrillation: Secondary | ICD-10-CM | POA: Diagnosis not present

## 2024-06-28 DIAGNOSIS — I483 Typical atrial flutter: Secondary | ICD-10-CM | POA: Diagnosis not present

## 2024-06-28 HISTORY — PX: CARDIOVERSION: SHX1299

## 2024-06-28 LAB — BASIC METABOLIC PANEL WITH GFR
Anion gap: 9 (ref 5–15)
BUN: 19 mg/dL (ref 8–23)
CO2: 26 mmol/L (ref 22–32)
Calcium: 9 mg/dL (ref 8.9–10.3)
Chloride: 104 mmol/L (ref 98–111)
Creatinine, Ser: 0.7 mg/dL (ref 0.44–1.00)
GFR, Estimated: >60 mL/min (ref >60)
Glucose, Bld: 77 mg/dL (ref 70–99)
Potassium: 3.8 mmol/L (ref 3.5–5.1)
Sodium: 140 mmol/L (ref 135–145)

## 2024-06-28 LAB — MAGNESIUM: Magnesium: 2 mg/dL (ref 1.7–2.4)

## 2024-06-28 SURGERY — CARDIOVERSION
Anesthesia: General

## 2024-06-28 MED ORDER — SODIUM CHLORIDE 0.9 % IV SOLN: INTRAVENOUS | Status: DC | PRN

## 2024-06-28 MED ORDER — POTASSIUM CHLORIDE CRYS ER 20 MEQ PO TBCR
40.0000 meq | EXTENDED_RELEASE_TABLET | Freq: Every day | ORAL | Status: DC
  Administered 2024-06-28 – 2024-06-29 (×2): 40 meq via ORAL
  Filled 2024-06-28 (×2): qty 2

## 2024-06-28 MED ORDER — AMIODARONE HCL 200 MG PO TABS: 200.0000 mg | ORAL_TABLET | Freq: Every day | ORAL | Status: DC

## 2024-06-28 MED ORDER — PROPOFOL 10 MG/ML IV BOLUS
INTRAVENOUS | Status: DC | PRN
  Administered 2024-06-28: 60 mg via INTRAVENOUS

## 2024-06-28 MED ORDER — AMIODARONE HCL 200 MG PO TABS
200.0000 mg | ORAL_TABLET | Freq: Two times a day (BID) | ORAL | Status: DC
  Administered 2024-06-28 – 2024-06-29 (×3): 200 mg via ORAL
  Filled 2024-06-28 (×3): qty 1

## 2024-06-28 NOTE — Progress Notes (Signed)
 Per day Nurse Zebedee RN, patient was not urinating. Encouraged patient to walk around the unit and to drink water. Patient walked with daughter, 3 laps completed. This nurse told patient I would give her an hour after walking and drinking water. Upon arriving back to patients room she was able to urinate without difficulties. Post void bladder scan completed for 9 ml.

## 2024-06-28 NOTE — Transfer of Care (Signed)
 Immediate Anesthesia Transfer of Care Note  Patient: Colleen Williamson  Procedure(s) Performed: CARDIOVERSION  Patient Location: PACU  Anesthesia Type:General  Level of Consciousness: drowsy and patient cooperative  Airway & Oxygen Therapy: Patient Spontanous Breathing and Patient connected to nasal cannula oxygen  Post-op Assessment: Report given to RN and Post -op Vital signs reviewed and stable  Post vital signs: Reviewed and stable  Last Vitals:  Vitals Value Taken Time  BP 112/72 06/28/24 13:15  Temp    Pulse 73 06/28/24 13:17  Resp 13 06/28/24 13:17  SpO2 99 % 06/28/24 13:17    Last Pain:  Vitals:   06/28/24 1233  TempSrc: Temporal  PainSc: 0-No pain      Patients Stated Pain Goal: 0 (06/23/24 2140)  Complications: No notable events documented.

## 2024-06-28 NOTE — CV Procedure (Signed)
 Cardioversion procedure note For typical atrial flutter  Procedure Details:  Consent: Risks of procedure as well as the alternatives and risks of each were explained to the (patient/caregiver).  Consent for procedure obtained.  Time Out: Verified patient identification, verified procedure, site/side was marked, verified correct patient position, special equipment/implants available, medications/allergies/relevent history reviewed, required imaging and test results available.  Performed  Patient placed on cardiac monitor, pulse oximetry, supplemental oxygen as necessary.   Sedation given: propofol  IV, Dr. Vicci Pacer pads placed anterior and posterior chest.   Cardioverted 1 time(s).   Cardioverted at  120 J. Synchronized biphasic Converted to NSR   Evaluation: Findings: Post procedure EKG shows: NSR with rare PVC Complications: None Patient did tolerate procedure well.  Time Spent Directly with the Patient:  45 minutes   Tim Inaara Tye, M.D., Ph.D.

## 2024-06-28 NOTE — Progress Notes (Signed)
 PROGRESS NOTE   HPI was taken from Dr. Lawence: Colleen Williamson is a 78 y.o. Caucasian female with medical history significant for anxiety, paroxysmal atrial fibrillation/flutter, PSVT, CVA and anxiety, who presented to the emergency room with acute onset of palpitations that started at 7:30 PM without chest pain.  No fever or chills.  No cough or wheezing or dyspnea.  No nausea or vomiting or abdominal pain.  No dysuria, oliguria or hematuria or flank pain.  She was admitted here on Monday and was discharged on Wednesday and it was not until Thursday when he started with malaise and not feeling good.   ED Course: When the patient came to the ER, BP was 133/101 with heart rate of 166 and otherwise normal vital signs.  Labs reviewed BUN of 24 with otherwise unremarkable BMP.  proBNP negative.  Was 1078 and high sensitive troponin was 24 and later the same.  CBC was unremarkable. EKG as reviewed by me : EKG showed supraventricular cardia with a rate of 165 with incomplete right bundle branch block, and minimal voltage criteria for LVH and Q waves anteroseptally with T wave inversion laterally.  It also showed intraventricular conduction delay and widened QRS complexes.. Imaging: Portable chest x-ray showed evidence of previous median sternotomy/mitral valve replacement with no acute cardiopulmonary disease.   The patient was given 5 mg of IV Lopressor  converting her to normal sinus rhythm.  She will be admitted to an observation progressive unit bed for further evaluation and management.    Colleen Williamson  FMW:[id number] DOB: Feb 06, 1946 DOA: 06/23/2024 PCP: Auston Reyes BIRCH, MD   Assessment & Plan:   Principal Problem:   PSVT (paroxysmal supraventricular tachycardia) Active Problems:   Paroxysmal atrial fibrillation (HCC)   Shortness of breath   Dyslipidemia   Anxiety and depression   Atrial fibrillation with RVR (HCC)  Assessment and Plan:  Chronic a.fib/flutter: as per cardio. W/ RVR.  Poorly controlled w/ hx of multiple cardioversions and ablations as per pt. D/c tikosyn . D/c IV amio drip and then restarted IV amio drip back for HR increasing to 170s w/ ambulating and wide complex tachycardia as per cardio on 06/26/24.  S/p successful cardioversion to sinus rhythm on 06/28/24. D/c IV amio drip, po metoprolol  and start po amio as per cardio. Continue on eliquis     Depression: severity unknown. Pt's daughter requested for the pt to stop taking bupirone for now  Anxiety: severity unknown. Continue on home dose of klonopin      HLD: continue on statin      DVT prophylaxis: eliquis  Code Status: full  Family Communication: discussed pt's care w/ pt's family at bedside and answered their questions  Disposition Plan: likely d/c back home  Level of care: Progressive  Status is: Observation Status is: Inpatient Remains inpatient appropriate because: severity of illness      Consultants:  Cardio  EP  Procedures:   Antimicrobials:   Subjective: Pt c/o fatigue   Objective: Vitals:   06/28/24 1320 06/28/24 1330 06/28/24 1345 06/28/24 1411  BP: 105/70 102/67 120/81 117/83  Pulse: 61 60 63 63  Resp: 13 14 16    Temp:  99 F (37.2 C)  98 F (36.7 C)  TempSrc:  Temporal  Oral  SpO2: 99% 99% 98% 99%  Weight:      Height:        Intake/Output Summary (Last 24 hours) at 06/28/2024 1507 Last data filed at 06/28/2024 0600 Gross per 24 hour  Intake 205 ml  Output 400 ml  Net -195 ml   Filed Weights   06/24/24 1814  Weight: 61 kg    Examination:  General exam: appears comfortable  Respiratory system: clear breath sounds b/l  Cardiovascular system: S1/S2+. No rubs or gallops  Gastrointestinal system: abd is soft, NT, ND & hypoactive bowel sounds  Central nervous system: alert & oriented. Moves all extremities  Psychiatry: judgement and insight appears at baseline. Appropriate mood and affect    Data Reviewed: I have personally reviewed following labs  and imaging studies  CBC: Recent Labs  Lab 06/23/24 2128 06/24/24 0533  WBC 6.6 4.7  HGB 15.0 13.0  HCT 47.0* 41.5  MCV 90.7 90.8  PLT 183 148*   Basic Metabolic Panel: Recent Labs  Lab 06/24/24 0533 06/25/24 0512 06/26/24 0437 06/27/24 0507 06/28/24 0445  NA 141 142 142 140 140  K 3.5 3.4* 3.6 3.9 3.8  CL 110 107 105 104 104  CO2 24 26 28 26 26   GLUCOSE 86 83 82 86 77  BUN 19 16 17 19 19   CREATININE 0.55 0.60 0.74 0.68 0.70  CALCIUM  8.6* 8.7* 9.2 9.2 9.0  MG 1.9  --   --   --  2.0   GFR: Estimated Creatinine Clearance: 57.6 mL/min (by C-G formula based on SCr of 0.7 mg/dL). Liver Function Tests: No results for input(s): AST, ALT, ALKPHOS, BILITOT, PROT, ALBUMIN in the last 168 hours.  No results for input(s): LIPASE, AMYLASE in the last 168 hours. No results for input(s): AMMONIA in the last 168 hours. Coagulation Profile: No results for input(s): INR, PROTIME in the last 168 hours. Cardiac Enzymes: No results for input(s): CKTOTAL, CKMB, CKMBINDEX, TROPONINI in the last 168 hours. BNP (last 3 results) Recent Labs    06/19/24 0956 06/23/24 2128  PROBNP 975.0* 1,078.0*   HbA1C: No results for input(s): HGBA1C in the last 72 hours. CBG: No results for input(s): GLUCAP in the last 168 hours. Lipid Profile: No results for input(s): CHOL, HDL, LDLCALC, TRIG, CHOLHDL, LDLDIRECT in the last 72 hours. Thyroid  Function Tests: Recent Labs    06/26/24 0437  TSH 1.490  FREET4 1.63*   Anemia Panel: No results for input(s): VITAMINB12, FOLATE, FERRITIN, TIBC, IRON, RETICCTPCT in the last 72 hours. Sepsis Labs: No results for input(s): PROCALCITON, LATICACIDVEN in the last 168 hours.  No results found for this or any previous visit (from the past 240 hours).       Radiology Studies: No results found.       Scheduled Meds:  amiodarone   200 mg Oral BID   Followed by   NOREEN ON  07/12/2024] amiodarone   200 mg Oral Daily   apixaban   5 mg Oral BID   clonazePAM   0.5 mg Oral QHS   docusate sodium   200 mg Oral BID   potassium chloride   40 mEq Oral Daily   rosuvastatin   10 mg Oral Daily   Continuous Infusions:     LOS: 4 days      Anthony CHRISTELLA Pouch, MD [facility] Pager [phone]  If 7PM-7AM, please contact night-coverage [email or web address] 06/28/2024, 3:07 PM

## 2024-06-28 NOTE — Anesthesia Preprocedure Evaluation (Addendum)
 Anesthesia Evaluation  Patient identified by MRN, date of birth, ID band Patient awake    Reviewed: Allergy & Precautions, H&P , NPO status , Patient's Chart, lab work & pertinent test results  History of Anesthesia Complications Negative for: history of anesthetic complications  Airway Mallampati: I  TM Distance: >3 FB Neck ROM: Full    Dental  (+) Teeth Intact, Dental Advisory Given   Pulmonary former smoker   breath sounds clear to auscultation       Cardiovascular hypertension, (-) CAD + dysrhythmias Atrial Fibrillation + Valvular Problems/Murmurs  Rhythm:Regular Rate:Normal  10/2022 ECHO:  NORMAL LEFT VENTRICULAR SYSTOLIC FUNCTION  NORMAL RIGHT VENTRICULAR SYSTOLIC FUNCTION  MILD VALVULAR REGURGITATION (See above)  NO VALVULAR STENOSIS    Hx of Mvr 2015   Neuro/Psych  PSYCHIATRIC DISORDERS Anxiety Depression    CVA    GI/Hepatic negative GI ROS, Neg liver ROS,,,  Endo/Other  negative endocrine ROS    Renal/GU negative Renal ROS  negative genitourinary   Musculoskeletal negative musculoskeletal ROS (+)    Abdominal Normal abdominal exam  (+)   Peds negative pediatric ROS (+)  Hematology negative hematology ROS (+) Eliquis    Anesthesia Other Findings Per Cardiology; #Atrial fibrillation and flutter Highly symptomatic Agree with continuing amiodarone  drip until cardioversion. Continue anticoagulation with Eliquis  twice daily for stroke prophylaxis   #Chest pain Recommend CTA coronary after cardioversion to further evaluate   Reproductive/Obstetrics negative OB ROS                              Anesthesia Physical Anesthesia Plan  ASA: 4  Anesthesia Plan: General   Post-op Pain Management:    Induction: Intravenous  PONV Risk Score and Plan: 0 and Propofol  infusion and Treatment may vary due to age or medical condition  Airway Management Planned: Natural  Airway  Additional Equipment:   Intra-op Plan:   Post-operative Plan:   Informed Consent: I have reviewed the patients History and Physical, chart, labs and discussed the procedure including the risks, benefits and alternatives for the proposed anesthesia with the patient or authorized representative who has indicated his/her understanding and acceptance.     Dental advisory given  Plan Discussed with: CRNA  Anesthesia Plan Comments:          Anesthesia Quick Evaluation

## 2024-06-28 NOTE — Anesthesia Postprocedure Evaluation (Signed)
 Anesthesia Post Note  Patient: Colleen Williamson  Procedure(s) Performed: CARDIOVERSION  Patient location during evaluation: Specials Recovery Anesthesia Type: General Level of consciousness: awake and alert Pain management: pain level controlled Vital Signs Assessment: post-procedure vital signs reviewed and stable Respiratory status: spontaneous breathing, nonlabored ventilation and respiratory function stable Cardiovascular status: blood pressure returned to baseline and stable Postop Assessment: no apparent nausea or vomiting Anesthetic complications: no   No notable events documented.   Last Vitals:  Vitals:   06/28/24 1345 06/28/24 1411  BP: 120/81 117/83  Pulse: 63 63  Resp: 16   Temp:  36.7 C  SpO2: 98% 99%    Last Pain:  Vitals:   06/28/24 1411  TempSrc: Oral  PainSc:                  [name]

## 2024-06-28 NOTE — Anesthesia Procedure Notes (Signed)
 Procedure Name: MAC Date/Time: 06/28/2024 1:12 PM  Performed by: Lacretia Camelia NOVAK, CRNAPre-anesthesia Checklist: Patient identified, Emergency Drugs available, Patient being monitored and Suction available Patient Re-evaluated:Patient Re-evaluated prior to induction Oxygen Delivery Method: Nasal cannula

## 2024-06-28 NOTE — Progress Notes (Cosign Needed)
  Patient Name: Colleen Williamson Date of Encounter: 06/28/2024  Primary Cardiologist: Timothy Gollan, MD Electrophysiologist: OLE ONEIDA HOLTS, MD  Interval Summary   Colleen Williamson.  Continued to have intermittent tachypalpitations and dizziness w ambulating when in aflutter.  NPO for DCCV earlier this afternoon, now in sinus with rare PVCs.   Feels better since DCCV.   Vital Signs    Vitals:   06/28/24 1317 06/28/24 1320 06/28/24 1330 06/28/24 1345  BP:  105/70 102/67 120/81  Pulse: 73 61 60 63  Resp: 13 13 14 16   Temp:   99 F (37.2 C)   TempSrc:   Temporal   SpO2: 99% 99% 99% 98%  Weight:      Height:        Intake/Output Summary (Last 24 hours) at 06/28/2024 1351 Last data filed at 06/28/2024 0600 Gross per 24 hour  Intake 205 ml  Output 400 ml  Net -195 ml   Filed Weights   06/24/24 1814  Weight: 61 kg    Physical Exam    GEN- NAD, Alert and oriented  Lungs- Clear to ausculation bilaterally, normal work of breathing Cardiac- Regular rate and rhythm, no murmurs, rubs or gallops GI- soft, NT, ND, + BS Extremities- no clubbing or cyanosis. No edema  Telemetry    Largely 2:1 aflutter with rates 80s, periods of 1:1 aflutter w aberrancy with rates 170s w (personally reviewed)  Hospital Course    Colleen Williamson is a 78 y.o. female with  PMH of MVR (2016) c/b CVA, afib, aflutter, HTN, SVT, anxiety/depression admitted for recurrent atrial flutter  Assessment & Plan    #) aflutter #) SVT S/p DCCV earlier today with conversion to sinus rhythm.  Will transition IV amio to PO - 200mg  BID for today.  Will discuss outpatient PO amio regimen with pharmacy tomorrow prior to discharge Recommended patient stay overnight tonight to monitor heart rhythm, patient is agreeable to this Continue 5mg  eliquis  BID for stroke ppx Keep K > 4, Mag > 2       For questions or updates, please contact [facility] Please consult [email or web address] for contact info under      Signed, Suzann Riddle, NP  06/28/2024, 1:51 PM    I have seen the patient via virtual consultation today.  The patient expressed their consent to participate in today's virtual consult. The patient was located at [facility] while I was located at [facility].  I have examined the patient and reviewed the above assessment and plan.    Interval: Patient seen with video conference call after having had cardioversion.  Maintaining sinus rhythm. Reported feeling relatively well.  No new or acute complaints.  General: Well developed, in no acute distress.  Resp: Normal work of breathing.  Neuro: No gross focal deficits.  Psych: Normal affect.   Assessment and Plan:  #AF #High risk med monitoring - amiodarone  -Continue amiodarone  as above. -Continue OAC. -Outpatient follow up.  Caregility video conferencing was used to complete today's consult.   Total time of encounter: 15 minutes total time of encounter, including face-to-face patient care, coordination of care and counseling regarding high complexity medical decision making re: atrial fibrillation/flutter.  [id number] - Follow up inpatient consult limited 15 min -GT modifier - via interactive audio and video telecommunication systems   Fonda Kitty, MD 06/29/2024 9:21 PM

## 2024-06-28 NOTE — Plan of Care (Signed)
  Problem: Clinical Measurements: Goal: Respiratory complications will improve Outcome: Progressing   Problem: Activity: Goal: Risk for activity intolerance will decrease Outcome: Progressing   Problem: Elimination: Goal: Will not experience complications related to bowel motility Outcome: Progressing   Problem: Pain Managment: Goal: General experience of comfort will improve and/or be controlled Outcome: Progressing   Problem: Safety: Goal: Ability to remain free from injury will improve Outcome: Progressing

## 2024-06-29 ENCOUNTER — Encounter: Payer: Self-pay | Admitting: Cardiology

## 2024-06-29 ENCOUNTER — Encounter: Payer: Self-pay | Admitting: Cardiovascular Disease

## 2024-06-29 DIAGNOSIS — Z8673 Personal history of transient ischemic attack (TIA), and cerebral infarction without residual deficits: Secondary | ICD-10-CM

## 2024-06-29 DIAGNOSIS — I471 Supraventricular tachycardia, unspecified: Secondary | ICD-10-CM | POA: Diagnosis not present

## 2024-06-29 DIAGNOSIS — E876 Hypokalemia: Secondary | ICD-10-CM

## 2024-06-29 DIAGNOSIS — I480 Paroxysmal atrial fibrillation: Secondary | ICD-10-CM | POA: Diagnosis not present

## 2024-06-29 DIAGNOSIS — I10 Essential (primary) hypertension: Secondary | ICD-10-CM | POA: Diagnosis not present

## 2024-06-29 MED ORDER — METOPROLOL TARTRATE 25 MG PO TABS: 25.0000 mg | ORAL_TABLET | Freq: Two times a day (BID) | ORAL | 0 refills | Status: DC | PRN

## 2024-06-29 MED ORDER — METOPROLOL TARTRATE 25 MG PO TABS: 25.0000 mg | ORAL_TABLET | Freq: Two times a day (BID) | ORAL | 0 refills | Status: AC | PRN

## 2024-06-29 MED ORDER — METOPROLOL TARTRATE 25 MG PO TABS: 25.0000 mg | ORAL_TABLET | Freq: Two times a day (BID) | ORAL | Status: DC | PRN

## 2024-06-29 MED ORDER — AMIODARONE HCL 200 MG PO TABS: ORAL_TABLET | ORAL | 0 refills | Status: DC

## 2024-06-29 NOTE — Discharge Summary (Addendum)
 Physician Discharge Summary   Patient: Colleen Williamson MRN: [id number] DOB: January 28, 1946  Admit date:     06/23/2024  Discharge date: 06/29/24  Discharge Physician: Elidia Sieving Azhar Yogi   PCP: Colleen Reyes BIRCH, MD   Recommendations at discharge:    Patient will continue taking amiodarone  oral load of 200 mg bid for 7 days, and then will reduce dose to 200 mg daily for maintenance dose.  Metoprolol  changed to as needed 25 mg in case of heart rate 120 bpm and palpitations, can repeat dose 30 minutes later, if continue symptomatic tachycardia. If persistent symptoms after 2 doses to call medical provider.  Patient not longed taking clonazepam  or buspirone .  Follow up renal function and electrolytes as outpatient in 7 days,  Follow up with Dr Colleen in 7 to 10 days Follow up with Cardiology as scheduled.   I spoke with patient's dughter at the bedside, we talked in detail about patient's condition, plan of care and prognosis and all questions were addressed.   Discharge Diagnoses: Principal Problem:   PSVT (paroxysmal supraventricular tachycardia) Active Problems:   Paroxysmal atrial fibrillation (HCC)   Shortness of breath   Dyslipidemia   Anxiety and depression   Atrial fibrillation with RVR (HCC)  Resolved Problems:   * No resolved hospital problems. [facility] Course: Colleen Williamson was admitted to the hospital with the working diagnosis of atrial flutter with rapid ventricular response.   78 yo female with the past medical history of paroxysmal atrial fibrillation/ flutter, PSVT, history of CVA and anxiety who presented with palpitations. She reported sudden onset of palpitations with no chest pain, severe symptoms that prompted her to come back to the hospital.  Recent hospitalization 11/24 to 06/21/24 for atrial fibrillation with RVR, patient underwent direct current cardioversion and was discharged on dofetilide . Plan for further transition from dofetilide  to amiodarone  as  outpatient.   On her initial physical examination her blood pressure was 133/101, HR 166,  RR 10 and 02 saturation 99%  Lungs with no wheezing or rhonchi, heart with S1 and S2 present, regular with no gallops or rubs, abdomen with no distention and no lower extremity edema.   Na 144, K 3,6 Cl 107 bicarbonate 28 glucose 75 bun 24 cr 0,73  BNP 1,078  High sensitive troponin 24 and 24 Wbc 6.6 hgb 15 plt 183   EKG 165 bpm, left axis deviation, qtc 513, right bundle branch block, supra ventricular tachycardia, with ST depression lead I, aVL and V6, no significant T wave changes.   Patient was placed on IV amiodarone  drip, converting to sinus rhythm.  Transitioned to po amiodarone  with good toleration,   Assessment and Plan: * PSVT (paroxysmal supraventricular tachycardia) Patient remained on sinus rhythm during her hospitalization.  She received amiodarone  IV load with good toleration, at home she will continue with oral amiodarone  load 200 mg bid for one week and then continue with once daily for maintenance.  Metoprolol  changed to 25 mg as needed for tachycardia (heart rate 120 bpm) and palpitations.  Take up to 50 mg if needed.   Elevated high sensitive troponin due to tachycardia, acute coronary syndrome has been ruled out   Paroxysmal atrial fibrillation (HCC) Dofetilide  was discontinued and patient has been placed on amiodarone  with good toleration.  At the time of discharge her telemetry shows sinus rhythm with rate of 60 bpm.   Plan to continue amiodarone  for rate and rhythm control Continue anticoagulation with apixaban .  Follow up as outpatient.  Essential hypertension Blood pressure remained stable.  Plan to follow up as outpatient.  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   Hypokalemia Patient had electrolytes corrected, at the time of her discharge her serum cr is 0,70 with K at 3,8 and serum bicarbonate at 26. Na 140 and Mg 2.0   Plan to continue follow up renal function and electrolytes as outpatient.   History of CVA (cerebrovascular accident) Continue blood pressure monitoring, statin and anticoagulation with apixaban .   Dyslipidemia Plan to continue statin therapy   Anxiety and depression Patient not longer taking clonazepam  or buspirone .   Consultants: Cardiology  Procedures performed: none   Disposition: Home Diet recommendation:  Cardiac diet DISCHARGE MEDICATION: Allergies as of 06/29/2024       Reactions   Flecainide     Funny taste; hairloss   Sulfa Antibiotics Nausea Only        Medication List     STOP taking these medications    busPIRone  10 MG tablet Commonly known as: BUSPAR    clonazePAM  0.5 MG tablet Commonly known as: KLONOPIN    dofetilide  250 MCG capsule Commonly known as: TIKOSYN        TAKE these medications    amiodarone  200 MG tablet Commonly known as: PACERONE  Take 1 tablet (200 mg total) by mouth 2 (two) times daily for 7 days, THEN 1 tablet (200 mg total) daily. Start taking on: June 29, 2024   amoxicillin 500 MG capsule Commonly known as: AMOXIL Take 2,000 mg by mouth See admin instructions. Take 4 capsules (2000 mg) by mouth 1 hour prior to dental appointments.   apixaban  5 MG Tabs tablet Commonly known as: Eliquis  Take 1 tablet (5 mg total) by mouth 2 (two) times daily.   ipratropium 0.03 % nasal spray Commonly known as: ATROVENT Place 1 spray into both nostrils daily as needed (allergies/congestion.).   Melatonin 10 MG Tabs Take 10 mg by mouth at bedtime.   metoprolol  tartrate 25 MG tablet Commonly known as: LOPRESSOR  Take 1 tablet (25 mg total) by mouth 2 (two) times daily as needed (for elevated  heart rate (120 beats per minute) as instructed.). What changed:  medication strength how much to take when to take this reasons to take this   potassium chloride  10 MEQ tablet Commonly known as: KLOR-CON  M Take 1 tablet (10 mEq total) by mouth daily.   rosuvastatin  10 MG tablet Commonly known as: CRESTOR  Take 10 mg by mouth in the morning.        Discharge Exam: Filed Weights   06/24/24 1814  Weight: 61 kg   BP (!) 135/91 (BP Location: Right Arm)   Pulse 67   Temp 97.9 F (36.6 C)   Resp 16   Ht 5' 8 (1.727 m)   Wt 61 kg   SpO2 100%   BMI 20.45 kg/m   Patient is feeling well, has no chest pain and no dyspnea, no palpitations.   Neurology awake and alert ENT with mild pallor with no icterus Cardiovascular with S1 and S2 present and regular, with no gallops, rubs or murmurs Respiratory with no rales or wheezing, no rhonchi Abdomen with no distention, soft and non tender No lower extremity edema   Condition at discharge: stable  The results of significant  diagnostics from this hospitalization (including imaging, microbiology, ancillary and laboratory) are listed below for reference.   Imaging Studies: DG Chest Portable 1 View Result Date: 06/23/2024 CLINICAL DATA:  Palpitations. EXAM: PORTABLE CHEST 1 VIEW COMPARISON:  June 19, 2024 FINDINGS: Multiple sternal wires are present. The heart size and mediastinal contours are within normal limits. There is moderate severity calcification of the aortic arch. 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 The lungs are hyperinflated. No acute infiltrate, pleural effusion or pneumothorax is identified. The visualized skeletal structures are unremarkable. IMPRESSION: 1. 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 2. No acute or active cardiopulmonary disease. Electronically Signed   By: Suzen Dials M.D.   On: 06/23/2024 21:54   ECHOCARDIOGRAM COMPLETE Result Date: 06/20/2024    ECHOCARDIOGRAM REPORT    Patient Name:   Colleen Williamson Sugg Date of Exam: 06/20/2024 Medical Rec #:  [id number]        Height:       68.0 in Accession #:    [id number]       Weight:       134.0 lb Date of Birth:  06-08-1946        BSA:          1.724 m Patient Age:    78 years         BP:           106/69 mmHg Patient Gender: F                HR:           95 bpm. Exam Location:  [facility] Procedure: 2D Echo, Cardiac Doppler and Color Doppler (Both Spectral and Color            Flow Doppler were utilized during procedure). Indications:     Atrial Fibrillation I48.91  History:         Patient has prior history of Echocardiogram examinations, most                  recent 11/16/2023. Prior Cardiac Surgery, Mitral Valve Disease                  and Mitral Valve Replacement; Arrythmias:Atrial Fibrillation.  Sonographer:     Ashley McNeely-Sloane Referring Phys:  [id number] FLYJFFJI A ARIDA Diagnosing Phys: Lonni Hanson MD IMPRESSIONS  1. Left ventricular ejection fraction, by estimation, is 55 to 60%. The left ventricle has normal function. The left ventricle has no regional wall motion abnormalities. Left ventricular diastolic function could not be evaluated.  2. Right ventricular systolic function is normal. The right ventricular size is normal.  3. The mitral valve has been repaired/replaced. Mild mitral valve regurgitation. No evidence of mitral stenosis.  4. The aortic valve is tricuspid. Aortic valve regurgitation is mild to moderate. No aortic stenosis is present.  5. The inferior vena cava is normal in size with greater than 50% respiratory variability, suggesting right atrial pressure of 3 mmHg. FINDINGS  Left Ventricle: Left ventricular ejection fraction, by estimation, is 55 to 60%. The left ventricle has normal function. The left ventricle has no regional wall motion abnormalities. The left ventricular internal cavity size was normal in size. There is  borderline left ventricular hypertrophy. Left ventricular diastolic function could not be  evaluated due to mitral valve repair. Left ventricular diastolic function could not be evaluated. Right Ventricle: The right ventricular size is normal. No increase in right ventricular wall thickness. Right ventricular systolic function is normal. Left Atrium: Left  atrial size was normal in size. Right Atrium: Right atrial size was normal in size. Pericardium: There is no evidence of pericardial effusion. Mitral Valve: The mitral valve has been repaired/replaced. There is moderate thickening of the mitral valve leaflet(s). Mild mitral valve regurgitation. There is a prosthetic annuloplasty ring present in the mitral position. No evidence of mitral valve stenosis. MV peak gradient, 3.0 mmHg. The mean mitral valve gradient is 2.0 mmHg. Tricuspid Valve: The tricuspid valve is normal in structure. Tricuspid valve regurgitation is mild. Aortic Valve: The aortic valve is tricuspid. Aortic valve regurgitation is mild to moderate. Aortic regurgitation PHT measures 293 msec. No aortic stenosis is present. Aortic valve mean gradient measures 1.0 mmHg. Aortic valve peak gradient measures 2.2 mmHg. Aortic valve area, by VTI measures 2.76 cm. Pulmonic Valve: The pulmonic valve was grossly normal. Pulmonic valve regurgitation is mild. No evidence of pulmonic stenosis. Aorta: The aortic root and ascending aorta are structurally normal, with no evidence of dilitation. Pulmonary Artery: The pulmonary artery is of normal size. Venous: The inferior vena cava is normal in size with greater than 50% respiratory variability, suggesting right atrial pressure of 3 mmHg. IAS/Shunts: The interatrial septum was not well visualized.  LEFT VENTRICLE PLAX 2D LVIDd:         3.70 cm     Diastology LVIDs:         2.70 cm     LV e' medial:    4.24 cm/s LV PW:         1.10 cm     LV E/e' medial:  23.0 LV IVS:        0.90 cm     LV e' lateral:   12.20 cm/s LVOT diam:     2.20 cm     LV E/e' lateral: 8.0 LV SV:         30 LV SV Index:   17 LVOT Area:      3.80 cm LV IVRT:       67 msec  LV Volumes (MOD) LV vol d, MOD A2C: 39.2 ml LV vol d, MOD A4C: 27.0 ml LV vol s, MOD A2C: 23.1 ml LV vol s, MOD A4C: 14.9 ml LV SV MOD A2C:     16.1 ml LV SV MOD A4C:     27.0 ml LV SV MOD BP:      13.8 ml RIGHT VENTRICLE            IVC RV Basal diam:  3.60 cm    IVC diam: 1.20 cm RV Mid diam:    3.20 cm RV S prime:     6.15 cm/s  PULMONARY VEINS TAPSE (M-mode): 0.7 cm     Systolic Velocity: 66.60 cm/s LEFT ATRIUM             Index        RIGHT ATRIUM           Index LA diam:        3.00 cm 1.74 cm/m   RA Area:     13.10 cm LA Vol (A2C):   35.3 ml 20.48 ml/m  RA Volume:   32.30 ml  18.74 ml/m LA Vol (A4C):   22.9 ml 13.28 ml/m LA Biplane Vol: 29.1 ml 16.88 ml/m  AORTIC VALVE AV Area (Vmax):    2.49 cm AV Area (Vmean):   2.37 cm AV Area (VTI):     2.76 cm AV Vmax:  74.30 cm/s AV Vmean:          51.300 cm/s AV VTI:            0.108 m AV Peak Grad:      2.2 mmHg AV Mean Grad:      1.0 mmHg LVOT Vmax:         48.70 cm/s LVOT Vmean:        32.000 cm/s LVOT VTI:          0.078 m LVOT/AV VTI ratio: 0.72 AI PHT:            293 msec  AORTA Ao Root diam: 3.40 cm Ao Asc diam:  3.50 cm MITRAL VALVE MV Area (PHT): 5.97 cm    SHUNTS MV Area VTI:   1.52 cm    Systemic VTI:  0.08 m MV Peak grad:  3.0 mmHg    Systemic Diam: 2.20 cm MV Mean grad:  2.0 mmHg MV Vmax:       0.87 m/s MV Vmean:      62.3 cm/s MV Decel Time: 127 msec MV E velocity: 97.50 cm/s Lonni Hanson MD Electronically signed by Lonni Hanson MD Signature Date/Time: 06/20/2024/1:43:45 PM    Final    DG Chest Port 1 View Result Date: 06/19/2024 CLINICAL DATA:  Atrial fibrillation. EXAM: PORTABLE CHEST 1 VIEW COMPARISON:  Chest radiograph dated 11/15/2023 FINDINGS: No focal consolidation, pleural effusion or pneumothorax. Stable cardiac silhouette. Median sternotomy wires and cardiac valve repair. No acute osseous pathology. IMPRESSION: No active disease. Electronically Signed   By: Vanetta Chou M.D.    On: 06/19/2024 11:16    Microbiology: Results for orders placed or performed during the hospital encounter of 11/13/23  Resp panel by RT-PCR (RSV, Flu A&B, Covid) Anterior Nasal Swab     Status: None   Collection Time: 11/13/23 10:53 AM   Specimen: Anterior Nasal Swab  Result Value Ref Range Status   SARS Coronavirus 2 by RT PCR NEGATIVE NEGATIVE Final    Comment: (NOTE) SARS-CoV-2 target nucleic acids are NOT DETECTED.  The SARS-CoV-2 RNA is generally detectable in upper respiratory specimens during the acute phase of infection. The lowest concentration of SARS-CoV-2 viral copies this assay can detect is 138 copies/mL. A negative result does not preclude SARS-Cov-2 infection and should not be used as the sole basis for treatment or other patient management decisions. A negative result may occur with  improper specimen collection/handling, submission of specimen other than nasopharyngeal swab, presence of viral mutation(s) within the areas targeted by this assay, and inadequate number of viral copies(<138 copies/mL). A negative result must be combined with clinical observations, patient history, and epidemiological information. The expected result is Negative.  Fact Sheet for Patients:  bloggercourse.com  Fact Sheet for Healthcare Providers:  seriousbroker.it  This test is no t yet approved or cleared by the [place]  FDA and  has been authorized for detection and/or diagnosis of SARS-CoV-2 by FDA under an Emergency Use Authorization (EUA). This EUA will remain  in effect (meaning this test can be used) for the duration of the COVID-19 declaration under Section 564(b)(1) of the Act, 21 U.S.C.section 360bbb-3(b)(1), unless the authorization is terminated  or revoked sooner.       Influenza A by PCR NEGATIVE NEGATIVE Final   Influenza B by PCR NEGATIVE NEGATIVE Final    Comment: (NOTE) The Xpert Xpress SARS-CoV-2/FLU/RSV  plus assay is intended as an aid in the diagnosis of influenza from Nasopharyngeal swab specimens and should not  be used as a sole basis for treatment. Nasal washings and aspirates are unacceptable for Xpert Xpress SARS-CoV-2/FLU/RSV testing.  Fact Sheet for Patients: bloggercourse.com  Fact Sheet for Healthcare Providers: seriousbroker.it  This test is not yet approved or cleared by the [place]  FDA and has been authorized for detection and/or diagnosis of SARS-CoV-2 by FDA under an Emergency Use Authorization (EUA). This EUA will remain in effect (meaning this test can be used) for the duration of the COVID-19 declaration under Section 564(b)(1) of the Act, 21 U.S.C. section 360bbb-3(b)(1), unless the authorization is terminated or revoked.     Resp Syncytial Virus by PCR NEGATIVE NEGATIVE Final    Comment: (NOTE) Fact Sheet for Patients: bloggercourse.com  Fact Sheet for Healthcare Providers: seriousbroker.it  This test is not yet approved or cleared by the [place]  FDA and has been authorized for detection and/or diagnosis of SARS-CoV-2 by FDA under an Emergency Use Authorization (EUA). This EUA will remain in effect (meaning this test can be used) for the duration of the COVID-19 declaration under Section 564(b)(1) of the Act, 21 U.S.C. section 360bbb-3(b)(1), unless the authorization is terminated or revoked.  Performed at [facility], [address] [place], [place] [address]     Labs: CBC: Recent Labs  Lab 06/23/24 2128 06/24/24 0533  WBC 6.6 4.7  HGB 15.0 13.0  HCT 47.0* 41.5  MCV 90.7 90.8  PLT 183 148*   Basic Metabolic Panel: Recent Labs  Lab 06/24/24 0533 06/25/24 0512 06/26/24 0437 06/27/24 0507 06/28/24 0445  NA 141 142 142 140 140  K 3.5 3.4* 3.6 3.9 3.8  CL 110 107 105 104 104  CO2 24 26 28 26 26   GLUCOSE 86 83 82 86  77  BUN 19 16 17 19 19   CREATININE 0.55 0.60 0.74 0.68 0.70  CALCIUM  8.6* 8.7* 9.2 9.2 9.0  MG 1.9  --   --   --  2.0   Liver Function Tests: No results for input(s): AST, ALT, ALKPHOS, BILITOT, PROT, ALBUMIN in the last 168 hours. CBG: No results for input(s): GLUCAP in the last 168 hours.  Discharge time spent: greater than 30 minutes.  Signed: Elidia Toribio Furnace, MD [facility] 06/29/2024

## 2024-06-29 NOTE — Assessment & Plan Note (Signed)
 Blood pressure remained stable.  Plan to follow up as outpatient.  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

## 2024-06-29 NOTE — Hospital Course (Addendum)
 Colleen Williamson was admitted to the hospital with the working diagnosis of atrial flutter with rapid ventricular response.   78 yo female with the past medical history of paroxysmal atrial fibrillation/ flutter, PSVT, history of CVA and anxiety who presented with palpitations. She reported sudden onset of palpitations with no chest pain, severe symptoms that prompted her to come back to the hospital.  Recent hospitalization 11/24 to 06/21/24 for atrial fibrillation with RVR, patient underwent direct current cardioversion and was discharged on dofetilide . Plan for further transition from dofetilide  to amiodarone  as outpatient.   On her initial physical examination her blood pressure was 133/101, HR 166,  RR 10 and 02 saturation 99%  Lungs with no wheezing or rhonchi, heart with S1 and S2 present, regular with no gallops or rubs, abdomen with no distention and no lower extremity edema.   Na 144, K 3,6 Cl 107 bicarbonate 28 glucose 75 bun 24 cr 0,73  BNP 1,078  High sensitive troponin 24 and 24 Wbc 6.6 hgb 15 plt 183   EKG 165 bpm, left axis deviation, qtc 513, right bundle branch block, supra ventricular tachycardia, with ST depression lead I, aVL and V6, no significant T wave changes.   Patient was placed on IV amiodarone  drip, converting to sinus rhythm.  Transitioned to po amiodarone  with good toleration,

## 2024-06-29 NOTE — Progress Notes (Addendum)
  Patient Name: Colleen Williamson Date of Encounter: 06/29/2024  Primary Cardiologist: Timothy Gollan, MD Electrophysiologist: OLE ONEIDA HOLTS, MD  Interval Summary   Continues to have difficultly sleeping, questions if there are any medications to avoid.  Has intermittent very brief palpitations, but overall feels better since DCCV  yesterday  Daughter at bedside   Vital Signs    Vitals:   06/28/24 2038 06/29/24 0051 06/29/24 0516 06/29/24 0812  BP: (!) 104/55 127/68 128/76 (!) 135/91  Pulse: 66 62 64 67  Resp: 18 18 18 16   Temp: 98.2 F (36.8 C) 98.3 F (36.8 C) 98.3 F (36.8 C) 97.9 F (36.6 C)  TempSrc:      SpO2: 100% 98% 98% 100%  Weight:      Height:        Intake/Output Summary (Last 24 hours) at 06/29/2024 9173 Last data filed at 06/28/2024 1853 Gross per 24 hour  Intake 360 ml  Output --  Net 360 ml   Filed Weights   06/24/24 1814  Weight: 61 kg    Physical Exam    GEN- NAD, Alert and oriented, laying in bed Lungs- Clear to ausculation bilaterally, normal work of breathing Cardiac- Regular rate and rhythm, no murmurs, rubs or gallops GI- soft, NT, ND, + BS Extremities- no clubbing or cyanosis. No edema  Telemetry    Sinus rhythm 40-60s (personally reviewed)  Hospital Course    Colleen Williamson is a 78 y.o. female with  PMH of MVR (2016) c/b CVA, afib, aflutter, HTN, SVT, anxiety/depression admitted for recurrent atrial flutter  Assessment & Plan    #) aflutter #) SVT S/p DCCV 12/3 with conversion to sinus rhythm Maintaining sinus rhythm PO amio - continue 200mg  BID x 1 week, then reduce to 200mg  daily Continue 5mg  eliquis  BID for stroke ppx Keep K > 4, Mag > 2   - OK to discharge from EP perspective. Outpatient follow-up has been scheduled   #) difficulty sleeping Recommended she discuss meds with PCP after discharge Ok to take PRN melatonin in the interim      For questions or updates, please contact [facility] Please consult [email or web address] for contact info under     Signed, Metztli Sachdev, NP  06/29/2024, 8:26 AM

## 2024-06-29 NOTE — Assessment & Plan Note (Signed)
 Patient had electrolytes corrected, at the time of her discharge her serum cr is 0,70 with K at 3,8 and serum bicarbonate at 26. Na 140 and Mg 2.0   Plan to continue follow up renal function and electrolytes as outpatient.

## 2024-06-29 NOTE — Assessment & Plan Note (Signed)
 Continue blood pressure monitoring, statin and anticoagulation with apixaban .

## 2024-07-03 NOTE — Progress Notes (Unsigned)
  Electrophysiology Office Follow up Visit Note:    Date:  07/03/2024   ID:  Colleen Williamson, DOB Jul 07, 1946, MRN [id number]  PCP:  Auston Reyes BIRCH, MD  [facility] Cardiologist:  Evalene Lunger, MD  [facility] Electrophysiologist:  OLE ONEIDA HOLTS, MD    Interval History:     Colleen Williamson is a 78 y.o. female who presents for a follow up visit.   The patient was recently hospitalized at [facility] for atrial fibrillation.  Her dofetilide  was stopped and she was transition to amiodarone  with improved control of her arrhythmia.        Past medical, surgical, social and family history were reviewed.  ROS:   Please see the history of present illness.    All other systems reviewed and are negative.  EKGs/Labs/Other Studies Reviewed:    The following studies were reviewed today:          Physical Exam:    VS:  There were no vitals taken for this visit.    Wt Readings from Last 3 Encounters:  06/24/24 134 lb 7.7 oz (61 kg)  06/19/24 134 lb (60.8 kg)  03/29/24 139 lb (63 kg)     GEN: no distress CARD: RRR, No MRG RESP: No IWOB. CTAB.      ASSESSMENT:    No diagnosis found. PLAN:    In order of problems listed above:  #Atrial fibrillation and flutter #High risk medication monitoring-amiodarone  Maintaining sinus rhythm on amiodarone , continue current dose Continue Eliquis   I discussed my upcoming departure from [facility] during today's clinic appointment.  The patient will continue to follow-up with one of my EP partners moving forward.  Follow-up with the EP APP in 6 months   Signed, Ole Holts, MD, [facility], [facility] 07/03/2024 8:15 AM    Electrophysiology  [facility]

## 2024-07-05 ENCOUNTER — Encounter: Payer: Self-pay | Admitting: Cardiology

## 2024-07-05 ENCOUNTER — Ambulatory Visit: Attending: Cardiology | Admitting: Cardiology

## 2024-07-05 VITALS — BP 120/80 | HR 70 | Ht 68.0 in | Wt 131.0 lb

## 2024-07-05 DIAGNOSIS — I4819 Other persistent atrial fibrillation: Secondary | ICD-10-CM | POA: Diagnosis not present

## 2024-07-05 DIAGNOSIS — Z79899 Other long term (current) drug therapy: Secondary | ICD-10-CM

## 2024-07-05 DIAGNOSIS — I483 Typical atrial flutter: Secondary | ICD-10-CM

## 2024-07-05 NOTE — Patient Instructions (Signed)
 Medication Instructions:  Your physician recommends that you continue on your current medications as directed. Please refer to the Current Medication list given to you today.  *If you need a refill on your cardiac medications before your next appointment, please call your pharmacy*  Follow-Up: At [facility], you and your health needs are our priority.  As part of our continuing mission to provide you with exceptional heart care, our providers are all part of one team.  This team includes your primary Cardiologist (physician) and Advanced Practice Providers or APPs (Physician Assistants and Nurse Practitioners) who all work together to provide you with the care you need, when you need it.  Your next appointment:   6 months  Provider:   Suzann Riddle, NP

## 2024-07-12 ENCOUNTER — Ambulatory Visit: Admitting: Cardiology

## 2024-07-17 ENCOUNTER — Telehealth: Payer: Self-pay | Admitting: Cardiology

## 2024-07-17 MED ORDER — AMIODARONE HCL 200 MG PO TABS: 200.0000 mg | ORAL_TABLET | Freq: Every day | ORAL | 2 refills | Status: AC

## 2024-07-17 NOTE — Telephone Encounter (Signed)
 Requested Prescriptions   Signed Prescriptions Disp Refills   amiodarone  (PACERONE ) 200 MG tablet 90 tablet 2    Sig: Take 1 tablet (200 mg total) by mouth daily.    Authorizing Provider: LAMBERT, CAMERON T    Ordering User: WILFRED, Kameran Lallier  C

## 2024-07-17 NOTE — Telephone Encounter (Signed)
" °*  STAT* If patient is at the pharmacy, call can be transferred to refill team.   1. Which medications need to be refilled? (please list name of each medication and dose if known)   amiodarone  (PACERONE ) 200 MG tablet     2. Would you like to learn more about the convenience, safety, & potential cost savings by using the [facility]? no   3. Are you open to using the [facility] (Type [facility].  ). No    4. Which pharmacy/location (including street and city if local pharmacy) is medication to be sent to? [id number] - [place], [place] - [address]     5. Do they need a 30 day or 90 day supply? 30 day    Pt is low on medication   "

## 2024-07-25 ENCOUNTER — Ambulatory Visit: Admitting: Psychiatry

## 2024-08-09 ENCOUNTER — Ambulatory Visit: Admitting: Cardiology
